# Patient Record
Sex: Male | Born: 1940 | Race: White | Hispanic: No | Marital: Married | Smoking: Never smoker
Health system: Southern US, Community
[De-identification: ages and names within clinical notes are randomized; demographics above are authoritative.]

## PROBLEM LIST (undated history)

## (undated) DIAGNOSIS — Z923 Personal history of irradiation: Secondary | ICD-10-CM

## (undated) DIAGNOSIS — C801 Malignant (primary) neoplasm, unspecified: Secondary | ICD-10-CM

## (undated) DIAGNOSIS — E669 Obesity, unspecified: Secondary | ICD-10-CM

## (undated) DIAGNOSIS — E119 Type 2 diabetes mellitus without complications: Secondary | ICD-10-CM

## (undated) DIAGNOSIS — M1990 Unspecified osteoarthritis, unspecified site: Secondary | ICD-10-CM

## (undated) DIAGNOSIS — I2510 Atherosclerotic heart disease of native coronary artery without angina pectoris: Secondary | ICD-10-CM

## (undated) DIAGNOSIS — I10 Essential (primary) hypertension: Secondary | ICD-10-CM

## (undated) DIAGNOSIS — I219 Acute myocardial infarction, unspecified: Secondary | ICD-10-CM

## (undated) DIAGNOSIS — R9431 Abnormal electrocardiogram [ECG] [EKG]: Secondary | ICD-10-CM

## (undated) DIAGNOSIS — G4730 Sleep apnea, unspecified: Secondary | ICD-10-CM

## (undated) DIAGNOSIS — C679 Malignant neoplasm of bladder, unspecified: Secondary | ICD-10-CM

## (undated) DIAGNOSIS — Z9221 Personal history of antineoplastic chemotherapy: Secondary | ICD-10-CM

## (undated) DIAGNOSIS — Z8619 Personal history of other infectious and parasitic diseases: Secondary | ICD-10-CM

## (undated) DIAGNOSIS — E785 Hyperlipidemia, unspecified: Secondary | ICD-10-CM

## (undated) HISTORY — PX: OTHER SURGICAL HISTORY: SHX169

## (undated) HISTORY — PX: HERNIA REPAIR: SHX51

## (undated) HISTORY — DX: Personal history of other infectious and parasitic diseases: Z86.19

## (undated) HISTORY — PX: APPENDECTOMY: SHX54

## (undated) HISTORY — DX: Malignant neoplasm of bladder, unspecified: C67.9

## (undated) HISTORY — PX: ROTATOR CUFF REPAIR: SHX139

---

## 2003-12-06 HISTORY — PX: CARDIAC CATHETERIZATION: SHX172

## 2005-04-24 ENCOUNTER — Ambulatory Visit: Payer: Self-pay | Admitting: Cardiovascular Disease

## 2007-08-13 ENCOUNTER — Inpatient Hospital Stay: Payer: Self-pay | Admitting: Cardiovascular Disease

## 2007-08-13 ENCOUNTER — Other Ambulatory Visit: Payer: Self-pay

## 2008-07-26 DIAGNOSIS — I10 Essential (primary) hypertension: Secondary | ICD-10-CM | POA: Insufficient documentation

## 2008-07-26 DIAGNOSIS — R209 Unspecified disturbances of skin sensation: Secondary | ICD-10-CM | POA: Insufficient documentation

## 2008-07-26 DIAGNOSIS — G9009 Other idiopathic peripheral autonomic neuropathy: Secondary | ICD-10-CM | POA: Insufficient documentation

## 2008-07-31 DIAGNOSIS — N400 Enlarged prostate without lower urinary tract symptoms: Secondary | ICD-10-CM | POA: Insufficient documentation

## 2008-07-31 DIAGNOSIS — I2510 Atherosclerotic heart disease of native coronary artery without angina pectoris: Secondary | ICD-10-CM | POA: Insufficient documentation

## 2008-08-01 DIAGNOSIS — E1129 Type 2 diabetes mellitus with other diabetic kidney complication: Secondary | ICD-10-CM | POA: Insufficient documentation

## 2008-08-18 ENCOUNTER — Ambulatory Visit: Payer: Self-pay | Admitting: Family Medicine

## 2008-08-31 DIAGNOSIS — IMO0002 Reserved for concepts with insufficient information to code with codable children: Secondary | ICD-10-CM | POA: Insufficient documentation

## 2009-06-08 DIAGNOSIS — N529 Male erectile dysfunction, unspecified: Secondary | ICD-10-CM | POA: Insufficient documentation

## 2010-08-30 ENCOUNTER — Ambulatory Visit: Payer: Self-pay | Admitting: Urology

## 2010-11-01 ENCOUNTER — Ambulatory Visit: Payer: Self-pay | Admitting: Gastroenterology

## 2010-11-01 DIAGNOSIS — Z86010 Personal history of colonic polyps: Secondary | ICD-10-CM | POA: Insufficient documentation

## 2010-11-01 LAB — HM COLONOSCOPY

## 2010-11-05 LAB — PATHOLOGY REPORT

## 2010-11-17 DIAGNOSIS — Z923 Personal history of irradiation: Secondary | ICD-10-CM

## 2010-11-17 HISTORY — DX: Personal history of irradiation: Z92.3

## 2010-11-17 HISTORY — PX: OTHER SURGICAL HISTORY: SHX169

## 2011-07-16 ENCOUNTER — Ambulatory Visit: Payer: Self-pay | Admitting: Family Medicine

## 2012-02-04 ENCOUNTER — Ambulatory Visit: Payer: Self-pay | Admitting: Orthopedic Surgery

## 2012-02-26 ENCOUNTER — Ambulatory Visit: Payer: Self-pay | Admitting: Orthopedic Surgery

## 2012-02-26 LAB — BASIC METABOLIC PANEL
Anion Gap: 8 (ref 7–16)
BUN: 17 mg/dL (ref 7–18)
Calcium, Total: 9.2 mg/dL (ref 8.5–10.1)
Co2: 26 mmol/L (ref 21–32)
Creatinine: 0.86 mg/dL (ref 0.60–1.30)
Glucose: 240 mg/dL — ABNORMAL HIGH (ref 65–99)
Osmolality: 289 (ref 275–301)
Potassium: 4 mmol/L (ref 3.5–5.1)

## 2012-02-26 LAB — PROTIME-INR
INR: 0.9
Prothrombin Time: 12.5 secs (ref 11.5–14.7)

## 2012-02-26 LAB — CBC
HGB: 15.2 g/dL (ref 13.0–18.0)
MCV: 89 fL (ref 80–100)
Platelet: 185 10*3/uL (ref 150–440)
RBC: 5.14 10*6/uL (ref 4.40–5.90)

## 2012-03-04 ENCOUNTER — Ambulatory Visit: Payer: Self-pay | Admitting: Orthopedic Surgery

## 2012-10-25 ENCOUNTER — Inpatient Hospital Stay: Payer: Self-pay | Admitting: Internal Medicine

## 2012-10-25 LAB — TROPONIN I
Troponin-I: 0.04 ng/mL
Troponin-I: 0.19 ng/mL — ABNORMAL HIGH
Troponin-I: 0.1 ng/mL — ABNORMAL HIGH

## 2012-10-25 LAB — BASIC METABOLIC PANEL
BUN: 21 mg/dL — ABNORMAL HIGH (ref 7–18)
Creatinine: 0.76 mg/dL (ref 0.60–1.30)
EGFR (African American): >60
EGFR (Non-African Amer.): >60
Glucose: 279 mg/dL — ABNORMAL HIGH (ref 65–99)
Potassium: 4 mmol/L (ref 3.5–5.1)
Sodium: 139 mmol/L (ref 136–145)

## 2012-10-25 LAB — CK TOTAL AND CKMB (NOT AT ARMC)
CK, Total: 156 U/L (ref 35–232)
CK, Total: 117 U/L (ref 35–232)
CK, Total: 95 U/L (ref 35–232)
CK-MB: 7.1 ng/mL — ABNORMAL HIGH (ref 0.5–3.6)
CK-MB: 6.3 ng/mL — ABNORMAL HIGH (ref 0.5–3.6)
CK-MB: 5.8 ng/mL — ABNORMAL HIGH (ref 0.5–3.6)

## 2012-10-25 LAB — CBC
HGB: 15.7 g/dL (ref 13.0–18.0)
MCH: 29.5 pg (ref 26.0–34.0)
MCHC: 33.7 g/dL (ref 32.0–36.0)
Platelet: 177 10*3/uL (ref 150–440)
RBC: 5.33 10*6/uL (ref 4.40–5.90)
WBC: 5.8 10*3/uL (ref 3.8–10.6)

## 2012-10-25 LAB — PROTIME-INR
INR: 1
INR: 1
Prothrombin Time: 13.1 secs (ref 11.5–14.7)

## 2012-10-26 LAB — LIPID PANEL
HDL Cholesterol: 36 mg/dL — ABNORMAL LOW (ref 40–60)
Ldl Cholesterol, Calc: 41 mg/dL (ref 0–100)
Triglycerides: 284 mg/dL — ABNORMAL HIGH (ref 0–200)

## 2012-10-26 LAB — CBC WITH DIFFERENTIAL/PLATELET
Basophil #: 0 10*3/uL (ref 0.0–0.1)
Eosinophil %: 3.1 %
HGB: 14.9 g/dL (ref 13.0–18.0)
Lymphocyte %: 18.3 %
MCH: 29.7 pg (ref 26.0–34.0)
MCHC: 34.1 g/dL (ref 32.0–36.0)
MCV: 87 fL (ref 80–100)
Monocyte %: 11.4 %
Neutrophil %: 66.9 %
Platelet: 168 10*3/uL (ref 150–440)
RBC: 5.01 10*6/uL (ref 4.40–5.90)

## 2012-10-26 LAB — COMPREHENSIVE METABOLIC PANEL
Albumin: 3.3 g/dL — ABNORMAL LOW (ref 3.4–5.0)
Alkaline Phosphatase: 91 U/L (ref 50–136)
Anion Gap: 7 (ref 7–16)
BUN: 13 mg/dL (ref 7–18)
Bilirubin,Total: 0.9 mg/dL (ref 0.2–1.0)
Chloride: 106 mmol/L (ref 98–107)
Co2: 27 mmol/L (ref 21–32)
Creatinine: 0.84 mg/dL (ref 0.60–1.30)
EGFR (Non-African Amer.): >60
Glucose: 160 mg/dL — ABNORMAL HIGH (ref 65–99)
Potassium: 3.9 mmol/L (ref 3.5–5.1)
SGPT (ALT): 38 U/L (ref 12–78)
Sodium: 140 mmol/L (ref 136–145)
Total Protein: 5.9 g/dL — ABNORMAL LOW (ref 6.4–8.2)

## 2012-11-17 DIAGNOSIS — Z9221 Personal history of antineoplastic chemotherapy: Secondary | ICD-10-CM

## 2012-11-17 HISTORY — PX: OTHER SURGICAL HISTORY: SHX169

## 2012-11-17 HISTORY — DX: Personal history of antineoplastic chemotherapy: Z92.21

## 2013-01-11 ENCOUNTER — Emergency Department: Payer: Self-pay | Admitting: Unknown Physician Specialty

## 2013-01-11 LAB — URINALYSIS, COMPLETE
RBC,UR: 11078 /HPF (ref 0–5)
Specific Gravity: 1.031 (ref 1.003–1.030)
WBC UR: 2727 /HPF (ref 0–5)

## 2013-01-11 LAB — CBC
HCT: 41 % (ref 40.0–52.0)
HGB: 13.9 g/dL (ref 13.0–18.0)
MCH: 29.1 pg (ref 26.0–34.0)
MCHC: 33.9 g/dL (ref 32.0–36.0)
MCV: 86 fL (ref 80–100)
RDW: 13.8 % (ref 11.5–14.5)

## 2013-01-11 LAB — BASIC METABOLIC PANEL
Anion Gap: 11 (ref 7–16)
BUN: 23 mg/dL — ABNORMAL HIGH (ref 7–18)
Calcium, Total: 9.2 mg/dL (ref 8.5–10.1)
Creatinine: 1.14 mg/dL (ref 0.60–1.30)
Glucose: 333 mg/dL — ABNORMAL HIGH (ref 65–99)
Osmolality: 296 (ref 275–301)
Sodium: 140 mmol/L (ref 136–145)

## 2013-01-13 LAB — URINE CULTURE

## 2013-01-14 DIAGNOSIS — C61 Malignant neoplasm of prostate: Secondary | ICD-10-CM | POA: Insufficient documentation

## 2013-01-14 DIAGNOSIS — R310 Gross hematuria: Secondary | ICD-10-CM | POA: Insufficient documentation

## 2013-01-14 DIAGNOSIS — R339 Retention of urine, unspecified: Secondary | ICD-10-CM | POA: Insufficient documentation

## 2013-01-14 DIAGNOSIS — R300 Dysuria: Secondary | ICD-10-CM | POA: Insufficient documentation

## 2013-01-16 LAB — CULTURE, BLOOD (SINGLE)

## 2013-01-20 ENCOUNTER — Ambulatory Visit: Payer: Self-pay | Admitting: Urology

## 2013-01-20 LAB — BASIC METABOLIC PANEL
Anion Gap: 4 — ABNORMAL LOW (ref 7–16)
Calcium, Total: 8.8 mg/dL (ref 8.5–10.1)
Co2: 30 mmol/L (ref 21–32)
EGFR (African American): >60
Osmolality: 285 (ref 275–301)

## 2013-01-25 ENCOUNTER — Ambulatory Visit: Payer: Self-pay | Admitting: Urology

## 2013-01-28 DIAGNOSIS — C672 Malignant neoplasm of lateral wall of bladder: Secondary | ICD-10-CM | POA: Insufficient documentation

## 2013-06-13 ENCOUNTER — Ambulatory Visit: Payer: Self-pay | Admitting: Oncology

## 2013-06-16 LAB — COMPREHENSIVE METABOLIC PANEL
Albumin: 3.7 g/dL (ref 3.4–5.0)
Alkaline Phosphatase: 114 U/L (ref 50–136)
Anion Gap: 7 (ref 7–16)
BUN: 19 mg/dL — ABNORMAL HIGH (ref 7–18)
Calcium, Total: 9.4 mg/dL (ref 8.5–10.1)
Chloride: 103 mmol/L (ref 98–107)
Co2: 28 mmol/L (ref 21–32)
Creatinine: 1.15 mg/dL (ref 0.60–1.30)
EGFR (Non-African Amer.): >60
SGOT(AST): 19 U/L (ref 15–37)
SGPT (ALT): 33 U/L (ref 12–78)
Total Protein: 7.3 g/dL (ref 6.4–8.2)

## 2013-06-16 LAB — CBC CANCER CENTER
Basophil %: 0.7 %
Eosinophil %: 5.4 %
HGB: 13.7 g/dL (ref 13.0–18.0)
Lymphocyte %: 26.8 %
MCH: 27.8 pg (ref 26.0–34.0)
MCHC: 34.1 g/dL (ref 32.0–36.0)
Neutrophil %: 56.5 %
Platelet: 227 x10 3/mm (ref 150–440)
RDW: 16.9 % — ABNORMAL HIGH (ref 11.5–14.5)

## 2013-06-17 ENCOUNTER — Ambulatory Visit: Payer: Self-pay | Admitting: Oncology

## 2013-06-23 LAB — COMPREHENSIVE METABOLIC PANEL
Alkaline Phosphatase: 114 U/L (ref 50–136)
BUN: 22 mg/dL — ABNORMAL HIGH (ref 7–18)
Bilirubin,Total: 0.4 mg/dL (ref 0.2–1.0)
Creatinine: 0.98 mg/dL (ref 0.60–1.30)
EGFR (African American): >60
EGFR (Non-African Amer.): >60
Glucose: 253 mg/dL — ABNORMAL HIGH (ref 65–99)
SGPT (ALT): 32 U/L (ref 12–78)
Sodium: 138 mmol/L (ref 136–145)
Total Protein: 6.9 g/dL (ref 6.4–8.2)

## 2013-06-23 LAB — CBC CANCER CENTER
Basophil #: 0 x10 3/mm (ref 0.0–0.1)
Basophil %: 0.6 %
HCT: 38 % — ABNORMAL LOW (ref 40.0–52.0)
HGB: 13.3 g/dL (ref 13.0–18.0)
Lymphocyte #: 1.3 x10 3/mm (ref 1.0–3.6)
MCH: 28.4 pg (ref 26.0–34.0)
Monocyte %: 6.8 %
Neutrophil #: 2.3 x10 3/mm (ref 1.4–6.5)
Neutrophil %: 56.6 %
Platelet: 163 x10 3/mm (ref 150–440)

## 2013-07-07 LAB — CBC CANCER CENTER
Basophil %: 0.6 %
Eosinophil %: 3.3 %
HGB: 12.4 g/dL — ABNORMAL LOW (ref 13.0–18.0)
Monocyte #: 0.5 x10 3/mm (ref 0.2–1.0)
Neutrophil %: 53.9 %
RBC: 4.37 10*6/uL — ABNORMAL LOW (ref 4.40–5.90)

## 2013-07-07 LAB — COMPREHENSIVE METABOLIC PANEL
Anion Gap: 3 — ABNORMAL LOW (ref 7–16)
BUN: 15 mg/dL (ref 7–18)
Bilirubin,Total: 0.2 mg/dL (ref 0.2–1.0)
Calcium, Total: 8.8 mg/dL (ref 8.5–10.1)
Co2: 29 mmol/L (ref 21–32)
EGFR (Non-African Amer.): >60
Glucose: 282 mg/dL — ABNORMAL HIGH (ref 65–99)
Osmolality: 283 (ref 275–301)
Potassium: 4.4 mmol/L (ref 3.5–5.1)
SGOT(AST): 21 U/L (ref 15–37)
SGPT (ALT): 32 U/L (ref 12–78)
Total Protein: 6.4 g/dL (ref 6.4–8.2)

## 2013-07-14 LAB — COMPREHENSIVE METABOLIC PANEL
Alkaline Phosphatase: 116 U/L (ref 50–136)
Anion Gap: 5 — ABNORMAL LOW (ref 7–16)
BUN: 16 mg/dL (ref 7–18)
Calcium, Total: 9.1 mg/dL (ref 8.5–10.1)
Chloride: 101 mmol/L (ref 98–107)
Creatinine: 1.02 mg/dL (ref 0.60–1.30)
EGFR (Non-African Amer.): >60
Glucose: 292 mg/dL — ABNORMAL HIGH (ref 65–99)
Potassium: 4.8 mmol/L (ref 3.5–5.1)
SGOT(AST): 14 U/L — ABNORMAL LOW (ref 15–37)
Sodium: 136 mmol/L (ref 136–145)
Total Protein: 6.1 g/dL — ABNORMAL LOW (ref 6.4–8.2)

## 2013-07-14 LAB — CBC CANCER CENTER
Eosinophil #: 0.1 x10 3/mm (ref 0.0–0.7)
Eosinophil %: 2 %
HCT: 34.7 % — ABNORMAL LOW (ref 40.0–52.0)
HGB: 12.2 g/dL — ABNORMAL LOW (ref 13.0–18.0)
MCH: 28.5 pg (ref 26.0–34.0)
MCHC: 35.2 g/dL (ref 32.0–36.0)
MCV: 81 fL (ref 80–100)
Monocyte #: 0.3 x10 3/mm (ref 0.2–1.0)
Neutrophil %: 71 %
Platelet: 324 x10 3/mm (ref 150–440)
RBC: 4.29 10*6/uL — ABNORMAL LOW (ref 4.40–5.90)
WBC: 5.5 x10 3/mm (ref 3.8–10.6)

## 2013-07-18 ENCOUNTER — Ambulatory Visit: Payer: Self-pay | Admitting: Oncology

## 2013-07-28 LAB — COMPREHENSIVE METABOLIC PANEL
Albumin: 3.2 g/dL — ABNORMAL LOW (ref 3.4–5.0)
Alkaline Phosphatase: 119 U/L (ref 50–136)
Anion Gap: 9 (ref 7–16)
BUN: 14 mg/dL (ref 7–18)
Bilirubin,Total: 0.3 mg/dL (ref 0.2–1.0)
Calcium, Total: 9.1 mg/dL (ref 8.5–10.1)
Chloride: 102 mmol/L (ref 98–107)
Co2: 28 mmol/L (ref 21–32)
Creatinine: 1 mg/dL (ref 0.60–1.30)
EGFR (Non-African Amer.): >60
Glucose: 219 mg/dL — ABNORMAL HIGH (ref 65–99)
Osmolality: 285 (ref 275–301)
Potassium: 4.7 mmol/L (ref 3.5–5.1)
SGOT(AST): 16 U/L (ref 15–37)
Sodium: 139 mmol/L (ref 136–145)
Total Protein: 6.3 g/dL — ABNORMAL LOW (ref 6.4–8.2)

## 2013-07-28 LAB — CBC CANCER CENTER
Basophil #: 0 x10 3/mm (ref 0.0–0.1)
Eosinophil #: 0.1 x10 3/mm (ref 0.0–0.7)
Eosinophil %: 2 %
HGB: 12.1 g/dL — ABNORMAL LOW (ref 13.0–18.0)
Lymphocyte #: 1.6 x10 3/mm (ref 1.0–3.6)
MCH: 28.2 pg (ref 26.0–34.0)
MCHC: 33.6 g/dL (ref 32.0–36.0)
MCV: 84 fL (ref 80–100)
Neutrophil %: 55 %
RBC: 4.28 10*6/uL — ABNORMAL LOW (ref 4.40–5.90)
RDW: 17.7 % — ABNORMAL HIGH (ref 11.5–14.5)

## 2013-08-04 LAB — COMPREHENSIVE METABOLIC PANEL
Albumin: 3.3 g/dL — ABNORMAL LOW (ref 3.4–5.0)
Alkaline Phosphatase: 115 U/L (ref 50–136)
Calcium, Total: 9.4 mg/dL (ref 8.5–10.1)
Co2: 27 mmol/L (ref 21–32)
EGFR (African American): >60
EGFR (Non-African Amer.): >60
Glucose: 208 mg/dL — ABNORMAL HIGH (ref 65–99)
Osmolality: 281 (ref 275–301)
Potassium: 4.4 mmol/L (ref 3.5–5.1)
SGOT(AST): 18 U/L (ref 15–37)
SGPT (ALT): 31 U/L (ref 12–78)
Sodium: 136 mmol/L (ref 136–145)
Total Protein: 6.6 g/dL (ref 6.4–8.2)

## 2013-08-04 LAB — CBC CANCER CENTER
Basophil #: 0 x10 3/mm (ref 0.0–0.1)
Basophil %: 0.8 %
Eosinophil #: 0.1 x10 3/mm (ref 0.0–0.7)
Eosinophil %: 1.7 %
HCT: 35.6 % — ABNORMAL LOW (ref 40.0–52.0)
MCHC: 33.6 g/dL (ref 32.0–36.0)
MCV: 85 fL (ref 80–100)
Monocyte %: 12.7 %
Neutrophil %: 58.2 %
Platelet: 226 x10 3/mm (ref 150–440)

## 2013-08-17 ENCOUNTER — Ambulatory Visit: Payer: Self-pay | Admitting: Oncology

## 2013-08-18 LAB — CBC CANCER CENTER
Basophil %: 0.7 %
Eosinophil #: 0.2 x10 3/mm (ref 0.0–0.7)
Eosinophil %: 3.7 %
HGB: 10.9 g/dL — ABNORMAL LOW (ref 13.0–18.0)
Lymphocyte #: 1.1 x10 3/mm (ref 1.0–3.6)
MCH: 29.6 pg (ref 26.0–34.0)
MCHC: 33.4 g/dL (ref 32.0–36.0)
MCV: 89 fL (ref 80–100)
Monocyte #: 0.9 x10 3/mm (ref 0.2–1.0)
Neutrophil %: 61 %
RDW: 21.7 % — ABNORMAL HIGH (ref 11.5–14.5)
WBC: 5.7 x10 3/mm (ref 3.8–10.6)

## 2013-08-18 LAB — COMPREHENSIVE METABOLIC PANEL
Alkaline Phosphatase: 107 U/L (ref 50–136)
BUN: 16 mg/dL (ref 7–18)
Bilirubin,Total: 0.2 mg/dL (ref 0.2–1.0)
Calcium, Total: 8.2 mg/dL — ABNORMAL LOW (ref 8.5–10.1)
Chloride: 103 mmol/L (ref 98–107)
EGFR (African American): >60
EGFR (Non-African Amer.): >60
SGPT (ALT): 27 U/L (ref 12–78)

## 2013-08-25 LAB — COMPREHENSIVE METABOLIC PANEL
Albumin: 3.2 g/dL — ABNORMAL LOW (ref 3.4–5.0)
Anion Gap: 9 (ref 7–16)
BUN: 13 mg/dL (ref 7–18)
Bilirubin,Total: 0.3 mg/dL (ref 0.2–1.0)
Calcium, Total: 8.3 mg/dL — ABNORMAL LOW (ref 8.5–10.1)
EGFR (African American): >60
EGFR (Non-African Amer.): >60
Glucose: 375 mg/dL — ABNORMAL HIGH (ref 65–99)
Potassium: 4.9 mmol/L (ref 3.5–5.1)
SGPT (ALT): 35 U/L (ref 12–78)
Sodium: 134 mmol/L — ABNORMAL LOW (ref 136–145)
Total Protein: 6.4 g/dL (ref 6.4–8.2)

## 2013-08-25 LAB — CBC CANCER CENTER
Basophil #: 0.1 x10 3/mm (ref 0.0–0.1)
Basophil %: 1.1 %
Eosinophil #: 0.1 x10 3/mm (ref 0.0–0.7)
HGB: 12.3 g/dL — ABNORMAL LOW (ref 13.0–18.0)
Lymphocyte %: 18.6 %
MCHC: 33.7 g/dL (ref 32.0–36.0)
Monocyte #: 0.4 x10 3/mm (ref 0.2–1.0)
Monocyte %: 7.4 %
Neutrophil #: 3.8 x10 3/mm (ref 1.4–6.5)
Neutrophil %: 70.5 %
RDW: 20.9 % — ABNORMAL HIGH (ref 11.5–14.5)
WBC: 5.5 x10 3/mm (ref 3.8–10.6)

## 2013-09-17 ENCOUNTER — Ambulatory Visit: Payer: Self-pay | Admitting: Oncology

## 2013-10-17 ENCOUNTER — Ambulatory Visit: Payer: Self-pay | Admitting: Oncology

## 2013-10-20 LAB — TSH: TSH: 2.28 u[IU]/mL (ref <=5.90)

## 2013-12-19 ENCOUNTER — Encounter (HOSPITAL_COMMUNITY): Payer: Self-pay | Admitting: Pharmacy Technician

## 2013-12-19 NOTE — Progress Notes (Signed)
12-12-2013 Last office visit and EKG from Dr. Humphrey Rolls on chart 12-14-2013 Nuclear stress test  From Dr. Humphrey Rolls on chart 02-02-2015[name], [place]

## 2013-12-20 ENCOUNTER — Encounter (HOSPITAL_COMMUNITY): Payer: Self-pay

## 2013-12-20 ENCOUNTER — Other Ambulatory Visit (HOSPITAL_COMMUNITY): Payer: Self-pay | Admitting: Orthopedic Surgery

## 2013-12-20 NOTE — Patient Instructions (Addendum)
[place]  12/20/2013   Your procedure is scheduled on: Tuesday February 10th  Report to [place] at 73 AM.  Call this number if you have problems the morning of surgery [phone]   Remember: NO VISITORS UNDER AGE 72 PER [place].   Do not eat food :After Midnight.   clear liquids midnight until 735 am day of surgery, then nothing by mouth.   Take these medicines the morning of surgery with A SIP OF WATER: atenolol                                [place]             You may not have any metal on your body including hair pins and piercings  Do not wear jewelry, make-up.  Do not wear lotions, powders, or perfumes. No  Deodorant is to be worn.   Men may shave face and neck.  Do not bring valuables to the hospital. [place].  Contacts, dentures or bridgework may not be worn into surgery.  Leave suitcase in the car. After surgery it may be brought to your room.  For patients admitted to the hospital, checkout time is 11:00 AM the day of discharge.     Please read over the following fact sheets that you were given: [facility] Preparing for surgery sheet, clear liquid sheet, MRSA information, incentive spirometer sheet, blood fact sheet  Call Zelphia Cairo RN pre op nurse if needed [phone]    [place].  PATIENT SIGNATURE___________________________________________  NURSE SIGNATURE_____________________________________________

## 2013-12-20 NOTE — Progress Notes (Signed)
Oncology clearance note dr Grayland Ormond 10-10-2013 on chart ekg 12-12-2013 dr Humphrey Rolls on chart lov note dr Humphrey Rolls cardiology 12-12-2013 on chart Nuclear stress and cardiac clearance note 12-14-2013 dr Humphrey Rolls on chart

## 2013-12-21 ENCOUNTER — Ambulatory Visit (HOSPITAL_COMMUNITY)
Admission: RE | Admit: 2013-12-21 | Discharge: 2013-12-21 | Disposition: A | Discharge status: Home / Self Care | Payer: Medicare PPO | Source: Ambulatory Visit | Attending: Orthopedic Surgery | Admitting: Orthopedic Surgery

## 2013-12-21 ENCOUNTER — Encounter (HOSPITAL_COMMUNITY)
Admission: RE | Admit: 2013-12-21 | Discharge: 2013-12-21 | Disposition: A | Discharge status: Home / Self Care | Payer: Medicare PPO | Source: Ambulatory Visit | Attending: Orthopedic Surgery | Admitting: Orthopedic Surgery

## 2013-12-21 ENCOUNTER — Encounter (HOSPITAL_COMMUNITY): Payer: Self-pay

## 2013-12-21 DIAGNOSIS — I517 Cardiomegaly: Secondary | ICD-10-CM | POA: Insufficient documentation

## 2013-12-21 DIAGNOSIS — X58XXXA Exposure to other specified factors, initial encounter: Secondary | ICD-10-CM | POA: Insufficient documentation

## 2013-12-21 DIAGNOSIS — C61 Malignant neoplasm of prostate: Secondary | ICD-10-CM | POA: Insufficient documentation

## 2013-12-21 DIAGNOSIS — Z01812 Encounter for preprocedural laboratory examination: Secondary | ICD-10-CM | POA: Insufficient documentation

## 2013-12-21 DIAGNOSIS — T17508A Unspecified foreign body in bronchus causing other injury, initial encounter: Secondary | ICD-10-CM

## 2013-12-21 DIAGNOSIS — M1610 Unilateral primary osteoarthritis, unspecified hip: Secondary | ICD-10-CM | POA: Insufficient documentation

## 2013-12-21 DIAGNOSIS — E119 Type 2 diabetes mellitus without complications: Secondary | ICD-10-CM | POA: Insufficient documentation

## 2013-12-21 DIAGNOSIS — I10 Essential (primary) hypertension: Secondary | ICD-10-CM | POA: Insufficient documentation

## 2013-12-21 DIAGNOSIS — T17808A Unspecified foreign body in other parts of respiratory tract causing other injury, initial encounter: Secondary | ICD-10-CM

## 2013-12-21 DIAGNOSIS — I771 Stricture of artery: Secondary | ICD-10-CM | POA: Insufficient documentation

## 2013-12-21 DIAGNOSIS — M169 Osteoarthritis of hip, unspecified: Secondary | ICD-10-CM | POA: Insufficient documentation

## 2013-12-21 DIAGNOSIS — Z01818 Encounter for other preprocedural examination: Secondary | ICD-10-CM | POA: Insufficient documentation

## 2013-12-21 DIAGNOSIS — I252 Old myocardial infarction: Secondary | ICD-10-CM | POA: Insufficient documentation

## 2013-12-21 DIAGNOSIS — T17408A Unspecified foreign body in trachea causing other injury, initial encounter: Secondary | ICD-10-CM | POA: Insufficient documentation

## 2013-12-21 DIAGNOSIS — I2510 Atherosclerotic heart disease of native coronary artery without angina pectoris: Secondary | ICD-10-CM | POA: Insufficient documentation

## 2013-12-21 HISTORY — DX: Personal history of irradiation: Z92.3

## 2013-12-21 HISTORY — DX: Atherosclerotic heart disease of native coronary artery without angina pectoris: I25.10

## 2013-12-21 HISTORY — DX: Malignant (primary) neoplasm, unspecified: C80.1

## 2013-12-21 HISTORY — DX: Abnormal electrocardiogram (ECG) (EKG): R94.31

## 2013-12-21 HISTORY — DX: Personal history of antineoplastic chemotherapy: Z92.21

## 2013-12-21 HISTORY — DX: Sleep apnea, unspecified: G47.30

## 2013-12-21 HISTORY — DX: Essential (primary) hypertension: I10

## 2013-12-21 HISTORY — DX: Type 2 diabetes mellitus without complications: E11.9

## 2013-12-21 HISTORY — DX: Hyperlipidemia, unspecified: E78.5

## 2013-12-21 HISTORY — DX: Acute myocardial infarction, unspecified: I21.9

## 2013-12-21 HISTORY — DX: Obesity, unspecified: E66.9

## 2013-12-21 HISTORY — DX: Unspecified osteoarthritis, unspecified site: M19.90

## 2013-12-21 LAB — ABO/RH: ABO/RH(D): O NEG

## 2013-12-21 LAB — URINALYSIS, ROUTINE W REFLEX MICROSCOPIC
BILIRUBIN URINE: NEGATIVE
Glucose, UA: >1000 mg/dL — AB
Ketones, ur: NEGATIVE mg/dL
Nitrite: POSITIVE — AB
Protein, ur: NEGATIVE mg/dL
Specific Gravity, Urine: 1.022 (ref 1.005–1.030)
UROBILINOGEN UA: 0.2 mg/dL (ref 0.0–1.0)
pH: 7.5 (ref 5.0–8.0)

## 2013-12-21 LAB — CBC
HEMATOCRIT: 43.7 % (ref 39.0–52.0)
HEMOGLOBIN: 14.8 g/dL (ref 13.0–17.0)
MCH: 28.5 pg (ref 26.0–34.0)
MCHC: 33.9 g/dL (ref 30.0–36.0)
MCV: 84.2 fL (ref 78.0–100.0)
PLATELETS: 185 10*3/uL (ref 150–400)
RBC: 5.19 MIL/uL (ref 4.22–5.81)
RDW: 13.2 % (ref 11.5–15.5)
WBC: 6.2 10*3/uL (ref 4.0–10.5)

## 2013-12-21 LAB — URINE MICROSCOPIC-ADD ON

## 2013-12-21 LAB — BASIC METABOLIC PANEL
BUN: 17 mg/dL (ref 6–23)
CALCIUM: 9.4 mg/dL (ref 8.4–10.5)
CHLORIDE: 100 meq/L (ref 96–112)
CO2: 25 meq/L (ref 19–32)
Creatinine, Ser: 0.89 mg/dL (ref 0.50–1.35)
GFR calc Af Amer: >90 mL/min (ref >90)
GFR calc non Af Amer: 84 mL/min — ABNORMAL LOW (ref >90)
Glucose, Bld: 310 mg/dL — ABNORMAL HIGH (ref 70–99)
Potassium: 4.8 mEq/L (ref 3.7–5.3)
SODIUM: 137 meq/L (ref 137–147)

## 2013-12-21 LAB — SURGICAL PCR SCREEN
MRSA, PCR: NEGATIVE
Staphylococcus aureus: NEGATIVE

## 2013-12-21 LAB — PROTIME-INR
INR: 0.92 (ref 0.00–1.49)
Prothrombin Time: 12.2 seconds (ref 11.6–15.2)

## 2013-12-21 LAB — APTT: aPTT: 26 seconds (ref 24–37)

## 2013-12-21 NOTE — H&P (Signed)
TOTAL HIP ADMISSION H&P  Patient is admitted for right total hip arthroplasty, anterior approach.  Subjective:  Chief Complaint:    Right hip OA / pain  HPI: Louis Alexander, 73 y.o. male, has a history of pain and functional disability in the right hip(s) due to arthritis and patient has failed non-surgical conservative treatments for greater than 12 weeks to include NSAID's and/or analgesics and activity modification.  Onset of symptoms was gradual starting 1+ years ago with gradually worsening course since that time.The patient noted no past surgery on the right hip(s).  Patient currently rates pain in the right hip at 8 out of 10 with activity. Patient has worsening of pain with activity and weight bearing, trendelenberg gait, pain that interfers with activities of daily living and pain with passive range of motion. Patient has evidence of periarticular osteophytes and joint space narrowing by imaging studies. This condition presents safety issues increasing the risk of falls.  There is no current active infection.   Risks, benefits and expectations were discussed with the patient.  Risks including but not limited to the risk of anesthesia, blood clots, nerve damage, blood vessel damage, failure of the prosthesis, infection and up to and including death.  Patient understand the risks, benefits and expectations and wishes to proceed with surgery.   D/C Plans:   Home with HHPT  Post-op Meds:    No Rx given  Tranexamic Acid:   Not to be given - CAD  Decadron:    Not to be given - DM  FYI:    Plaviix and ASA post-op  Norco post-op  Past Medical History  Diagnosis Date  . Abnormal EKG   . Coronary artery disease   . Diabetes mellitus without complication   . Obesity   . Hyperlipidemia   . Hypertension   . 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 Sleep apnea     could not tolerate cpap mask  . Arthritis     oa  . Cancer     bladder and prostate  . History of radiation therapy  2012  . History of chemotherapy 2014    Past Surgical History  Procedure Laterality Date  . Cardiac catheterization  12-06-2003  . Lad stent  2000 x1 stent, 2008 x 1 stent, 2012 x 1 stent  . Ptca  2001  . Hernia repair  yrs ago  . Appendectomy  many yrs ago  . Rotator cuff repair Left yrs ago  . Seed implant to prostate with radiation  2012  . Protatectomy and urostomy  2014    No prescriptions prior to admission   Allergies  Allergen Reactions  . Celebrex [Celecoxib]     Bleeding in bladder  . Effient [Prasugrel] Other (See Comments)    Bleeding in bladder    History  Substance Use Topics  . Smoking status: Never Smoker   . Smokeless tobacco: Never Used  . Alcohol Use: Yes     Comment: occasional    No family history on file.   Review of Systems  Constitutional: Negative.   HENT: Negative.   Eyes: Negative.   Respiratory: Negative.   Cardiovascular: Negative.   Gastrointestinal: Negative.   Genitourinary: Negative.   Musculoskeletal: Positive for joint pain.  Skin: Negative.   Neurological: Negative.   Endo/Heme/Allergies: Negative.   Psychiatric/Behavioral: Negative.     Objective:  Physical Exam  Constitutional: He is oriented to person, place, and time. He appears well-developed and well-nourished.  HENT:  Head: Normocephalic and atraumatic.  Mouth/Throat: Oropharynx is clear and moist.  Eyes: Pupils are equal, round, and reactive to light.  Neck: Neck supple. No JVD present. No tracheal deviation present. No thyromegaly present.  Cardiovascular: Normal rate, regular rhythm, normal heart sounds and intact distal pulses.   Respiratory: Effort normal and breath sounds normal. No stridor. No respiratory distress. He has no wheezes.  GI: Soft. There is no tenderness. There is no guarding.  Musculoskeletal:       Right hip: He exhibits decreased range of motion, decreased strength, tenderness and bony tenderness. He exhibits no swelling, no deformity and no  laceration.  Lymphadenopathy:    He has no cervical adenopathy.  Neurological: He is alert and oriented to person, place, and time.  Skin: Skin is warm and dry.  Psychiatric: He has a normal mood and affect.    Vital signs in last 24 hours: Temp:  [98.1 F (36.7 C)] 98.1 F (36.7 C) (02/04 0814) Pulse Rate:  [67] 67 (02/04 0814) Resp:  [18] 18 (02/04 0814) BP: (164)/(86) 164/86 mmHg (02/04 0814) SpO2:  [97 %] 97 % (02/04 0814) Weight:  [98.703 kg (217 lb 9.6 oz)] 98.703 kg (217 lb 9.6 oz) (02/04 4680)   Imaging Review Plain radiographs demonstrate severe degenerative joint disease of the right hip(s). The bone quality appears to be good for age and reported activity level.  Assessment/Plan:  End stage arthritis, right hip(s)  The patient history, physical examination, clinical judgement of the provider and imaging studies are consistent with end stage degenerative joint disease of the right hip(s) and total hip arthroplasty is deemed medically necessary. The treatment options including medical management, injection therapy, arthroscopy and arthroplasty were discussed at length. The risks and benefits of total hip arthroplasty were presented and reviewed. The risks due to aseptic loosening, infection, stiffness, dislocation/subluxation,  thromboembolic complications and other imponderables were discussed.  The patient acknowledged the explanation, agreed to proceed with the plan and consent was signed. Patient is being admitted for inpatient treatment for surgery, pain control, PT, OT, prophylactic antibiotics, VTE prophylaxis, progressive ambulation and ADL's and discharge planning.The patient is planning to be discharged home with home health services.     West Pugh Shaquanna Lycan   PAC  12/21/2013, 1:47 PM

## 2013-12-21 NOTE — Progress Notes (Signed)
bmet results faxed by epic to dr olin 

## 2013-12-24 LAB — URINE CULTURE

## 2013-12-26 NOTE — Progress Notes (Signed)
Left patient message surgery time changed to 1415, npo after midnight except clear liquids midnight until 815 am day of surgerym then npo, arrive 1115 am 12-27-13 [facility] short stay

## 2013-12-27 ENCOUNTER — Encounter (HOSPITAL_COMMUNITY)
Admission: RE | Disposition: A | Discharge status: Home Health | Payer: Self-pay | Source: Ambulatory Visit | Attending: Orthopedic Surgery

## 2013-12-27 ENCOUNTER — Encounter (HOSPITAL_COMMUNITY): Payer: Self-pay | Admitting: *Deleted

## 2013-12-27 ENCOUNTER — Inpatient Hospital Stay (HOSPITAL_COMMUNITY): Payer: Medicare PPO

## 2013-12-27 ENCOUNTER — Inpatient Hospital Stay (HOSPITAL_COMMUNITY)
Admission: RE | Admit: 2013-12-27 | Discharge: 2013-12-28 | DRG: 470 | Disposition: A | Discharge status: Home Health | Payer: Medicare PPO | Source: Ambulatory Visit | Attending: Orthopedic Surgery | Admitting: Orthopedic Surgery

## 2013-12-27 ENCOUNTER — Inpatient Hospital Stay (HOSPITAL_COMMUNITY): Payer: Medicare PPO | Admitting: Anesthesiology

## 2013-12-27 ENCOUNTER — Encounter (HOSPITAL_COMMUNITY): Payer: Medicare PPO | Admitting: Anesthesiology

## 2013-12-27 DIAGNOSIS — M1610 Unilateral primary osteoarthritis, unspecified hip: Principal | ICD-10-CM | POA: Diagnosis present

## 2013-12-27 DIAGNOSIS — G4730 Sleep apnea, unspecified: Secondary | ICD-10-CM | POA: Diagnosis present

## 2013-12-27 DIAGNOSIS — Z923 Personal history of irradiation: Secondary | ICD-10-CM

## 2013-12-27 DIAGNOSIS — I252 Old myocardial infarction: Secondary | ICD-10-CM

## 2013-12-27 DIAGNOSIS — Z6830 Body mass index (BMI) 30.0-30.9, adult: Secondary | ICD-10-CM

## 2013-12-27 DIAGNOSIS — E119 Type 2 diabetes mellitus without complications: Secondary | ICD-10-CM | POA: Diagnosis present

## 2013-12-27 DIAGNOSIS — Z96649 Presence of unspecified artificial hip joint: Secondary | ICD-10-CM

## 2013-12-27 DIAGNOSIS — M169 Osteoarthritis of hip, unspecified: Principal | ICD-10-CM | POA: Diagnosis present

## 2013-12-27 DIAGNOSIS — Z888 Allergy status to other drugs, medicaments and biological substances status: Secondary | ICD-10-CM

## 2013-12-27 DIAGNOSIS — I2510 Atherosclerotic heart disease of native coronary artery without angina pectoris: Secondary | ICD-10-CM | POA: Diagnosis present

## 2013-12-27 DIAGNOSIS — Z9221 Personal history of antineoplastic chemotherapy: Secondary | ICD-10-CM

## 2013-12-27 DIAGNOSIS — I10 Essential (primary) hypertension: Secondary | ICD-10-CM | POA: Diagnosis present

## 2013-12-27 DIAGNOSIS — E785 Hyperlipidemia, unspecified: Secondary | ICD-10-CM | POA: Diagnosis present

## 2013-12-27 DIAGNOSIS — D62 Acute posthemorrhagic anemia: Secondary | ICD-10-CM | POA: Diagnosis not present

## 2013-12-27 DIAGNOSIS — E669 Obesity, unspecified: Secondary | ICD-10-CM | POA: Diagnosis present

## 2013-12-27 HISTORY — PX: TOTAL HIP ARTHROPLASTY: SHX124

## 2013-12-27 LAB — TYPE AND SCREEN
ABO/RH(D): O NEG
ANTIBODY SCREEN: NEGATIVE

## 2013-12-27 LAB — GLUCOSE, CAPILLARY
Glucose-Capillary: 148 mg/dL — ABNORMAL HIGH (ref 70–99)
Glucose-Capillary: 156 mg/dL — ABNORMAL HIGH (ref 70–99)

## 2013-12-27 SURGERY — ARTHROPLASTY, HIP, TOTAL, ANTERIOR APPROACH
Anesthesia: General | Site: Hip | Laterality: Right

## 2013-12-27 MED ORDER — EPHEDRINE SULFATE 50 MG/ML IJ SOLN
INTRAMUSCULAR | Status: AC
  Filled 2013-12-27: qty 1

## 2013-12-27 MED ORDER — NEOSTIGMINE METHYLSULFATE 1 MG/ML IJ SOLN
INTRAMUSCULAR | Status: AC
  Filled 2013-12-27: qty 10

## 2013-12-27 MED ORDER — CEFAZOLIN SODIUM-DEXTROSE 2-3 GM-% IV SOLR
2.0000 g | Freq: Four times a day (QID) | INTRAVENOUS | Status: AC
  Administered 2013-12-27 – 2013-12-28 (×2): 2 g via INTRAVENOUS
  Filled 2013-12-27 (×2): qty 50

## 2013-12-27 MED ORDER — INSULIN ASPART 100 UNIT/ML ~~LOC~~ SOLN
0.0000 [IU] | Freq: Three times a day (TID) | SUBCUTANEOUS | Status: DC
  Administered 2013-12-28: 11 [IU] via SUBCUTANEOUS
  Administered 2013-12-28: 3 [IU] via SUBCUTANEOUS

## 2013-12-27 MED ORDER — HYDROMORPHONE HCL PF 1 MG/ML IJ SOLN
0.5000 mg | INTRAMUSCULAR | Status: DC | PRN
  Administered 2013-12-27: 1 mg via INTRAVENOUS
  Filled 2013-12-27: qty 1

## 2013-12-27 MED ORDER — PROMETHAZINE HCL 25 MG/ML IJ SOLN: 6.2500 mg | INTRAMUSCULAR | Status: DC | PRN

## 2013-12-27 MED ORDER — LACTATED RINGERS IV SOLN
INTRAVENOUS | Status: DC | PRN
  Administered 2013-12-27 (×2): via INTRAVENOUS

## 2013-12-27 MED ORDER — MENTHOL 3 MG MT LOZG
1.0000 | LOZENGE | OROMUCOSAL | Status: DC | PRN
  Filled 2013-12-27: qty 9

## 2013-12-27 MED ORDER — ATORVASTATIN CALCIUM 40 MG PO TABS
40.0000 mg | ORAL_TABLET | Freq: Every evening | ORAL | Status: DC
  Administered 2013-12-27 – 2013-12-28 (×2): 40 mg via ORAL
  Filled 2013-12-27 (×2): qty 1

## 2013-12-27 MED ORDER — KETAMINE HCL 10 MG/ML IJ SOLN
INTRAMUSCULAR | Status: DC | PRN
  Administered 2013-12-27 (×2): 20 mg via INTRAVENOUS
  Administered 2013-12-27: 10 mg via INTRAVENOUS

## 2013-12-27 MED ORDER — FLEET ENEMA 7-19 GM/118ML RE ENEM: 1.0000 | ENEMA | Freq: Once | RECTAL | Status: AC | PRN

## 2013-12-27 MED ORDER — DEXTROSE 5 % IV SOLN
500.0000 mg | Freq: Four times a day (QID) | INTRAVENOUS | Status: DC | PRN
  Filled 2013-12-27: qty 5

## 2013-12-27 MED ORDER — CLOPIDOGREL BISULFATE 75 MG PO TABS
75.0000 mg | ORAL_TABLET | Freq: Every day | ORAL | Status: DC
  Administered 2013-12-28: 75 mg via ORAL
  Filled 2013-12-27 (×3): qty 1

## 2013-12-27 MED ORDER — CHLORHEXIDINE GLUCONATE 4 % EX LIQD: 60.0000 mL | Freq: Once | CUTANEOUS | Status: AC

## 2013-12-27 MED ORDER — CEFAZOLIN SODIUM-DEXTROSE 2-3 GM-% IV SOLR
INTRAVENOUS | Status: AC
  Filled 2013-12-27: qty 50

## 2013-12-27 MED ORDER — HYDROMORPHONE HCL PF 1 MG/ML IJ SOLN: 0.2500 mg | INTRAMUSCULAR | Status: DC | PRN

## 2013-12-27 MED ORDER — PROPOFOL 10 MG/ML IV BOLUS
INTRAVENOUS | Status: AC
  Filled 2013-12-27: qty 20

## 2013-12-27 MED ORDER — POLYETHYLENE GLYCOL 3350 17 G PO PACK
17.0000 g | PACK | Freq: Two times a day (BID) | ORAL | Status: DC
  Administered 2013-12-27 – 2013-12-28 (×2): 17 g via ORAL
  Filled 2013-12-27 (×4): qty 1

## 2013-12-27 MED ORDER — LIDOCAINE HCL (CARDIAC) 20 MG/ML IV SOLN
INTRAVENOUS | Status: DC | PRN
  Administered 2013-12-27: 60 mg via INTRAVENOUS

## 2013-12-27 MED ORDER — BISACODYL 10 MG RE SUPP: 10.0000 mg | Freq: Every day | RECTAL | Status: DC | PRN

## 2013-12-27 MED ORDER — ALUM & MAG HYDROXIDE-SIMETH 200-200-20 MG/5ML PO SUSP: 30.0000 mL | ORAL | Status: DC | PRN

## 2013-12-27 MED ORDER — FERROUS SULFATE 325 (65 FE) MG PO TABS
325.0000 mg | ORAL_TABLET | Freq: Three times a day (TID) | ORAL | Status: DC
Start: 2013-12-27 — End: 2013-12-28
  Administered 2013-12-27 – 2013-12-28 (×4): 325 mg via ORAL
  Filled 2013-12-27 (×5): qty 1

## 2013-12-27 MED ORDER — GLIPIZIDE 10 MG PO TABS
10.0000 mg | ORAL_TABLET | Freq: Every day | ORAL | Status: DC
  Administered 2013-12-28: 10 mg via ORAL
  Filled 2013-12-27 (×2): qty 1

## 2013-12-27 MED ORDER — DIPHENHYDRAMINE HCL 25 MG PO CAPS: 25.0000 mg | ORAL_CAPSULE | Freq: Four times a day (QID) | ORAL | Status: DC | PRN

## 2013-12-27 MED ORDER — HYDROCODONE-ACETAMINOPHEN 7.5-325 MG PO TABS
1.0000 | ORAL_TABLET | ORAL | Status: DC
  Administered 2013-12-27: 1 via ORAL
  Administered 2013-12-28 (×2): 2 via ORAL
  Filled 2013-12-27: qty 1
  Filled 2013-12-27 (×2): qty 2

## 2013-12-27 MED ORDER — PROPOFOL 10 MG/ML IV BOLUS
INTRAVENOUS | Status: DC | PRN
  Administered 2013-12-27: 150 mg via INTRAVENOUS

## 2013-12-27 MED ORDER — ROCURONIUM BROMIDE 100 MG/10ML IV SOLN
INTRAVENOUS | Status: DC | PRN
  Administered 2013-12-27: 50 mg via INTRAVENOUS

## 2013-12-27 MED ORDER — HYDROMORPHONE HCL PF 1 MG/ML IJ SOLN
INTRAMUSCULAR | Status: DC | PRN
  Administered 2013-12-27 (×3): 0.5 mg via INTRAVENOUS

## 2013-12-27 MED ORDER — ATENOLOL 25 MG PO TABS
25.0000 mg | ORAL_TABLET | Freq: Every morning | ORAL | Status: DC
  Administered 2013-12-28: 25 mg via ORAL
  Filled 2013-12-27: qty 1

## 2013-12-27 MED ORDER — MIDAZOLAM HCL 5 MG/5ML IJ SOLN
INTRAMUSCULAR | Status: DC | PRN
  Administered 2013-12-27: 2 mg via INTRAVENOUS

## 2013-12-27 MED ORDER — SODIUM CHLORIDE 0.9 % IJ SOLN
INTRAMUSCULAR | Status: AC
  Filled 2013-12-27: qty 10

## 2013-12-27 MED ORDER — DOCUSATE SODIUM 100 MG PO CAPS
100.0000 mg | ORAL_CAPSULE | Freq: Two times a day (BID) | ORAL | Status: DC
  Administered 2013-12-27 – 2013-12-28 (×2): 100 mg via ORAL
  Filled 2013-12-27 (×3): qty 1

## 2013-12-27 MED ORDER — ZOLPIDEM TARTRATE 5 MG PO TABS: 5.0000 mg | ORAL_TABLET | Freq: Every evening | ORAL | Status: DC | PRN

## 2013-12-27 MED ORDER — ROCURONIUM BROMIDE 100 MG/10ML IV SOLN
INTRAVENOUS | Status: AC
  Filled 2013-12-27: qty 1

## 2013-12-27 MED ORDER — HYDROMORPHONE HCL PF 2 MG/ML IJ SOLN
INTRAMUSCULAR | Status: AC
  Filled 2013-12-27: qty 1

## 2013-12-27 MED ORDER — METOCLOPRAMIDE HCL 5 MG/ML IJ SOLN: 5.0000 mg | Freq: Three times a day (TID) | INTRAMUSCULAR | Status: DC | PRN

## 2013-12-27 MED ORDER — CEFAZOLIN SODIUM-DEXTROSE 2-3 GM-% IV SOLR
2.0000 g | INTRAVENOUS | Status: AC
  Administered 2013-12-27: 2 g via INTRAVENOUS

## 2013-12-27 MED ORDER — METFORMIN HCL 500 MG PO TABS
500.0000 mg | ORAL_TABLET | Freq: Two times a day (BID) | ORAL | Status: DC
  Administered 2013-12-28 (×2): 500 mg via ORAL
  Filled 2013-12-27 (×3): qty 1

## 2013-12-27 MED ORDER — METOCLOPRAMIDE HCL 10 MG PO TABS: 5.0000 mg | ORAL_TABLET | Freq: Three times a day (TID) | ORAL | Status: DC | PRN

## 2013-12-27 MED ORDER — PHENOL 1.4 % MT LIQD: 1.0000 | OROMUCOSAL | Status: DC | PRN

## 2013-12-27 MED ORDER — ASPIRIN EC 81 MG PO TBEC
81.0000 mg | DELAYED_RELEASE_TABLET | Freq: Every day | ORAL | Status: DC
  Administered 2013-12-27 – 2013-12-28 (×2): 81 mg via ORAL
  Filled 2013-12-27 (×2): qty 1

## 2013-12-27 MED ORDER — ONDANSETRON HCL 4 MG PO TABS: 4.0000 mg | ORAL_TABLET | Freq: Four times a day (QID) | ORAL | Status: DC | PRN

## 2013-12-27 MED ORDER — MIDAZOLAM HCL 2 MG/2ML IJ SOLN
INTRAMUSCULAR | Status: AC
  Filled 2013-12-27: qty 2

## 2013-12-27 MED ORDER — 0.9 % SODIUM CHLORIDE (POUR BTL) OPTIME
TOPICAL | Status: DC | PRN
  Administered 2013-12-27: 1000 mL

## 2013-12-27 MED ORDER — ONDANSETRON HCL 4 MG/2ML IJ SOLN
INTRAMUSCULAR | Status: DC | PRN
  Administered 2013-12-27: 2 mg via INTRAVENOUS

## 2013-12-27 MED ORDER — EPHEDRINE SULFATE 50 MG/ML IJ SOLN
INTRAMUSCULAR | Status: DC | PRN
  Administered 2013-12-27: 5 mg via INTRAVENOUS
  Administered 2013-12-27: 10 mg via INTRAVENOUS

## 2013-12-27 MED ORDER — ONDANSETRON HCL 4 MG/2ML IJ SOLN
4.0000 mg | Freq: Four times a day (QID) | INTRAMUSCULAR | Status: DC | PRN
Start: 2013-12-27 — End: 2013-12-28
  Administered 2013-12-27: 4 mg via INTRAVENOUS
  Filled 2013-12-27: qty 2

## 2013-12-27 MED ORDER — FENTANYL CITRATE 0.05 MG/ML IJ SOLN
INTRAMUSCULAR | Status: DC | PRN
  Administered 2013-12-27: 100 ug via INTRAVENOUS
  Administered 2013-12-27 (×3): 50 ug via INTRAVENOUS

## 2013-12-27 MED ORDER — METHOCARBAMOL 500 MG PO TABS: 500.0000 mg | ORAL_TABLET | Freq: Four times a day (QID) | ORAL | Status: DC | PRN

## 2013-12-27 MED ORDER — ONDANSETRON HCL 4 MG/2ML IJ SOLN
INTRAMUSCULAR | Status: AC
  Filled 2013-12-27: qty 2

## 2013-12-27 MED ORDER — LIDOCAINE HCL (CARDIAC) 20 MG/ML IV SOLN
INTRAVENOUS | Status: AC
  Filled 2013-12-27: qty 5

## 2013-12-27 MED ORDER — SODIUM CHLORIDE 0.9 % IV SOLN
100.0000 mL/h | INTRAVENOUS | Status: DC
  Administered 2013-12-27 – 2013-12-28 (×2): 100 mL/h via INTRAVENOUS
  Filled 2013-12-27 (×8): qty 1000

## 2013-12-27 MED ORDER — GLYCOPYRROLATE 0.2 MG/ML IJ SOLN
INTRAMUSCULAR | Status: AC
  Filled 2013-12-27: qty 2

## 2013-12-27 MED ORDER — FENTANYL CITRATE 0.05 MG/ML IJ SOLN
INTRAMUSCULAR | Status: AC
  Filled 2013-12-27: qty 5

## 2013-12-27 SURGICAL SUPPLY — 38 items
BAG ZIPLOCK 12X15 (MISCELLANEOUS) IMPLANT
BLADE SAW SGTL 18X1.27X75 (BLADE) ×2 IMPLANT
CAPT HIP PF MOP ×2 IMPLANT
DERMABOND ADVANCED (GAUZE/BANDAGES/DRESSINGS) ×1
DERMABOND ADVANCED .7 DNX12 (GAUZE/BANDAGES/DRESSINGS) ×1 IMPLANT
DRAPE C-ARM 42X120 X-RAY (DRAPES) ×2 IMPLANT
DRAPE STERI IOBAN 125X83 (DRAPES) ×2 IMPLANT
DRAPE U-SHAPE 47X51 STRL (DRAPES) ×6 IMPLANT
DRSG AQUACEL AG ADV 3.5X10 (GAUZE/BANDAGES/DRESSINGS) ×2 IMPLANT
DRSG TEGADERM 4X4.75 (GAUZE/BANDAGES/DRESSINGS) IMPLANT
DURAPREP 26ML APPLICATOR (WOUND CARE) ×2 IMPLANT
ELECT BLADE TIP CTD 4 INCH (ELECTRODE) ×2 IMPLANT
ELECT REM PT RETURN 9FT ADLT (ELECTROSURGICAL) ×2
ELECTRODE REM PT RTRN 9FT ADLT (ELECTROSURGICAL) ×1 IMPLANT
EVACUATOR 1/8 PVC DRAIN (DRAIN) IMPLANT
FACESHIELD LNG OPTICON STERILE (SAFETY) ×8 IMPLANT
GAUZE SPONGE 2X2 8PLY STRL LF (GAUZE/BANDAGES/DRESSINGS) IMPLANT
GLOVE BIOGEL PI IND STRL 7.5 (GLOVE) ×1 IMPLANT
GLOVE BIOGEL PI IND STRL 8 (GLOVE) ×1 IMPLANT
GLOVE BIOGEL PI INDICATOR 7.5 (GLOVE) ×1
GLOVE BIOGEL PI INDICATOR 8 (GLOVE) ×1
GLOVE ECLIPSE 8.0 STRL XLNG CF (GLOVE) ×2 IMPLANT
GLOVE ORTHO TXT STRL SZ7.5 (GLOVE) ×4 IMPLANT
GOWN SPEC L3 XXLG W/TWL (GOWN DISPOSABLE) ×2 IMPLANT
GOWN STRL REUS W/ TWL XL LVL3 (GOWN DISPOSABLE) ×1 IMPLANT
GOWN STRL REUS W/TWL LRG LVL3 (GOWN DISPOSABLE) ×4 IMPLANT
GOWN STRL REUS W/TWL XL LVL3 (GOWN DISPOSABLE) ×1
KIT BASIN OR (CUSTOM PROCEDURE TRAY) ×2 IMPLANT
PACK TOTAL JOINT (CUSTOM PROCEDURE TRAY) ×2 IMPLANT
PADDING CAST COTTON 6X4 STRL (CAST SUPPLIES) ×2 IMPLANT
SPONGE GAUZE 2X2 STER 10/PKG (GAUZE/BANDAGES/DRESSINGS)
SUT MNCRL AB 4-0 PS2 18 (SUTURE) ×2 IMPLANT
SUT VIC AB 1 CT1 36 (SUTURE) ×6 IMPLANT
SUT VIC AB 2-0 CT1 27 (SUTURE) ×2
SUT VIC AB 2-0 CT1 TAPERPNT 27 (SUTURE) ×2 IMPLANT
SUT VLOC 180 0 24IN GS25 (SUTURE) ×2 IMPLANT
TOWEL OR 17X26 10 PK STRL BLUE (TOWEL DISPOSABLE) ×2 IMPLANT
TOWEL OR NON WOVEN STRL DISP B (DISPOSABLE) ×2 IMPLANT

## 2013-12-27 NOTE — Progress Notes (Signed)
Utilization review completed.  

## 2013-12-27 NOTE — Transfer of Care (Signed)
Immediate Anesthesia Transfer of Care Note  Patient: Louis Alexander  Procedure(s) Performed: Procedure(s): RIGHT TOTAL HIP ARTHROPLASTY ANTERIOR APPROACH (Right)  Patient Location: PACU  Anesthesia Type:General  Level of Consciousness: awake, alert , oriented and patient cooperative  Airway & Oxygen Therapy: Patient Spontanous Breathing and Patient connected to face mask oxygen  Post-op Assessment: Report given to PACU RN, Post -op Vital signs reviewed and stable and Patient moving all extremities  Post vital signs: Reviewed and stable  Complications: No apparent anesthesia complications

## 2013-12-27 NOTE — Progress Notes (Signed)
Portable AP Pelvis and Lateral Right Hip X-rays done. 

## 2013-12-27 NOTE — Transfer of Care (Signed)
Immediate Anesthesia Transfer of Care Note  Patient: Louis Alexander  Procedure(s) Performed: Procedure(s): RIGHT TOTAL HIP ARTHROPLASTY ANTERIOR APPROACH (Right)  Patient Location: PACU  Anesthesia Type:General  Level of Consciousness: sedated  Airway & Oxygen Therapy: Patient Spontanous Breathing and Patient connected to face mask oxygen  Post-op Assessment: Report given to PACU RN and Post -op Vital signs reviewed and stable  Post vital signs: stable  Complications: No apparent anesthesia complications

## 2013-12-27 NOTE — Anesthesia Preprocedure Evaluation (Signed)
Anesthesia Evaluation  Patient identified by MRN, date of birth, ID band Patient awake    Reviewed: Allergy & Precautions, H&P , NPO status , Patient's Chart, lab work & pertinent test results  Airway Mallampati: II TM Distance: >3 FB Neck ROM: Full    Dental no notable dental hx.    Pulmonary sleep apnea ,  breath sounds clear to auscultation  Pulmonary exam normal       Cardiovascular Exercise Tolerance: Good hypertension, Pt. on medications and Pt. on home beta blockers + CAD and + Past MI Rhythm:Regular Rate:Normal  No cardiac symptoms. Dr. Humphrey Rolls in [place].   Neuro/Psych negative neurological ROS  negative psych ROS   GI/Hepatic negative GI ROS, Neg liver ROS,   Endo/Other  diabetes, Type 2, Oral Hypoglycemic Agents  Renal/GU negative Renal ROS  negative genitourinary   Musculoskeletal negative musculoskeletal ROS (+)   Abdominal (+) + obese,   Peds negative pediatric ROS (+)  Hematology negative hematology ROS (+)   Anesthesia Other Findings   Reproductive/Obstetrics negative OB ROS                           Anesthesia Physical Anesthesia Plan  ASA: III  Anesthesia Plan: General   Post-op Pain Management:    Induction: Intravenous  Airway Management Planned: Oral ETT  Additional Equipment:   Intra-op Plan:   Post-operative Plan: Extubation in OR  Informed Consent: I have reviewed the patients History and Physical, chart, labs and discussed the procedure including the risks, benefits and alternatives for the proposed anesthesia with the patient or authorized representative who has indicated his/her understanding and acceptance.   Dental advisory given  Plan Discussed with: CRNA  Anesthesia Plan Comments: (Discussed r/b general versus spinal. Patient prefers general.)        Anesthesia Quick Evaluation

## 2013-12-27 NOTE — Op Note (Signed)
NAME:  Louis Alexander                ACCOUNT NO.: [id number]      MEDICAL RECORD NO.: [id number]      FACILITY:  [facility]      PHYSICIAN:  Paralee Cancel D  DATE OF BIRTH:  May 31, 1941     DATE OF PROCEDURE:  12/27/2013                                 OPERATIVE REPORT         PREOPERATIVE DIAGNOSIS: Right  hip osteoarthritis.      POSTOPERATIVE DIAGNOSIS:  Right hip osteoarthritis.      PROCEDURE:  Right total hip replacement through an anterior approach   utilizing DePuy THR system, component size 63mm pinnacle cup, a size 36+4 neutral   Altrex liner, a size 7 Hi Tri Lock stem with a 36+1.5 Articuleze metal ball.      SURGEON:  Pietro Cassis. Alvan Dame, M.D.      ASSISTANT:  Danae Orleans, PA-C      ANESTHESIA:  General.      SPECIMENS:  None.      COMPLICATIONS:  None.      BLOOD LOSS:  400 cc     DRAINS:  None.      INDICATION OF THE PROCEDURE:  Louis Alexander is a 73 y.o. male who had   presented to office for evaluation of right hip pain.  Radiographs revealed   progressive degenerative changes with bone-on-bone   articulation to the  hip joint.  The patient had painful limited range of   motion significantly affecting their overall quality of life.  The patient was failing to    respond to conservative measures, and at this point was ready   to proceed with more definitive measures.  The patient has noted progressive   degenerative changes in his hip, progressive problems and dysfunction   with regarding the hip prior to surgery.  Consent was obtained for   benefit of pain relief.  Specific risk of infection, DVT, component   failure, dislocation, need for revision surgery, as well discussion of   the anterior versus posterior approach were reviewed.  Consent was   obtained for benefit of anterior pain relief through an anterior   approach.      PROCEDURE IN DETAIL:  The patient was brought to operative theater.   Once adequate anesthesia,  preoperative antibiotics, 2gm Ancef administered.   The patient was positioned supine on the OSI Hanna table.  Once adequate   padding of boney process was carried out, we had predraped out the hip, and  used fluoroscopy to confirm orientation of the pelvis and position.      The right hip was then prepped and draped from proximal iliac crest to   mid thigh with shower curtain technique.      Time-out was performed identifying the patient, planned procedure, and   extremity.     An incision was then made 2 cm distal and lateral to the   anterior superior iliac spine extending over the orientation of the   tensor fascia lata muscle and sharp dissection was carried down to the   fascia of the muscle and protractor placed in the soft tissues.      The fascia was then incised.  The muscle belly was identified and swept   laterally and  retractor placed along the superior neck.  Following   cauterization of the circumflex vessels and removing some pericapsular   fat, a second cobra retractor was placed on the inferior neck.  A third   retractor was placed on the anterior acetabulum after elevating the   anterior rectus.  A L-capsulotomy was along the line of the   superior neck to the trochanteric fossa, then extended proximally and   distally.  Tag sutures were placed and the retractors were then placed   intracapsular.  We then identified the trochanteric fossa and   orientation of my neck cut, confirmed this radiographically   and then made a neck osteotomy with the femur on traction.  The femoral   head was removed without difficulty or complication.  Traction was let   off and retractors were placed posterior and anterior around the   acetabulum.      The labrum and foveal tissue were debrided.  I began reaming with a 54mm   reamer and reamed up to 79mm reamer with good bony bed preparation and a 54   cup was chosen.  The final 17mm Pinnacle cup was then impacted under fluoroscopy  to  confirm the depth of penetration and orientation with respect to   abduction.  A screw was placed followed by the hole eliminator.  The final   36+4 neutral Altrex liner was impacted with good visualized rim fit.  The cup was positioned anatomically within the acetabular portion of the pelvis.      At this point, the femur was rolled at 80 degrees.  Further capsule was   released off the inferior aspect of the femoral neck.  I then   released the superior capsule proximally.  The hook was placed laterally   along the femur and elevated manually and held in position with the bed   hook.  The leg was then extended and adducted with the leg rolled to 100   degrees of external rotation.  Once the proximal femur was fully   exposed, I used a box osteotome to set orientation.  I then began   broaching with the starting chili pepper broach and passed this by hand and then broached up to 7.  With the 7 broach in place I chose a high offset neck and did a trial reduction.  The offset was appropriate, leg lengths   appeared to be equal, confirmed radiographically.   Given these findings, I went ahead and dislocated the hip, repositioned all   retractors and positioned the right hip in the extended and abducted position.  The final 7 Hi Tri Lock stem was   chosen and it was impacted down to the level of neck cut.  Based on this   and the trial reduction, a 36+1 Articuleze metal ball was chosen and   impacted onto a clean and dry trunnion, and the hip was reduced.  The   hip had been irrigated throughout the case again at this point.  I did   reapproximate the superior capsular leaflet to the anterior leaflet   using #1 Vicryl.  The fascia of the   tensor fascia lata muscle was then reapproximated using #1 Vicryl and #0 V-lock sutures.  The   remaining wound was closed with 2-0 Vicryl and running 4-0 Monocryl.   The hip was cleaned, dried, and dressed sterilely using Dermabond and   Aquacel dressing.   He was then brought   to recovery room in stable  condition tolerating the procedure well.    Danae Orleans, PA-C was present for the entirety of the case involved from   preoperative positioning, perioperative retractor management, general   facilitation of the case, as well as primary wound closure as assistant.            Pietro Cassis Alvan Dame, M.D.        12/27/2013 3:21 PM

## 2013-12-27 NOTE — Preoperative (Signed)
Beta Blockers   Reason not to administer Beta Blockers:Not Applicable 

## 2013-12-27 NOTE — Interval H&P Note (Signed)
History and Physical Interval Note:  12/27/2013 12:04 PM  Louis Alexander  has presented today for surgery, with the diagnosis of RIGHT HIP OA  The various methods of treatment have been discussed with the patient and family. After consideration of risks, benefits and other options for treatment, the patient has consented to  Procedure(s): RIGHT TOTAL HIP ARTHROPLASTY ANTERIOR APPROACH (Right) as a surgical intervention .  The patient's history has been reviewed, patient examined, no change in status, stable for surgery.  I have reviewed the patient's chart and labs.  Questions were answered to the patient's satisfaction.     Mauri Pole

## 2013-12-27 NOTE — Anesthesia Postprocedure Evaluation (Signed)
  Anesthesia Post-op Note  Patient: Louis Alexander  Procedure(s) Performed: Procedure(s) (LRB): RIGHT TOTAL HIP ARTHROPLASTY ANTERIOR APPROACH (Right)  Patient Location: PACU  Anesthesia Type: General  Level of Consciousness: awake and alert   Airway and Oxygen Therapy: Patient Spontanous Breathing  Post-op Pain: mild  Post-op Assessment: Post-op Vital signs reviewed, Patient's Cardiovascular Status Stable, Respiratory Function Stable, Patent Airway and No signs of Nausea or vomiting  Last Vitals:  Filed Vitals:   12/27/13 1645  BP: 166/82  Pulse: 66  Temp: 36.6 C  Resp: 16    Post-op Vital Signs: stable   Complications: No apparent anesthesia complications. To floor on OSA precautions, pulse oximetry, and CPAP per respiratory therapy.

## 2013-12-28 LAB — BASIC METABOLIC PANEL
BUN: 14 mg/dL (ref 6–23)
CALCIUM: 8.2 mg/dL — AB (ref 8.4–10.5)
CO2: 26 meq/L (ref 19–32)
CREATININE: 0.87 mg/dL (ref 0.50–1.35)
Chloride: 99 mEq/L (ref 96–112)
GFR calc Af Amer: >90 mL/min (ref >90)
GFR calc non Af Amer: 85 mL/min — ABNORMAL LOW (ref >90)
Glucose, Bld: 201 mg/dL — ABNORMAL HIGH (ref 70–99)
Potassium: 4.5 mEq/L (ref 3.7–5.3)
Sodium: 136 mEq/L — ABNORMAL LOW (ref 137–147)

## 2013-12-28 LAB — GLUCOSE, CAPILLARY
GLUCOSE-CAPILLARY: 316 mg/dL — AB (ref 70–99)
Glucose-Capillary: 215 mg/dL — ABNORMAL HIGH (ref 70–99)
Glucose-Capillary: 169 mg/dL — ABNORMAL HIGH (ref 70–99)

## 2013-12-28 LAB — CBC
HEMATOCRIT: 36.8 % — AB (ref 39.0–52.0)
Hemoglobin: 12.4 g/dL — ABNORMAL LOW (ref 13.0–17.0)
MCH: 28.4 pg (ref 26.0–34.0)
MCHC: 33.7 g/dL (ref 30.0–36.0)
MCV: 84.2 fL (ref 78.0–100.0)
Platelets: 157 10*3/uL (ref 150–400)
RBC: 4.37 MIL/uL (ref 4.22–5.81)
RDW: 13.4 % (ref 11.5–15.5)
WBC: 7.9 10*3/uL (ref 4.0–10.5)

## 2013-12-28 MED ORDER — FERROUS SULFATE 325 (65 FE) MG PO TABS: 325.0000 mg | ORAL_TABLET | Freq: Three times a day (TID) | ORAL | Status: DC

## 2013-12-28 MED ORDER — POLYETHYLENE GLYCOL 3350 17 G PO PACK: 17.0000 g | PACK | Freq: Two times a day (BID) | ORAL | Status: DC

## 2013-12-28 MED ORDER — HYDROCODONE-ACETAMINOPHEN 7.5-325 MG PO TABS: 1.0000 | ORAL_TABLET | ORAL | Status: DC | PRN

## 2013-12-28 MED ORDER — TIZANIDINE HCL 4 MG PO TABS: 4.0000 mg | ORAL_TABLET | Freq: Four times a day (QID) | ORAL | Status: DC | PRN

## 2013-12-28 MED ORDER — DSS 100 MG PO CAPS: 100.0000 mg | ORAL_CAPSULE | Freq: Two times a day (BID) | ORAL | Status: DC

## 2013-12-28 NOTE — Evaluation (Signed)
Physical Therapy Evaluation Patient Details Name: Louis Alexander MRN: [id number] DOB: May 24, 1941 Today's Date: 12/28/2013 Time: 1829-9371 PT Time Calculation (min): 28 min  PT Assessment / Plan / Recommendation History of Present Illness  73 yo male s/p R THA-DA. Hx of DM, CAD, MI, HTN, cancer.   Clinical Impression  On eval, pt required Min assist for mobility-able to ambulate ~40 feet with walker. Limited by pain, fatigue. Made RN aware of need for pain meds. Recommend HHPT.     PT Assessment  Patient needs continued PT services    Follow Up Recommendations  Home health PT    Does the patient have the potential to tolerate intense rehabilitation      Barriers to Discharge        Equipment Recommendations       Recommendations for Other Services OT consult   Frequency 7X/week    Precautions / Restrictions Precautions Precautions: Fall Restrictions Weight Bearing Restrictions: No RLE Weight Bearing: Weight bearing as tolerated   Pertinent Vitals/Pain 146/73 95 bpm 98% RA      Mobility  Bed Mobility Overal bed mobility: Needs Assistance Bed Mobility: Supine to Sit Supine to sit: Min assist General bed mobility comments: assist for R LE off bed. Increased time.  Transfers Overall transfer level: Needs assistance Transfers: Sit to/from Stand Sit to Stand: Min assist;From elevated surface General transfer comment: VCs safety, technique, hand placement. Assist to rise, stabilize control descent Ambulation/Gait Ambulation/Gait assistance: Min assist Ambulation Distance (Feet): 40 Feet Assistive device: Rolling walker (2 wheeled) Gait Pattern/deviations: Decreased stride length;Step-to pattern;Antalgic;Decreased step length - right General Gait Details: slow gait speed. 2 short standing rest breaks needed. Pt reported mild lightheadedness and increased pain. Followed with recliner for safety.     Exercises Total Joint Exercises Ankle Circles/Pumps: AROM;Both;15  reps;Seated Quad Sets: AROM;Both;15 reps;Seated Heel Slides: AAROM;Right;15 reps;Seated Hip ABduction/ADduction: AAROM;Right;15 reps;Seated   PT Diagnosis: Difficulty walking;Abnormality of gait;Acute pain  PT Problem List: Decreased strength;Decreased range of motion;Decreased activity tolerance;Decreased balance;Decreased mobility;Pain;Decreased knowledge of use of DME PT Treatment Interventions: DME instruction;Gait training;Stair training;Functional mobility training;Therapeutic activities;Therapeutic exercise;Patient/family education;Balance training     PT Goals(Current goals can be found in the care plan section) Acute Rehab PT Goals Patient Stated Goal: home soon. regain independence PT Goal Formulation: With patient/family Time For Goal Achievement: 01/04/14 Potential to Achieve Goals: Good  Visit Information  Last PT Received On: 12/28/13 Assistance Needed: +1 History of Present Illness: 73 yo male s/p R THA-DA. Hx of DM, CAD, MI, HTN, cancer.        Prior [place] expects to be discharged to:: Private residence Living Arrangements: Spouse/significant other Available Help at Discharge: Family Type of Home: House Home Access: Stairs to enter Technical brewer of Steps: 3 Entrance Stairs-Rails: Right Home Layout: One level Home Equipment: Environmental consultant - 2 wheels;Cane - single point Prior Function Level of Independence: Independent Communication Communication: No difficulties    Cognition  Cognition Arousal/Alertness: Awake/alert Behavior During Therapy: WFL for tasks assessed/performed Overall Cognitive Status: Within Functional Limits for tasks assessed    Extremity/Trunk Assessment Upper Extremity Assessment Upper Extremity Assessment: Overall WFL for tasks assessed Lower Extremity Assessment Lower Extremity Assessment: RLE deficits/detail RLE Deficits / Details: hip flex 2/5, hip abd/add 2/5, moves ankle well Cervical / Trunk  Assessment Cervical / Trunk Assessment: Normal   Balance    End of Session PT - End of Session Equipment Utilized During Treatment: Gait belt Activity Tolerance: Patient limited by pain;Patient limited by  fatigue Patient left: in chair;with call bell/phone within reach;with family/visitor present Nurse Communication: Mobility status;Patient requests pain meds  GP     Weston Anna, MPT Pager: [phone]

## 2013-12-28 NOTE — Evaluation (Signed)
Occupational Therapy Evaluation Patient Details Name: Louis Alexander MRN: [id number] DOB: 1941/09/12 Today's Date: 12/28/2013 Time: 9678-9381 OT Time Calculation (min): 21 min  OT Assessment / Plan / Recommendation History of present illness 73 yo male s/p R THA-DA. Hx of DM, CAD, MI, HTN, cancer.    Clinical Impression   Pt presents to OT with decreased I with ADL activity s/p THA.  All education complete    OT Assessment  Patient does not need any further OT services    Follow Up Recommendations  No OT follow up       Equipment Recommendations  None recommended by OT          Precautions / Restrictions Precautions Precautions: Fall Restrictions Weight Bearing Restrictions: No RLE Weight Bearing: Weight bearing as tolerated       ADL  Grooming: Set up Where Assessed - Grooming: Unsupported sitting Where Assessed - Upper Body Bathing: Unsupported sitting Lower Body Bathing: Moderate assistance Where Assessed - Lower Body Bathing: Unsupported sit to stand Upper Body Dressing: Set up Where Assessed - Upper Body Dressing: Unsupported sitting Lower Body Dressing: Moderate assistance Where Assessed - Lower Body Dressing: Unsupported sit to stand Toilet Transfer: Min Psychiatric nurse Method: Sit to Loss adjuster, chartered: Comfort height toilet ADL Comments: wife will obtain AE from gift shop      OT Goals(Current goals can be found in the care plan section) Acute Rehab OT Goals Patient Stated Goal: home soon. regain independence  Visit Information  Assistance Needed: +1 History of Present Illness: 73 yo male s/p R THA-DA. Hx of DM, CAD, MI, HTN, cancer.        Prior Pierre expects to be discharged to:: Private residence Living Arrangements: Spouse/significant other Available Help at Discharge: Family Type of Home: House Home Access: Stairs to enter Technical brewer of Steps: 3 Entrance  Stairs-Rails: Right Home Layout: One level Home Equipment: Environmental consultant - 2 wheels;Cane - single point Prior Function Level of Independence: Independent Communication Communication: No difficulties         Vision/Perception Vision - History Patient Visual Report: No change from baseline   Cognition  Cognition Arousal/Alertness: Awake/alert Behavior During Therapy: WFL for tasks assessed/performed Overall Cognitive Status: Within Functional Limits for tasks assessed    Extremity/Trunk Assessment Upper Extremity Assessment Upper Extremity Assessment: Overall WFL for tasks assessed Lower Extremity Assessment Lower Extremity Assessment: RLE deficits/detail RLE Deficits / Details: hip flex 2/5, hip abd/add 2/5, moves ankle well Cervical / Trunk Assessment Cervical / Trunk Assessment: Normal     Mobility Bed Mobility Overal bed mobility: Needs Assistance Bed Mobility: Supine to Sit Supine to sit: Min assist General bed mobility comments: assist for R LE off bed. Increased time.  Transfers Overall transfer level: Needs assistance Transfers: Sit to/from Stand Sit to Stand: Min assist;From elevated surface General transfer comment: VCs safety, technique, hand placement. Assist to rise, stabilize control descent     Exercise Total Joint Exercises Ankle Circles/Pumps: AROM;Both;15 reps;Seated Quad Sets: AROM;Both;15 reps;Seated Heel Slides: AAROM;Right;15 reps;Seated Hip ABduction/ADduction: AAROM;Right;15 reps;Seated      End of Session OT - End of Session Activity Tolerance: Patient tolerated treatment well Patient left: in chair Nurse Communication: Mobility status  GO     Betsy Pries 12/28/2013, 11:48 AM

## 2013-12-28 NOTE — Progress Notes (Signed)
   Subjective: 1 Day Post-Op Procedure(s) (LRB): RIGHT TOTAL HIP ARTHROPLASTY ANTERIOR APPROACH (Right)   Patient reports pain as mild, pain controlled with medication. No events throughout the night. Ready to be discharged home if he does well with PT and pain controlled.   Objective:   VITALS:   Filed Vitals:   12/28/13 0621  BP: 145/83  Pulse: 92  Temp: 98.2 F (36.8 C)  Resp: 16    Neurovascular intact Dorsiflexion/Plantar flexion intact Incision: dressing C/D/I No cellulitis present Compartment soft  LABS  Recent Labs  12/28/13 0433  HGB 12.4*  HCT 36.8*  WBC 7.9  PLT 157     Recent Labs  12/28/13 0433  NA 136*  K 4.5  BUN 14  CREATININE 0.87  GLUCOSE 201*     Assessment/Plan: 1 Day Post-Op Procedure(s) (LRB): RIGHT TOTAL HIP ARTHROPLASTY ANTERIOR APPROACH (Right) Advance diet Up with therapy D/C IV fluids Discharge home with home health Follow up in 2 weeks at [facility]. Follow up with OLIN,Lucille Crichlow D in 2 weeks.  Contact information:  [facility] [address], [place] [phone]     Expected ABLA  Treated with iron and will observe  Obese (BMI 30-39.9)  Estimated body mass index is 30.36 kg/(m^2) as calculated from the following:   Height as of this encounter: 5\' 11"  (1.803 m).   Weight as of this encounter: 98.7 kg (217 lb 9.5 oz). Patient also counseled that weight may inhibit the healing process Patient counseled that losing weight will help with future health issues      West Pugh. Merary Garguilo   PAC  12/28/2013, 9:45 AM

## 2013-12-28 NOTE — Progress Notes (Signed)
Physical Therapy Treatment Patient Details Name: Louis Alexander MRN: [id number] DOB: November 02, 1941 Today's Date: 12/28/2013 Time: 6063-0160 PT Time Calculation (min): 44 min  PT Assessment / Plan / Recommendation  History of Present Illness 73 yo male s/p R THA-DA. Hx of DM, CAD, MI, HTN, cancer.    PT Comments   Pt performed well this session. Practiced ambulation and stair negotiation. Pain well controlled per pt. All education completed. Ready for d/c from PT standpoint-made RN aware.   Follow Up Recommendations  Home health PT     Does the patient have the potential to tolerate intense rehabilitation     Barriers to Discharge        Equipment Recommendations  None recommended by PT (therapist issued crutches)    Recommendations for Other Services    Frequency 7X/week   Progress towards PT Goals Progress towards PT goals: Progressing toward goals  Plan Current plan remains appropriate    Precautions / Restrictions Precautions Precautions: Fall Restrictions Weight Bearing Restrictions: No RLE Weight Bearing: Weight bearing as tolerated   Pertinent Vitals/Pain 3/10 L hip with activity. Ice applied end of session    Mobility  Transfers Overall transfer level: Needs assistance Transfers: Sit to/from Stand Sit to Stand: Min guard General transfer comment: close guard for safety. VCs safety, hand placement Ambulation/Gait Ambulation/Gait assistance: Min guard Ambulation Distance (Feet): 150 Feet (150'x1, 50'x1) Assistive device: Rolling walker (2 wheeled) Gait Pattern/deviations: Step-through pattern;Antalgic;Decreased stride length;Decreased stance time - right General Gait Details: slow gait speed. Pt beginning to adopt reciprocal gait pattern this session. Gait still antalgic but tolerable.  Stairs: Yes Stair Management: One rail Left;Step to pattern;Forwards;With crutches;With walker Number of Stairs: 3 (x2) General stair comments: Practiced x 2-once with walker,  once with 1rail, 1 crutch.Pt prefers 1 rail, 1 crutch and he performed well with this. VCS safety, technique, sequence.     Exercises     PT Diagnosis:    PT Problem List:   PT Treatment Interventions:     PT Goals (current goals can now be found in the care plan section)    Visit Information  Last PT Received On: 12/28/13 Assistance Needed: +1 History of Present Illness: 73 yo male s/p R THA-DA. Hx of DM, CAD, MI, HTN, cancer.     Subjective Data      Cognition  Cognition Arousal/Alertness: Awake/alert Behavior During Therapy: WFL for tasks assessed/performed Overall Cognitive Status: Within Functional Limits for tasks assessed    Balance     End of Session PT - End of Session Equipment Utilized During Treatment: Gait belt Activity Tolerance: Patient tolerated treatment well Patient left: in chair;with call bell/phone within reach;with family/visitor present Nurse Communication: Patient requests pain meds (ready for d/c from PT standpoint)   GP     Weston Anna, MPT Pager: [phone]

## 2013-12-28 NOTE — Progress Notes (Signed)
Spoke with pt's wife and pt via phone. They selected Louis Alexander for HHPT. Wife states she has RW and pt do not need 3 in one.

## 2014-01-04 NOTE — Discharge Summary (Signed)
Physician Discharge Summary  Patient ID: Louis Alexander MRN: [id number] DOB/AGE: 72/24/1942 72 y.o.  Admit date: 12/27/2013 Discharge date: 12/28/2013   Procedures:  Procedure(s) (LRB): RIGHT TOTAL HIP ARTHROPLASTY ANTERIOR APPROACH (Right)  Attending Physician:  Dr. Paralee Cancel   Admission Diagnoses:   Right hip OA / pain  Discharge Diagnoses:  Principal Problem:   S/P right THA, AA  Past Medical History  Diagnosis Date  . Abnormal EKG   . Coronary artery disease   . Diabetes mellitus without complication   . Obesity   . Hyperlipidemia   . Hypertension   . 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 Sleep apnea     could not tolerate cpap mask  . Arthritis     oa  . Cancer     bladder and prostate  . History of radiation therapy 2012  . History of chemotherapy 2014    HPI: Yolanda Dockendorf, 73 y.o. male, has a history of pain and functional disability in the right hip(s) due to arthritis and patient has failed non-surgical conservative treatments for greater than 12 weeks to include NSAID's and/or analgesics and activity modification. Onset of symptoms was gradual starting 1+ years ago with gradually worsening course since that time.The patient noted no past surgery on the right hip(s). Patient currently rates pain in the right hip at 8 out of 10 with activity. Patient has worsening of pain with activity and weight bearing, trendelenberg gait, pain that interfers with activities of daily living and pain with passive range of motion. Patient has evidence of periarticular osteophytes and joint space narrowing by imaging studies. This condition presents safety issues increasing the risk of falls. There is no current active infection. Risks, benefits and expectations were discussed with the patient. Risks including but not limited to the risk of anesthesia, blood clots, nerve damage, blood vessel damage, failure of the prosthesis, infection and up to and including death.  Patient understand the risks, benefits and expectations and wishes to proceed with surgery.  PCP: Carlyn Reichert, MD   Discharged Condition: good  Hospital Course:  Patient underwent the above stated procedure on 12/27/2013. Patient tolerated the procedure well and brought to the recovery room in good condition and subsequently to the floor.  POD #1 BP: 145/83 ; Pulse: 92 ; Temp: 98.2 F (36.8 C) ; Resp: 16  Patient reports pain as mild, pain controlled with medication. No events throughout the night. Ready to be discharged home. Neurovascular intact, dorsiflexion/plantar flexion intact, incision: dressing C/D/I, no cellulitis present and compartment soft.   LABS  Basename    HGB  12.4  HCT  36.8    Discharge Exam: General appearance: alert, cooperative and no distress Extremities: Homans sign is negative, no sign of DVT, no edema, redness or tenderness in the calves or thighs and no ulcers, gangrene or trophic changes  Disposition:   Home-Health Care Svc with follow up in 2 weeks   Follow-up Information   Follow up with Mauri Pole, MD. Schedule an appointment as soon as possible for a visit in 2 weeks.   Specialty:  Orthopedic Surgery   Contact information:   [address] [place] [address] [phone]       Discharge Orders   Future Orders Complete By Expires   Call MD / Call 911  As directed    Comments:     If you experience chest pain or shortness of breath, CALL 911 and be transported  to the hospital emergency room.  If you develope a fever above 101 F, pus (white drainage) or increased drainage or redness at the wound, or calf pain, call your surgeon's office.   Change dressing  As directed    Comments:     Maintain surgical dressing for 10-14 days, or until follow up in the clinic.   Constipation Prevention  As directed    Comments:     Drink plenty of fluids.  Prune juice may be helpful.  You may use a stool softener, such as  Colace (over the counter) 100 mg twice a day.  Use MiraLax (over the counter) for constipation as needed.   Diet - low sodium heart healthy  As directed    Discharge instructions  As directed    Comments:     Maintain surgical dressing for 10-14 days, or until follow up in the clinic. Follow up in 2 weeks at [facility]. Call with any questions or concerns.   Increase activity slowly as tolerated  As directed    TED hose  As directed    Comments:     Use stockings (TED hose) for 2 weeks on both leg(s).  You may remove them at night for sleeping.   Weight bearing as tolerated  As directed    Questions:     Laterality:     Extremity:          Medication List         aspirin EC 81 MG tablet  Take 81 mg by mouth daily.     atenolol 25 MG tablet  Commonly known as:  TENORMIN  Take 25 mg by mouth every morning.     atorvastatin 40 MG tablet  Commonly known as:  LIPITOR  Take 40 mg by mouth every evening.     clopidogrel 75 MG tablet  Commonly known as:  PLAVIX  Take 75 mg by mouth daily with breakfast.     DSS 100 MG Caps  Take 100 mg by mouth 2 (two) times daily.     ferrous sulfate 325 (65 FE) MG tablet  Take 1 tablet (325 mg total) by mouth 3 (three) times daily after meals.     glipiZIDE 10 MG tablet  Commonly known as:  GLUCOTROL  Take 10 mg by mouth daily before breakfast.     HYDROcodone-acetaminophen 7.5-325 MG per tablet  Commonly known as:  NORCO  Take 1-2 tablets by mouth every 4 (four) hours as needed for moderate pain.     metFORMIN 500 MG tablet  Commonly known as:  GLUCOPHAGE  Take 500 mg by mouth 2 (two) times daily with a meal.     multivitamin with minerals Tabs tablet  Take 1 tablet by mouth daily.     polyethylene glycol packet  Commonly known as:  MIRALAX / GLYCOLAX  Take 17 g by mouth 2 (two) times daily.     tiZANidine 4 MG tablet  Commonly known as:  ZANAFLEX  Take 1 tablet (4 mg total) by mouth every 6 (six) hours as needed  for muscle spasms.         Signed: West Pugh. Owens Hara   PAC  01/04/2014, 12:18 PM

## 2014-01-09 ENCOUNTER — Ambulatory Visit: Payer: Self-pay | Admitting: Oncology

## 2014-01-10 LAB — COMPREHENSIVE METABOLIC PANEL
Albumin: 3.4 g/dL (ref 3.4–5.0)
Alkaline Phosphatase: 111 U/L
Anion Gap: 10 (ref 7–16)
BILIRUBIN TOTAL: 0.4 mg/dL (ref 0.2–1.0)
BUN: 22 mg/dL — ABNORMAL HIGH (ref 7–18)
CO2: 27 mmol/L (ref 21–32)
Calcium, Total: 9.1 mg/dL (ref 8.5–10.1)
Chloride: 100 mmol/L (ref 98–107)
Creatinine: 1.19 mg/dL (ref 0.60–1.30)
EGFR (Non-African Amer.): >60
GLUCOSE: 251 mg/dL — AB (ref 65–99)
OSMOLALITY: 286 (ref 275–301)
Potassium: 4.2 mmol/L (ref 3.5–5.1)
SGOT(AST): 18 U/L (ref 15–37)
SGPT (ALT): 28 U/L (ref 12–78)
Sodium: 137 mmol/L (ref 136–145)
TOTAL PROTEIN: 7.3 g/dL (ref 6.4–8.2)

## 2014-01-10 LAB — CBC CANCER CENTER
BASOS ABS: 0.1 x10 3/mm (ref 0.0–0.1)
BASOS PCT: 0.6 %
EOS PCT: 3.5 %
Eosinophil #: 0.3 x10 3/mm (ref 0.0–0.7)
HCT: 38.1 % — AB (ref 40.0–52.0)
HGB: 12.6 g/dL — AB (ref 13.0–18.0)
Lymphocyte #: 1.8 x10 3/mm (ref 1.0–3.6)
Lymphocyte %: 21.7 %
MCH: 27.9 pg (ref 26.0–34.0)
MCHC: 33 g/dL (ref 32.0–36.0)
MCV: 85 fL (ref 80–100)
MONO ABS: 0.7 x10 3/mm (ref 0.2–1.0)
MONOS PCT: 8.5 %
Neutrophil #: 5.4 x10 3/mm (ref 1.4–6.5)
Neutrophil %: 65.7 %
PLATELETS: 397 x10 3/mm (ref 150–440)
RBC: 4.51 10*6/uL (ref 4.40–5.90)
RDW: 14.5 % (ref 11.5–14.5)
WBC: 8.2 x10 3/mm (ref 3.8–10.6)

## 2014-01-15 ENCOUNTER — Ambulatory Visit: Payer: Self-pay | Admitting: Oncology

## 2014-04-13 ENCOUNTER — Ambulatory Visit: Payer: Self-pay | Admitting: Oncology

## 2014-04-13 LAB — URINALYSIS, COMPLETE
Bilirubin,UR: NEGATIVE
Glucose,UR: 50 mg/dL (ref 0–75)
KETONE: NEGATIVE
NITRITE: POSITIVE
PH: 6 (ref 4.5–8.0)
RBC,UR: 22 /HPF (ref 0–5)
SPECIFIC GRAVITY: 1.02 (ref 1.003–1.030)
SQUAMOUS EPITHELIAL: NONE SEEN
WBC UR: 88 /HPF (ref 0–5)

## 2014-04-13 LAB — CBC CANCER CENTER
Basophil #: 0.1 "x10 3/mm "
Basophil %: 0.8 %
Eosinophil #: 0.5 "x10 3/mm "
Eosinophil %: 7.1 %
HCT: 45.7 %
HGB: 15.1 g/dL
Lymphocyte %: 33.4 %
Lymphs Abs: 2.2 "x10 3/mm "
MCH: 28.4 pg
MCHC: 33.1 g/dL
MCV: 86 fL
Monocyte #: 0.7 "x10 3/mm "
Monocyte %: 10.7 %
Neutrophil #: 3.2 "x10 3/mm "
Neutrophil %: 48 %
Platelet: 220 "x10 3/mm "
RBC: 5.34 "x10 6/mm "
RDW: 14.9 % — ABNORMAL HIGH
WBC: 6.6 "x10 3/mm "

## 2014-04-13 LAB — COMPREHENSIVE METABOLIC PANEL WITH GFR
Albumin: 3.7 g/dL
Alkaline Phosphatase: 102 U/L
Anion Gap: 8
BUN: 14 mg/dL
Bilirubin,Total: 0.4 mg/dL
Calcium, Total: 8.6 mg/dL
Chloride: 104 mmol/L
Co2: 28 mmol/L
Creatinine: 0.98 mg/dL
EGFR (African American): >60
EGFR (Non-African Amer.): >60
Glucose: 172 mg/dL — ABNORMAL HIGH
Osmolality: 284
Potassium: 4.7 mmol/L
SGOT(AST): 27 U/L
SGPT (ALT): 48 U/L
Sodium: 140 mmol/L
Total Protein: 6.9 g/dL

## 2014-04-17 ENCOUNTER — Ambulatory Visit: Payer: Self-pay | Admitting: Oncology

## 2014-05-17 ENCOUNTER — Ambulatory Visit: Payer: Self-pay | Admitting: Oncology

## 2014-10-20 ENCOUNTER — Ambulatory Visit: Payer: Self-pay | Admitting: Oncology

## 2014-12-22 LAB — BASIC METABOLIC PANEL
BUN: 21 mg/dL (ref 4–21)
Creatinine: 1 mg/dL (ref 0.6–1.3)
Glucose: 113 mg/dL
Sodium: 139 mmol/L (ref 137–147)

## 2014-12-22 LAB — HEMOGLOBIN A1C: HEMOGLOBIN A1C: 7.2 % — AB (ref 4.0–6.0)

## 2014-12-22 LAB — LIPID PANEL
Cholesterol: 189 mg/dL (ref 0–200)
HDL: 41 mg/dL (ref 35–70)
LDL Cholesterol: 115 mg/dL
TRIGLYCERIDES: 165 mg/dL — AB (ref 40–160)

## 2015-01-22 ENCOUNTER — Ambulatory Visit
Admit: 2015-01-22 | Disposition: A | Discharge status: Home / Self Care | Payer: Self-pay | Attending: Oncology | Admitting: Oncology

## 2015-02-07 ENCOUNTER — Ambulatory Visit: Payer: Self-pay | Admitting: Oncology

## 2015-02-15 ENCOUNTER — Ambulatory Visit
Admit: 2015-02-15 | Disposition: A | Discharge status: Home / Self Care | Payer: Self-pay | Attending: Vascular Surgery | Admitting: Vascular Surgery

## 2015-02-16 ENCOUNTER — Ambulatory Visit
Admit: 2015-02-16 | Disposition: A | Discharge status: Home / Self Care | Payer: Self-pay | Attending: Oncology | Admitting: Oncology

## 2015-02-22 LAB — COMPREHENSIVE METABOLIC PANEL
ALBUMIN: 3.7 g/dL
ALK PHOS: 101 U/L
Anion Gap: 6 — ABNORMAL LOW (ref 7–16)
BILIRUBIN TOTAL: 0.5 mg/dL
BUN: 25 mg/dL — AB
Calcium, Total: 8.9 mg/dL
Chloride: 101 mmol/L
Co2: 29 mmol/L
Creatinine: 0.91 mg/dL
EGFR (African American): >60
EGFR (Non-African Amer.): >60
Glucose: 238 mg/dL — ABNORMAL HIGH
POTASSIUM: 4.4 mmol/L
SGOT(AST): 21 U/L
SGPT (ALT): 20 U/L
Sodium: 136 mmol/L
TOTAL PROTEIN: 6.8 g/dL

## 2015-02-22 LAB — CBC CANCER CENTER
BASOS PCT: 0.5 %
Basophil #: 0 x10 3/mm (ref 0.0–0.1)
EOS PCT: 5.7 %
Eosinophil #: 0.4 x10 3/mm (ref 0.0–0.7)
HCT: 40.6 % (ref 40.0–52.0)
HGB: 13.4 g/dL (ref 13.0–18.0)
Lymphocyte #: 1.4 x10 3/mm (ref 1.0–3.6)
Lymphocyte %: 21.5 %
MCH: 27.5 pg (ref 26.0–34.0)
MCHC: 33 g/dL (ref 32.0–36.0)
MCV: 84 fL (ref 80–100)
MONOS PCT: 10.7 %
Monocyte #: 0.7 x10 3/mm (ref 0.2–1.0)
NEUTROS PCT: 61.6 %
Neutrophil #: 3.9 x10 3/mm (ref 1.4–6.5)
Platelet: 282 x10 3/mm (ref 150–440)
RBC: 4.86 10*6/uL (ref 4.40–5.90)
RDW: 14.8 % — ABNORMAL HIGH (ref 11.5–14.5)
WBC: 6.4 x10 3/mm (ref 3.8–10.6)

## 2015-02-23 LAB — CEA: CEA: 1.3 ng/mL (ref 0.0–4.7)

## 2015-03-01 LAB — CBC CANCER CENTER
BASOS PCT: 0.4 %
Basophil #: 0 x10 3/mm (ref 0.0–0.1)
EOS PCT: 1.9 %
Eosinophil #: 0.1 x10 3/mm (ref 0.0–0.7)
HCT: 39.2 % — ABNORMAL LOW (ref 40.0–52.0)
HGB: 12.9 g/dL — ABNORMAL LOW (ref 13.0–18.0)
Lymphocyte #: 1.2 x10 3/mm (ref 1.0–3.6)
Lymphocyte %: 19.2 %
MCH: 27.5 pg (ref 26.0–34.0)
MCHC: 33 g/dL (ref 32.0–36.0)
MCV: 84 fL (ref 80–100)
MONOS PCT: 7.9 %
Monocyte #: 0.5 x10 3/mm (ref 0.2–1.0)
Neutrophil #: 4.5 x10 3/mm (ref 1.4–6.5)
Neutrophil %: 70.6 %
Platelet: 181 x10 3/mm (ref 150–440)
RBC: 4.69 10*6/uL (ref 4.40–5.90)
RDW: 14.3 % (ref 11.5–14.5)
WBC: 6.4 x10 3/mm (ref 3.8–10.6)

## 2015-03-01 LAB — BASIC METABOLIC PANEL
ANION GAP: 9 (ref 7–16)
BUN: 17 mg/dL
CALCIUM: 8.7 mg/dL — AB
Chloride: 94 mmol/L — ABNORMAL LOW
Co2: 25 mmol/L
Creatinine: 0.89 mg/dL
EGFR (African American): >60
Glucose: 318 mg/dL — ABNORMAL HIGH
Potassium: 4.1 mmol/L
Sodium: 128 mmol/L — ABNORMAL LOW

## 2015-03-06 NOTE — H&P (Signed)
PATIENT NAME:  Louis Alexander, Louis Alexander MR#:  [id number] DATE OF BIRTH:  21-Sep-1941  DATE OF ADMISSION:  10/25/2012  REFERRING PHYSICIAN: Dr. Charlesetta Ivory    PRIMARY CARE PHYSICIAN: Dr. Lelon Huh    PRIMARY CARDIOLOGIST: Dr. Neoma Laming    CHIEF COMPLAINT: Chest pain.   HISTORY OF PRESENT ILLNESS: This is a 74 year old male with significant past medical history of coronary artery disease status post stent in 2001 and 2008, diabetes mellitus, hypertension, hyperlipidemia, recent diagnosis of prostate cancer, and sleep apnea who presents with complaints of chest pain. The patient reports he did have midsternal chest pain on the left side radiating to the left shoulder. It started at 1 p.m., woke him up from sleep where he took 325 of aspirin and he reports his chest pain resolved after that. When he went back to sleep, he reports he had another episode at 2 a.m. that woke him again from sleep where he took another 325 mg of aspirin which prompted him to come to the ED. The patient reports his chest pain resolved after taking the aspirin. He didn't require any sublingual nitroglycerin. As well, the patient reports this chest pain resembles the chest when he had his previous heart attack but this is worse. The patient denies any lightheadedness, any diaphoresis, any nausea, any shortness of breath or palpitations. He denies any chest pain at this point. Initially the patient had negative troponins. The patient's troponin was recycled in four hours where they came back positive. The patient's EKG does show new T wave inversion in the lateral leads. Hospitalist service was requested to admit the patient for further management of his acute coronary syndrome. Case was discussed with Dr. Neoma Laming who has planned cardiac cath done for him today and he requested him to be started on Integrilin and heparin drip.   PAST MEDICAL HISTORY:  1. Recent diagnosis of prostate cancer status post radiation, followed  by Dr. Jacqlyn Larsen. The patient reports currently he is cancer free.  2. Diabetes mellitus.  3. Hypertension.  4. Hyperlipidemia.  5. History of sleep apnea, cannot tolerate BiPAP.  6. Coronary artery disease, status post stenting in 2001 as well as in 2008.  7. Appendectomy in 1950.   HOME MEDICATIONS:  1. Vitamin D3 2000 units once daily.  2. Red yeast rice 600 mg oral daily. 3. Multivitamin with mineral 1 tablet daily.  4. Metformin 500 mg oral 2 times a day.  5. Losartan 100 mg oral daily.  6. Glipizide 5 mg oral daily.  7. Garlic 3235 mg oral daily.  8. Fish Oil 1000 mg oral daily. 9. Co-Q-10 100 mg oral daily.  10. Chondroitin with glucosamine 2 tablets oral daily.  11. Atorvastatin 20 mg oral daily.  12. Atenolol 10 mg oral daily.  13. Aspirin 325 mg oral daily.   ALLERGIES: No known drug allergies.   SOCIAL HISTORY: The patient works as Radio broadcast assistant at [employer]. No tobacco, alcohol, or illicit drug use.   FAMILY HISTORY: Significant for father having CVA at age of 82.   REVIEW OF SYSTEMS: Denies any fever, fatigue, weakness. EYES: Denies blurry vision, double vision, or pain. ENT: Denies tinnitus, ear pain, hearing loss. RESPIRATORY: Denies cough, wheezing, hemoptysis, dyspnea, COPD. CARDIOVASCULAR: Denies any orthopnea, edema, arrhythmia, palpitations, syncope, dyspnea on exertion. Complaining of chest pain radiating to the left shoulder. GI: Denies nausea, vomiting, diarrhea, abdominal pain, hematemesis, GERD. GU: Denies dysuria, hematuria, renal colic. ENDOCRINE: Denies polyuria, polydipsia, heat or cold intolerance. INTEGUMENTARY:  Denies acne, rash, skin lesions. MUSCULOSKELETAL: Denies neck pain, shoulder pain, knee pain, arthritis, cramps, gout. NEUROLOGIC: Denies weakness, dysarthria, epilepsy, tremors, vertigo, ataxia. PSYCHIATRIC: Denies anxiety, insomnia, schizophrenia, nervousness, bipolar disorder, substance or alcohol abuse.     PHYSICAL EXAMINATION:   VITAL  SIGNS: Temperature 98.3, pulse 52, respiratory rate 16, blood pressure 139/84, saturating 98% on room air.   GENERAL: Well nourished male who looks comfortable in bed in no apparent distress.   HEENT: Head is atraumatic, normocephalic. Pupils equal, reactive to light. Pink conjunctivae. Anicteric sclerae. Moist oral mucosa.   NECK: Supple. No thyromegaly. No JVD.   CHEST: Good air entry bilaterally. No wheezing, rales, rhonchi.   CARDIOVASCULAR: S1, S2 heard. No rubs, murmur, gallops.   ABDOMEN: Soft, nontender, nondistended. Bowel sounds present.   EXTREMITIES: No edema. No clubbing. No cyanosis.   PSYCHIATRIC: Appropriate affect. Pleasant, awake, alert x3. Intact judgment and insight.   NEUROLOGIC: Cranial nerves grossly intact. Motor 5 out of 5. No focal deficits.   SKIN: Normal skin turgor. Warm and dry. No rash.   PERTINENT LABS: Glucose 279, BUN 21, creatinine 0.76, sodium 139, potassium 4, chloride 107, CO2 26, anion gap 6. Troponin 0.04, repeat 0.19. White blood cell 5.8, hemoglobin 15.7, hematocrit 46.6, platelets 177.   EKG showing new T wave inversion in the lateral leads.   ASSESSMENT AND PLAN: This is a 74 year old male who presents with complaints of chest pain. He has some EKG changes and positive troponins.  1. Acute coronary syndrome. This is most likely non-ST elevated MI. The patient already took aspirin 325 mg oral x2. Case was discussed with Dr. Neoma Laming, the patient's primary cardiologist, who will see him today. The patient will be started on Integrilin drip and heparin drip. He will be continued on his statin and losartan. At this point will hold his beta-blocker as patient is bradycardic with heart rate in the low 50's. Dr. Humphrey Rolls is planning to have cardiac cath done for the patient today.  2. Diabetes mellitus. Will hold oral hypoglycemic agents. Will continue with insulin sliding scale.  3. Hypertension. Will continue with losartan. Will hold beta-blocker  due to bradycardia.  4. Hyperlipidemia. Continue with statin.  5. History of sleep apnea. The patient reports he cannot tolerate BiPAP at home even though in spite of that he was encouraged to attempt again once discharged.  6. Recent diagnosis of prostate cancer. The patient reports he received radiation. He is being followed by Dr. Jacqlyn Larsen as an outpatient.  7. DVT prophylaxis: The patient is on full dose anticoagulation.  8. GI prophylaxis. On PPI.   CODE STATUS: FULL CODE.   TOTAL TIME SPENT ON ADMISSION AND PATIENT CARE: 55 minutes.   ____________________________ Albertine Patricia, MD dse:drc D: 10/25/2012 08:21:29 ET T: 10/25/2012 08:49:10 ET JOB#: [id number]  cc: Albertine Patricia, MD, <Dictator> Kirstie Peri. Caryn Section, MD Jalil Lorusso Graciela Husbands MD ELECTRONICALLY SIGNED 10/26/2012 4:09

## 2015-03-06 NOTE — Discharge Summary (Signed)
PATIENT NAME:  Louis Alexander, SALEK MR#:  [id number] DATE OF BIRTH:  1941-08-28  DATE OF ADMISSION:  10/25/2012 DATE OF DISCHARGE:  10/26/2012  PRIMARY CARE PHYSICIAN:  Dr. Caryn Section. CARDIOLOGIST: Dr. Humphrey Rolls.   DISCHARGE DIAGNOSES: 1. Acute myocardial infarction, acute coronary syndrome.  2. Hypertension.  3. Diabetes.  4. Hyperlipidemia.   MEDICATIONS ON DISCHARGE:  1. Aspirin 325 mg daily.  2. Glipizide 5 mg daily.  3. Losartan 100 mg daily.  4. Co-Q10, 100 mg daily.  5. Garlic 1027 mg daily.  6. Red yeast rice 600 mg daily.  7. Chondroitin glucosamine 1200 mg/1500 mg 2 tablets daily.  8. Multivitamin with minerals daily.  9. Vitamin D3 200 international units daily.  10. Fish oil 1000 mg daily.  11. Atorvastatin 20 mg at bedtime.  12. Atenolol decreased from 100 mg down to 25 mg daily.  13. Nitroglycerin 0.4 mg sublingually as needed for chest pain. 14. Plavix 75 mg daily.  15. Hold metformin. Can restart in 48 hours.   DIET: Low sodium, carbohydrate-controlled diet, regular consistency.   ACTIVITY: As tolerated.   REFERRAL: Dr. Humphrey Rolls, cardiology in one week and Dr. Caryn Section in two weeks.   REASON FOR ADMISSION: The patient was admitted 10/25/2012, discharged 10/26/2012. He came in with chest pain.   HISTORY OF PRESENT ILLNESS: The patient is a 74 year old man with past medical history of coronary artery disease, status post stent in 2001 and 2008, diabetes, hypertension, hyperlipidemia, recent diagnosis of prostate cancer and sleep apnea who presents with chest pain which woke him up from sleep. He was admitted to the hospital with acute coronary syndrome, most likely myocardial infarction. The patient took aspirin at home. His heart rate was in the 50s and they held his beta blocker. He was brought to the Cardiac Cath Lab and spent most of the day in cath for diagnostic, then for procedure. He had a stent in the mid LAD.   LABORATORY, DIAGNOSTIC AND RADIOLOGICAL DATA: EKG showed  sinus rhythm, first-degree AV block, ST-T wave abnormality, lateral ischemia. Troponin negative at 0.4. Glucose 279, BUN 21, creatinine 0.76, sodium 139, potassium 4.0, chloride 107, CO2 26. White blood cell count 5.8, hemoglobin and hematocrit 15.7 and 46.6, platelet count 177. Chest x-ray showed no acute cardiopulmonary disease, borderline cardiomegaly. Troponin borderline at 0.19. INR was 1.0. Troponin borderline at 0.19. Repeat EKG showed sinus rhythm, first-degree AV block ST-T wave abnormalities, lateral ischemia. Troponin borderline at 0.10. Creatinine upon discharge 0.84, LDL 41, HDL 36, triglycerides 284, glucose upon discharge 179.   HOSPITAL COURSE PER PROBLEM LIST:  1. For the patient's acute myocardial infarction, acute coronary syndrome. The patient was brought to the Cardiac Cath Lab and had a stent placed in the mid LAD. The patient was symptom free and discharged home in stable condition. He will remain on full aspirin until seen by cardiology in one week and then probably can drop down to a baby aspirin. The patient will remain on Plavix for one year. His atenolol was decreased from 100 mg down to 25 mg secondary to bradycardia. Nitroglycerin as needed for chest pain. The patient is already on losartan and statin. 2. Hypertension. Blood pressure when I saw him was 143/78, which was stable. The patient was discharged home in stable condition.  3. Diabetes. He can restart back on his glipizide. I recommended holding the Glucophage for another 48 hours.  4. For his hyperlipidemia, his LDL is acceptable. His HDL low. Exercise should help improve that. Triglycerides up  at 284. Low carbohydrate diet. Most likely this is secondary with the diabetes.      TIME SPENT ON DISCHARGE: 35 minutes    ____________________________ Tana Conch. Leslye Peer, MD rjw:ap D: 10/26/2012 13:27:00 ET T: 10/26/2012 15:02:41 ET JOB#: [id number]  cc: Tana Conch. Leslye Peer, MD, <Dictator> Kirstie Peri. Caryn Section, MD Marisue Brooklyn MD ELECTRONICALLY SIGNED 11/03/2012 15:03

## 2015-03-09 NOTE — Consult Note (Signed)
PATIENT NAME:  Louis Alexander, Louis Alexander MR#:  [id number] DATE OF BIRTH:  07/24/1941  DATE OF CONSULTATION:  10/25/2012  REFERRING PHYSICIAN:   CONSULTING PHYSICIAN:  Dionisio David, MD  INDICATION FOR CONSULTATION: Seven. Chest pain.   HISTORY OF PRESENT ILLNESS: This is a 74 year old white male with a past medical history of hypertension, diabetes, hyperlipidemia, sleep apnea, history of CAD status post PCI and stenting in the past who called me last night because of chest pain. His wife said that he had chest pain one week ago and another episode at 1 a.m. last night. He took an aspirin. It got resolved and at 2 a.m. again he had another episode which was a prolonged episode, thus he called me at home and I advised him to go to the Emergency Room. He denies any chest pain right now, no shortness of breath, and feels very comfortable.   PAST MEDICAL HISTORY: As mentioned:  1. History of diabetes. 2. Hypertension. 3. Hypercholesterolemia. 4. Sleep apnea. 5. History of PCI and stenting.  6. History of prostate cancer, status post radiation and followed by Urology.   SOCIAL HISTORY: Unremarkable.   FAMILY HISTORY: Positive for coronary artery disease.   ALLERGIES: None.   PHYSICAL EXAMINATION:   GENERAL: He is alert and oriented x3 in no acute distress.   VITAL SIGNS: Stable.   NECK: No JVD.   LUNGS: Clear.   HEART: Regular rate and rhythm. Normal S1, S2. No audible murmur.   ABDOMEN: Soft, nontender. Positive bowel sounds.   EXTREMITIES: No pedal edema.   NEUROLOGIC: The patient appears to be intact.   LABORATORY, DIAGNOSTIC, AND RADIOLOGICAL DATA: EKG shows normal sinus rhythm, 61 beats per minute, T wave inversion V4 to V6.   Troponin 0.19. Initial one was negative.   ASSESSMENT AND PLAN:  1. Non-STEMI. 2. Acute coronary syndrome.   PLAN: Left heart catheterization. In the meantime, the patient will be started on IV heparin, aspirin, and Integrilin.    ____________________________ Dionisio David, MD sak:drc D: 10/25/2012 09:02:21 ET T: 10/25/2012 09:14:00 ET JOB#: [id number]  cc: Dionisio David, MD, <Dictator> Dionisio David MD ELECTRONICALLY SIGNED 11/22/2012 8:01

## 2015-03-09 NOTE — Op Note (Signed)
PATIENT NAME:  Louis Alexander, Louis Alexander MR#:  [id number] DATE OF BIRTH:  08-08-1941  DATE OF PROCEDURE:  01/25/2013  PRINCIPAL DIAGNOSIS:  Bladder tumor.   POSTOPERATIVE DIAGNOSIS:  Bladder tumor.   PROCEDURE:  Transurethral resection of bladder tumor, mitomycin bladder instillation.   SURGEON:  Denice Bors. Jacqlyn Larsen, MD   ANESTHESIA:  General endotracheal anesthesia.   INDICATIONS:  The patient is a 74 year old gentleman diagnosed with prostate cancer several years prior. He underwent prostate brachytherapy in addition to external beam radiation. He has had irritative voiding symptoms ongoing for some time. He developed recent gross hematuria and clot retention. Further evaluation demonstrated a mass on the anterior right lateral wall. This was confirmed on cystoscopy. The finding is worrisome for bladder cancer. He presents for transurethral resection.   DESCRIPTION OF PROCEDURE:  After informed consent was obtained, the patient was taken to the operating room and placed in the dorsal lithotomy position under general endotracheal anesthesia. The patient was prepped and draped in the usual standard fashion. The saline resectoscope sheath was placed utilizing the visual obturator. No significant urethral abnormalities were noted. Upon entering the prostatic fossa, moderate bilobar hypertrophy was noted with only partial visual obstruction. The prostate fossa was noted to be relatively short upon entering the bladder. The mucosa was inspected in its entirety. There was a raised solid-appearing area with slight papillary changes extending on the right lateral wall just behind the bladder neck. This extended further anterior than previously appreciated. This also extended onto the base of the bladder just behind the ureteral orifice. The orifice was identifiable. Erythema was noted in close proximity. There were multiple large muscle fibers with several cellules in between. These areas demonstrated slight mucosal  changes with no definitive solid tumor. The remainder of the bladder demonstrated no gross abnormalities. The left ureteral orifice was also easily identified with no lesions noted. The resectoscope loop was then placed through the scope. Pressure was applied to the lower abdomen by the assistant to help in resection of the tumor. The midportion of the tumor was resected. It was then carried more anterior with additional specimens taken. This was continued into the muscular layer. The resection was then taken more inferomedial in close approximation to the ureteral orifice. The area of erythematous change with the large muscle fibers and cellules was extensively cauterized. The decision was made not to make much resection through this area due to the presence of cellules. Minimal bleeding was encountered. These areas were cauterized as the resection was undertaken. Once the tumor was resected, the pieces were collected. They will be sent for pathology analysis. The area was then extensively cauterized including areas surrounding the tumor resection bed. Once this was completed, the bladder was emptied. An 27 French red rubber catheter was then inserted without difficulty and 40 mg of mitomycin reconstituted in 50 mL of sterile water was instilled into the urinary bladder. Chemotherapy protocols were followed. The catheter was removed with the chemotherapy in place. The patient was returned to the supine position and awakened from general endotracheal anesthesia. He was taken to the recovery room in stable condition. There were no problems or complications. The patient tolerated the procedure well.    ____________________________ Denice Bors. Jacqlyn Larsen, MD bsc:si D: 01/25/2013 15:11:03 ET T: 01/25/2013 16:33:07 ET JOB#: [id number]  cc: Denice Bors. Jacqlyn Larsen, MD, <Dictator> Denice Bors COPE MD ELECTRONICALLY SIGNED 01/26/2013 8:20

## 2015-03-11 NOTE — Op Note (Signed)
PATIENT NAME:  Louis Alexander, Louis Alexander MR#:  [id number] DATE OF BIRTH:  August 10, 1941  DATE OF PROCEDURE:  03/04/2012  PROCEDURE: Left interscalene nerve block.   INDICATION: To help this patient with postoperative pain about to have a left shoulder arthroscopy mini open rotator cuff repair and other procedures as needed by Dr. Thornton Park.   DESCRIPTION OF PROCEDURE: The risks and benefits of a left interscalene nerve block and general anesthesia were discussed with the patient in same-day surgery preoperatively and were consented to. He was brought to the PAC-U preoperatively. The usual monitors were applied and he was placed on nasal cannula oxygen. He was lightly sedated with a total of 3 mg of Versed intravenously. The usual landmarks were observed. He had a Betadine prep of his left lateral and anterior neck x3 and a sterile technique was used. A 22-gauge 2 inch Stimuplex needle was advanced through the left interscalene groove with 0.7 mA of output from the stimulator. There was good left biceps movement. There was no heme or paresthesia. He had a total of 30 mL of 0.25% bupivacaine with 1:400,000 of epinephrine injected incrementally. He tolerated the block without problem or complication. I am hopeful that it will help him significantly with his postoperative pain for the duration of the block.   ____________________________ Werner Lean. Phineas Douglas, MD spp:ap D: 03/04/2012 07:22:57 ET T: 03/04/2012 08:01:16 ET JOB#: [id number]  cc: Nicki Reaper P. Phineas Douglas, MD, <Dictator> Lucilla Edin MD ELECTRONICALLY SIGNED 03/04/2012 11:02

## 2015-03-15 LAB — CBC CANCER CENTER
BASOS ABS: 0 x10 3/mm (ref 0.0–0.1)
Basophil %: 0.5 %
EOS PCT: 3.5 %
Eosinophil #: 0.3 x10 3/mm (ref 0.0–0.7)
HCT: 36.1 % — ABNORMAL LOW (ref 40.0–52.0)
HGB: 11.9 g/dL — ABNORMAL LOW (ref 13.0–18.0)
LYMPHS ABS: 1.1 x10 3/mm (ref 1.0–3.6)
Lymphocyte %: 16 %
MCH: 27.5 pg (ref 26.0–34.0)
MCHC: 33.1 g/dL (ref 32.0–36.0)
MCV: 83 fL (ref 80–100)
MONO ABS: 1.2 x10 3/mm — AB (ref 0.2–1.0)
Monocyte %: 16.3 %
NEUTROS PCT: 63.7 %
Neutrophil #: 4.6 x10 3/mm (ref 1.4–6.5)
PLATELETS: 502 x10 3/mm — AB (ref 150–440)
RBC: 4.35 10*6/uL — AB (ref 4.40–5.90)
RDW: 14.7 % — AB (ref 11.5–14.5)
WBC: 7.2 x10 3/mm (ref 3.8–10.6)

## 2015-03-15 LAB — COMPREHENSIVE METABOLIC PANEL
ALT: 20 U/L
Albumin: 3.3 g/dL — ABNORMAL LOW
Alkaline Phosphatase: 105 U/L
Anion Gap: 7 (ref 7–16)
BILIRUBIN TOTAL: 0.2 mg/dL — AB
BUN: 21 mg/dL — AB
CO2: 26 mmol/L
CREATININE: 0.96 mg/dL
Calcium, Total: 8.7 mg/dL — ABNORMAL LOW
Chloride: 100 mmol/L — ABNORMAL LOW
EGFR (African American): >60
EGFR (Non-African Amer.): >60
Glucose: 179 mg/dL — ABNORMAL HIGH
POTASSIUM: 4.5 mmol/L
SGOT(AST): 20 U/L
Sodium: 133 mmol/L — ABNORMAL LOW
Total Protein: 6.6 g/dL

## 2015-03-15 LAB — CREATININE, SERUM: Creatine, Serum: 0.96

## 2015-03-16 ENCOUNTER — Other Ambulatory Visit: Payer: Self-pay | Admitting: Oncology

## 2015-03-16 DIAGNOSIS — C689 Malignant neoplasm of urinary organ, unspecified: Secondary | ICD-10-CM | POA: Insufficient documentation

## 2015-03-18 NOTE — Op Note (Signed)
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   PROCEDURES:  1.  Ultrasound guidance for vascular access right internal jugular vein. 2.  Fluoroscopic guidance for placement of catheter.  3.  Placement of CT compatible Port-A-Cath, right internal jugular vein.   SURGEON:  Algernon Huxley, MD  ANESTHESIA:  Local with moderate conscious sedation.   FLUOROSCOPY TIME:  Less than 1 minute.   CONTRAST:  0.   ESTIMATED BLOOD LOSS:   25 milliliters.   INDICATION FOR PROCEDURE:  This is a 74 year old male with what sounds like metastatic bladder and urethral cancer.   We are asked by the oncology service to place a Port-A- Catheterization as his peripheral venous access is not suitable for continuous chemotherapy, lab draws, CT scan, and other venous needs.   Risks and benefits were discussed. Informed consent was obtained.   DESCRIPTION OF THE PROCEDURE:  The patient was brought to the vascular and interventional radiology suite. The right neck and chest were sterilely prepped and draped, and a sterile surgical field was created. Ultrasound was used to help visualize a patent right internal jugular vein. This was then accessed under direct ultrasound guidance without difficulty with a Seldinger needle and a permanent image was recorded. A J-wire was placed. After skin nick and dilatation, the peel-away sheath was then placed over the wire. I then anesthetized an area under the clavicle approximately 2 fingerbreadths. A transverse incision was created and an inferior pocket was created with electrocautery and blunt dissection. The port was then brought onto the field, placed into the pocket and secured to the chest wall with 2 Prolene  sutures. The catheter was connected to the port and tunneled from the subclavicular incision to the access site. Fluoroscopic guidance was used to cut the catheter to an appropriate length. The catheter was then placed through the peel-away sheath and the peel-away sheath was removed. The catheter tip was parked in excellent location in the cavoatrial junction.  The pocket was then irrigated with antibiotic-impregnated saline and the wound was closed with a running 3-0 Vicryl and a 4-0 Monocryl. The access incision was closed with a single 4-0 Monocryl. The Huber needle was used to withdraw blood and flush the port with heparinized saline. Dermabond was then placed as a dressing. The patient tolerated the procedure well and was taken to the recovery room in stable condition.    ____________________________ Algernon Huxley, MD jsd:tr D: 02/15/2015 10:47:34 ET T: 02/15/2015 12:32:36 ET JOB#: [id number]  cc: Algernon Huxley, MD, <Dictator> Algernon Huxley MD ELECTRONICALLY SIGNED 02/19/2015 15:51

## 2015-03-18 NOTE — Consult Note (Signed)
Reason for Visit: This 74 year old Male patient presents to the clinic for initial evaluation of  stage IV bladder cancer .   Referred by dr. Grayland Ormond.  Diagnosis:  Chief Complaint/Diagnosis   74 year old male with widespread metastatic disease from bladder cancer status post radical cystectomy back in 2014 with adjuvant chemotherapy now with right hip pain and penile pain with PET CT scan showing widespread metastatic disease and hypermetabolic activity noticed 2 areas for consideration of palliative treatment.  Pathology Report pathology report reviewed   Imaging Report PET CT scan reviewed   Referral Report clinical notes reviewed   Planned Treatment Regimen palliative radiation therapy to right SI joint   HPI   patient is a 74 year old male whose history dates back to 2014 when he underwent a radical cystectomy with diversion for a pathologic stage TIIIa N0 urothelial carcinoma. He underwent adjuvant gemcitabine cis-platinum chemotherapy. Patient has also history 2012 of both interstitial and external beam radiation therapy for prostate cancer. Patient also has comorbidities including hypertension adult onset diabetes coronary artery disease with stent placement. Patient had initially refused neoadjuvant chemotherapy prior to radical cystectomy on 03/29/2013.he recently presented with increasing penile pain and enlargement as well as right hip pain. PET CT scan demonstrates widespread metastatic disease including liver lung. Also has a large hypermetabolic  sclerotic lesion in the right SI joint consistent with metastatic disease as well as intense metabolic activity in the penis.he is seen today for consideration of palliative treatment.  Past Hx:    ca of liver,lung.hip:    ureostomy tube:    HTN:    Diabetes Mellitus, Type II (NIDD):    CAD:    Myocardial Infarct: 2001   Sleep Apnea:    Cardiac Stents:    Prostate Surgery:    Appendectomy:    Umbilical Hernia Repair:    Past, Family and Social History:  Past Medical History positive   Cardiovascular coronary artery disease; hypertension; myocardial infarction   Respiratory sleep apnea   Genitourinary urostomy diversion status post radical cystectomy   Endocrine diabetes mellitus   Past Surgical History appendectomy; umbilical herniorrhaphy repair   Family History noncontributory   Social History noncontributory   Additional Past Medical and Surgical History accompanied by wife today   Allergies:   Celebrex: Bleeding  Effient: Bleeding  Home Meds:  Home Medications: Medication Instructions Status  acetaminophen-HYDROcodone 325 mg-10 mg oral tablet 1-2  tab(s) orally every 6 hours as needed for pain Active  atenolol 25 mg oral tablet 1 tab(s) orally once a day (in the morning) Active  aspirin 81 mg oral tablet 1 tab(s) orally once a day (in the morning). Will continue, but not to take day of surgery Active  Lasix 20 mg oral tablet 1 tab(s) orally once a day Active  multi vitamin 1   once a day Active  krill oil 300 mg 1   once a day Active  Co Q-10 100 mg oral capsule 1 cap(s) orally once a day Active  tumeric 500mg  1   once a day Active  glipiZIDE 10 mg oral tablet 1 tab(s) orally once a day (in the morning) Active  atorvastatin 40 mg oral tablet 1 tab(s) orally once a day (at bedtime) Active  clopidogrel 75 mg oral tablet 1 tab(s) orally once a day (in the morning). To stop after 2/6/dose. Active  metformin 500 mg oral tablet, extended release 1 tab(s) orally 2 times a day. Stop 48 hours preop. Active   Review of Systems:  General negative   Performance Status (ECOG) 0   Skin negative   Breast negative   Ophthalmologic negative   ENMT negative   Respiratory and Thorax negative   Cardiovascular see HPI   Gastrointestinal negative   Genitourinary see HPI   Musculoskeletal negative   Neurological negative   Psychiatric negative   Hematology/Lymphatics negative    Endocrine negative   Allergic/Immunologic negative   Nursing Notes:  Nursing Vital Signs and Chemo Nursing Nursing Notes: *CC Vital Signs Flowsheet:   01-Apr-16 09:08  Temp Temperature 96.4  Pulse Pulse 70  SBP SBP 179  DBP DBP 83  Current Weight (kg) (kg) 97.1   Physical Exam:  General/Skin/HEENT:  General normal   Skin normal   Eyes normal   ENMT normal   Head and Neck normal   Additional PE well-developed male in moderate pain distress. Lungs are clear to A&P cardiac examination shows regular rate and rhythm. Urostomy stoma is well functioning well. Abdomen is benign. He does have penile enlargement although no evidence of overt ulceration is noted. Range of motion of his lower extremities does not elicit pain. Motor sensory and DTR levels all appear equal and symmetric in the lower extremities bilaterally proprioception is intact. Pelvic tilt does not elicit pain deep palpation of the spine does not elicit pain.   Breasts/Resp/CV/GI/GU:  Respiratory and Thorax normal   Cardiovascular normal   Gastrointestinal normal   Genitourinary normal   MS/Neuro/Psych/Lymph:  Musculoskeletal normal   Neurological normal   Lymphatics normal   Other Results:  Radiology Results: Nuclear Med:    23-Mar-16 15:09, PET/CT Restaging Bladder  PET/CT Restaging Bladder   REASON FOR EXAM:    Hx Bladder CA    New Liver Lesions seen on CT  COMMENTS:       PROCEDURE: PET - PET/CT RESTAGING BLADDER  - Feb 07 2015  3:09PM     CLINICAL DATA:  Subsequent treatment strategy for bladder carcinoma.  New liver lesion on CT.    EXAM:  NUCLEAR MEDICINE PET SKULL BASE TO THIGH    TECHNIQUE:  12.5 mCi F-18 FDG was injected intravenously. Full-ring PET imaging  was performed from the skull base to thigh after the radiotracer. CT  data was obtained and used for attenuation correction and anatomic  localization.    FASTING BLOOD GLUCOSE:  Value: 112 mg/dl    COMPARISON:  CT  01/29/2015    FINDINGS:  NECK    No hypermetabolic lymph nodes in the neck.    CHEST    Hypermetabolic nodule along the pleural surface of the right upper  lobe measuring 15 mm with SUV max 4.8. Is not a likely pleural  metastasis in the right lateral hemi thorax which is not clearly  seen on CT. There is a hypermetabolic nodal metastasis inferior to  the bronchus intermedius measuring 10 mm (image 102, series 3 with  intense metabolic activity    ABDOMEN/PELVIS    Therefore hypermetabolic lesions within the liver. Two within the  right hepatic lobe to within the left hepatic lobe. Example lesion  in the right with SUV max equal 19.9. Example lesion the left SUV  max equal 18.9. There are vague hypodense lesion seen on noncontrast  CT corresponding to these lesions.    Hypermetabolic left external iliac lymph node measuring 23 mm short  axis intense metabolic activity (is image 250 8 of the fuseddata  set.    There is an ileal conduit and urostomy in the left  lower quadrant.    There is intense metabolic activity associated with thickening at  the base of the penis. This thickening measures 4 cm on image 67 of  the CT data set. This intensely metabolic with SUV max of 2.5. This  abnormal metabolic activity extends into the shaft and glans with  SUV max equal 12.5.    SKELETON    Intense hypermetabolic activity associated sclerotic lesion the  spanning the right SI joint consistent with skeletal metastasis.  Please intensely metabolic with SUV max equal 24.   IMPRESSION:  1. Four  hypermetabolic liver metastasis.  2. Small hypermetabolic right pulmonary nodule as well as  mediastinal metastatic lymph node.  3. Intensely metabolic enlarged left inguinal lymph node metastasis.  4. Intense metabolic activity associated with sclerotic lesion  either side of the right SI joints most consistent metastatic  disease.  5. Intense metabolic activity associated with thickening of  the  penis from the base to the glans consistent with malignancy.  These results will be called to the ordering clinician or  representative by the Radiologist Assistant, and communication  documented in the PACS or zVision Dashboard.    Electronically Signed  By: Suzy Bouchard M.D.    On: 02/07/2015 17:51         Verified By: Rennis Golden, M.D.,   Relevent Results:   Relevant Scans and Labs PET CT scan reviewed   Assessment and Plan: Impression:   widespread metastatic urothelial carcinoma with large painful mass in the right SI joint as well as penile involvement in 74 year old male status post radical cystectomy for a T3 muscle invading transitional cell carcinoma. Plan:   the stomach off a pallet of radiation therapy to his right SI joint based on the marked bony destruction, pain and hypermetabolic activity by PET CT scan in this area. We treated 3000 cGy in 10 fractions. I have ordered an scheduled CT simulation for early next week. I am less enthusiastic about treating his penis at this time since it is extremely difficult organ to treat with considerable side effects. I would hope this area may respond to systemic chemotherapy which is being planned by medical oncology. Should that area fail to resolve I may offer palliative treatment in the future. This is all discussed with the patient and his wife and both seem to comprehend my treatment plan well. Patient's also had a Port-A-Cath placed for starting early next week of his systemic chemotherapy.  I would like to take this opportunity for allowing me to participate in the care of your patient..  Fax to Physician:  Physicians To Recieve Fax: Birdie Sons, MD - [phone].  Electronic Signatures: Regan Llorente, Roda Shutters (MD)  (Signed 01-Apr-16 11:21)  Authored: HPI, Diagnosis, Past Hx, PFSH, Allergies, Home Meds, ROS, Nursing Notes, Physical Exam, Other Results, Relevent Results, Encounter Assessment and Plan, Fax to  Physician   Last Updated: 01-Apr-16 11:21 by Armstead Peaks (MD)

## 2015-03-21 ENCOUNTER — Other Ambulatory Visit: Payer: Self-pay

## 2015-03-21 DIAGNOSIS — C689 Malignant neoplasm of urinary organ, unspecified: Secondary | ICD-10-CM

## 2015-03-22 ENCOUNTER — Inpatient Hospital Stay (HOSPITAL_BASED_OUTPATIENT_CLINIC_OR_DEPARTMENT_OTHER): Payer: PPO | Admitting: Oncology

## 2015-03-22 ENCOUNTER — Inpatient Hospital Stay: Payer: PPO | Attending: Oncology

## 2015-03-22 ENCOUNTER — Inpatient Hospital Stay: Payer: PPO

## 2015-03-22 VITALS — BP 138/83 | HR 78 | Temp 96.8°F | Resp 18

## 2015-03-22 DIAGNOSIS — Z79899 Other long term (current) drug therapy: Secondary | ICD-10-CM | POA: Insufficient documentation

## 2015-03-22 DIAGNOSIS — G4730 Sleep apnea, unspecified: Secondary | ICD-10-CM

## 2015-03-22 DIAGNOSIS — Z5111 Encounter for antineoplastic chemotherapy: Secondary | ICD-10-CM | POA: Insufficient documentation

## 2015-03-22 DIAGNOSIS — Z923 Personal history of irradiation: Secondary | ICD-10-CM

## 2015-03-22 DIAGNOSIS — E1165 Type 2 diabetes mellitus with hyperglycemia: Secondary | ICD-10-CM

## 2015-03-22 DIAGNOSIS — E669 Obesity, unspecified: Secondary | ICD-10-CM | POA: Insufficient documentation

## 2015-03-22 DIAGNOSIS — C689 Malignant neoplasm of urinary organ, unspecified: Secondary | ICD-10-CM

## 2015-03-22 DIAGNOSIS — I2510 Atherosclerotic heart disease of native coronary artery without angina pectoris: Secondary | ICD-10-CM

## 2015-03-22 DIAGNOSIS — E785 Hyperlipidemia, unspecified: Secondary | ICD-10-CM

## 2015-03-22 DIAGNOSIS — C679 Malignant neoplasm of bladder, unspecified: Secondary | ICD-10-CM | POA: Insufficient documentation

## 2015-03-22 DIAGNOSIS — C787 Secondary malignant neoplasm of liver and intrahepatic bile duct: Secondary | ICD-10-CM | POA: Diagnosis not present

## 2015-03-22 DIAGNOSIS — M129 Arthropathy, unspecified: Secondary | ICD-10-CM | POA: Insufficient documentation

## 2015-03-22 DIAGNOSIS — Z7982 Long term (current) use of aspirin: Secondary | ICD-10-CM | POA: Insufficient documentation

## 2015-03-22 DIAGNOSIS — I252 Old myocardial infarction: Secondary | ICD-10-CM

## 2015-03-22 DIAGNOSIS — I10 Essential (primary) hypertension: Secondary | ICD-10-CM | POA: Diagnosis not present

## 2015-03-22 DIAGNOSIS — C7910 Secondary malignant neoplasm of unspecified urinary organs: Secondary | ICD-10-CM

## 2015-03-22 LAB — CBC WITH DIFFERENTIAL/PLATELET
BASOS ABS: 0.1 10*3/uL (ref 0–0.1)
Basophils Relative: 1 %
Eosinophils Absolute: 0.1 10*3/uL (ref 0–0.7)
Eosinophils Relative: 2 %
HEMATOCRIT: 35.2 % — AB (ref 40.0–52.0)
Hemoglobin: 11.8 g/dL — ABNORMAL LOW (ref 13.0–18.0)
LYMPHS PCT: 14 %
Lymphs Abs: 0.7 10*3/uL — ABNORMAL LOW (ref 1.0–3.6)
MCH: 27.6 pg (ref 26.0–34.0)
MCHC: 33.5 g/dL (ref 32.0–36.0)
MCV: 82.6 fL (ref 80.0–100.0)
MONO ABS: 0.6 10*3/uL (ref 0.2–1.0)
Monocytes Relative: 13 %
NEUTROS ABS: 3.5 10*3/uL (ref 1.4–6.5)
Neutrophils Relative %: 70 %
PLATELETS: 345 10*3/uL (ref 150–440)
RBC: 4.25 MIL/uL — ABNORMAL LOW (ref 4.40–5.90)
RDW: 14.4 % (ref 11.5–14.5)
WBC: 5 10*3/uL (ref 3.8–10.6)

## 2015-03-22 LAB — COMPREHENSIVE METABOLIC PANEL
ALBUMIN: 3.2 g/dL — AB (ref 3.5–5.0)
ALT: 19 U/L (ref 17–63)
AST: 18 U/L (ref 15–41)
Alkaline Phosphatase: 102 U/L (ref 38–126)
Anion gap: 4 — ABNORMAL LOW (ref 5–15)
BILIRUBIN TOTAL: 0.3 mg/dL (ref 0.3–1.2)
BUN: 19 mg/dL (ref 6–20)
CALCIUM: 8.8 mg/dL — AB (ref 8.9–10.3)
CHLORIDE: 99 mmol/L — AB (ref 101–111)
CO2: 29 mmol/L (ref 22–32)
CREATININE: 0.94 mg/dL (ref 0.61–1.24)
GFR calc Af Amer: >60 mL/min (ref >60)
GFR calc non Af Amer: >60 mL/min (ref >60)
Glucose, Bld: 242 mg/dL — ABNORMAL HIGH (ref 65–99)
Potassium: 5.1 mmol/L (ref 3.5–5.1)
Sodium: 132 mmol/L — ABNORMAL LOW (ref 135–145)
Total Protein: 6.3 g/dL — ABNORMAL LOW (ref 6.5–8.1)

## 2015-03-22 MED ORDER — SODIUM CHLORIDE 0.9 % IV SOLN
35.0000 mg/m2 | Freq: Once | INTRAVENOUS | Status: AC
  Administered 2015-03-22: 75 mg via INTRAVENOUS
  Filled 2015-03-22: qty 75

## 2015-03-22 MED ORDER — HEPARIN SOD (PORK) LOCK FLUSH 100 UNIT/ML IV SOLN
500.0000 [IU] | Freq: Once | INTRAVENOUS | Status: AC | PRN
  Administered 2015-03-22: 500 [IU]

## 2015-03-22 MED ORDER — POTASSIUM CHLORIDE 2 MEQ/ML IV SOLN
Freq: Once | INTRAVENOUS | Status: DC
  Filled 2015-03-22: qty 10

## 2015-03-22 MED ORDER — SODIUM CHLORIDE 0.9 % IV SOLN
Freq: Once | INTRAVENOUS | Status: AC
  Administered 2015-03-22: 13:00:00 via INTRAVENOUS
  Filled 2015-03-22: qty 5

## 2015-03-22 MED ORDER — SODIUM CHLORIDE 0.9 % IV SOLN
1000.0000 mg/m2 | Freq: Once | INTRAVENOUS | Status: AC
  Administered 2015-03-22: 2128 mg via INTRAVENOUS
  Filled 2015-03-22: qty 50.71

## 2015-03-22 MED ORDER — PALONOSETRON HCL INJECTION 0.25 MG/5ML
0.2500 mg | Freq: Once | INTRAVENOUS | Status: AC
Start: 2015-03-22 — End: 2015-03-22
  Administered 2015-03-22: 0.25 mg via INTRAVENOUS
  Filled 2015-03-22: qty 5

## 2015-03-22 MED ORDER — POTASSIUM CHLORIDE 2 MEQ/ML IV SOLN
Freq: Once | INTRAVENOUS | Status: AC
  Administered 2015-03-22: 11:00:00 via INTRAVENOUS
  Filled 2015-03-22: qty 1000

## 2015-03-22 MED ORDER — SODIUM CHLORIDE 0.9 % IV SOLN
Freq: Once | INTRAVENOUS | Status: AC
  Administered 2015-03-22: 10:00:00 via INTRAVENOUS
  Filled 2015-03-22: qty 250

## 2015-03-22 MED ORDER — SODIUM CHLORIDE 0.9 % IJ SOLN
10.0000 mL | INTRAMUSCULAR | Status: DC | PRN
  Administered 2015-03-22: 10 mL
  Filled 2015-03-22: qty 10

## 2015-03-22 NOTE — Progress Notes (Signed)
Patient is having some nausea that is relieved with taking Compazine.

## 2015-03-25 ENCOUNTER — Other Ambulatory Visit: Payer: Self-pay | Admitting: Family Medicine

## 2015-03-26 NOTE — Progress Notes (Signed)
[facility]  [phone] [phone]) [phone]  ID: Louis Alexander OB: Jul 26, 1941  MR#: [id number]  DZH#:[id number]  Patient Care Team: Birdie Sons, MD as PCP - General (Family Medicine)  CHIEF COMPLAINT:  Chief Complaint  Patient presents with  . Follow-up    Urothelial carcinoma  . Chemotherapy    INTERVAL HISTORY: Patient returns to clinic for further evaluation and consideration of cycle 2, day 8 of gemcitabine and cisplatin. His pain is better controlled after completing his XRT. He currently feels well.  He has no neurologic complaints. He denies any recent fevers. He denies any chest pain or shortness of breath. He denies any nausea, vomiting, constipation, or diarrhea. He has no urinary complaints. Patient otherwise feels well and offers no further specific complaints today.     REVIEW OF SYSTEMS:   Review of Systems  Constitutional: Negative for malaise/fatigue.  Respiratory: Negative.   Cardiovascular: Negative.     As per HPI. Otherwise, a complete review of systems is negatve.  PAST MEDICAL HISTORY: Past Medical History  Diagnosis Date  . Abnormal EKG   . Coronary artery disease   . Diabetes mellitus without complication   . Obesity   . Hyperlipidemia   . Hypertension   . 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 Sleep apnea     could not tolerate cpap mask  . Arthritis     oa  . Cancer     bladder and prostate  . History of radiation therapy 2012  . History of chemotherapy 2014  . Bladder cancer     PAST SURGICAL HISTORY: Past Surgical History  Procedure Laterality Date  . Cardiac catheterization  12-06-2003  . Lad stent  2000 x1 stent, 2008 x 1 stent, 2012 x 1 stent  . Ptca  2001  . Hernia repair  yrs ago  . Appendectomy  many yrs ago  . Rotator cuff repair Left yrs ago  . Seed implant to prostate with radiation  2012  . Protatectomy and urostomy  2014  . Total hip arthroplasty Right 12/27/2013   Procedure: RIGHT TOTAL HIP ARTHROPLASTY ANTERIOR APPROACH;  Surgeon: Mauri Pole, MD;  Location: [facility];  Service: Orthopedics;  Laterality: Right;    FAMILY HISTORY: Reviewed and unchanged. No report of malignancy or chronic disease.     ADVANCED DIRECTIVES:    HEALTH MAINTENANCE: History  Substance Use Topics  . Smoking status: Never Smoker   . Smokeless tobacco: Never Used  . Alcohol Use: Yes     Comment: occasional     Colonoscopy:  PAP:  Bone density:  Lipid panel:  Allergies  Allergen Reactions  . Celebrex [Celecoxib]     Other reaction(s): Bleeding Bleeding in bladder  . Effient [Prasugrel] Other (See Comments)    Other reaction(s): Bleeding Bleeding in bladder    Current Outpatient Prescriptions  Medication Sig Dispense Refill  . aspirin EC 81 MG tablet Take 81 mg by mouth daily.    [name] atenolol (TENORMIN) 25 MG tablet Take 25 mg by mouth every morning.    [name] atorvastatin (LIPITOR) 40 MG tablet Take 40 mg by mouth every evening.    . clopidogrel (PLAVIX) 75 MG tablet Take 75 mg by mouth daily with breakfast.    . Coenzyme Q10 10 MG capsule Take 10 mg by mouth.    . fentaNYL (DURAGESIC - DOSED MCG/HR) 75 MCG/HR Place 75 mcg onto the skin every 3 (three) days.    [name]  glipiZIDE (GLUCOTROL) 10 MG tablet Take 10 mg by mouth daily before breakfast.    . lidocaine-prilocaine (EMLA) cream Apply 1 application topically once.    . metFORMIN (GLUCOPHAGE) 500 MG tablet Take 500 mg by mouth 2 (two) times daily with a meal.    . Multiple Vitamin (MULTIVITAMIN WITH MINERALS) TABS tablet Take 1 tablet by mouth daily.    . Oxycodone HCl 10 MG TABS Take 10 mg by mouth every 6 (six) hours.    . polyethylene glycol (MIRALAX / GLYCOLAX) packet Take 17 g by mouth 2 (two) times daily. 14 each 0  . docusate sodium 100 MG CAPS Take 100 mg by mouth 2 (two) times daily. (Patient not taking: Reported on 03/22/2015) 10 capsule 0  . ferrous sulfate 325 (65 FE) MG tablet Take 1 tablet (325 mg  total) by mouth 3 (three) times daily after meals. (Patient not taking: Reported on 03/22/2015)  3  . HYDROcodone-acetaminophen (NORCO) 7.5-325 MG per tablet Take 1-2 tablets by mouth every 4 (four) hours as needed for moderate pain. (Patient not taking: Reported on 03/22/2015) 100 tablet 0  . prochlorperazine (COMPAZINE) 10 MG tablet 1 TAB(S) ORALLY EVERY 6 HOURS, AS NEEDED 40 tablet 1  . tiZANidine (ZANAFLEX) 4 MG tablet Take 1 tablet (4 mg total) by mouth every 6 (six) hours as needed for muscle spasms. (Patient not taking: Reported on 03/22/2015) 30 tablet 0   No current facility-administered medications for this visit.   Facility-Administered Medications Ordered in Other Visits  Medication Dose Route Frequency Provider Last Rate Last Dose  . chlorhexidine (HIBICLENS) 4 % liquid 4 application  60 mL Topical Once Danae Orleans, PA-C       And  . chlorhexidine (HIBICLENS) 4 % liquid 4 application  60 mL Topical Once Danae Orleans, PA-C        OBJECTIVE: There were no vitals filed for this visit.   There is no weight on file to calculate BMI.    ECOG FS:1 - Symptomatic but completely ambulatory  General: Well-developed, well-nourished, no acute distress. Eyes: anicteric sclera. Lungs: Clear to auscultation bilaterally. Heart: Regular rate and rhythm. No rubs, murmurs, or gallops. Abdomen: Soft, nontender, nondistended. No organomegaly noted, normoactive bowel sounds. Musculoskeletal: No edema, cyanosis, or clubbing. Neuro: Alert, answering all questions appropriately. Cranial nerves grossly intact. Skin: No rashes or petechiae noted. Psych: Normal affect.    LAB RESULTS:  Lab Results  Component Value Date   NA 132* 03/22/2015   K 5.1 03/22/2015   CL 99* 03/22/2015   CO2 29 03/22/2015   GLUCOSE 242* 03/22/2015   BUN 19 03/22/2015   CREATININE 0.94 03/22/2015   CALCIUM 8.8* 03/22/2015   PROT 6.3* 03/22/2015   ALBUMIN 3.2* 03/22/2015   AST 18 03/22/2015   ALT 19 03/22/2015    ALKPHOS 102 03/22/2015   BILITOT 0.3 03/22/2015   GFRNONAA >60 03/22/2015   GFRAA >60 03/22/2015    Lab Results  Component Value Date   WBC 5.0 03/22/2015   NEUTROABS 3.5 03/22/2015   HGB 11.8* 03/22/2015   HCT 35.2* 03/22/2015   MCV 82.6 03/22/2015   PLT 345 03/22/2015     STUDIES: No results found.  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  PLAN:    1.  Urothelial carcinoma: PET scan results reviewed independently confirming metastatic disease. Proceed with cycle 2, day 8 of Gemcitabine 1000mg /m2 and Cisplatinum 35mg /m2. Patient will also continue with his palliative XRT. Patient will receive this regimen on  days 1, and 8 with day 15 off. Return to clinic in 2 weeks for consideration of cycle 3, day 1. Plan to reimage at the conclusion of cycle 4. Patient expressed understanding and was in agreement with this plan. 2. Urethral pain: Significantly improved. Secondary to malignancy, patient has now completed XRT. Continue fentanyl and hydrocodone as ordered. 3. Hyperglycemia: Patient is diabetic. Continue glipizide and metformin as prescribed. Monitor.  Patient expressed understanding and was in agreement with this plan. He also understands that He can call clinic at any time with any questions, concerns, or complaints.   Urothelial cancer   Staging form: Kidney, AJCC 7th Edition     Clinical stage from 03/16/2015: Stage IV (TX, N1, M1) - Signed by Lloyd Huger, MD on 03/16/2015   Lloyd Huger, MD   03/26/2015 10:46 AM

## 2015-04-03 ENCOUNTER — Other Ambulatory Visit: Payer: Self-pay | Admitting: Family Medicine

## 2015-04-04 ENCOUNTER — Other Ambulatory Visit: Payer: Self-pay | Admitting: Oncology

## 2015-04-05 ENCOUNTER — Inpatient Hospital Stay: Payer: PPO | Attending: Oncology | Admitting: Oncology

## 2015-04-05 ENCOUNTER — Inpatient Hospital Stay: Payer: PPO

## 2015-04-05 VITALS — BP 118/75 | HR 65 | Temp 96.2°F | Resp 20

## 2015-04-05 VITALS — BP 144/76 | HR 71 | Temp 97.1°F | Resp 20 | Wt 194.7 lb

## 2015-04-05 DIAGNOSIS — Z7982 Long term (current) use of aspirin: Secondary | ICD-10-CM | POA: Diagnosis not present

## 2015-04-05 DIAGNOSIS — E1165 Type 2 diabetes mellitus with hyperglycemia: Secondary | ICD-10-CM

## 2015-04-05 DIAGNOSIS — I10 Essential (primary) hypertension: Secondary | ICD-10-CM

## 2015-04-05 DIAGNOSIS — C689 Malignant neoplasm of urinary organ, unspecified: Secondary | ICD-10-CM

## 2015-04-05 DIAGNOSIS — Z79899 Other long term (current) drug therapy: Secondary | ICD-10-CM

## 2015-04-05 DIAGNOSIS — Z8546 Personal history of malignant neoplasm of prostate: Secondary | ICD-10-CM

## 2015-04-05 DIAGNOSIS — C679 Malignant neoplasm of bladder, unspecified: Secondary | ICD-10-CM | POA: Diagnosis not present

## 2015-04-05 DIAGNOSIS — Z923 Personal history of irradiation: Secondary | ICD-10-CM | POA: Diagnosis not present

## 2015-04-05 DIAGNOSIS — I2510 Atherosclerotic heart disease of native coronary artery without angina pectoris: Secondary | ICD-10-CM

## 2015-04-05 DIAGNOSIS — E785 Hyperlipidemia, unspecified: Secondary | ICD-10-CM

## 2015-04-05 DIAGNOSIS — Z5111 Encounter for antineoplastic chemotherapy: Secondary | ICD-10-CM | POA: Insufficient documentation

## 2015-04-05 DIAGNOSIS — C7910 Secondary malignant neoplasm of unspecified urinary organs: Secondary | ICD-10-CM

## 2015-04-05 LAB — CBC WITH DIFFERENTIAL/PLATELET
Basophils Absolute: 0 10*3/uL (ref 0–0.1)
Basophils Relative: 1 %
Eosinophils Absolute: 0.2 10*3/uL (ref 0–0.7)
Eosinophils Relative: 3 %
HCT: 32.7 % — ABNORMAL LOW (ref 40.0–52.0)
Hemoglobin: 10.8 g/dL — ABNORMAL LOW (ref 13.0–18.0)
Lymphocytes Relative: 19 %
Lymphs Abs: 1.1 10*3/uL (ref 1.0–3.6)
MCH: 27.6 pg (ref 26.0–34.0)
MCHC: 33 g/dL (ref 32.0–36.0)
MCV: 83.8 fL (ref 80.0–100.0)
Monocytes Absolute: 0.9 10*3/uL (ref 0.2–1.0)
Monocytes Relative: 16 %
NEUTROS ABS: 3.7 10*3/uL (ref 1.4–6.5)
NEUTROS PCT: 61 %
PLATELETS: 274 10*3/uL (ref 150–440)
RBC: 3.91 MIL/uL — ABNORMAL LOW (ref 4.40–5.90)
RDW: 15.4 % — ABNORMAL HIGH (ref 11.5–14.5)
WBC: 5.9 10*3/uL (ref 3.8–10.6)

## 2015-04-05 LAB — COMPREHENSIVE METABOLIC PANEL
ALT: 14 U/L — AB (ref 17–63)
AST: 17 U/L (ref 15–41)
Albumin: 3.2 g/dL — ABNORMAL LOW (ref 3.5–5.0)
Alkaline Phosphatase: 98 U/L (ref 38–126)
Anion gap: 2 — ABNORMAL LOW (ref 5–15)
BUN: 17 mg/dL (ref 6–20)
CALCIUM: 8.6 mg/dL — AB (ref 8.9–10.3)
CO2: 30 mmol/L (ref 22–32)
Chloride: 100 mmol/L — ABNORMAL LOW (ref 101–111)
Creatinine, Ser: 0.89 mg/dL (ref 0.61–1.24)
GFR calc Af Amer: >60 mL/min (ref >60)
GFR calc non Af Amer: >60 mL/min (ref >60)
Glucose, Bld: 162 mg/dL — ABNORMAL HIGH (ref 65–99)
Potassium: 4.3 mmol/L (ref 3.5–5.1)
SODIUM: 132 mmol/L — AB (ref 135–145)
Total Bilirubin: 0.4 mg/dL (ref 0.3–1.2)
Total Protein: 6.4 g/dL — ABNORMAL LOW (ref 6.5–8.1)

## 2015-04-05 MED ORDER — POTASSIUM CHLORIDE 2 MEQ/ML IV SOLN
Freq: Once | INTRAVENOUS | Status: AC
  Administered 2015-04-05: 10:00:00 via INTRAVENOUS
  Filled 2015-04-05: qty 10

## 2015-04-05 MED ORDER — SODIUM CHLORIDE 0.9 % IV SOLN
Freq: Once | INTRAVENOUS | Status: AC
  Administered 2015-04-05: 12:00:00 via INTRAVENOUS
  Filled 2015-04-05: qty 5

## 2015-04-05 MED ORDER — SODIUM CHLORIDE 0.9 % IV SOLN
Freq: Once | INTRAVENOUS | Status: AC
  Administered 2015-04-05: 10:00:00 via INTRAVENOUS
  Filled 2015-04-05: qty 250

## 2015-04-05 MED ORDER — SODIUM CHLORIDE 0.9 % IV SOLN
1000.0000 mg/m2 | Freq: Once | INTRAVENOUS | Status: AC
  Administered 2015-04-05: 2128 mg via INTRAVENOUS
  Filled 2015-04-05: qty 55.97

## 2015-04-05 MED ORDER — SODIUM CHLORIDE 0.9 % IJ SOLN
10.0000 mL | INTRAMUSCULAR | Status: DC | PRN
  Administered 2015-04-05: 10 mL via INTRAVENOUS
  Filled 2015-04-05: qty 10

## 2015-04-05 MED ORDER — HEPARIN SOD (PORK) LOCK FLUSH 100 UNIT/ML IV SOLN
500.0000 [IU] | Freq: Once | INTRAVENOUS | Status: AC
  Administered 2015-04-05: 500 [IU] via INTRAVENOUS
  Filled 2015-04-05: qty 5

## 2015-04-05 MED ORDER — PALONOSETRON HCL INJECTION 0.25 MG/5ML
0.2500 mg | Freq: Once | INTRAVENOUS | Status: AC
  Administered 2015-04-05: 0.25 mg via INTRAVENOUS
  Filled 2015-04-05: qty 5

## 2015-04-05 MED ORDER — SODIUM CHLORIDE 0.9 % IV SOLN
35.0000 mg/m2 | Freq: Once | INTRAVENOUS | Status: AC
  Administered 2015-04-05: 75 mg via INTRAVENOUS
  Filled 2015-04-05: qty 75

## 2015-04-05 NOTE — Progress Notes (Signed)
[place]  Telephone:(336) [phone] Fax:(336) [phone]  ID: [name] OB: 05-06-1941  MR#: [id number]  FBP#:[id number]  Patient Care Team: Birdie Sons, MD as PCP - General (Family Medicine)  CHIEF COMPLAINT:  Chief Complaint  Patient presents with  . Follow-up    bladder cancer  . Chemotherapy    INTERVAL HISTORY: Patient returns to clinic for further evaluation and consideration of cycle 3, day 1 of gemcitabine and cisplatin. His pain is better controlled after completing his XRT. He currently feels well.  He has no neurologic complaints. He denies any recent fevers. He denies any chest pain or shortness of breath. He denies any nausea, vomiting, constipation, or diarrhea. He has no urinary complaints. Patient otherwise feels well and offers no further specific complaints today.   REVIEW OF SYSTEMS:   Review of Systems  Constitutional: Negative.   Respiratory: Negative.   Cardiovascular: Negative.   Musculoskeletal:       Pelvic pain    As per HPI. Otherwise, a complete review of systems is negatve.  PAST MEDICAL HISTORY: Past Medical History  Diagnosis Date  . Abnormal EKG   . Coronary artery disease   . Diabetes mellitus without complication   . Obesity   . Hyperlipidemia   . Hypertension   . 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 Sleep apnea     could not tolerate cpap mask  . Arthritis     oa  . Cancer     bladder and prostate  . History of radiation therapy 2012  . History of chemotherapy 2014  . Bladder cancer     PAST SURGICAL HISTORY: Past Surgical History  Procedure Laterality Date  . Cardiac catheterization  12-06-2003  . Lad stent  2000 x1 stent, 2008 x 1 stent, 2012 x 1 stent  . Ptca  2001  . Hernia repair  yrs ago  . Appendectomy  many yrs ago  . Rotator cuff repair Left yrs ago  . Seed implant to prostate with radiation  2012  . Protatectomy and urostomy  2014  . Total hip arthroplasty Right  12/27/2013    Procedure: RIGHT TOTAL HIP ARTHROPLASTY ANTERIOR APPROACH;  Surgeon: Mauri Pole, MD;  Location: [facility];  Service: Orthopedics;  Laterality: Right;    FAMILY HISTORY: Reviewed and unchanged. No report of malignancy or chronic disease.     ADVANCED DIRECTIVES:    HEALTH MAINTENANCE: History  Substance Use Topics  . Smoking status: Never Smoker   . Smokeless tobacco: Never Used  . Alcohol Use: Yes     Comment: occasional     Colonoscopy:  PAP:  Bone density:  Lipid panel:  Allergies  Allergen Reactions  . Celebrex [Celecoxib]     Other reaction(s): Bleeding Bleeding in bladder  . Effient [Prasugrel] Other (See Comments)    Other reaction(s): Bleeding Bleeding in bladder    Current Outpatient Prescriptions  Medication Sig Dispense Refill  . aspirin EC 81 MG tablet Take 81 mg by mouth daily.    [name] atenolol (TENORMIN) 25 MG tablet Take 25 mg by mouth every morning.    [name] atorvastatin (LIPITOR) 40 MG tablet Take 40 mg by mouth every evening.    . clopidogrel (PLAVIX) 75 MG tablet Take 75 mg by mouth daily with breakfast.    . Coenzyme Q10 10 MG capsule Take 10 mg by mouth.    . docusate sodium 100 MG CAPS Take 100 mg by mouth  2 (two) times daily. 10 capsule 0  . fentaNYL (DURAGESIC - DOSED MCG/HR) 75 MCG/HR Place 75 mcg onto the skin every 3 (three) days.    . ferrous sulfate 325 (65 FE) MG tablet Take 1 tablet (325 mg total) by mouth 3 (three) times daily after meals.  3  . glipiZIDE (GLUCOTROL) 10 MG tablet Take 10 mg by mouth daily before breakfast.    . lidocaine-prilocaine (EMLA) cream Apply 1 application topically once.    . metFORMIN (GLUCOPHAGE) 500 MG tablet Take 500 mg by mouth 2 (two) times daily with a meal.    . Multiple Vitamin (MULTIVITAMIN WITH MINERALS) TABS tablet Take 1 tablet by mouth daily.    . Oxycodone HCl 10 MG TABS Take 10 mg by mouth every 6 (six) hours.    . polyethylene glycol (MIRALAX / GLYCOLAX) packet Take 17 g by mouth 2 (two)  times daily. 14 each 0  . prochlorperazine (COMPAZINE) 10 MG tablet TAKE 1 TAB(S) BY MOUTH  EVERY 6 HOURS, AS NEEDED 40 tablet 0  . tiZANidine (ZANAFLEX) 4 MG tablet Take 1 tablet (4 mg total) by mouth every 6 (six) hours as needed for muscle spasms. 30 tablet 0  . HYDROcodone-acetaminophen (NORCO) 7.5-325 MG per tablet Take 1-2 tablets by mouth every 4 (four) hours as needed for moderate pain. (Patient not taking: Reported on 04/05/2015) 100 tablet 0   No current facility-administered medications for this visit.   Facility-Administered Medications Ordered in Other Visits  Medication Dose Route Frequency Provider Last Rate Last Dose  . chlorhexidine (HIBICLENS) 4 % liquid 4 application  60 mL Topical Once Danae Orleans, PA-C       And  . chlorhexidine (HIBICLENS) 4 % liquid 4 application  60 mL Topical Once Danae Orleans, PA-C      . heparin lock flush 100 unit/mL  500 Units Intravenous Once Lloyd Huger, MD      . sodium chloride 0.9 % injection 10 mL  10 mL Intravenous PRN Lloyd Huger, MD   10 mL at 04/05/15 0900    OBJECTIVE: Filed Vitals:   04/05/15 0937  BP: 144/76  Pulse: 71  Temp: 97.1 F (36.2 C)  Resp: 20     Body mass index is 27.87 kg/(m^2).    ECOG FS:1 - Symptomatic but completely ambulatory  General: Well-developed, well-nourished, no acute distress. Eyes: anicteric sclera. Lungs: Clear to auscultation bilaterally. Heart: Regular rate and rhythm. No rubs, murmurs, or gallops. Abdomen: Soft, nontender, nondistended. No organomegaly noted, normoactive bowel sounds. Musculoskeletal: No edema, cyanosis, or clubbing. Neuro: Alert, answering all questions appropriately. Cranial nerves grossly intact. Skin: No rashes or petechiae noted. Psych: Normal affect.    LAB RESULTS:  Lab Results  Component Value Date   NA 132* 03/22/2015   K 5.1 03/22/2015   CL 99* 03/22/2015   CO2 29 03/22/2015   GLUCOSE 242* 03/22/2015   BUN 19 03/22/2015   CREATININE  0.94 03/22/2015   CALCIUM 8.8* 03/22/2015   PROT 6.3* 03/22/2015   ALBUMIN 3.2* 03/22/2015   AST 18 03/22/2015   ALT 19 03/22/2015   ALKPHOS 102 03/22/2015   BILITOT 0.3 03/22/2015   GFRNONAA >60 03/22/2015   GFRAA >60 03/22/2015    Lab Results  Component Value Date   WBC 5.9 04/05/2015   NEUTROABS 3.7 04/05/2015   HGB 10.8* 04/05/2015   HCT 32.7* 04/05/2015   MCV 83.8 04/05/2015   PLT 274 04/05/2015     STUDIES: No results found.  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  PLAN:    1.  Urothelial carcinoma: PET scan results reviewed independently confirming metastatic disease. Proceed with cycle 3, day 1 of Gemcitabine 1000mg /m2 and Cisplatinum 35mg /m2. Patient has now completed his palliative XRT. Patient will receive this regimen on days 1, and 8 with day 15 off. Return to clinic in 1 week for consideration of cycle 3, day 8. Plan to reimage at the conclusion of cycle 4. If patient progresses within one year, continue consider newly approved atezolizumab.  2. Urethral pain: Significantly improved. Secondary to malignancy, patient has now completed XRT. Continue fentanyl as prescribed. We will increase oxycodone dose for better pain control. 3. Hyperglycemia: Patient is diabetic. Continue glipizide and metformin as prescribed. Monitor.  Patient expressed understanding and was in agreement with this plan. He also understands that He can call clinic at any time with any questions, concerns, or complaints.   Urothelial cancer   Staging form: Kidney, AJCC 7th Edition     Clinical stage from 03/16/2015: Stage IV (TX, N1, M1) - Signed by Lloyd Huger, MD on 03/16/2015   Lloyd Huger, MD   04/05/2015 9:52 AM

## 2015-04-11 ENCOUNTER — Other Ambulatory Visit: Payer: Self-pay | Admitting: Family Medicine

## 2015-04-11 ENCOUNTER — Other Ambulatory Visit: Payer: Self-pay | Admitting: Oncology

## 2015-04-11 DIAGNOSIS — C689 Malignant neoplasm of urinary organ, unspecified: Secondary | ICD-10-CM

## 2015-04-12 ENCOUNTER — Inpatient Hospital Stay: Payer: PPO

## 2015-04-12 ENCOUNTER — Inpatient Hospital Stay (HOSPITAL_BASED_OUTPATIENT_CLINIC_OR_DEPARTMENT_OTHER): Payer: PPO | Admitting: Oncology

## 2015-04-12 VITALS — BP 160/83 | HR 55 | Temp 95.3°F | Resp 16 | Wt 189.6 lb

## 2015-04-12 DIAGNOSIS — G4730 Sleep apnea, unspecified: Secondary | ICD-10-CM

## 2015-04-12 DIAGNOSIS — C679 Malignant neoplasm of bladder, unspecified: Secondary | ICD-10-CM

## 2015-04-12 DIAGNOSIS — I252 Old myocardial infarction: Secondary | ICD-10-CM

## 2015-04-12 DIAGNOSIS — E1165 Type 2 diabetes mellitus with hyperglycemia: Secondary | ICD-10-CM

## 2015-04-12 DIAGNOSIS — Z7982 Long term (current) use of aspirin: Secondary | ICD-10-CM | POA: Diagnosis not present

## 2015-04-12 DIAGNOSIS — Z5111 Encounter for antineoplastic chemotherapy: Secondary | ICD-10-CM | POA: Diagnosis not present

## 2015-04-12 DIAGNOSIS — C787 Secondary malignant neoplasm of liver and intrahepatic bile duct: Secondary | ICD-10-CM

## 2015-04-12 DIAGNOSIS — I2510 Atherosclerotic heart disease of native coronary artery without angina pectoris: Secondary | ICD-10-CM

## 2015-04-12 DIAGNOSIS — E785 Hyperlipidemia, unspecified: Secondary | ICD-10-CM

## 2015-04-12 DIAGNOSIS — Z79899 Other long term (current) drug therapy: Secondary | ICD-10-CM

## 2015-04-12 DIAGNOSIS — C689 Malignant neoplasm of urinary organ, unspecified: Secondary | ICD-10-CM

## 2015-04-12 DIAGNOSIS — M129 Arthropathy, unspecified: Secondary | ICD-10-CM

## 2015-04-12 DIAGNOSIS — Z923 Personal history of irradiation: Secondary | ICD-10-CM

## 2015-04-12 DIAGNOSIS — I10 Essential (primary) hypertension: Secondary | ICD-10-CM

## 2015-04-12 DIAGNOSIS — E669 Obesity, unspecified: Secondary | ICD-10-CM

## 2015-04-12 LAB — COMPREHENSIVE METABOLIC PANEL
ALT: 14 U/L — ABNORMAL LOW (ref 17–63)
AST: 27 U/L (ref 15–41)
Albumin: 3 g/dL — ABNORMAL LOW (ref 3.5–5.0)
Alkaline Phosphatase: 97 U/L (ref 38–126)
Anion gap: 11 (ref 5–15)
BUN: 18 mg/dL (ref 6–20)
CO2: 19 mmol/L — ABNORMAL LOW (ref 22–32)
Calcium: 8.3 mg/dL — ABNORMAL LOW (ref 8.9–10.3)
Chloride: 100 mmol/L — ABNORMAL LOW (ref 101–111)
Creatinine, Ser: 0.83 mg/dL (ref 0.61–1.24)
GFR calc Af Amer: >60 mL/min (ref >60)
GFR calc non Af Amer: >60 mL/min (ref >60)
Glucose, Bld: 196 mg/dL — ABNORMAL HIGH (ref 65–99)
Potassium: 4.3 mmol/L (ref 3.5–5.1)
Sodium: 130 mmol/L — ABNORMAL LOW (ref 135–145)
Total Bilirubin: 0.7 mg/dL (ref 0.3–1.2)
Total Protein: 6.1 g/dL — ABNORMAL LOW (ref 6.5–8.1)

## 2015-04-12 LAB — CBC WITH DIFFERENTIAL/PLATELET
Basophils Absolute: 0 10*3/uL (ref 0–0.1)
Basophils Relative: 1 %
Eosinophils Absolute: 0.1 10*3/uL (ref 0–0.7)
Eosinophils Relative: 1 %
HCT: 31.1 % — ABNORMAL LOW (ref 40.0–52.0)
Hemoglobin: 10.4 g/dL — ABNORMAL LOW (ref 13.0–18.0)
Lymphocytes Relative: 14 %
Lymphs Abs: 0.9 10*3/uL — ABNORMAL LOW (ref 1.0–3.6)
MCH: 27.9 pg (ref 26.0–34.0)
MCHC: 33.3 g/dL (ref 32.0–36.0)
MCV: 83.7 fL (ref 80.0–100.0)
Monocytes Absolute: 0.9 10*3/uL (ref 0.2–1.0)
Monocytes Relative: 13 %
Neutro Abs: 4.7 10*3/uL (ref 1.4–6.5)
Neutrophils Relative %: 71 %
Platelets: 354 10*3/uL (ref 150–440)
RBC: 3.71 MIL/uL — ABNORMAL LOW (ref 4.40–5.90)
RDW: 15.4 % — ABNORMAL HIGH (ref 11.5–14.5)
WBC: 6.6 10*3/uL (ref 3.8–10.6)

## 2015-04-12 MED ORDER — MEGESTROL ACETATE 625 MG/5ML PO SUSP: 625.0000 mg | Freq: Every day | ORAL | Status: DC

## 2015-04-12 MED ORDER — SODIUM CHLORIDE 0.9 % IV SOLN
1000.0000 mg/m2 | Freq: Once | INTRAVENOUS | Status: AC
  Administered 2015-04-12: 2128 mg via INTRAVENOUS
  Filled 2015-04-12: qty 5.26

## 2015-04-12 MED ORDER — SODIUM CHLORIDE 0.9 % IV SOLN
Freq: Once | INTRAVENOUS | Status: AC
  Administered 2015-04-12: 14:00:00 via INTRAVENOUS
  Filled 2015-04-12: qty 5

## 2015-04-12 MED ORDER — SODIUM CHLORIDE 0.9 % IV SOLN
Freq: Once | INTRAVENOUS | Status: AC
  Administered 2015-04-12: 12:00:00 via INTRAVENOUS
  Filled 2015-04-12: qty 250

## 2015-04-12 MED ORDER — SODIUM CHLORIDE 0.9 % IV SOLN
35.0000 mg/m2 | Freq: Once | INTRAVENOUS | Status: AC
  Administered 2015-04-12: 75 mg via INTRAVENOUS
  Filled 2015-04-12: qty 75

## 2015-04-12 MED ORDER — PALONOSETRON HCL INJECTION 0.25 MG/5ML
0.2500 mg | Freq: Once | INTRAVENOUS | Status: AC
  Administered 2015-04-12: 0.25 mg via INTRAVENOUS
  Filled 2015-04-12: qty 5

## 2015-04-12 MED ORDER — HEPARIN SOD (PORK) LOCK FLUSH 100 UNIT/ML IV SOLN
500.0000 [IU] | Freq: Once | INTRAVENOUS | Status: AC | PRN
  Filled 2015-04-12: qty 5

## 2015-04-12 MED ORDER — POTASSIUM CHLORIDE 2 MEQ/ML IV SOLN
Freq: Once | INTRAVENOUS | Status: AC
  Administered 2015-04-12: 12:00:00 via INTRAVENOUS
  Filled 2015-04-12: qty 10

## 2015-04-26 ENCOUNTER — Inpatient Hospital Stay: Payer: PPO

## 2015-04-26 ENCOUNTER — Inpatient Hospital Stay: Payer: PPO | Attending: Oncology | Admitting: Oncology

## 2015-04-26 VITALS — BP 131/77 | HR 65 | Temp 97.2°F | Resp 16 | Wt 185.8 lb

## 2015-04-26 DIAGNOSIS — I252 Old myocardial infarction: Secondary | ICD-10-CM | POA: Diagnosis not present

## 2015-04-26 DIAGNOSIS — R531 Weakness: Secondary | ICD-10-CM | POA: Insufficient documentation

## 2015-04-26 DIAGNOSIS — R630 Anorexia: Secondary | ICD-10-CM

## 2015-04-26 DIAGNOSIS — R5383 Other fatigue: Secondary | ICD-10-CM | POA: Insufficient documentation

## 2015-04-26 DIAGNOSIS — Z923 Personal history of irradiation: Secondary | ICD-10-CM

## 2015-04-26 DIAGNOSIS — I2510 Atherosclerotic heart disease of native coronary artery without angina pectoris: Secondary | ICD-10-CM

## 2015-04-26 DIAGNOSIS — Z9221 Personal history of antineoplastic chemotherapy: Secondary | ICD-10-CM | POA: Insufficient documentation

## 2015-04-26 DIAGNOSIS — C689 Malignant neoplasm of urinary organ, unspecified: Secondary | ICD-10-CM | POA: Diagnosis not present

## 2015-04-26 DIAGNOSIS — D6481 Anemia due to antineoplastic chemotherapy: Secondary | ICD-10-CM

## 2015-04-26 DIAGNOSIS — E1165 Type 2 diabetes mellitus with hyperglycemia: Secondary | ICD-10-CM | POA: Insufficient documentation

## 2015-04-26 DIAGNOSIS — Z7982 Long term (current) use of aspirin: Secondary | ICD-10-CM

## 2015-04-26 DIAGNOSIS — I10 Essential (primary) hypertension: Secondary | ICD-10-CM

## 2015-04-26 DIAGNOSIS — C787 Secondary malignant neoplasm of liver and intrahepatic bile duct: Secondary | ICD-10-CM

## 2015-04-26 DIAGNOSIS — Z79899 Other long term (current) drug therapy: Secondary | ICD-10-CM | POA: Insufficient documentation

## 2015-04-26 DIAGNOSIS — Z5111 Encounter for antineoplastic chemotherapy: Secondary | ICD-10-CM | POA: Insufficient documentation

## 2015-04-26 DIAGNOSIS — E785 Hyperlipidemia, unspecified: Secondary | ICD-10-CM

## 2015-04-26 LAB — CBC
HCT: 31.5 % — ABNORMAL LOW (ref 40.0–52.0)
Hemoglobin: 10.1 g/dL — ABNORMAL LOW (ref 13.0–18.0)
MCH: 27.6 pg (ref 26.0–34.0)
MCHC: 32.2 g/dL (ref 32.0–36.0)
MCV: 85.7 fL (ref 80.0–100.0)
PLATELETS: 388 10*3/uL (ref 150–440)
RBC: 3.68 MIL/uL — AB (ref 4.40–5.90)
RDW: 18 % — AB (ref 11.5–14.5)
WBC: 7.9 10*3/uL (ref 3.8–10.6)

## 2015-04-26 LAB — COMPREHENSIVE METABOLIC PANEL
ALBUMIN: 3 g/dL — AB (ref 3.5–5.0)
ALT: 12 U/L — ABNORMAL LOW (ref 17–63)
ANION GAP: 5 (ref 5–15)
AST: 16 U/L (ref 15–41)
Alkaline Phosphatase: 98 U/L (ref 38–126)
BUN: 14 mg/dL (ref 6–20)
CALCIUM: 8.4 mg/dL — AB (ref 8.9–10.3)
CO2: 25 mmol/L (ref 22–32)
Chloride: 99 mmol/L — ABNORMAL LOW (ref 101–111)
Creatinine, Ser: 0.87 mg/dL (ref 0.61–1.24)
GFR calc Af Amer: >60 mL/min (ref >60)
GFR calc non Af Amer: >60 mL/min (ref >60)
Glucose, Bld: 238 mg/dL — ABNORMAL HIGH (ref 65–99)
Potassium: 4.2 mmol/L (ref 3.5–5.1)
Sodium: 129 mmol/L — ABNORMAL LOW (ref 135–145)
TOTAL PROTEIN: 5.9 g/dL — AB (ref 6.5–8.1)
Total Bilirubin: 0.3 mg/dL (ref 0.3–1.2)

## 2015-04-26 MED ORDER — HEPARIN SOD (PORK) LOCK FLUSH 100 UNIT/ML IV SOLN
500.0000 [IU] | Freq: Once | INTRAVENOUS | Status: AC | PRN
  Administered 2015-04-26: 500 [IU]
  Filled 2015-04-26: qty 5

## 2015-04-26 MED ORDER — SODIUM CHLORIDE 0.9 % IJ SOLN
10.0000 mL | INTRAMUSCULAR | Status: DC | PRN
  Administered 2015-04-26: 10 mL
  Filled 2015-04-26: qty 10

## 2015-04-26 MED ORDER — SODIUM CHLORIDE 0.9 % IV SOLN
Freq: Once | INTRAVENOUS | Status: AC
  Administered 2015-04-26: 10:00:00 via INTRAVENOUS
  Filled 2015-04-26: qty 1000

## 2015-04-26 MED ORDER — PROCHLORPERAZINE MALEATE 10 MG PO TABS: 10.0000 mg | ORAL_TABLET | Freq: Four times a day (QID) | ORAL | Status: DC | PRN

## 2015-04-26 NOTE — Progress Notes (Signed)
[place]  [phone] Fax:(336) [phone]  ID: Louis Alexander OB: [birth year]  MR#: [id number]  EPP#:[id number]  Patient Care Team: Birdie Sons, MD as PCP - General (Family Medicine)  CHIEF COMPLAINT:  Chief Complaint  Patient presents with  . Follow-up    Urothelial cancer  . Chemotherapy    INTERVAL HISTORY: Patient returns to clinic for further evaluation and consideration of cycle 3, day 8 of gemcitabine and cisplatin. His pain is better controlled after completing his XRT and on his current narcotic regimen. He currently feels well.  He has no neurologic complaints. He denies any recent fevers. He denies any chest pain or shortness of breath. He denies any nausea, vomiting, constipation, or diarrhea. He has no urinary complaints. Patient otherwise feels well and offers no further specific complaints today.   REVIEW OF SYSTEMS:   Review of Systems  Constitutional: Negative.   Respiratory: Negative.   Cardiovascular: Negative.   Musculoskeletal:       Pelvic pain    As per HPI. Otherwise, a complete review of systems is negatve.  PAST MEDICAL HISTORY: Past Medical History  Diagnosis Date  . Abnormal EKG   . Coronary artery disease   . Diabetes mellitus without complication   . Obesity   . Hyperlipidemia   . Hypertension   . 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 Sleep apnea     could not tolerate cpap mask  . Arthritis     oa  . Cancer     bladder and prostate  . History of radiation therapy 2012  . History of chemotherapy 2014  . Bladder cancer     PAST SURGICAL HISTORY: Past Surgical History  Procedure Laterality Date  . Cardiac catheterization  12-06-2003  . Lad stent  2000 x1 stent, 2008 x 1 stent, 2012 x 1 stent  . Ptca  2001  . Hernia repair  yrs ago  . Appendectomy  many yrs ago  . Rotator cuff repair Left yrs ago  . Seed implant to prostate with radiation  2012  . Protatectomy and urostomy  2014    . Total hip arthroplasty Right 12/27/2013    Procedure: RIGHT TOTAL HIP ARTHROPLASTY ANTERIOR APPROACH;  Surgeon: Mauri Pole, MD;  Location: [facility];  Service: Orthopedics;  Laterality: Right;    FAMILY HISTORY: Reviewed and unchanged. No report of malignancy or chronic disease.     ADVANCED DIRECTIVES:    HEALTH MAINTENANCE: History  Substance Use Topics  . Smoking status: Never Smoker   . Smokeless tobacco: Never Used  . Alcohol Use: Yes     Comment: occasional     Colonoscopy:  PAP:  Bone density:  Lipid panel:  Allergies  Allergen Reactions  . Celebrex [Celecoxib]     Other reaction(s): Bleeding Bleeding in bladder  . Effient [Prasugrel] Other (See Comments)    Other reaction(s): Bleeding Bleeding in bladder    Current Outpatient Prescriptions  Medication Sig Dispense Refill  . aspirin EC 81 MG tablet Take 81 mg by mouth daily.    [name] atenolol (TENORMIN) 25 MG tablet Take 25 mg by mouth every morning.    [name] atorvastatin (LIPITOR) 40 MG tablet Take 40 mg by mouth every evening.    . clopidogrel (PLAVIX) 75 MG tablet Take 75 mg by mouth daily with breakfast.    . Coenzyme Q10 10 MG capsule Take 10 mg by mouth.    . docusate sodium 100  MG CAPS Take 100 mg by mouth 2 (two) times daily. 10 capsule 0  . fentaNYL (DURAGESIC - DOSED MCG/HR) 75 MCG/HR Place 75 mcg onto the skin every 3 (three) days.    . ferrous sulfate 325 (65 FE) MG tablet Take 1 tablet (325 mg total) by mouth 3 (three) times daily after meals.  3  . glipiZIDE (GLUCOTROL) 10 MG tablet Take 10 mg by mouth daily before breakfast.    . lidocaine-prilocaine (EMLA) cream Apply 1 application topically once.    . metFORMIN (GLUCOPHAGE) 500 MG tablet Take 500 mg by mouth 2 (two) times daily with a meal.    . Multiple Vitamin (MULTIVITAMIN WITH MINERALS) TABS tablet Take 1 tablet by mouth daily.    . Oxycodone HCl 10 MG TABS Take 10 mg by mouth every 6 (six) hours.    . polyethylene glycol (MIRALAX / GLYCOLAX)  packet Take 17 g by mouth 2 (two) times daily. 14 each 0  . prochlorperazine (COMPAZINE) 10 MG tablet TAKE 1 TAB(S) BY MOUTH  EVERY 6 HOURS, AS NEEDED 40 tablet 0  . tiZANidine (ZANAFLEX) 4 MG tablet Take 1 tablet (4 mg total) by mouth every 6 (six) hours as needed for muscle spasms. 30 tablet 0  . HYDROcodone-acetaminophen (NORCO) 7.5-325 MG per tablet Take 1-2 tablets by mouth every 4 (four) hours as needed for moderate pain. (Patient not taking: Reported on 04/12/2015) 100 tablet 0  . megestrol (MEGACE ES) 625 MG/5ML suspension Take 5 mLs (625 mg total) by mouth daily. 150 mL 0   No current facility-administered medications for this visit.   Facility-Administered Medications Ordered in Other Visits  Medication Dose Route Frequency Provider Last Rate Last Dose  . chlorhexidine (HIBICLENS) 4 % liquid 4 application  60 mL Topical Once Danae Orleans, PA-C       And  . chlorhexidine (HIBICLENS) 4 % liquid 4 application  60 mL Topical Once Danae Orleans, PA-C        OBJECTIVE: Filed Vitals:   04/12/15 1043  BP: 160/83  Pulse: 55  Temp: 95.3 F (35.2 C)  Resp: 16     Body mass index is 27.14 kg/(m^2).    ECOG FS:1 - Symptomatic but completely ambulatory  General: Well-developed, well-nourished, no acute distress. Eyes: anicteric sclera. Lungs: Clear to auscultation bilaterally. Heart: Regular rate and rhythm. No rubs, murmurs, or gallops. Abdomen: Soft, nontender, nondistended. No organomegaly noted, normoactive bowel sounds. Musculoskeletal: No edema, cyanosis, or clubbing. Neuro: Alert, answering all questions appropriately. Cranial nerves grossly intact. Skin: No rashes or petechiae noted. Psych: Normal affect.    LAB RESULTS:  Lab Results  Component Value Date   NA 130* 04/12/2015   K 4.3 04/12/2015   CL 100* 04/12/2015   CO2 19* 04/12/2015   GLUCOSE 196* 04/12/2015   BUN 18 04/12/2015   CREATININE 0.83 04/12/2015   CALCIUM 8.3* 04/12/2015   PROT 6.1* 04/12/2015    ALBUMIN 3.0* 04/12/2015   AST 27 04/12/2015   ALT 14* 04/12/2015   ALKPHOS 97 04/12/2015   BILITOT 0.7 04/12/2015   GFRNONAA >60 04/12/2015   GFRAA >60 04/12/2015    Lab Results  Component Value Date   WBC 7.9 04/26/2015   NEUTROABS 4.7 04/12/2015   HGB 10.1* 04/26/2015   HCT 31.5* 04/26/2015   MCV 85.7 04/26/2015   PLT 388 04/26/2015     STUDIES: No results found.  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  PLAN:    1.  Urothelial  carcinoma: PET scan results reviewed independently confirming metastatic disease. Proceed with cycle 3, day 8 of Gemcitabine 1000mg /m2 and Cisplatinum 35mg /m2. Patient has now completed his palliative XRT. Patient will receive this regimen on days 1, and 8 with day 15 off. Return to clinic in 2 weeks for consideration of cycle 4, day 1. Plan to reimage at the conclusion of cycle 4. If patient progresses within one year, continue consider newly approved atezolizumab.   2. Urethral pain: Significantly improved. Secondary to malignancy, patient has now completed XRT. Continue fentanyl as prescribed. Oxycodone dose recently increased for better pain control. 3. Hyperglycemia: Patient is diabetic. Continue glipizide and metformin as prescribed. Monitor.  Patient expressed understanding and was in agreement with this plan. He also understands that He can call clinic at any time with any questions, concerns, or complaints.   Urothelial cancer   Staging form: Kidney, AJCC 7th Edition     Clinical stage from 03/16/2015: Stage IV (TX, N1, M1) - Signed by Lloyd Huger, MD on 03/16/2015   Lloyd Huger, MD   04/26/2015 9:26 AM

## 2015-05-01 ENCOUNTER — Other Ambulatory Visit: Payer: Self-pay | Admitting: *Deleted

## 2015-05-01 DIAGNOSIS — C689 Malignant neoplasm of urinary organ, unspecified: Secondary | ICD-10-CM

## 2015-05-02 ENCOUNTER — Inpatient Hospital Stay (HOSPITAL_BASED_OUTPATIENT_CLINIC_OR_DEPARTMENT_OTHER): Payer: PPO | Admitting: Oncology

## 2015-05-02 ENCOUNTER — Inpatient Hospital Stay: Payer: PPO

## 2015-05-02 VITALS — BP 162/84 | HR 60 | Temp 96.7°F | Resp 16 | Wt 191.8 lb

## 2015-05-02 DIAGNOSIS — C689 Malignant neoplasm of urinary organ, unspecified: Secondary | ICD-10-CM | POA: Diagnosis not present

## 2015-05-02 DIAGNOSIS — D6481 Anemia due to antineoplastic chemotherapy: Secondary | ICD-10-CM

## 2015-05-02 DIAGNOSIS — Z9221 Personal history of antineoplastic chemotherapy: Secondary | ICD-10-CM

## 2015-05-02 DIAGNOSIS — I10 Essential (primary) hypertension: Secondary | ICD-10-CM

## 2015-05-02 DIAGNOSIS — R5383 Other fatigue: Secondary | ICD-10-CM

## 2015-05-02 DIAGNOSIS — E785 Hyperlipidemia, unspecified: Secondary | ICD-10-CM

## 2015-05-02 DIAGNOSIS — Z5111 Encounter for antineoplastic chemotherapy: Secondary | ICD-10-CM | POA: Diagnosis not present

## 2015-05-02 DIAGNOSIS — I252 Old myocardial infarction: Secondary | ICD-10-CM

## 2015-05-02 DIAGNOSIS — E1165 Type 2 diabetes mellitus with hyperglycemia: Secondary | ICD-10-CM | POA: Diagnosis not present

## 2015-05-02 DIAGNOSIS — Z7982 Long term (current) use of aspirin: Secondary | ICD-10-CM

## 2015-05-02 DIAGNOSIS — R531 Weakness: Secondary | ICD-10-CM

## 2015-05-02 DIAGNOSIS — R630 Anorexia: Secondary | ICD-10-CM

## 2015-05-02 DIAGNOSIS — C787 Secondary malignant neoplasm of liver and intrahepatic bile duct: Secondary | ICD-10-CM | POA: Diagnosis not present

## 2015-05-02 DIAGNOSIS — Z923 Personal history of irradiation: Secondary | ICD-10-CM

## 2015-05-02 DIAGNOSIS — Z79899 Other long term (current) drug therapy: Secondary | ICD-10-CM

## 2015-05-02 DIAGNOSIS — I2510 Atherosclerotic heart disease of native coronary artery without angina pectoris: Secondary | ICD-10-CM

## 2015-05-02 LAB — COMPREHENSIVE METABOLIC PANEL
ALBUMIN: 3.5 g/dL (ref 3.5–5.0)
ALK PHOS: 91 U/L (ref 38–126)
ALT: 13 U/L — AB (ref 17–63)
AST: 17 U/L (ref 15–41)
Anion gap: 5 (ref 5–15)
BUN: 20 mg/dL (ref 6–20)
CO2: 22 mmol/L (ref 22–32)
Calcium: 8.6 mg/dL — ABNORMAL LOW (ref 8.9–10.3)
Chloride: 104 mmol/L (ref 101–111)
Creatinine, Ser: 0.94 mg/dL (ref 0.61–1.24)
GFR calc Af Amer: >60 mL/min (ref >60)
GFR calc non Af Amer: >60 mL/min (ref >60)
Glucose, Bld: 249 mg/dL — ABNORMAL HIGH (ref 65–99)
POTASSIUM: 4.8 mmol/L (ref 3.5–5.1)
SODIUM: 131 mmol/L — AB (ref 135–145)
TOTAL PROTEIN: 6.6 g/dL (ref 6.5–8.1)
Total Bilirubin: 0.5 mg/dL (ref 0.3–1.2)

## 2015-05-02 LAB — CBC WITH DIFFERENTIAL/PLATELET
BASOS PCT: 1 %
Basophils Absolute: 0.1 10*3/uL (ref 0–0.1)
Eosinophils Absolute: 0.2 10*3/uL (ref 0–0.7)
Eosinophils Relative: 1 %
HEMATOCRIT: 34.5 % — AB (ref 40.0–52.0)
HEMOGLOBIN: 10.8 g/dL — AB (ref 13.0–18.0)
LYMPHS ABS: 2.7 10*3/uL (ref 1.0–3.6)
LYMPHS PCT: 22 %
MCH: 27.6 pg (ref 26.0–34.0)
MCHC: 31.3 g/dL — AB (ref 32.0–36.0)
MCV: 88.1 fL (ref 80.0–100.0)
MONOS PCT: 14 %
Monocytes Absolute: 1.7 10*3/uL — ABNORMAL HIGH (ref 0.2–1.0)
NEUTROS ABS: 7.5 10*3/uL — AB (ref 1.4–6.5)
NEUTROS PCT: 62 %
Platelets: 489 10*3/uL — ABNORMAL HIGH (ref 150–440)
RBC: 3.92 MIL/uL — AB (ref 4.40–5.90)
RDW: 20.3 % — ABNORMAL HIGH (ref 11.5–14.5)
WBC: 12.2 10*3/uL — AB (ref 3.8–10.6)

## 2015-05-02 NOTE — Progress Notes (Signed)
Patient's pain is well controlled on the Fentanyl Patch 76mcg Q3days.

## 2015-05-02 NOTE — Progress Notes (Addendum)
[place]  Telephone:(336) [phone] Fax:(336) [phone]  ID: [name] OB: June 09, 1941  MR#: [id number]  GDJ#:[id number]  Patient Care Team: Birdie Sons, MD as PCP - General (Family Medicine)  CHIEF COMPLAINT:  Chief Complaint  Patient presents with  . Follow-up    Urothelial cancer    INTERVAL HISTORY: Patient returns to clinic for further evaluation and consideration of cycle 4, day 1 of gemcitabine and cisplatin. He feels more weak and fatigued and has had a decreased appetite. His pain is well controlled after completing his XRT and on his current narcotic regimen. He currently feels well.  He has no neurologic complaints. He denies any recent fevers. He denies any chest pain or shortness of breath. He denies any nausea, vomiting, constipation, or diarrhea. He has no urinary complaints. Patient offers no specific complaints today.   REVIEW OF SYSTEMS:   Review of Systems  Constitutional: Negative.   Respiratory: Negative.   Cardiovascular: Negative.   Musculoskeletal: Negative.        Pelvic pain, resolved    As per HPI. Otherwise, a complete review of systems is negatve.  PAST MEDICAL HISTORY: Past Medical History  Diagnosis Date  . Abnormal EKG   . Coronary artery disease   . Diabetes mellitus without complication   . Obesity   . Hyperlipidemia   . Hypertension   . 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 Sleep apnea     could not tolerate cpap mask  . Arthritis     oa  . Cancer     bladder and prostate  . History of radiation therapy 2012  . History of chemotherapy 2014  . Bladder cancer     PAST SURGICAL HISTORY: Past Surgical History  Procedure Laterality Date  . Cardiac catheterization  12-06-2003  . Lad stent  2000 x1 stent, 2008 x 1 stent, 2012 x 1 stent  . Ptca  2001  . Hernia repair  yrs ago  . Appendectomy  many yrs ago  . Rotator cuff repair Left yrs ago  . Seed implant to prostate with radiation  2012  .  Protatectomy and urostomy  2014  . Total hip arthroplasty Right 12/27/2013    Procedure: RIGHT TOTAL HIP ARTHROPLASTY ANTERIOR APPROACH;  Surgeon: Mauri Pole, MD;  Location: [facility];  Service: Orthopedics;  Laterality: Right;    FAMILY HISTORY: Reviewed and unchanged. No report of malignancy or chronic disease.     ADVANCED DIRECTIVES:    HEALTH MAINTENANCE: History  Substance Use Topics  . Smoking status: Never Smoker   . Smokeless tobacco: Never Used  . Alcohol Use: Yes     Comment: occasional     Colonoscopy:  PAP:  Bone density:  Lipid panel:  Allergies  Allergen Reactions  . Celebrex [Celecoxib]     Other reaction(s): Bleeding Bleeding in bladder  . Effient [Prasugrel] Other (See Comments)    Other reaction(s): Bleeding Bleeding in bladder    Current Outpatient Prescriptions  Medication Sig Dispense Refill  . aspirin EC 81 MG tablet Take 81 mg by mouth daily.    [name] atenolol (TENORMIN) 25 MG tablet Take 25 mg by mouth every morning.    [name] atorvastatin (LIPITOR) 40 MG tablet Take 40 mg by mouth every evening.    . clopidogrel (PLAVIX) 75 MG tablet Take 75 mg by mouth daily with breakfast.    . Coenzyme Q10 10 MG capsule Take 10 mg by mouth.    [name]  docusate sodium 100 MG CAPS Take 100 mg by mouth 2 (two) times daily. 10 capsule 0  . fentaNYL (DURAGESIC - DOSED MCG/HR) 75 MCG/HR Place 75 mcg onto the skin every 3 (three) days.    . ferrous sulfate 325 (65 FE) MG tablet Take 1 tablet (325 mg total) by mouth 3 (three) times daily after meals.  3  . glipiZIDE (GLUCOTROL) 10 MG tablet Take 10 mg by mouth daily before breakfast.    . lidocaine-prilocaine (EMLA) cream Apply 1 application topically once.    . megestrol (MEGACE ES) 625 MG/5ML suspension Take 5 mLs (625 mg total) by mouth daily. 150 mL 0  . metFORMIN (GLUCOPHAGE) 500 MG tablet Take 500 mg by mouth 2 (two) times daily with a meal.    . Multiple Vitamin (MULTIVITAMIN WITH MINERALS) TABS tablet Take 1 tablet by  mouth daily.    . prochlorperazine (COMPAZINE) 10 MG tablet Take 1 tablet (10 mg total) by mouth every 6 (six) hours as needed for nausea or vomiting. 40 tablet 2  . tiZANidine (ZANAFLEX) 4 MG tablet Take 1 tablet (4 mg total) by mouth every 6 (six) hours as needed for muscle spasms. 30 tablet 0  . HYDROcodone-acetaminophen (NORCO) 7.5-325 MG per tablet Take 1-2 tablets by mouth every 4 (four) hours as needed for moderate pain. (Patient not taking: Reported on 04/12/2015) 100 tablet 0  . Oxycodone HCl 10 MG TABS Take 10 mg by mouth every 6 (six) hours.    . polyethylene glycol (MIRALAX / GLYCOLAX) packet Take 17 g by mouth 2 (two) times daily. (Patient not taking: Reported on 04/26/2015) 14 each 0   No current facility-administered medications for this visit.   Facility-Administered Medications Ordered in Other Visits  Medication Dose Route Frequency Provider Last Rate Last Dose  . chlorhexidine (HIBICLENS) 4 % liquid 4 application  60 mL Topical Once Danae Orleans, PA-C       And  . chlorhexidine (HIBICLENS) 4 % liquid 4 application  60 mL Topical Once Danae Orleans, PA-C        OBJECTIVE: Filed Vitals:   04/26/15 0949  BP: 131/77  Pulse: 65  Temp: 97.2 F (36.2 C)  Resp: 16     Body mass index is 26.61 kg/(m^2).    ECOG FS:1 - Symptomatic but completely ambulatory  General: Well-developed, well-nourished, no acute distress. Eyes: anicteric sclera. Lungs: Clear to auscultation bilaterally. Heart: Regular rate and rhythm. No rubs, murmurs, or gallops. Abdomen: Soft, nontender, nondistended. No organomegaly noted, normoactive bowel sounds. Musculoskeletal: No edema, cyanosis, or clubbing. Neuro: Alert, answering all questions appropriately. Cranial nerves grossly intact. Skin: No rashes or petechiae noted. Psych: Normal affect.    LAB RESULTS:  Lab Results  Component Value Date   NA 129* 04/26/2015   K 4.2 04/26/2015   CL 99* 04/26/2015   CO2 25 04/26/2015   GLUCOSE 238*  04/26/2015   BUN 14 04/26/2015   CREATININE 0.87 04/26/2015   CALCIUM 8.4* 04/26/2015   PROT 5.9* 04/26/2015   ALBUMIN 3.0* 04/26/2015   AST 16 04/26/2015   ALT 12* 04/26/2015   ALKPHOS 98 04/26/2015   BILITOT 0.3 04/26/2015   GFRNONAA >60 04/26/2015   GFRAA >60 04/26/2015    Lab Results  Component Value Date   WBC 7.9 04/26/2015   NEUTROABS 4.7 04/12/2015   HGB 10.1* 04/26/2015   HCT 31.5* 04/26/2015   MCV 85.7 04/26/2015   PLT 388 04/26/2015     STUDIES: No results found.  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  PLAN:    1.  Urothelial carcinoma: PET scan results reviewed independently confirming metastatic disease. Delay cycle 4 day 1 of Gemcitabine 1000mg /m2 and Cisplatinum 35mg /m2 today secondary to weakness and fatigue. Patient has now completed his palliative XRT. Patient will receive this regimen on days 1, and 8 with day 15 off. Return to clinic in 1 week for reconsideration of cycle 4, day 1. Plan to reimage at the conclusion of cycle 4.  If patient progresses within one year, continue consider newly approved atezolizumab.   2. Urethral pain: Significantly improved. Secondary to malignancy, patient has now completed XRT. Continue fentanyl and oxycodone as prescribed.  3. Hyperglycemia: Patient is diabetic. Continue glipizide and metformin as prescribed. Monitor. 4. Anemia: Secondary to chemotherapy, monitor. 5. Weakness and fatigue: Patient will receive IV fluids. Delay chemotherapy as above.  Patient expressed understanding and was in agreement with this plan. He also understands that He can call clinic at any time with any questions, concerns, or complaints.   Urothelial cancer   Staging form: Kidney, AJCC 7th Edition     Clinical stage from 03/16/2015: Stage IV (TX, N1, M1) - Signed by Lloyd Huger, MD on 03/16/2015   Lloyd Huger, MD   05/02/2015 8:51 AM

## 2015-05-03 ENCOUNTER — Ambulatory Visit: Payer: PPO | Admitting: Radiation Oncology

## 2015-05-03 ENCOUNTER — Encounter: Payer: Self-pay | Admitting: Radiation Oncology

## 2015-05-03 ENCOUNTER — Inpatient Hospital Stay: Payer: PPO

## 2015-05-03 ENCOUNTER — Ambulatory Visit
Admission: RE | Admit: 2015-05-03 | Discharge: 2015-05-03 | Disposition: A | Discharge status: Home / Self Care | Payer: PPO | Source: Ambulatory Visit | Attending: Radiation Oncology | Admitting: Radiation Oncology

## 2015-05-03 VITALS — BP 142/77 | HR 58 | Temp 95.6°F | Resp 18 | Wt 192.4 lb

## 2015-05-03 DIAGNOSIS — C7910 Secondary malignant neoplasm of unspecified urinary organs: Secondary | ICD-10-CM

## 2015-05-03 DIAGNOSIS — Z5111 Encounter for antineoplastic chemotherapy: Secondary | ICD-10-CM | POA: Diagnosis not present

## 2015-05-03 DIAGNOSIS — C689 Malignant neoplasm of urinary organ, unspecified: Secondary | ICD-10-CM

## 2015-05-03 MED ORDER — SODIUM CHLORIDE 0.9 % IV SOLN
Freq: Once | INTRAVENOUS | Status: AC
  Administered 2015-05-03: 12:00:00 via INTRAVENOUS
  Filled 2015-05-03: qty 5

## 2015-05-03 MED ORDER — CISPLATIN CHEMO INJECTION 100MG/100ML
35.0000 mg/m2 | Freq: Once | INTRAVENOUS | Status: AC
  Administered 2015-05-03: 75 mg via INTRAVENOUS
  Filled 2015-05-03: qty 75

## 2015-05-03 MED ORDER — DEXTROSE-NACL 5-0.45 % IV SOLN
Freq: Once | INTRAVENOUS | Status: AC
  Administered 2015-05-03: 10:00:00 via INTRAVENOUS
  Filled 2015-05-03: qty 1000

## 2015-05-03 MED ORDER — SODIUM CHLORIDE 0.9 % IV SOLN
Freq: Once | INTRAVENOUS | Status: AC
  Administered 2015-05-03: 10:00:00 via INTRAVENOUS
  Filled 2015-05-03: qty 1000

## 2015-05-03 MED ORDER — PALONOSETRON HCL INJECTION 0.25 MG/5ML
0.2500 mg | Freq: Once | INTRAVENOUS | Status: AC
  Administered 2015-05-03: 0.25 mg via INTRAVENOUS
  Filled 2015-05-03: qty 5

## 2015-05-03 MED ORDER — SODIUM CHLORIDE 0.9 % IV SOLN
1000.0000 mg/m2 | Freq: Once | INTRAVENOUS | Status: AC
  Administered 2015-05-03: 2128 mg via INTRAVENOUS
  Filled 2015-05-03: qty 50.71

## 2015-05-03 MED ORDER — HEPARIN SOD (PORK) LOCK FLUSH 100 UNIT/ML IV SOLN
500.0000 [IU] | Freq: Once | INTRAVENOUS | Status: AC | PRN
  Administered 2015-05-03: 500 [IU]
  Filled 2015-05-03: qty 5

## 2015-05-03 MED ORDER — SODIUM CHLORIDE 0.9 % IJ SOLN
10.0000 mL | INTRAMUSCULAR | Status: DC | PRN
  Administered 2015-05-03: 10 mL
  Filled 2015-05-03: qty 10

## 2015-05-03 NOTE — Progress Notes (Signed)
Radiation Oncology Follow up Note  Name: Louis Alexander   Date:   05/03/2015 MRN:  [id number] DOB: [birth year]    This 74 y.o. male presents to the clinic today for stage IV widespread metastatic bladder cancer 1 month out completion of palliative radiation therapy to his right hip.  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 His major complaint was right hip pain and penile pain PET/CT demonstrated widespread metastatic disease. He is now 1 month out of palliative radiation therapy to his right hip and is doing well pain is completely responded. His penile pain is also improved and he is currently on. Gemcitabine cis-platinum with good response. He is seen today in routine follow-up.  COMPLICATIONS OF TREATMENT: none  FOLLOW UP COMPLIANCE: keeps appointments   PHYSICAL EXAM:  BP 142/77 mmHg  Pulse 58  Temp(Src) 95.6 F (35.3 C)  Resp 18  Wt 192 lb 5.6 oz (87.25 kg) Range of motion of his right lower extremity does not elicit pain motor sensory and DTR levels in the lower extremity are equal symmetric. Deep palpation of the spine does not elicit pain.  Well-developed well-nourished patient in NAD. HEENT reveals PERLA, EOMI, discs not visualized.  Oral cavity is clear. No oral mucosal lesions are identified. Neck is clear without evidence of cervical or supraclavicular adenopathy. Lungs are clear to A&P. Cardiac examination is essentially unremarkable with regular rate and rhythm without murmur rub or thrill. Abdomen is benign with no organomegaly or masses noted. Motor sensory and DTR levels are equal and symmetric in the upper and lower extremities. Cranial nerves II through XII are grossly intact. Proprioception is intact. No peripheral adenopathy or edema is identified. No motor or sensory levels are noted. Crude visual fields  are within normal range.   RADIOLOGY RESULTS: No recent radiology reports are available for review.   PLAN: At the present time he is achieved excellent palliation to his right hip from palliative radiation. I am turning follow-up care over to medical oncology. Would be happy to reevaluate the patient any time should further palliative treatment be indicated. Patient knows to call with any concerns.  I would like to take this opportunity for allowing me to participate in the care of your patient.Armstead Peaks., MD

## 2015-05-04 ENCOUNTER — Other Ambulatory Visit: Payer: Self-pay | Admitting: Oncology

## 2015-05-04 DIAGNOSIS — C689 Malignant neoplasm of urinary organ, unspecified: Secondary | ICD-10-CM

## 2015-05-04 NOTE — Progress Notes (Signed)
[facility]  [phone] Fax:(336) [phone]  ID: Louis Alexander OB: Jun 09, 1941  MR#: [id number]  QGB#:[id number]  Patient Care Team: Birdie Sons, MD as PCP - General (Family Medicine)  CHIEF COMPLAINT:  Chief Complaint  Patient presents with  . Follow-up    Urothelial carcinoma    INTERVAL HISTORY: Patient returns to clinic for further evaluation and reconsideration of cycle 4, day 1 of gemcitabine and cisplatin. He feels significantly improved and improved and is back to his baseline. His pain is well controlled after completing his XRT. He is no longer taking narcotics. He has no neurologic complaints. He denies any recent fevers. He denies any chest pain or shortness of breath. He denies any nausea, vomiting, constipation, or diarrhea. He has no urinary complaints. Patient offers no specific complaints today.   REVIEW OF SYSTEMS:   Review of Systems  Constitutional: Negative.   Respiratory: Negative.   Cardiovascular: Negative.   Musculoskeletal: Negative.        Pelvic pain, resolved    As per HPI. Otherwise, a complete review of systems is negatve.  PAST MEDICAL HISTORY: Past Medical History  Diagnosis Date  . Abnormal EKG   . Coronary artery disease   . Diabetes mellitus without complication   . Obesity   . Hyperlipidemia   . Hypertension   . 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 Sleep apnea     could not tolerate cpap mask  . Arthritis     oa  . Cancer     bladder and prostate  . History of radiation therapy 2012  . History of chemotherapy 2014  . Bladder cancer     PAST SURGICAL HISTORY: Past Surgical History  Procedure Laterality Date  . Cardiac catheterization  12-06-2003  . Lad stent  2000 x1 stent, 2008 x 1 stent, 2012 x 1 stent  . Ptca  2001  . Hernia repair  yrs ago  . Appendectomy  many yrs ago  . Rotator cuff repair Left yrs ago  . Seed implant to prostate with radiation  2012  . Protatectomy  and urostomy  2014  . Total hip arthroplasty Right 12/27/2013    Procedure: RIGHT TOTAL HIP ARTHROPLASTY ANTERIOR APPROACH;  Surgeon: Mauri Pole, MD;  Location: [facility];  Service: Orthopedics;  Laterality: Right;    FAMILY HISTORY: Reviewed and unchanged. No report of malignancy or chronic disease.     ADVANCED DIRECTIVES:    HEALTH MAINTENANCE: History  Substance Use Topics  . Smoking status: Never Smoker   . Smokeless tobacco: Never Used  . Alcohol Use: Yes     Comment: occasional     Colonoscopy:  PAP:  Bone density:  Lipid panel:  Allergies  Allergen Reactions  . Celebrex [Celecoxib]     Other reaction(s): Bleeding Bleeding in bladder  . Effient [Prasugrel] Other (See Comments)    Other reaction(s): Bleeding Bleeding in bladder    Current Outpatient Prescriptions  Medication Sig Dispense Refill  . aspirin EC 81 MG tablet Take 81 mg by mouth daily.    [name] atenolol (TENORMIN) 25 MG tablet Take 25 mg by mouth every morning.    [name] atorvastatin (LIPITOR) 40 MG tablet Take 40 mg by mouth every evening.    . clopidogrel (PLAVIX) 75 MG tablet Take 75 mg by mouth daily with breakfast.    . Coenzyme Q10 10 MG capsule Take 10 mg by mouth.    . docusate sodium  100 MG CAPS Take 100 mg by mouth 2 (two) times daily. 10 capsule 0  . fentaNYL (DURAGESIC - DOSED MCG/HR) 50 MCG/HR Place 50 mcg onto the skin every 3 (three) days.    . ferrous sulfate 325 (65 FE) MG tablet Take 1 tablet (325 mg total) by mouth 3 (three) times daily after meals.  3  . glipiZIDE (GLUCOTROL) 10 MG tablet Take 10 mg by mouth daily before breakfast.    . lidocaine-prilocaine (EMLA) cream Apply 1 application topically once.    . megestrol (MEGACE ES) 625 MG/5ML suspension Take 5 mLs (625 mg total) by mouth daily. 150 mL 0  . metFORMIN (GLUCOPHAGE) 500 MG tablet Take 500 mg by mouth 2 (two) times daily with a meal.    . Multiple Vitamin (MULTIVITAMIN WITH MINERALS) TABS tablet Take 1 tablet by mouth daily.      . polyethylene glycol (MIRALAX / GLYCOLAX) packet Take 17 g by mouth 2 (two) times daily. 14 each 0  . prochlorperazine (COMPAZINE) 10 MG tablet Take 1 tablet (10 mg total) by mouth every 6 (six) hours as needed for nausea or vomiting. 40 tablet 2  . tiZANidine (ZANAFLEX) 4 MG tablet Take 1 tablet (4 mg total) by mouth every 6 (six) hours as needed for muscle spasms. 30 tablet 0   No current facility-administered medications for this visit.   Facility-Administered Medications Ordered in Other Visits  Medication Dose Route Frequency Provider Last Rate Last Dose  . chlorhexidine (HIBICLENS) 4 % liquid 4 application  60 mL Topical Once Danae Orleans, PA-C       And  . chlorhexidine (HIBICLENS) 4 % liquid 4 application  60 mL Topical Once Danae Orleans, PA-C        OBJECTIVE: Filed Vitals:   05/02/15 1202  BP: 162/84  Pulse: 60  Temp: 96.7 F (35.9 C)  Resp: 16     Body mass index is 27.46 kg/(m^2).    ECOG FS:0 - Asymptomatic  General: Well-developed, well-nourished, no acute distress. Eyes: anicteric sclera. Lungs: Clear to auscultation bilaterally. Heart: Regular rate and rhythm. No rubs, murmurs, or gallops. Abdomen: Soft, nontender, nondistended. No organomegaly noted, normoactive bowel sounds. Musculoskeletal: No edema, cyanosis, or clubbing. Neuro: Alert, answering all questions appropriately. Cranial nerves grossly intact. Skin: No rashes or petechiae noted. Psych: Normal affect.    LAB RESULTS:  Lab Results  Component Value Date   NA 131* 05/02/2015   K 4.8 05/02/2015   CL 104 05/02/2015   CO2 22 05/02/2015   GLUCOSE 249* 05/02/2015   BUN 20 05/02/2015   CREATININE 0.94 05/02/2015   CALCIUM 8.6* 05/02/2015   PROT 6.6 05/02/2015   ALBUMIN 3.5 05/02/2015   AST 17 05/02/2015   ALT 13* 05/02/2015   ALKPHOS 91 05/02/2015   BILITOT 0.5 05/02/2015   GFRNONAA >60 05/02/2015   GFRAA >60 05/02/2015    Lab Results  Component Value Date   WBC 12.2* 05/02/2015    NEUTROABS 7.5* 05/02/2015   HGB 10.8* 05/02/2015   HCT 34.5* 05/02/2015   MCV 88.1 05/02/2015   PLT 489* 05/02/2015     STUDIES: No results found.  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  PLAN:    1.  Urothelial carcinoma: PET scan results reviewed independently confirming metastatic disease. Proceed with cycle 4 day 1 of Gemcitabine 1000mg /m2 and Cisplatinum 35mg /m2 today. Patient has now completed his palliative XRT. Patient will receive this regimen on days 1 and 8 with day 15 off.  Return to clinic in 1 week for consideration of cycle 4, day 8. Plan to reimage approximately 4-6 weeks after the conclusion of cycle 4.  If patient progresses within one year, continue consider newly approved atezolizumab.   2. Urethral pain: Resolved. Secondary to malignancy, patient has now completed XRT. Continue fentanyl patch. Patient has discontinued oxycodone. He has expressed interest in tapering off his fentanyl patch as well. 3. Hyperglycemia: Patient is diabetic. Continue glipizide and metformin as prescribed. Monitor. 4. Anemia: Secondary to chemotherapy, monitor. 5. Weakness and fatigue: Resolved.  Patient expressed understanding and was in agreement with this plan. He also understands that He can call clinic at any time with any questions, concerns, or complaints.   Urothelial cancer   Staging form: Kidney, AJCC 7th Edition     Clinical stage from 03/16/2015: Stage IV (TX, N1, M1) - Signed by Lloyd Huger, MD on 03/16/2015   Lloyd Huger, MD   05/04/2015 5:07 PM

## 2015-05-09 ENCOUNTER — Inpatient Hospital Stay: Payer: PPO

## 2015-05-09 ENCOUNTER — Inpatient Hospital Stay (HOSPITAL_BASED_OUTPATIENT_CLINIC_OR_DEPARTMENT_OTHER): Payer: PPO | Admitting: Family Medicine

## 2015-05-09 VITALS — BP 153/74 | HR 74 | Temp 96.7°F

## 2015-05-09 VITALS — BP 149/83 | HR 65 | Temp 98.3°F | Resp 18 | Ht 70.0 in | Wt 183.6 lb

## 2015-05-09 DIAGNOSIS — E1165 Type 2 diabetes mellitus with hyperglycemia: Secondary | ICD-10-CM | POA: Diagnosis not present

## 2015-05-09 DIAGNOSIS — C787 Secondary malignant neoplasm of liver and intrahepatic bile duct: Secondary | ICD-10-CM

## 2015-05-09 DIAGNOSIS — I2510 Atherosclerotic heart disease of native coronary artery without angina pectoris: Secondary | ICD-10-CM

## 2015-05-09 DIAGNOSIS — R5383 Other fatigue: Secondary | ICD-10-CM

## 2015-05-09 DIAGNOSIS — I10 Essential (primary) hypertension: Secondary | ICD-10-CM

## 2015-05-09 DIAGNOSIS — C689 Malignant neoplasm of urinary organ, unspecified: Secondary | ICD-10-CM | POA: Diagnosis not present

## 2015-05-09 DIAGNOSIS — R531 Weakness: Secondary | ICD-10-CM

## 2015-05-09 DIAGNOSIS — Z923 Personal history of irradiation: Secondary | ICD-10-CM

## 2015-05-09 DIAGNOSIS — Z7982 Long term (current) use of aspirin: Secondary | ICD-10-CM

## 2015-05-09 DIAGNOSIS — D6481 Anemia due to antineoplastic chemotherapy: Secondary | ICD-10-CM

## 2015-05-09 DIAGNOSIS — Z5111 Encounter for antineoplastic chemotherapy: Secondary | ICD-10-CM | POA: Diagnosis not present

## 2015-05-09 DIAGNOSIS — Z79899 Other long term (current) drug therapy: Secondary | ICD-10-CM

## 2015-05-09 DIAGNOSIS — E785 Hyperlipidemia, unspecified: Secondary | ICD-10-CM

## 2015-05-09 DIAGNOSIS — I252 Old myocardial infarction: Secondary | ICD-10-CM

## 2015-05-09 DIAGNOSIS — R630 Anorexia: Secondary | ICD-10-CM

## 2015-05-09 DIAGNOSIS — Z9221 Personal history of antineoplastic chemotherapy: Secondary | ICD-10-CM

## 2015-05-09 LAB — CBC WITH DIFFERENTIAL/PLATELET
Basophils Absolute: 0 10*3/uL (ref 0–0.1)
Basophils Relative: 1 %
EOS ABS: 0 10*3/uL (ref 0–0.7)
Eosinophils Relative: 1 %
HCT: 33.1 % — ABNORMAL LOW (ref 40.0–52.0)
Hemoglobin: 10.8 g/dL — ABNORMAL LOW (ref 13.0–18.0)
LYMPHS ABS: 1.8 10*3/uL (ref 1.0–3.6)
LYMPHS PCT: 24 %
MCH: 28.5 pg (ref 26.0–34.0)
MCHC: 32.7 g/dL (ref 32.0–36.0)
MCV: 87.2 fL (ref 80.0–100.0)
MONOS PCT: 5 %
Monocytes Absolute: 0.4 10*3/uL (ref 0.2–1.0)
Neutro Abs: 5.2 10*3/uL (ref 1.4–6.5)
Neutrophils Relative %: 69 %
PLATELETS: 214 10*3/uL (ref 150–440)
RBC: 3.8 MIL/uL — ABNORMAL LOW (ref 4.40–5.90)
RDW: 18.7 % — ABNORMAL HIGH (ref 11.5–14.5)
WBC: 7.5 10*3/uL (ref 3.8–10.6)

## 2015-05-09 LAB — COMPREHENSIVE METABOLIC PANEL
ALT: 14 U/L — ABNORMAL LOW (ref 17–63)
ANION GAP: 6 (ref 5–15)
AST: 17 U/L (ref 15–41)
Albumin: 3.3 g/dL — ABNORMAL LOW (ref 3.5–5.0)
Alkaline Phosphatase: 79 U/L (ref 38–126)
BILIRUBIN TOTAL: 0.6 mg/dL (ref 0.3–1.2)
BUN: 25 mg/dL — AB (ref 6–20)
CO2: 23 mmol/L (ref 22–32)
Calcium: 8.6 mg/dL — ABNORMAL LOW (ref 8.9–10.3)
Chloride: 101 mmol/L (ref 101–111)
Creatinine, Ser: 0.94 mg/dL (ref 0.61–1.24)
Glucose, Bld: 271 mg/dL — ABNORMAL HIGH (ref 65–99)
Potassium: 4.6 mmol/L (ref 3.5–5.1)
SODIUM: 130 mmol/L — AB (ref 135–145)
Total Protein: 6.5 g/dL (ref 6.5–8.1)

## 2015-05-09 MED ORDER — SODIUM CHLORIDE 0.9 % IV SOLN
Freq: Once | INTRAVENOUS | Status: AC
  Administered 2015-05-09: 10:00:00 via INTRAVENOUS
  Filled 2015-05-09: qty 1000

## 2015-05-09 MED ORDER — SODIUM CHLORIDE 0.9 % IV SOLN
35.0000 mg/m2 | Freq: Once | INTRAVENOUS | Status: AC
  Administered 2015-05-09: 75 mg via INTRAVENOUS
  Filled 2015-05-09: qty 75

## 2015-05-09 MED ORDER — PALONOSETRON HCL INJECTION 0.25 MG/5ML
0.2500 mg | Freq: Once | INTRAVENOUS | Status: AC
  Administered 2015-05-09: 0.25 mg via INTRAVENOUS
  Filled 2015-05-09: qty 5

## 2015-05-09 MED ORDER — SODIUM CHLORIDE 0.9 % IV SOLN
Freq: Once | INTRAVENOUS | Status: AC
  Administered 2015-05-09: 12:00:00 via INTRAVENOUS
  Filled 2015-05-09: qty 5

## 2015-05-09 MED ORDER — SODIUM CHLORIDE 0.9 % IJ SOLN
10.0000 mL | INTRAMUSCULAR | Status: DC | PRN
  Administered 2015-05-09: 10 mL
  Filled 2015-05-09: qty 10

## 2015-05-09 MED ORDER — HEPARIN SOD (PORK) LOCK FLUSH 100 UNIT/ML IV SOLN
500.0000 [IU] | Freq: Once | INTRAVENOUS | Status: AC | PRN
Start: 2015-05-09 — End: 2015-05-09
  Administered 2015-05-09: 500 [IU]

## 2015-05-09 MED ORDER — POTASSIUM CHLORIDE 2 MEQ/ML IV SOLN
Freq: Once | INTRAVENOUS | Status: AC
  Administered 2015-05-09: 10:00:00 via INTRAVENOUS
  Filled 2015-05-09: qty 1000

## 2015-05-09 MED ORDER — HEPARIN SOD (PORK) LOCK FLUSH 100 UNIT/ML IV SOLN
250.0000 [IU] | Freq: Once | INTRAVENOUS | Status: AC | PRN
  Filled 2015-05-09: qty 5

## 2015-05-09 MED ORDER — SODIUM CHLORIDE 0.9 % IJ SOLN
3.0000 mL | INTRAMUSCULAR | Status: DC | PRN
  Filled 2015-05-09: qty 10

## 2015-05-09 MED ORDER — SODIUM CHLORIDE 0.9 % IV SOLN
1000.0000 mg/m2 | Freq: Once | INTRAVENOUS | Status: AC
  Administered 2015-05-09: 2128 mg via INTRAVENOUS
  Filled 2015-05-09: qty 52.6

## 2015-05-09 NOTE — Progress Notes (Signed)
[place]  [phone]) [phone] [phone]) [phone]  ID: [name] OB: 13-Jan-1941  MR#: [id number]  JOI#:[id number]  Patient Care Team: Birdie Sons, MD as PCP - General (Family Medicine)  CHIEF COMPLAINT:  Chief Complaint  Patient presents with  . Follow-up    here for chemotherapy treatment today-cisplatin and gemzar    INTERVAL HISTORY:  Patient returns to clinic for further evaluation and reconsideration of cycle 4, day 8 of gemcitabine and cisplatin. He feels significantly improved and is back to his baseline. His pain is well controlled after completing his XRT. He is no longer taking narcotics. He has no neurologic complaints. He denies any recent fevers. He denies any chest pain or shortness of breath. He denies any nausea, vomiting, constipation, or diarrhea. He has no urinary complaints. Patient offers no specific complaints today.   REVIEW OF SYSTEMS:   Review of Systems  Constitutional: Negative.   Respiratory: Negative.   Cardiovascular: Negative.   Musculoskeletal: Negative.        Pelvic pain, resolved    As per HPI. Otherwise, a complete review of systems is negatve.  PAST MEDICAL HISTORY: Past Medical History  Diagnosis Date  . Abnormal EKG   . Coronary artery disease   . Diabetes mellitus without complication   . Obesity   . Hyperlipidemia   . Hypertension   . 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 Sleep apnea     could not tolerate cpap mask  . Arthritis     oa  . Cancer     bladder and prostate  . History of radiation therapy 2012  . History of chemotherapy 2014  . Bladder cancer     PAST SURGICAL HISTORY: Past Surgical History  Procedure Laterality Date  . Cardiac catheterization  12-06-2003  . Lad stent  2000 x1 stent, 2008 x 1 stent, 2012 x 1 stent  . Ptca  2001  . Hernia repair  yrs ago  . Appendectomy  many yrs ago  . Rotator cuff repair Left yrs ago  . Seed implant to prostate with  radiation  2012  . Protatectomy and urostomy  2014  . Total hip arthroplasty Right 12/27/2013    Procedure: RIGHT TOTAL HIP ARTHROPLASTY ANTERIOR APPROACH;  Surgeon: Mauri Pole, MD;  Location: [facility];  Service: Orthopedics;  Laterality: Right;    FAMILY HISTORY: Reviewed and unchanged. No report of malignancy or chronic disease.     ADVANCED DIRECTIVES:    HEALTH MAINTENANCE: History  Substance Use Topics  . Smoking status: Never Smoker   . Smokeless tobacco: Never Used  . Alcohol Use: Yes     Comment: occasional     Colonoscopy:  PAP:  Bone density:  Lipid panel:  Allergies  Allergen Reactions  . Celebrex [Celecoxib]     Other reaction(s): Bleeding Bleeding in bladder  . Effient [Prasugrel] Other (See Comments)    Other reaction(s): Bleeding Bleeding in bladder    Current Outpatient Prescriptions  Medication Sig Dispense Refill  . aspirin EC 81 MG tablet Take 81 mg by mouth daily.    [name] atenolol (TENORMIN) 25 MG tablet Take 25 mg by mouth every morning.    [name] atorvastatin (LIPITOR) 40 MG tablet Take 40 mg by mouth every evening.    . clopidogrel (PLAVIX) 75 MG tablet Take 75 mg by mouth daily with breakfast.    . Coenzyme Q10 10 MG capsule Take 10 mg by mouth.    [name]  docusate sodium 100 MG CAPS Take 100 mg by mouth 2 (two) times daily. 10 capsule 0  . fentaNYL (DURAGESIC - DOSED MCG/HR) 50 MCG/HR Place 50 mcg onto the skin every 3 (three) days.    [name] glipiZIDE (GLUCOTROL) 10 MG tablet Take 10 mg by mouth daily before breakfast.    . lidocaine-prilocaine (EMLA) cream Apply 1 application topically once.    . megestrol (MEGACE ES) 625 MG/5ML suspension Take 5 mLs (625 mg total) by mouth daily. 150 mL 0  . metFORMIN (GLUCOPHAGE) 500 MG tablet Take 500 mg by mouth 2 (two) times daily with a meal.    . Multiple Vitamin (MULTIVITAMIN WITH MINERALS) TABS tablet Take 1 tablet by mouth daily.     No current facility-administered medications for this visit.    Facility-Administered Medications Ordered in Other Visits  Medication Dose Route Frequency Provider Last Rate Last Dose  . chlorhexidine (HIBICLENS) 4 % liquid 4 application  60 mL Topical Once Danae Orleans, PA-C       And  . chlorhexidine (HIBICLENS) 4 % liquid 4 application  60 mL Topical Once Danae Orleans, PA-C      . CISplatin (PLATINOL) 75 mg in sodium chloride 0.9 % 250 mL chemo infusion  35 mg/m2 (Treatment Plan Actual) Intravenous Once Lloyd Huger, MD      . dextrose 5 % and 0.45% NaCl 1,000 mL with potassium chloride 20 mEq, magnesium sulfate 12 mEq infusion   Intravenous Once Lloyd Huger, MD      . fosaprepitant (EMEND) 150 mg, dexamethasone (DECADRON) 12 mg in sodium chloride 0.9 % 145 mL IVPB   Intravenous Once Lloyd Huger, MD      . gemcitabine (GEMZAR) 2,128 mg in sodium chloride 0.9 % 250 mL chemo infusion  1,000 mg/m2 (Treatment Plan Actual) Intravenous Once Lloyd Huger, MD      . heparin lock flush 100 unit/mL  500 Units Intracatheter Once PRN Lloyd Huger, MD      . heparin lock flush 100 unit/mL  250 Units Intracatheter Once PRN Lloyd Huger, MD      . palonosetron (ALOXI) injection 0.25 mg  0.25 mg Intravenous Once Lloyd Huger, MD      . sodium chloride 0.9 % injection 10 mL  10 mL Intracatheter PRN Lloyd Huger, MD   10 mL at 05/09/15 0900  . sodium chloride 0.9 % injection 3 mL  3 mL Intravenous PRN Lloyd Huger, MD        OBJECTIVE: Filed Vitals:   05/09/15 0915  BP: 149/83  Pulse: 65  Temp: 98.3 F (36.8 C)  Resp: 18     Body mass index is 26.35 kg/(m^2).    ECOG FS:0 - Asymptomatic  General: Well-developed, well-nourished, no acute distress. Eyes: anicteric sclera. Lungs: Clear to auscultation bilaterally. Heart: Regular rate and rhythm. No rubs, murmurs, or gallops. Abdomen: Soft, nontender, nondistended. No organomegaly noted, normoactive bowel sounds. Musculoskeletal: No edema, cyanosis, or  clubbing. Neuro: Alert, answering all questions appropriately. Cranial nerves grossly intact. Skin: No rashes or petechiae noted. Psych: Normal affect.    LAB RESULTS:  Lab Results  Component Value Date   NA 130* 05/09/2015   K 4.6 05/09/2015   CL 101 05/09/2015   CO2 23 05/09/2015   GLUCOSE 271* 05/09/2015   BUN 25* 05/09/2015   CREATININE 0.94 05/09/2015   CALCIUM 8.6* 05/09/2015   PROT 6.5 05/09/2015   ALBUMIN 3.3* 05/09/2015   AST 17  05/09/2015   ALT 14* 05/09/2015   ALKPHOS 79 05/09/2015   BILITOT 0.6 05/09/2015   GFRNONAA >60 05/09/2015   GFRAA >60 05/09/2015    Lab Results  Component Value Date   WBC 7.5 05/09/2015   NEUTROABS 5.2 05/09/2015   HGB 10.8* 05/09/2015   HCT 33.1* 05/09/2015   MCV 87.2 05/09/2015   PLT 214 05/09/2015     STUDIES: No results found.  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  PLAN:    1.  Urothelial carcinoma: PET scan results reviewed independently confirming metastatic disease. Proceed with cycle 4 day 1 of Gemcitabine 1000mg /m2 and Cisplatinum 35mg /m2 today. Patient has now completed his palliative XRT. Patient will receive this regimen on days 1 and 8 with day 15 off. Return to clinic in 1 week for consideration of cycle 4, day 8. Plan to reimage approximately 4-6 weeks after the conclusion of cycle 4.  If patient progresses within one year, continue consider newly approved atezolizumab.   2. Urethral pain: Resolved. Secondary to malignancy, patient has now completed XRT. Continue fentanyl patch. Patient has discontinued oxycodone. He has expressed interest in tapering off his fentanyl patch as well. 3. Hyperglycemia: Patient is diabetic. Continue glipizide and metformin as prescribed. Monitor. 4. Anemia: Secondary to chemotherapy, monitor. 5. Weakness and fatigue: Resolved.  Patient expressed understanding and was in agreement with this plan. He also understands that He can call clinic at any  time with any questions, concerns, or complaints.   Urothelial cancer   Staging form: Kidney, AJCC 7th Edition     Clinical stage from 03/16/2015: Stage IV (TX, N1, M1) - Signed by Lloyd Huger, MD on 03/16/2015   Evlyn Kanner, NP   05/09/2015 10:07 AM     [place]  [phone]) [phone]  Fax:(336) [place] DOB: 08-Mar-1941  MR#: [id number]  VCB#:[id number]  Patient Care Team: Birdie Sons, MD as PCP - General (Family Medicine)  CHIEF COMPLAINT:  Chief Complaint  Patient presents with  . Follow-up    here for chemotherapy treatment today-cisplatin and gemzar    INTERVAL HISTORY:    REVIEW OF SYSTEMS:   Review of Systems  Constitutional: Negative.   HENT: Negative.   Eyes: Negative.   Respiratory: Negative.   Cardiovascular: Negative.   Gastrointestinal: Negative.   Genitourinary: Negative.   Musculoskeletal: Negative.   Skin: Negative.   Neurological: Negative.   Psychiatric/Behavioral: Negative.     As per HPI. Otherwise, a complete review of systems is negatve.  ONCOLOGY HISTORY:  No history exists.    PAST MEDICAL HISTORY: Past Medical History  Diagnosis Date  . Abnormal EKG   . Coronary artery disease   . Diabetes mellitus without complication   . Obesity   . Hyperlipidemia   . Hypertension   . 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 Sleep apnea     could not tolerate cpap mask  . Arthritis     oa  . Cancer     bladder and prostate  . History of radiation therapy 2012  . History of chemotherapy 2014  . Bladder cancer     PAST SURGICAL HISTORY: Past Surgical History  Procedure Laterality Date  . Cardiac catheterization  12-06-2003  . Lad stent  2000 x1 stent, 2008 x 1 stent, 2012 x 1 stent  . Ptca  2001  . Hernia repair  yrs ago  . Appendectomy  many yrs ago  . Rotator cuff repair Left yrs ago  . Seed implant to prostate with radiation  2012  . Protatectomy and urostomy  2014    . Total hip arthroplasty Right 12/27/2013    Procedure: RIGHT TOTAL HIP ARTHROPLASTY ANTERIOR APPROACH;  Surgeon: Mauri Pole, MD;  Location: [facility];  Service: Orthopedics;  Laterality: Right;    FAMILY HISTORY No family history on file.  GYNECOLOGIC HISTORY:  No LMP for male patient.     ADVANCED DIRECTIVES:    HEALTH MAINTENANCE: History  Substance Use Topics  . Smoking status: Never Smoker   . Smokeless tobacco: Never Used  . Alcohol Use: Yes     Comment: occasional     Colonoscopy:  PAP:  Bone density:  Lipid panel:  Allergies  Allergen Reactions  . Celebrex [Celecoxib]     Other reaction(s): Bleeding Bleeding in bladder  . Effient [Prasugrel] Other (See Comments)    Other reaction(s): Bleeding Bleeding in bladder    Current Outpatient Prescriptions  Medication Sig Dispense Refill  . aspirin EC 81 MG tablet Take 81 mg by mouth daily.    [name] atenolol (TENORMIN) 25 MG tablet Take 25 mg by mouth every morning.    [name] atorvastatin (LIPITOR) 40 MG tablet Take 40 mg by mouth every evening.    . clopidogrel (PLAVIX) 75 MG tablet Take 75 mg by mouth daily with breakfast.    . Coenzyme Q10 10 MG capsule Take 10 mg by mouth.    . docusate sodium 100 MG CAPS Take 100 mg by mouth 2 (two) times daily. 10 capsule 0  . fentaNYL (DURAGESIC - DOSED MCG/HR) 50 MCG/HR Place 50 mcg onto the skin every 3 (three) days.    [name] glipiZIDE (GLUCOTROL) 10 MG tablet Take 10 mg by mouth daily before breakfast.    . lidocaine-prilocaine (EMLA) cream Apply 1 application topically once.    . megestrol (MEGACE ES) 625 MG/5ML suspension Take 5 mLs (625 mg total) by mouth daily. 150 mL 0  . metFORMIN (GLUCOPHAGE) 500 MG tablet Take 500 mg by mouth 2 (two) times daily with a meal.    . Multiple Vitamin (MULTIVITAMIN WITH MINERALS) TABS tablet Take 1 tablet by mouth daily.     No current facility-administered medications for this visit.   Facility-Administered Medications Ordered in Other Visits   Medication Dose Route Frequency Provider Last Rate Last Dose  . chlorhexidine (HIBICLENS) 4 % liquid 4 application  60 mL Topical Once Danae Orleans, PA-C       And  . chlorhexidine (HIBICLENS) 4 % liquid 4 application  60 mL Topical Once Danae Orleans, PA-C      . CISplatin (PLATINOL) 75 mg in sodium chloride 0.9 % 250 mL chemo infusion  35 mg/m2 (Treatment Plan Actual) Intravenous Once Lloyd Huger, MD      . dextrose 5 % and 0.45% NaCl 1,000 mL with potassium chloride 20 mEq, magnesium sulfate 12 mEq infusion   Intravenous Once Lloyd Huger, MD      . fosaprepitant (EMEND) 150 mg, dexamethasone (DECADRON) 12 mg in sodium chloride 0.9 % 145 mL IVPB   Intravenous Once Lloyd Huger, MD      . gemcitabine (GEMZAR) 2,128 mg in sodium chloride 0.9 % 250 mL chemo infusion  1,000 mg/m2 (Treatment Plan Actual) Intravenous Once Lloyd Huger, MD      . heparin lock flush 100 unit/mL  500 Units Intracatheter Once PRN Lloyd Huger, MD      .  heparin lock flush 100 unit/mL  250 Units Intracatheter Once PRN Lloyd Huger, MD      . palonosetron (ALOXI) injection 0.25 mg  0.25 mg Intravenous Once Lloyd Huger, MD      . sodium chloride 0.9 % injection 10 mL  10 mL Intracatheter PRN Lloyd Huger, MD   10 mL at 05/09/15 0900  . sodium chloride 0.9 % injection 3 mL  3 mL Intravenous PRN Lloyd Huger, MD        OBJECTIVE: BP 149/83 mmHg  Pulse 65  Temp(Src) 98.3 F (36.8 C)  Resp 18  Ht 5\' 10"  (1.778 m)  Wt 183 lb 10.3 oz (83.3 kg)  BMI 26.35 kg/m2   Body mass index is 26.35 kg/(m^2).    ECOG FS:0 - Asymptomatic  General: Well-developed, well-nourished, no acute distress. Eyes: Pink conjunctiva, anicteric sclera. HEENT: Normocephalic, moist mucous membranes, clear oropharnyx. Lungs: Clear to auscultation bilaterally. Heart: Regular rate and rhythm. No rubs, murmurs, or gallops. Abdomen: Soft, nontender, nondistended. No organomegaly noted,  normoactive bowel sounds. Musculoskeletal: No edema, cyanosis, or clubbing. Neuro: Alert, answering all questions appropriately. Cranial nerves grossly intact. Skin: No rashes or petechiae noted. Psych: Normal affect.  LAB RESULTS:     Component Value Date/Time   NA 130* 05/09/2015 0855   NA 133* 03/15/2015 0930   K 4.6 05/09/2015 0855   K 4.5 03/15/2015 0930   CL 101 05/09/2015 0855   CL 100* 03/15/2015 0930   CO2 23 05/09/2015 0855   CO2 26 03/15/2015 0930   GLUCOSE 271* 05/09/2015 0855   GLUCOSE 179* 03/15/2015 0930   BUN 25* 05/09/2015 0855   BUN 21* 03/15/2015 0930   CREATININE 0.94 05/09/2015 0855   CREATININE 0.96 03/15/2015 0930   CALCIUM 8.6* 05/09/2015 0855   CALCIUM 8.7* 03/15/2015 0930   PROT 6.5 05/09/2015 0855   PROT 6.6 03/15/2015 0930   ALBUMIN 3.3* 05/09/2015 0855   ALBUMIN 3.3* 03/15/2015 0930   AST 17 05/09/2015 0855   AST 20 03/15/2015 0930   ALT 14* 05/09/2015 0855   ALT 20 03/15/2015 0930   ALKPHOS 79 05/09/2015 0855   ALKPHOS 105 03/15/2015 0930   BILITOT 0.6 05/09/2015 0855   GFRNONAA >60 05/09/2015 0855   GFRNONAA >60 03/15/2015 0930   GFRAA >60 05/09/2015 0855   GFRAA >60 03/15/2015 0930    No results found for: SPEP, UPEP  Lab Results  Component Value Date   WBC 7.5 05/09/2015   NEUTROABS 5.2 05/09/2015   HGB 10.8* 05/09/2015   HCT 33.1* 05/09/2015   MCV 87.2 05/09/2015   PLT 214 05/09/2015      Chemistry      Component Value Date/Time   NA 130* 05/09/2015 0855   NA 133* 03/15/2015 0930   K 4.6 05/09/2015 0855   K 4.5 03/15/2015 0930   CL 101 05/09/2015 0855   CL 100* 03/15/2015 0930   CO2 23 05/09/2015 0855   CO2 26 03/15/2015 0930   BUN 25* 05/09/2015 0855   BUN 21* 03/15/2015 0930   CREATININE 0.94 05/09/2015 0855   CREATININE 0.96 03/15/2015 0930      Component Value Date/Time   CALCIUM 8.6* 05/09/2015 0855   CALCIUM 8.7* 03/15/2015 0930   ALKPHOS 79 05/09/2015 0855   ALKPHOS 105 03/15/2015 0930   AST 17  05/09/2015 0855   AST 20 03/15/2015 0930   ALT 14* 05/09/2015 0855   ALT 20 03/15/2015 0930   BILITOT 0.6 05/09/2015 0855  No results found for: LABCA2  No components found for: [id number]  No results for input(s): INR in the last 168 hours.     Component Value Date/Time   COLORURINE YELLOW 12/21/2013 0821   APPEARANCEUR CLOUDY* 12/21/2013 0821   LABSPEC 1.022 12/21/2013 0821   PHURINE 7.5 12/21/2013 0821   GLUCOSEU >1000* 12/21/2013 0821   HGBUR MODERATE* 12/21/2013 0821   BILIRUBINUR NEGATIVE 12/21/2013 0821   KETONESUR NEGATIVE 12/21/2013 0821   PROTEINUR NEGATIVE 12/21/2013 0821   UROBILINOGEN 0.2 12/21/2013 0821   NITRITE POSITIVE* 12/21/2013 0821   LEUKOCYTESUR MODERATE* 12/21/2013 0821    STUDIES: No results found.  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 Urothelial carcinoma: PET scan results reviewed independently confirming metastatic disease. Proceed with cycle 4 day 8 of Gemcitabine 1000mg /m2 and Cisplatinum 35mg /m2 today. Patient has now completed his palliative XRT. Patient will receive this regimen on days 1 and 8 with day 15 off. Return to clinic in 1 week for consideration of cycle 4, day 8. Plan to reimage approximately 4-6 weeks after the conclusion of cycle 4. If patient progresses within one year, continue consider newly approved atezolizumab.  2. Urethral pain: Resolved. Secondary to malignancy, patient has now completed XRT. Continue fentanyl patch. Patient has discontinued oxycodone. He has expressed interest in tapering off his fentanyl patch as well.  3. Hyperglycemia: Patient is diabetic. Continue glipizide and metformin as prescribed. Monitor. 4. Anemia: Secondary to chemotherapy, monitor. 5. Weakness and fatigue: Resolved.  Patient expressed understanding and was in agreement with this plan. He also understands that He can call clinic at any time with any questions, concerns, or complaints.      Urothelial cancer   Staging form: Kidney, AJCC 7th Edition     Clinical stage from 03/16/2015: Stage IV (TX, N1, M1) - Signed by Lloyd Huger, MD on 03/16/2015   Evlyn Kanner, NP   05/09/2015 10:07 AM

## 2015-05-15 ENCOUNTER — Telehealth: Payer: Self-pay | Admitting: *Deleted

## 2015-05-15 DIAGNOSIS — Z5189 Encounter for other specified aftercare: Secondary | ICD-10-CM

## 2015-05-15 NOTE — Telephone Encounter (Signed)
Per Dr Grayland Ormond pt can come in for lab today and IVF tomorrow or lab IVF tomorrow and if labs ok consider xray for PE . Pt prefers to get lab IVF tomorrow

## 2015-05-16 ENCOUNTER — Inpatient Hospital Stay: Payer: PPO

## 2015-05-16 DIAGNOSIS — Z5189 Encounter for other specified aftercare: Secondary | ICD-10-CM

## 2015-05-16 DIAGNOSIS — C787 Secondary malignant neoplasm of liver and intrahepatic bile duct: Principal | ICD-10-CM

## 2015-05-16 DIAGNOSIS — Z5111 Encounter for antineoplastic chemotherapy: Secondary | ICD-10-CM | POA: Diagnosis not present

## 2015-05-16 DIAGNOSIS — C679 Malignant neoplasm of bladder, unspecified: Secondary | ICD-10-CM

## 2015-05-16 LAB — CBC WITH DIFFERENTIAL/PLATELET
BASOS ABS: 0 10*3/uL (ref 0–0.1)
Basophils Relative: 0 %
EOS PCT: 0 %
Eosinophils Absolute: 0 10*3/uL (ref 0–0.7)
HCT: 31.3 % — ABNORMAL LOW (ref 40.0–52.0)
Hemoglobin: 10.3 g/dL — ABNORMAL LOW (ref 13.0–18.0)
LYMPHS PCT: 22 %
Lymphs Abs: 1.5 10*3/uL (ref 1.0–3.6)
MCH: 28.5 pg (ref 26.0–34.0)
MCHC: 32.8 g/dL (ref 32.0–36.0)
MCV: 87 fL (ref 80.0–100.0)
Monocytes Absolute: 0.4 10*3/uL (ref 0.2–1.0)
Monocytes Relative: 5 %
Neutro Abs: 5 10*3/uL (ref 1.4–6.5)
Neutrophils Relative %: 73 %
PLATELETS: 36 10*3/uL — AB (ref 150–440)
RBC: 3.6 MIL/uL — ABNORMAL LOW (ref 4.40–5.90)
RDW: 18.4 % — AB (ref 11.5–14.5)
WBC: 7 10*3/uL (ref 3.8–10.6)

## 2015-05-16 LAB — COMPREHENSIVE METABOLIC PANEL
ALBUMIN: 3.2 g/dL — AB (ref 3.5–5.0)
ALT: 17 U/L (ref 17–63)
AST: 23 U/L (ref 15–41)
Alkaline Phosphatase: 84 U/L (ref 38–126)
Anion gap: 8 (ref 5–15)
BILIRUBIN TOTAL: 0.5 mg/dL (ref 0.3–1.2)
BUN: 27 mg/dL — ABNORMAL HIGH (ref 6–20)
CHLORIDE: 101 mmol/L (ref 101–111)
CO2: 18 mmol/L — ABNORMAL LOW (ref 22–32)
Calcium: 8.2 mg/dL — ABNORMAL LOW (ref 8.9–10.3)
Creatinine, Ser: 1.23 mg/dL (ref 0.61–1.24)
GFR calc Af Amer: >60 mL/min (ref >60)
GFR calc non Af Amer: 57 mL/min — ABNORMAL LOW (ref >60)
Glucose, Bld: 343 mg/dL — ABNORMAL HIGH (ref 65–99)
POTASSIUM: 4.3 mmol/L (ref 3.5–5.1)
SODIUM: 127 mmol/L — AB (ref 135–145)
TOTAL PROTEIN: 6.3 g/dL — AB (ref 6.5–8.1)

## 2015-05-16 MED ORDER — SODIUM CHLORIDE 0.9 % IV SOLN
Freq: Once | INTRAVENOUS | Status: AC
  Administered 2015-05-16: 10:00:00 via INTRAVENOUS
  Filled 2015-05-16: qty 1000

## 2015-05-16 MED ORDER — HEPARIN SOD (PORK) LOCK FLUSH 100 UNIT/ML IV SOLN
500.0000 [IU] | Freq: Once | INTRAVENOUS | Status: AC
  Administered 2015-05-16: 500 [IU] via INTRAVENOUS

## 2015-05-16 MED ORDER — SODIUM CHLORIDE 0.9 % IV SOLN
10.0000 mg | Freq: Once | INTRAVENOUS | Status: AC
  Administered 2015-05-16: 10 mg via INTRAVENOUS
  Filled 2015-05-16: qty 1

## 2015-05-17 ENCOUNTER — Other Ambulatory Visit: Payer: Self-pay | Admitting: Oncology

## 2015-05-28 ENCOUNTER — Inpatient Hospital Stay: Payer: PPO | Attending: Oncology

## 2015-05-28 ENCOUNTER — Ambulatory Visit: Payer: PPO

## 2015-05-28 ENCOUNTER — Telehealth: Payer: Self-pay | Admitting: *Deleted

## 2015-05-28 VITALS — BP 113/68 | HR 73 | Temp 98.2°F | Resp 24

## 2015-05-28 DIAGNOSIS — Z7982 Long term (current) use of aspirin: Secondary | ICD-10-CM | POA: Diagnosis not present

## 2015-05-28 DIAGNOSIS — R5383 Other fatigue: Secondary | ICD-10-CM | POA: Insufficient documentation

## 2015-05-28 DIAGNOSIS — C679 Malignant neoplasm of bladder, unspecified: Secondary | ICD-10-CM | POA: Diagnosis present

## 2015-05-28 DIAGNOSIS — Z9221 Personal history of antineoplastic chemotherapy: Secondary | ICD-10-CM | POA: Insufficient documentation

## 2015-05-28 DIAGNOSIS — E785 Hyperlipidemia, unspecified: Secondary | ICD-10-CM | POA: Diagnosis not present

## 2015-05-28 DIAGNOSIS — M129 Arthropathy, unspecified: Secondary | ICD-10-CM | POA: Diagnosis not present

## 2015-05-28 DIAGNOSIS — Z5189 Encounter for other specified aftercare: Secondary | ICD-10-CM

## 2015-05-28 DIAGNOSIS — G4730 Sleep apnea, unspecified: Secondary | ICD-10-CM | POA: Insufficient documentation

## 2015-05-28 DIAGNOSIS — I2510 Atherosclerotic heart disease of native coronary artery without angina pectoris: Secondary | ICD-10-CM | POA: Insufficient documentation

## 2015-05-28 DIAGNOSIS — R160 Hepatomegaly, not elsewhere classified: Secondary | ICD-10-CM | POA: Diagnosis not present

## 2015-05-28 DIAGNOSIS — I712 Thoracic aortic aneurysm, without rupture: Secondary | ICD-10-CM | POA: Diagnosis not present

## 2015-05-28 DIAGNOSIS — Z923 Personal history of irradiation: Secondary | ICD-10-CM | POA: Insufficient documentation

## 2015-05-28 DIAGNOSIS — R531 Weakness: Secondary | ICD-10-CM | POA: Diagnosis not present

## 2015-05-28 DIAGNOSIS — E669 Obesity, unspecified: Secondary | ICD-10-CM | POA: Insufficient documentation

## 2015-05-28 DIAGNOSIS — I10 Essential (primary) hypertension: Secondary | ICD-10-CM | POA: Diagnosis not present

## 2015-05-28 DIAGNOSIS — I517 Cardiomegaly: Secondary | ICD-10-CM | POA: Insufficient documentation

## 2015-05-28 DIAGNOSIS — E1165 Type 2 diabetes mellitus with hyperglycemia: Secondary | ICD-10-CM | POA: Insufficient documentation

## 2015-05-28 DIAGNOSIS — I252 Old myocardial infarction: Secondary | ICD-10-CM | POA: Insufficient documentation

## 2015-05-28 DIAGNOSIS — Z79899 Other long term (current) drug therapy: Secondary | ICD-10-CM | POA: Insufficient documentation

## 2015-05-28 LAB — COMPREHENSIVE METABOLIC PANEL
ALBUMIN: 3.2 g/dL — AB (ref 3.5–5.0)
ALT: 14 U/L — ABNORMAL LOW (ref 17–63)
ANION GAP: 8 (ref 5–15)
AST: 17 U/L (ref 15–41)
Alkaline Phosphatase: 82 U/L (ref 38–126)
BUN: 27 mg/dL — ABNORMAL HIGH (ref 6–20)
CALCIUM: 8.2 mg/dL — AB (ref 8.9–10.3)
CO2: 20 mmol/L — AB (ref 22–32)
CREATININE: 1.13 mg/dL (ref 0.61–1.24)
Chloride: 101 mmol/L (ref 101–111)
GFR calc Af Amer: >60 mL/min (ref >60)
GFR calc non Af Amer: >60 mL/min (ref >60)
GLUCOSE: 335 mg/dL — AB (ref 65–99)
Potassium: 4 mmol/L (ref 3.5–5.1)
SODIUM: 129 mmol/L — AB (ref 135–145)
Total Bilirubin: 0.5 mg/dL (ref 0.3–1.2)
Total Protein: 6 g/dL — ABNORMAL LOW (ref 6.5–8.1)

## 2015-05-28 LAB — CBC WITH DIFFERENTIAL/PLATELET
BASOS ABS: 0.1 10*3/uL (ref 0–0.1)
BASOS PCT: 1 %
EOS PCT: 2 %
Eosinophils Absolute: 0.1 10*3/uL (ref 0–0.7)
HCT: 30.8 % — ABNORMAL LOW (ref 40.0–52.0)
HEMOGLOBIN: 10 g/dL — AB (ref 13.0–18.0)
LYMPHS PCT: 20 %
Lymphs Abs: 1.7 10*3/uL (ref 1.0–3.6)
MCH: 29.1 pg (ref 26.0–34.0)
MCHC: 32.4 g/dL (ref 32.0–36.0)
MCV: 90 fL (ref 80.0–100.0)
MONO ABS: 1.2 10*3/uL — AB (ref 0.2–1.0)
MONOS PCT: 14 %
Neutro Abs: 5.7 10*3/uL (ref 1.4–6.5)
Neutrophils Relative %: 65 %
Platelets: 257 10*3/uL (ref 150–440)
RBC: 3.42 MIL/uL — AB (ref 4.40–5.90)
RDW: 21.2 % — ABNORMAL HIGH (ref 11.5–14.5)
WBC: 8.7 10*3/uL (ref 3.8–10.6)

## 2015-05-28 MED ORDER — HEPARIN SOD (PORK) LOCK FLUSH 100 UNIT/ML IV SOLN
INTRAVENOUS | Status: AC
  Filled 2015-05-28: qty 5

## 2015-05-28 MED ORDER — SODIUM CHLORIDE 0.9 % IV SOLN
Freq: Once | INTRAVENOUS | Status: AC
  Administered 2015-05-28: 11:00:00 via INTRAVENOUS
  Filled 2015-05-28: qty 1000

## 2015-05-28 NOTE — Progress Notes (Signed)
   05/28/15 0915  Clinical Encounter Type  Visited With Patient  Visit Type Initial  Consult/Referral To Chaplain  Stress Factors  Patient Stress Factors None identified  Family Stress Factors None identified  Visited with patient in the Cancer center infusion suite.  Pt said he was doing well.  Provided pastoral presence and support.  [place] [phone]

## 2015-05-28 NOTE — Telephone Encounter (Signed)
Per Dr Grayland Ormond, pt to come in for IVF and labs, Pt will be here in 15 mins per pt

## 2015-05-30 ENCOUNTER — Telehealth: Payer: Self-pay | Admitting: *Deleted

## 2015-05-30 DIAGNOSIS — Z5189 Encounter for other specified aftercare: Secondary | ICD-10-CM

## 2015-05-30 NOTE — Telephone Encounter (Signed)
Come in tomorrow for lab (cbc, metc), IVF, and MD visit.

## 2015-05-30 NOTE — Telephone Encounter (Signed)
Patient has agreed to be here at 830 tomorrow morning

## 2015-05-30 NOTE — Telephone Encounter (Signed)
He reports he is still has a lot of fatigue, and how long to expect to feel this way. Reports drinking a lot of flds and urine output is nml. Educated regarding cumlulative effect of chemo and fatigue. Asking if he could still be dehydrated

## 2015-05-31 ENCOUNTER — Inpatient Hospital Stay: Payer: PPO

## 2015-05-31 ENCOUNTER — Inpatient Hospital Stay (HOSPITAL_BASED_OUTPATIENT_CLINIC_OR_DEPARTMENT_OTHER): Payer: PPO | Admitting: Oncology

## 2015-05-31 VITALS — BP 133/80 | HR 73 | Temp 97.8°F | Wt 192.5 lb

## 2015-05-31 DIAGNOSIS — C679 Malignant neoplasm of bladder, unspecified: Secondary | ICD-10-CM | POA: Diagnosis not present

## 2015-05-31 DIAGNOSIS — E669 Obesity, unspecified: Secondary | ICD-10-CM

## 2015-05-31 DIAGNOSIS — Z7982 Long term (current) use of aspirin: Secondary | ICD-10-CM

## 2015-05-31 DIAGNOSIS — E1165 Type 2 diabetes mellitus with hyperglycemia: Secondary | ICD-10-CM

## 2015-05-31 DIAGNOSIS — I2510 Atherosclerotic heart disease of native coronary artery without angina pectoris: Secondary | ICD-10-CM

## 2015-05-31 DIAGNOSIS — Z79899 Other long term (current) drug therapy: Secondary | ICD-10-CM

## 2015-05-31 DIAGNOSIS — I10 Essential (primary) hypertension: Secondary | ICD-10-CM

## 2015-05-31 DIAGNOSIS — Z5189 Encounter for other specified aftercare: Secondary | ICD-10-CM

## 2015-05-31 DIAGNOSIS — G4730 Sleep apnea, unspecified: Secondary | ICD-10-CM

## 2015-05-31 DIAGNOSIS — I252 Old myocardial infarction: Secondary | ICD-10-CM

## 2015-05-31 DIAGNOSIS — Z9221 Personal history of antineoplastic chemotherapy: Secondary | ICD-10-CM

## 2015-05-31 DIAGNOSIS — C670 Malignant neoplasm of trigone of bladder: Secondary | ICD-10-CM

## 2015-05-31 DIAGNOSIS — I712 Thoracic aortic aneurysm, without rupture: Secondary | ICD-10-CM

## 2015-05-31 DIAGNOSIS — E785 Hyperlipidemia, unspecified: Secondary | ICD-10-CM

## 2015-05-31 DIAGNOSIS — R5383 Other fatigue: Secondary | ICD-10-CM

## 2015-05-31 DIAGNOSIS — I517 Cardiomegaly: Secondary | ICD-10-CM

## 2015-05-31 DIAGNOSIS — M129 Arthropathy, unspecified: Secondary | ICD-10-CM

## 2015-05-31 DIAGNOSIS — R531 Weakness: Secondary | ICD-10-CM

## 2015-05-31 DIAGNOSIS — R160 Hepatomegaly, not elsewhere classified: Secondary | ICD-10-CM

## 2015-05-31 DIAGNOSIS — Z923 Personal history of irradiation: Secondary | ICD-10-CM

## 2015-05-31 LAB — CBC WITH DIFFERENTIAL/PLATELET
BASOS PCT: 1 %
Basophils Absolute: 0.1 10*3/uL (ref 0–0.1)
EOS PCT: 1 %
Eosinophils Absolute: 0.1 10*3/uL (ref 0–0.7)
HEMATOCRIT: 32 % — AB (ref 40.0–52.0)
HEMOGLOBIN: 10.4 g/dL — AB (ref 13.0–18.0)
Lymphocytes Relative: 16 %
Lymphs Abs: 1.8 10*3/uL (ref 1.0–3.6)
MCH: 29.2 pg (ref 26.0–34.0)
MCHC: 32.3 g/dL (ref 32.0–36.0)
MCV: 90.2 fL (ref 80.0–100.0)
MONO ABS: 1.3 10*3/uL — AB (ref 0.2–1.0)
Monocytes Relative: 12 %
Neutro Abs: 8 10*3/uL — ABNORMAL HIGH (ref 1.4–6.5)
Neutrophils Relative %: 70 %
Platelets: 261 10*3/uL (ref 150–440)
RBC: 3.55 MIL/uL — AB (ref 4.40–5.90)
RDW: 19.8 % — ABNORMAL HIGH (ref 11.5–14.5)
WBC: 11.3 10*3/uL — ABNORMAL HIGH (ref 3.8–10.6)

## 2015-05-31 LAB — COMPREHENSIVE METABOLIC PANEL
ALT: 15 U/L — ABNORMAL LOW (ref 17–63)
AST: 16 U/L (ref 15–41)
Albumin: 3.3 g/dL — ABNORMAL LOW (ref 3.5–5.0)
Alkaline Phosphatase: 81 U/L (ref 38–126)
Anion gap: 5 (ref 5–15)
BILIRUBIN TOTAL: 0.4 mg/dL (ref 0.3–1.2)
BUN: 24 mg/dL — AB (ref 6–20)
CO2: 21 mmol/L — ABNORMAL LOW (ref 22–32)
Calcium: 8.4 mg/dL — ABNORMAL LOW (ref 8.9–10.3)
Chloride: 105 mmol/L (ref 101–111)
Creatinine, Ser: 0.99 mg/dL (ref 0.61–1.24)
GFR calc Af Amer: >60 mL/min (ref >60)
GLUCOSE: 290 mg/dL — AB (ref 65–99)
Potassium: 4.3 mmol/L (ref 3.5–5.1)
Sodium: 131 mmol/L — ABNORMAL LOW (ref 135–145)
TOTAL PROTEIN: 6.2 g/dL — AB (ref 6.5–8.1)

## 2015-05-31 MED ORDER — SODIUM CHLORIDE 0.9 % IV SOLN
Freq: Once | INTRAVENOUS | Status: AC
  Administered 2015-05-31: 10:00:00 via INTRAVENOUS
  Filled 2015-05-31: qty 1000

## 2015-05-31 MED ORDER — HEPARIN SOD (PORK) LOCK FLUSH 100 UNIT/ML IV SOLN
500.0000 [IU] | Freq: Once | INTRAVENOUS | Status: AC
  Administered 2015-05-31: 500 [IU] via INTRAVENOUS
  Filled 2015-05-31: qty 5

## 2015-06-01 ENCOUNTER — Emergency Department: Payer: PPO

## 2015-06-01 ENCOUNTER — Inpatient Hospital Stay
Admission: EM | Admit: 2015-06-01 | Discharge: 2015-06-02 | DRG: 281 | Disposition: A | Discharge status: Home / Self Care | Payer: PPO | Attending: Internal Medicine | Admitting: Internal Medicine

## 2015-06-01 ENCOUNTER — Other Ambulatory Visit: Payer: Self-pay

## 2015-06-01 ENCOUNTER — Encounter: Payer: Self-pay | Admitting: *Deleted

## 2015-06-01 ENCOUNTER — Encounter: Payer: Self-pay | Admitting: Family Medicine

## 2015-06-01 DIAGNOSIS — I200 Unstable angina: Secondary | ICD-10-CM

## 2015-06-01 DIAGNOSIS — I25110 Atherosclerotic heart disease of native coronary artery with unstable angina pectoris: Principal | ICD-10-CM | POA: Diagnosis present

## 2015-06-01 DIAGNOSIS — I252 Old myocardial infarction: Secondary | ICD-10-CM

## 2015-06-01 DIAGNOSIS — C61 Malignant neoplasm of prostate: Secondary | ICD-10-CM | POA: Diagnosis present

## 2015-06-01 DIAGNOSIS — Z7982 Long term (current) use of aspirin: Secondary | ICD-10-CM

## 2015-06-01 DIAGNOSIS — Z888 Allergy status to other drugs, medicaments and biological substances status: Secondary | ICD-10-CM

## 2015-06-01 DIAGNOSIS — Z886 Allergy status to analgesic agent status: Secondary | ICD-10-CM

## 2015-06-01 DIAGNOSIS — Z9221 Personal history of antineoplastic chemotherapy: Secondary | ICD-10-CM

## 2015-06-01 DIAGNOSIS — I10 Essential (primary) hypertension: Secondary | ICD-10-CM | POA: Diagnosis present

## 2015-06-01 DIAGNOSIS — M1990 Unspecified osteoarthritis, unspecified site: Secondary | ICD-10-CM | POA: Diagnosis present

## 2015-06-01 DIAGNOSIS — Z955 Presence of coronary angioplasty implant and graft: Secondary | ICD-10-CM | POA: Diagnosis not present

## 2015-06-01 DIAGNOSIS — Z8551 Personal history of malignant neoplasm of bladder: Secondary | ICD-10-CM | POA: Diagnosis not present

## 2015-06-01 DIAGNOSIS — Z923 Personal history of irradiation: Secondary | ICD-10-CM

## 2015-06-01 DIAGNOSIS — Z79899 Other long term (current) drug therapy: Secondary | ICD-10-CM | POA: Diagnosis not present

## 2015-06-01 DIAGNOSIS — E782 Mixed hyperlipidemia: Secondary | ICD-10-CM | POA: Diagnosis present

## 2015-06-01 DIAGNOSIS — I214 Non-ST elevation (NSTEMI) myocardial infarction: Secondary | ICD-10-CM | POA: Diagnosis present

## 2015-06-01 DIAGNOSIS — R079 Chest pain, unspecified: Secondary | ICD-10-CM

## 2015-06-01 DIAGNOSIS — Z79891 Long term (current) use of opiate analgesic: Secondary | ICD-10-CM

## 2015-06-01 DIAGNOSIS — E119 Type 2 diabetes mellitus without complications: Secondary | ICD-10-CM | POA: Diagnosis present

## 2015-06-01 DIAGNOSIS — G4730 Sleep apnea, unspecified: Secondary | ICD-10-CM | POA: Diagnosis present

## 2015-06-01 DIAGNOSIS — Z7902 Long term (current) use of antithrombotics/antiplatelets: Secondary | ICD-10-CM

## 2015-06-01 LAB — CBC WITH DIFFERENTIAL/PLATELET
Band Neutrophils: 4 % (ref 0–10)
Basophils Absolute: 0 10*3/uL (ref 0.0–0.1)
Basophils Relative: 0 % (ref 0–1)
Blasts: 0 %
Eosinophils Absolute: 0 10*3/uL (ref 0.0–0.7)
Eosinophils Relative: 0 % (ref 0–5)
HCT: 30 % — ABNORMAL LOW (ref 40.0–52.0)
HEMOGLOBIN: 10 g/dL — AB (ref 13.0–18.0)
Lymphocytes Relative: 21 % (ref 12–46)
Lymphs Abs: 2.5 10*3/uL (ref 0.7–4.0)
MCH: 30.2 pg (ref 26.0–34.0)
MCHC: 33.3 g/dL (ref 32.0–36.0)
MCV: 90.5 fL (ref 80.0–100.0)
METAMYELOCYTES PCT: 1 %
Monocytes Absolute: 0.6 10*3/uL (ref 0.1–1.0)
Monocytes Relative: 5 % (ref 3–12)
Myelocytes: 2 %
NEUTROS ABS: 8.7 10*3/uL — AB (ref 1.7–7.7)
NRBC: 0 /100{WBCs}
Neutrophils Relative %: 67 % (ref 43–77)
Other: 0 %
PLATELETS: 249 10*3/uL (ref 150–440)
PROMYELOCYTES ABS: 0 %
RBC: 3.31 MIL/uL — ABNORMAL LOW (ref 4.40–5.90)
RDW: 20.5 % — ABNORMAL HIGH (ref 11.5–14.5)
WBC: 11.8 10*3/uL — ABNORMAL HIGH (ref 3.8–10.6)

## 2015-06-01 LAB — GLUCOSE, CAPILLARY
GLUCOSE-CAPILLARY: 258 mg/dL — AB (ref 65–99)
Glucose-Capillary: 254 mg/dL — ABNORMAL HIGH (ref 65–99)
Glucose-Capillary: 177 mg/dL — ABNORMAL HIGH (ref 65–99)
Glucose-Capillary: 208 mg/dL — ABNORMAL HIGH (ref 65–99)
Glucose-Capillary: 225 mg/dL — ABNORMAL HIGH (ref 65–99)

## 2015-06-01 LAB — COMPREHENSIVE METABOLIC PANEL
ALBUMIN: 3.1 g/dL — AB (ref 3.5–5.0)
ALK PHOS: 82 U/L (ref 38–126)
ALT: 14 U/L — AB (ref 17–63)
AST: 18 U/L (ref 15–41)
Anion gap: 9 (ref 5–15)
BUN: 22 mg/dL — AB (ref 6–20)
CO2: 20 mmol/L — ABNORMAL LOW (ref 22–32)
Calcium: 8.7 mg/dL — ABNORMAL LOW (ref 8.9–10.3)
Chloride: 105 mmol/L (ref 101–111)
Creatinine, Ser: 0.93 mg/dL (ref 0.61–1.24)
GFR calc Af Amer: >60 mL/min (ref >60)
Glucose, Bld: 316 mg/dL — ABNORMAL HIGH (ref 65–99)
Potassium: 4.2 mmol/L (ref 3.5–5.1)
SODIUM: 134 mmol/L — AB (ref 135–145)
Total Bilirubin: 0.3 mg/dL (ref 0.3–1.2)
Total Protein: 5.8 g/dL — ABNORMAL LOW (ref 6.5–8.1)

## 2015-06-01 LAB — TROPONIN I
TROPONIN I: 0.18 ng/mL — AB (ref <0.031)
TROPONIN I: 0.25 ng/mL — AB (ref <0.031)
Troponin I: 0.3 ng/mL — ABNORMAL HIGH (ref <0.031)

## 2015-06-01 LAB — HEPARIN LEVEL (UNFRACTIONATED): HEPARIN UNFRACTIONATED: 1.21 [IU]/mL — AB (ref 0.30–0.70)

## 2015-06-01 LAB — PROTIME-INR
INR: 1.12
Prothrombin Time: 14.6 seconds (ref 11.4–15.0)

## 2015-06-01 LAB — APTT: aPTT: 24 seconds (ref 24–36)

## 2015-06-01 MED ORDER — ONDANSETRON HCL 4 MG/2ML IJ SOLN: 4.0000 mg | Freq: Four times a day (QID) | INTRAMUSCULAR | Status: DC | PRN

## 2015-06-01 MED ORDER — ATENOLOL 25 MG PO TABS
25.0000 mg | ORAL_TABLET | Freq: Every morning | ORAL | Status: DC
  Administered 2015-06-01 – 2015-06-02 (×2): 25 mg via ORAL
  Filled 2015-06-01 (×2): qty 1

## 2015-06-01 MED ORDER — MEGESTROL ACETATE 625 MG/5ML PO SUSP
625.0000 mg | Freq: Every day | ORAL | Status: DC
  Filled 2015-06-01 (×6): qty 5

## 2015-06-01 MED ORDER — NITROGLYCERIN 2 % TD OINT
1.0000 [in_us] | TOPICAL_OINTMENT | Freq: Once | TRANSDERMAL | Status: AC
  Administered 2015-06-01: 1 [in_us] via TOPICAL
  Filled 2015-06-01: qty 1

## 2015-06-01 MED ORDER — HEPARIN BOLUS VIA INFUSION
2000.0000 [IU] | Freq: Once | INTRAVENOUS | Status: AC
  Administered 2015-06-01: 2000 [IU] via INTRAVENOUS
  Filled 2015-06-01: qty 2000

## 2015-06-01 MED ORDER — MEGESTROL ACETATE 400 MG/10ML PO SUSP
800.0000 mg | Freq: Every day | ORAL | Status: DC
  Administered 2015-06-01 – 2015-06-02 (×2): 800 mg via ORAL
  Filled 2015-06-01 (×2): qty 20

## 2015-06-01 MED ORDER — ENOXAPARIN SODIUM 100 MG/ML ~~LOC~~ SOLN: 90.0000 mg | Freq: Once | SUBCUTANEOUS | Status: DC

## 2015-06-01 MED ORDER — MORPHINE SULFATE 2 MG/ML IJ SOLN: 2.0000 mg | INTRAMUSCULAR | Status: DC | PRN

## 2015-06-01 MED ORDER — HEPARIN SODIUM (PORCINE) 5000 UNIT/ML IJ SOLN
5000.0000 [IU] | Freq: Three times a day (TID) | INTRAMUSCULAR | Status: DC
  Administered 2015-06-01: 5000 [IU] via SUBCUTANEOUS
  Filled 2015-06-01: qty 1

## 2015-06-01 MED ORDER — MEGESTROL ACETATE 400 MG/10ML PO SUSP
625.0000 mg | Freq: Every day | ORAL | Status: DC
  Filled 2015-06-01: qty 20

## 2015-06-01 MED ORDER — SODIUM CHLORIDE 0.9 % WEIGHT BASED INFUSION: 1.0000 mL/kg/h | INTRAVENOUS | Status: DC

## 2015-06-01 MED ORDER — ACETAMINOPHEN 325 MG PO TABS: 650.0000 mg | ORAL_TABLET | Freq: Four times a day (QID) | ORAL | Status: DC | PRN

## 2015-06-01 MED ORDER — HEPARIN (PORCINE) IN NACL 100-0.45 UNIT/ML-% IJ SOLN
1350.0000 [IU]/h | INTRAMUSCULAR | Status: DC
  Administered 2015-06-01: 1100 [IU]/h via INTRAVENOUS
  Administered 2015-06-02: 1350 [IU]/h via INTRAVENOUS
  Filled 2015-06-01 (×4): qty 250

## 2015-06-01 MED ORDER — SODIUM CHLORIDE 0.9 % IJ SOLN
3.0000 mL | Freq: Two times a day (BID) | INTRAMUSCULAR | Status: DC
  Administered 2015-06-01: 3 mL via INTRAVENOUS

## 2015-06-01 MED ORDER — INSULIN ASPART 100 UNIT/ML ~~LOC~~ SOLN
0.0000 [IU] | Freq: Every day | SUBCUTANEOUS | Status: DC
  Administered 2015-06-01: 3 [IU] via SUBCUTANEOUS
  Filled 2015-06-01 (×2): qty 3

## 2015-06-01 MED ORDER — ADULT MULTIVITAMIN W/MINERALS CH
1.0000 | ORAL_TABLET | Freq: Every day | ORAL | Status: DC
  Administered 2015-06-01 – 2015-06-02 (×2): 1 via ORAL
  Filled 2015-06-01 (×2): qty 1

## 2015-06-01 MED ORDER — CLOPIDOGREL BISULFATE 75 MG PO TABS
75.0000 mg | ORAL_TABLET | Freq: Every day | ORAL | Status: DC
  Administered 2015-06-01 – 2015-06-02 (×2): 75 mg via ORAL
  Filled 2015-06-01 (×2): qty 1

## 2015-06-01 MED ORDER — COENZYME Q10 10 MG PO CAPS: 10.0000 mg | ORAL_CAPSULE | Freq: Every day | ORAL | Status: DC

## 2015-06-01 MED ORDER — FENTANYL 50 MCG/HR TD PT72: 50.0000 ug | MEDICATED_PATCH | TRANSDERMAL | Status: DC

## 2015-06-01 MED ORDER — NITROGLYCERIN 0.4 MG SL SUBL
0.4000 mg | SUBLINGUAL_TABLET | SUBLINGUAL | Status: DC | PRN
  Administered 2015-06-01: 0.4 mg via SUBLINGUAL
  Filled 2015-06-01: qty 1

## 2015-06-01 MED ORDER — SODIUM CHLORIDE 0.9 % IJ SOLN: 3.0000 mL | INTRAMUSCULAR | Status: DC | PRN

## 2015-06-01 MED ORDER — ATORVASTATIN CALCIUM 20 MG PO TABS
40.0000 mg | ORAL_TABLET | Freq: Every evening | ORAL | Status: DC
  Administered 2015-06-01: 40 mg via ORAL
  Filled 2015-06-01: qty 2

## 2015-06-01 MED ORDER — SODIUM CHLORIDE 0.9 % IV SOLN: 250.0000 mL | INTRAVENOUS | Status: DC | PRN

## 2015-06-01 MED ORDER — ASPIRIN EC 81 MG PO TBEC
81.0000 mg | DELAYED_RELEASE_TABLET | Freq: Every day | ORAL | Status: DC
  Administered 2015-06-01 – 2015-06-02 (×2): 81 mg via ORAL
  Filled 2015-06-01 (×2): qty 1

## 2015-06-01 MED ORDER — MEGESTROL ACETATE 625 MG/5ML PO SUSP: 625.0000 mg | Freq: Every day | ORAL | Status: DC

## 2015-06-01 MED ORDER — DOCUSATE SODIUM 100 MG PO CAPS
100.0000 mg | ORAL_CAPSULE | Freq: Two times a day (BID) | ORAL | Status: DC
  Filled 2015-06-01 (×3): qty 1

## 2015-06-01 MED ORDER — ONDANSETRON HCL 4 MG PO TABS: 4.0000 mg | ORAL_TABLET | Freq: Four times a day (QID) | ORAL | Status: DC | PRN

## 2015-06-01 MED ORDER — INSULIN ASPART 100 UNIT/ML ~~LOC~~ SOLN
0.0000 [IU] | Freq: Three times a day (TID) | SUBCUTANEOUS | Status: DC
  Administered 2015-06-01: 2 [IU] via SUBCUTANEOUS
  Administered 2015-06-01 (×2): 3 [IU] via SUBCUTANEOUS
  Administered 2015-06-02: 1 [IU] via SUBCUTANEOUS
  Filled 2015-06-01: qty 1
  Filled 2015-06-01: qty 3
  Filled 2015-06-01: qty 2
  Filled 2015-06-01: qty 3

## 2015-06-01 MED ORDER — ACETAMINOPHEN 650 MG RE SUPP: 650.0000 mg | Freq: Four times a day (QID) | RECTAL | Status: DC | PRN

## 2015-06-01 MED ORDER — SODIUM CHLORIDE 0.9 % WEIGHT BASED INFUSION: 3.0000 mL/kg/h | INTRAVENOUS | Status: AC

## 2015-06-01 MED ORDER — SODIUM CHLORIDE 0.9 % IJ SOLN
3.0000 mL | Freq: Two times a day (BID) | INTRAMUSCULAR | Status: DC
  Administered 2015-06-01 (×3): 3 mL via INTRAVENOUS

## 2015-06-01 NOTE — Progress Notes (Signed)
PHARMACIST - PHYSICIAN ORDER COMMUNICATION  CONCERNING: P&T Medication Policy on Herbal Medications  DESCRIPTION:  This patient's order for:  Coenzyme Q10 has been noted.  This product(s) is classified as an "herbal" or natural product. Due to a lack of definitive safety studies or FDA approval, nonstandard manufacturing practices, plus the potential risk of unknown drug-drug interactions while on inpatient medications, the Pharmacy and Therapeutics Committee does not permit the use of "herbal" or natural products of this type within [facility].   ACTION TAKEN: The pharmacy department is unable to verify this order at this time and your patient has been informed of this safety policy. Please reevaluate patient's clinical condition at discharge and address if the herbal or natural product(s) should be resumed at that time.  Murrell Converse, PharmD Clinical Pharmacist 06/01/2015

## 2015-06-01 NOTE — H&P (Signed)
[facility] [place] NAME: Louis Alexander    MR#:  [id number]  DATE OF BIRTH:  1941-07-12   DATE OF ADMISSION:  06/01/2015  PRIMARY CARE PHYSICIAN: Lelon Huh, MD   REQUESTING/REFERRING PHYSICIAN: Beather Arbour  CHIEF COMPLAINT:   Chief Complaint  Patient presents with  . Chest Pain    HISTORY OF PRESENT ILLNESS:  Louis Alexander  is a 74 y.o. male with a known history of coronary artery disease status post PCI and stent placement 3 as well as metastatic prostate cancer presenting with chest pain. He describes acute onset chest pain retrosternal in location radiation to left chest which occurred at rest, pressure in quality, 8/10 intensity, no worsening/relieving factors no associated symptoms currently rates chest pain 2/10 intensity. Of note active chemotherapy last treatment approximately 2 weeks ago  PAST MEDICAL HISTORY:   Past Medical History  Diagnosis Date  . Abnormal EKG   . Coronary artery disease   . Diabetes mellitus without complication   . Obesity   . Hyperlipidemia   . Hypertension   . 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 Sleep apnea     could not tolerate cpap mask  . Arthritis     oa  . Cancer     bladder and prostate  . History of radiation therapy 2012  . History of chemotherapy 2014  . Bladder cancer     PAST SURGICAL HISTORY:   Past Surgical History  Procedure Laterality Date  . Cardiac catheterization  12-06-2003  . Lad stent  2000 x1 stent, 2008 x 1 stent, 2012 x 1 stent  . Ptca  2001  . Hernia repair  yrs ago  . Appendectomy  many yrs ago  . Rotator cuff repair Left yrs ago  . Seed implant to prostate with radiation  2012  . Protatectomy and urostomy  2014  . Total hip arthroplasty Right 12/27/2013    Procedure: RIGHT TOTAL HIP ARTHROPLASTY ANTERIOR APPROACH;  Surgeon: Mauri Pole, MD;  Location: [facility];  Service: Orthopedics;  Laterality: Right;    SOCIAL HISTORY:    History  Substance Use Topics  . Smoking status: Never Smoker   . Smokeless tobacco: Never Used  . Alcohol Use: Yes     Comment: occasional    FAMILY HISTORY:   Family History  Problem Relation Age of Onset  . Stroke Father     DRUG ALLERGIES:   Allergies  Allergen Reactions  . Celebrex [Celecoxib]     Other reaction(s): Bleeding Bleeding in bladder  . Effient [Prasugrel] Other (See Comments)    Other reaction(s): Bleeding Bleeding in bladder    REVIEW OF SYSTEMS:  REVIEW OF SYSTEMS:  CONSTITUTIONAL: Denies fevers, chills, positive fatigue, weakness.  EYES: Denies blurred vision, double vision, or eye pain.  EARS, NOSE, THROAT: Denies tinnitus, ear pain, hearing loss.  RESPIRATORY: denies cough, shortness of breath, wheezing  CARDIOVASCULAR: Positive chest pain, denies palpitations, positive edema.  GASTROINTESTINAL: Denies nausea, vomiting, diarrhea, abdominal pain.  GENITOURINARY: Denies dysuria, hematuria.  ENDOCRINE: Denies nocturia or thyroid problems. HEMATOLOGIC AND LYMPHATIC: Denies easy bruising or bleeding.  SKIN: Denies rash or lesions.  MUSCULOSKELETAL: Denies pain in neck, back, shoulder, knees, hips, or further arthritic symptoms.  NEUROLOGIC: Denies paralysis, paresthesias.  PSYCHIATRIC: Denies anxiety or depressive symptoms. Otherwise full review of systems performed by me is negative.   MEDICATIONS AT HOME:   Prior to Admission medications  Medication Sig Start Date End Date Taking? Authorizing Provider  aspirin EC 81 MG tablet Take 81 mg by mouth daily.    Historical Provider, MD  atenolol (TENORMIN) 25 MG tablet Take 25 mg by mouth every morning.    Historical Provider, MD  atorvastatin (LIPITOR) 40 MG tablet Take 40 mg by mouth every evening.    Historical Provider, MD  clopidogrel (PLAVIX) 75 MG tablet Take 75 mg by mouth daily with breakfast.    Historical Provider, MD  Coenzyme Q10 10 MG capsule Take 10 mg by mouth.    Historical  Provider, MD  docusate sodium 100 MG CAPS Take 100 mg by mouth 2 (two) times daily. 12/28/13   Danae Orleans, PA-C  fentaNYL (DURAGESIC - DOSED MCG/HR) 50 MCG/HR Place 50 mcg onto the skin every 3 (three) days.    Historical Provider, MD  glipiZIDE (GLUCOTROL) 10 MG tablet Take 10 mg by mouth daily before breakfast.    Historical Provider, MD  lidocaine-prilocaine (EMLA) cream Apply 1 application topically once.    Historical Provider, MD  megestrol (MEGACE ES) 625 MG/5ML suspension TAKE 5 MLS (625 MG TOTAL) BY MOUTH DAILY. 05/17/15   Lloyd Huger, MD  megestrol (MEGACE ES) 625 MG/5ML suspension TAKE 5 MLS (625 MG TOTAL) BY MOUTH DAILY. 05/17/15   Lloyd Huger, MD  metFORMIN (GLUCOPHAGE) 500 MG tablet Take 500 mg by mouth 2 (two) times daily with a meal.    Historical Provider, MD  Multiple Vitamin (MULTIVITAMIN WITH MINERALS) TABS tablet Take 1 tablet by mouth daily.    Historical Provider, MD      VITAL SIGNS:  Blood pressure 142/88, pulse 95, temperature 98.4 F (36.9 C), temperature source Oral, resp. rate 25, height 5\' 11"  (1.803 m), weight 190 lb (86.183 kg), SpO2 97 %.  PHYSICAL EXAMINATION:  VITAL SIGNS: Filed Vitals:   06/01/15 0201  BP: 142/88  Pulse: 95  Temp:   Resp: 25   GENERAL:73 y.o.male currently in no acute distress.  HEAD: Normocephalic, atraumatic.  EYES: Pupils equal, round, reactive to light. Extraocular muscles intact. No scleral icterus.  MOUTH: Moist mucosal membrane. Dentition intact. No abscess noted.  EAR, NOSE, THROAT: Clear without exudates. No external lesions.  NECK: Supple. No thyromegaly. No nodules. No JVD.  PULMONARY: Clear to ascultation, without wheeze rails or rhonci. No use of accessory muscles, Good respiratory effort. good air entry bilaterally CHEST: Nontender to palpation.  CARDIOVASCULAR: S1 and S2. Regular rate and rhythm. No murmurs, rubs, or gallops. 2+ edema. Pedal pulses 2+ bilaterally.  GASTROINTESTINAL: Soft, nontender,  nondistended. No masses. Positive bowel sounds. No hepatosplenomegaly.  MUSCULOSKELETAL: No swelling, clubbing, or edema. Range of motion full in all extremities.  NEUROLOGIC: Cranial nerves II through XII are intact. No gross focal neurological deficits. Sensation intact. Reflexes intact.  SKIN: No ulceration, lesions, rashes, or cyanosis. Skin warm and dry. Turgor intact.  PSYCHIATRIC: Mood, affect within normal limits. The patient is awake, alert and oriented x 3. Insight, judgment intact.    LABORATORY PANEL:   CBC  Recent Labs Lab 06/01/15 0141  WBC 11.8*  HGB 10.0*  HCT 30.0*  PLT 249   ------------------------------------------------------------------------------------------------------------------  Chemistries   Recent Labs Lab 06/01/15 0141  NA 134*  K 4.2  CL 105  CO2 20*  GLUCOSE 316*  BUN 22*  CREATININE 0.93  CALCIUM 8.7*  AST 18  ALT 14*  ALKPHOS 82  BILITOT 0.3   ------------------------------------------------------------------------------------------------------------------  Cardiac Enzymes  Recent Labs Lab 06/01/15 0141  TROPONINI <0.03   ------------------------------------------------------------------------------------------------------------------  RADIOLOGY:  Dg Chest Port 1 View  06/01/2015   CLINICAL DATA:  Left-sided chest pain starting about 2 hours ago. Shortness of breath and tingling in the left arm and hand.  EXAM: PORTABLE CHEST - 1 VIEW  COMPARISON:  12/21/2013  FINDINGS: Right power port type central venous catheter with tip over the lower SVC. No pneumothorax. Metallic foreign body again demonstrated over the left chest. No change since prior study. The heart size and mediastinal contours are within normal limits. Both lungs are clear. The visualized skeletal structures are unremarkable.  IMPRESSION: No active disease.   Electronically Signed   By: Lucienne Capers M.D.   On: 06/01/2015 02:01    EKG:   Orders placed or  performed during the hospital encounter of 06/01/15  . ED EKG  . ED EKG    IMPRESSION AND PLAN:   74 year old Caucasian gentleman history coronary artery disease status post PCI stent placement 3 presenting with chest pain.  1. Chest pain, central: Initiate aspirin/statin therapy, place on telemetry, trend cardiac enzymes, consult cardiology follows with Dr. Humphrey Rolls 2. Type 2 diabetes non-insulin-requiring: Hold oral agents at insulin sliding scale with Accu-Cheks 3. Essential hypertension: Atenolol 4. Hyperlipidemia unspecified: Lipitor 5. Venous thromboembolism prophylactic: Heparin subcutaneous    All the records are reviewed and case discussed with ED provider. Management plans discussed with the patient, family and they are in agreement.  CODE STATUS: Full  TOTAL TIME TAKING CARE OF THIS PATIENT: 35 minutes.    Ramere Downs,  Karenann Cai.D on 06/01/2015 at 2:51 AM  Between 7am to 6pm - Pager - [phone]  After 6pm: House Pager: - [facility]  Office  [phone]  CC: Primary care physician; Lelon Huh, MD

## 2015-06-01 NOTE — Progress Notes (Signed)
Notified MD, Marcille Blanco of positive troponin of 0.18, Dr. Aletha Halim put in orders to start pt on heparin gtt.

## 2015-06-01 NOTE — Progress Notes (Signed)
NSR. Suprapubic cath. Heparin drip at 11. Pt has not reported any pain. A & O. FS are stable. 2 L . Family at the bedside. NPO for cardiac cath. Portacath to R chest. Pt has no further concerns at this time.

## 2015-06-01 NOTE — Progress Notes (Signed)
Came with chest pain- previous Hx of 2 stents. Recent Cath 2 mth ago- negative.  On chemotherapy.  Troponin borderline.  Plan: Dr. Nehemiah Massed saw him. Cardiac cath- when possible. Meanwhile heparin drip. Cardiac meds.

## 2015-06-01 NOTE — Progress Notes (Signed)
Inpatient Diabetes Program Recommendations  AACE/ADA: New Consensus Statement on Inpatient Glycemic Control (2013)  Target Ranges:  Prepandial:   less than 140 mg/dL      Peak postprandial:   less than 180 mg/dL (1-2 hours)      Critically ill patients:  140 - 180 mg/dL   Results for Louis Alexander, Louis Alexander (MRN [id number]) as of 06/01/2015 12:28  Ref. Range 05/28/2015 09:46 05/31/2015 08:53 06/01/2015 01:41  Glucose Latest Ref Range: 65-99 mg/dL 335 (H) 290 (H) 316 (H)   Reason for assessment: elevated CBG  Diabetes history: Type 2 Outpatient Diabetes medications: Glucotrol 10mg /day, Glucophage 500mg  bid Current orders for Inpatient glycemic control: Novolog correction 0-9 units tid with meals, Novolog 0-5 units qhs  Please consider ordering an A1C to determine blood sugar control over the past few months.     Consider starting low dose basal insulin 10 units Lantus qhs(~0.1units /kg); fasting blood sugar 177mg /dl  Consider increasing correction insulin to moderate correction scale, Novolog 0-15 units tid with meals; post prandial blood sugars are elevated despite current dose of Novolog given)   Gentry Fitz, RN, BA, [place], CDE Diabetes Coordinator Inpatient Diabetes Program  [phone] (Team Pager) [phone] ([place]) 06/01/2015 12:38 PM

## 2015-06-01 NOTE — Progress Notes (Signed)
ANTICOAGULATION CONSULT NOTE - Initial Consult  Pharmacy Consult for Heparin Drip Indication: chest pain/ACS  Allergies  Allergen Reactions  . Celebrex [Celecoxib]     Other reaction(s): Bleeding Bleeding in bladder  . Effient [Prasugrel] Other (See Comments)    Other reaction(s): Bleeding Bleeding in bladder    Patient Measurements: Height: 5\' 11"  (180.3 cm) Weight: 191 lb 4.8 oz (86.773 kg) IBW/kg (Calculated) : 75.3 Heparin Dosing Weight: 86.8 kg  Vital Signs: Temp: 97.5 F (36.4 C) (07/15 0411) Temp Source: Oral (07/15 0411) BP: 142/81 mmHg (07/15 0411) Pulse Rate: 68 (07/15 0411)  Labs:  Recent Labs  05/31/15 0853 06/01/15 0141 06/01/15 0445  HGB 10.4* 10.0*  --   HCT 32.0* 30.0*  --   PLT 261 249  --   LABPROT  --  14.6  --   INR  --  1.12  --   CREATININE 0.99 0.93  --   TROPONINI  --  <0.03 0.18*    Estimated Creatinine Clearance: 76.5 mL/min (by C-G formula based on Cr of 0.93).   Medical History: Past Medical History  Diagnosis Date  . Abnormal EKG   . Coronary artery disease   . Diabetes mellitus without complication   . Obesity   . Hyperlipidemia   . Hypertension   . 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 Sleep apnea     could not tolerate cpap mask  . Arthritis     oa  . Cancer     bladder and prostate  . History of radiation therapy 2012  . History of chemotherapy 2014  . Bladder cancer     Medications:  Scheduled:  . aspirin EC  81 mg Oral Daily  . atenolol  25 mg Oral q morning - 10a  . atorvastatin  40 mg Oral QPM  . clopidogrel  75 mg Oral Q breakfast  . docusate sodium  100 mg Oral BID  . fentaNYL  50 mcg Transdermal Q72H  . heparin  2,000 Units Intravenous Once  . insulin aspart  0-5 Units Subcutaneous QHS  . insulin aspart  0-9 Units Subcutaneous TID WC  . megestrol  800 mg Oral Daily  . multivitamin with minerals  1 tablet Oral Daily  . sodium chloride  3 mL Intravenous Q12H   Infusions:  . heparin       Assessment: Pharmacy consulted to dose Heparin drip for patient with NSTEMI.  APTT pending, INR: 1.12, Hgb: 10, Plts: 249  Goal of Therapy:  Heparin level 0.3-0.7 units/ml Monitor platelets by anticoagulation protocol: Yes   Plan:  Patient received Heparin 5000 units subq at 0600.  Therefore, will administer half bolus.  Give 2000 units bolus x 1 Start heparin infusion at 1100 units/hr Check anti-Xa level in 8 hours and daily while on heparin Continue to monitor H&H and platelets.    Murrell Converse, PharmD Clinical Pharmacist 06/01/2015

## 2015-06-01 NOTE — Progress Notes (Signed)
Alerted to positive troponin of 0.18. Patient sleeping and does not appear short of breath. Telemetry shows sinus rhythm with first-degree heart block. Patient has history of coronary artery disease status post MI 2. Pharmacy consult ordered for heparin drip

## 2015-06-01 NOTE — Consult Note (Signed)
[facility] Consultation Note  Patient ID: Louis Alexander, MRN: [id number], DOB/AGE: 1941-01-25 74 y.o. Admit date: 06/01/2015   Date of Consult: 06/01/2015 Primary Physician: Lelon Huh, MD Primary Cardiologist: Humphrey Rolls  Chief Complaint:  Chief Complaint  Patient presents with  . Chest Pain   Reason for Consult: acute myocardial infarction  HPI: 74 y.o. male with known coronary artery disease status post previous PCI and stent placement 3 times in the past with known diabetes hypertension and hyperlipidemia who is had new onset of chest discomfort substernal in nature lasting approximately 2-3 hours after pushing his garbage can up the driveway. This continued to be a problem associated with shortness of breath over the many. Time and was seen in the emergency room with an EKG showing normal sinus rhythm with inferolateral ST depression and T-wave inversions. He also had an elevation of troponin of now speaking to 0.30 consistent with non-ST elevation myocardial infarction. He is more stable With No Further Evidence of Chest Discomfort. The Patient Has Known Diabetes with Complications and Hyperlipidemia and Hypertension on Appropriate Medication Management and Stable. Patient Does Have Prostate Cancer in the Midst of Having Multiple Chemotherapy  Past Medical History  Diagnosis Date  . Abnormal EKG   . Coronary artery disease   . Diabetes mellitus without complication   . Obesity   . Hyperlipidemia   . Hypertension   . 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 Sleep apnea     could not tolerate cpap mask  . Arthritis     oa  . Cancer     bladder and prostate  . History of radiation therapy 2012  . History of chemotherapy 2014  . Bladder cancer       Surgical History:  Past Surgical History  Procedure Laterality Date  . Cardiac catheterization  12-06-2003  . Lad stent  2000 x1 stent, 2008 x 1 stent, 2012 x 1 stent  . Ptca  2001  . Hernia repair  yrs  ago  . Appendectomy  many yrs ago  . Rotator cuff repair Left yrs ago  . Seed implant to prostate with radiation  2012  . Protatectomy and urostomy  2014  . Total hip arthroplasty Right 12/27/2013    Procedure: RIGHT TOTAL HIP ARTHROPLASTY ANTERIOR APPROACH;  Surgeon: Mauri Pole, MD;  Location: [facility];  Service: Orthopedics;  Laterality: Right;     Home Meds: Prior to Admission medications   Medication Sig Start Date End Date Taking? Authorizing Provider  aspirin EC 81 MG tablet Take 81 mg by mouth daily.    Historical Provider, MD  atenolol (TENORMIN) 25 MG tablet Take 25 mg by mouth every morning.    Historical Provider, MD  atorvastatin (LIPITOR) 40 MG tablet Take 40 mg by mouth every evening.    Historical Provider, MD  clopidogrel (PLAVIX) 75 MG tablet Take 75 mg by mouth daily with breakfast.    Historical Provider, MD  Coenzyme Q10 10 MG capsule Take 10 mg by mouth.    Historical Provider, MD  docusate sodium 100 MG CAPS Take 100 mg by mouth 2 (two) times daily. Patient not taking: Reported on 06/01/2015 12/28/13   Danae Orleans, PA-C  fentaNYL (DURAGESIC - DOSED MCG/HR) 50 MCG/HR Place 50 mcg onto the skin every 3 (three) days.    Historical Provider, MD  glipiZIDE (GLUCOTROL) 10 MG tablet Take 10 mg by mouth daily before breakfast.    Historical Provider, MD  lidocaine-prilocaine (EMLA) cream Apply 1 application topically once.    Historical Provider, MD  megestrol (MEGACE ES) 625 MG/5ML suspension TAKE 5 MLS (625 MG TOTAL) BY MOUTH DAILY. 05/17/15   Lloyd Huger, MD  megestrol (MEGACE ES) 625 MG/5ML suspension TAKE 5 MLS (625 MG TOTAL) BY MOUTH DAILY. 05/17/15   Lloyd Huger, MD  metFORMIN (GLUCOPHAGE) 500 MG tablet Take 500 mg by mouth 2 (two) times daily with a meal.    Historical Provider, MD  Multiple Vitamin (MULTIVITAMIN WITH MINERALS) TABS tablet Take 1 tablet by mouth daily.    Historical Provider, MD    Inpatient Medications:  . aspirin EC  81 mg Oral Daily   . atenolol  25 mg Oral q morning - 10a  . atorvastatin  40 mg Oral QPM  . clopidogrel  75 mg Oral Q breakfast  . docusate sodium  100 mg Oral BID  . fentaNYL  50 mcg Transdermal Q72H  . insulin aspart  0-5 Units Subcutaneous QHS  . insulin aspart  0-9 Units Subcutaneous TID WC  . megestrol  800 mg Oral Daily  . multivitamin with minerals  1 tablet Oral Daily  . sodium chloride  3 mL Intravenous Q12H  . sodium chloride  3 mL Intravenous Q12H   . [START ON 06/02/2015] sodium chloride     Followed by  . [START ON 06/02/2015] sodium chloride    . heparin 1,100 Units/hr (06/01/15 9628)    Allergies:  Allergies  Allergen Reactions  . Celebrex [Celecoxib]     Other reaction(s): Bleeding Bleeding in bladder  . Effient [Prasugrel] Other (See Comments)    Other reaction(s): Bleeding Bleeding in bladder    History   Social History  . Marital Status: Married    Spouse Name: N/A  . Number of Children: N/A  . Years of Education: N/A   Occupational History  . Not on file.   Social History Main Topics  . Smoking status: Never Smoker   . Smokeless tobacco: Never Used  . Alcohol Use: No     Comment: occasional  . Drug Use: No  . Sexual Activity: Not on file   Other Topics Concern  . Not on file   Social History Narrative     Family History  Problem Relation Age of Onset  . Stroke Father      Review of Systems Positive for chest pain and shortness of breath Negative for: General:  chills, fever, night sweats or weight changes.  Cardiovascular: PND orthopnea syncope dizziness  Dermatological skin lesions rashes Respiratory: Cough congestion Urologic: Frequent urination urination at night and hematuria Abdominal: negative for nausea, vomiting, diarrhea, bright red blood per rectum, melena, or hematemesis Neurologic: negative for visual changes, and/or hearing changes  All other systems reviewed and are otherwise negative except as noted above.  Labs:  Recent Labs   06/01/15 0141 06/01/15 0445 06/01/15 1052  TROPONINI <0.03 0.18* 0.30*   Lab Results  Component Value Date   WBC 11.8* 06/01/2015   HGB 10.0* 06/01/2015   HCT 30.0* 06/01/2015   MCV 90.5 06/01/2015   PLT 249 06/01/2015    Recent Labs Lab 06/01/15 0141  NA 134*  K 4.2  CL 105  CO2 20*  BUN 22*  CREATININE 0.93  CALCIUM 8.7*  PROT 5.8*  BILITOT 0.3  ALKPHOS 82  ALT 14*  AST 18  GLUCOSE 316*   No results found for: CHOL, HDL, LDLCALC, TRIG No results found for: DDIMER  Radiology/Studies:  Dg Chest Port 1 View  06/01/2015   CLINICAL DATA:  Left-sided chest pain starting about 2 hours ago. Shortness of breath and tingling in the left arm and hand.  EXAM: PORTABLE CHEST - 1 VIEW  COMPARISON:  12/21/2013  FINDINGS: Right power port type central venous catheter with tip over the lower SVC. No pneumothorax. Metallic foreign body again demonstrated over the left chest. No change since prior study. The heart size and mediastinal contours are within normal limits. Both lungs are clear. The visualized skeletal structures are unremarkable.  IMPRESSION: No active disease.   Electronically Signed   By: Lucienne Capers M.D.   On: 06/01/2015 02:01    EKG: Normal sinus rhythm with inferior lateral ST depression with T-wave inversion  Weights: Filed Weights   06/01/15 0113 06/01/15 0411  Weight: 190 lb (86.183 kg) 191 lb 4.8 oz (86.773 kg)     Physical Exam: Blood pressure 140/78, pulse 72, temperature 98 F (36.7 C), temperature source Oral, resp. rate 16, height 5\' 11"  (1.803 m), weight 191 lb 4.8 oz (86.773 kg), SpO2 100 %. Body mass index is 26.69 kg/(m^2). General: Well developed, well nourished, in no acute distress. Head eyes ears nose throat: Normocephalic, atraumatic, sclera non-icteric, no xanthomas, nares are without discharge. No apparent thyromegaly and/or mass  Lungs: Normal respiratory effort.  no wheezes, no rales, no rhonchi.  Heart: RRR with normal S1 S2. no  murmur gallop, no rub, PMI is normal size and placement, carotid upstroke normal without bruit, jugular venous pressure is normal Abdomen: Soft, non-tender, non-distended with normoactive bowel sounds. No hepatomegaly. No rebound/guarding. No obvious abdominal masses. Abdominal aorta is normal size without bruit Extremities: No edema. no cyanosis, no clubbing, no ulcers  Peripheral : 2+ bilateral upper extremity pulses, 2+ bilateral femoral pulses, 2+ bilateral dorsal pedal pulse Neuro: Alert and oriented. No facial asymmetry. No focal deficit. Moves all extremities spontaneously. Musculoskeletal: Normal muscle tone without kyphosis Psych:  Responds to questions appropriately with a normal affect.    Assessment: 74 year old male with known coronary artery disease status post previous PCI and stent placement diabetes with complication mixed hyperlipidemia essential hypertension with acute non-ST elevation myocardial infarction  Plan: 1. Heparin if April for further risk reduction in myocardial infarction closely for anemia and other concerns after chemotherapy 2. Proceed to cardiac catheterization to assess coronary anatomy and further treatment there are as necessary. Patient understands risk and benefits of cardiac catheterization. This includes possibility of death stroke heart attack infection bleeding or blood clot. He is at low risk for conscious sedation 3. Echocardiogram for LV systolic dysfunction valvular heart disease from non-ST elevation myocardial infarction 4. Continue high disease cholesterol therapy with statin 5. Injection for diabetes 6. Attention control with beta blocker 7. Further diagnostic testing treatment options after above  Signed, Corey Skains M.D. [facility] 06/01/2015, 1:07 PM

## 2015-06-01 NOTE — ED Notes (Signed)
Pt reports onset of left sided cp that started about 2 hours ago, associated with shortness of breath and tingling in the left arm/hand

## 2015-06-01 NOTE — Progress Notes (Addendum)
Initial Nutrition Assessment  INTERVENTION:   Meals and Snacks: Cater to patient preferences Medical Food Supplement Therapy:  Will send Sugar Free Might[employer] Shakes on meal trays for added nutrition.   NUTRITION DIAGNOSIS:   Inadequate oral intake related to acute illness as evidenced by  (NPO for lunch).  GOAL:   Patient will meet greater than or equal to 90% of their needs  MONITOR:    (Energy Intake, Glucose Profile, Anthropometrics)  REASON FOR ASSESSMENT:   Malnutrition Screening Tool    ASSESSMENT:   Pt admitted with acute NSTEMI. Pt with metastatic prostate cancer chemotherapy 2 weeks PTA.   Past Medical History  Diagnosis Date  . Abnormal EKG   . Coronary artery disease   . Diabetes mellitus without complication   . Obesity   . Hyperlipidemia   . Hypertension   . 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 Sleep apnea     could not tolerate cpap mask  . Arthritis     oa  . Cancer     bladder and prostate  . History of radiation therapy 2012  . History of chemotherapy 2014  . Bladder cancer      Diet Order:  Diet heart healthy/carb modified Room service appropriate?: Yes; Fluid consistency:: Thin    Current Nutrition: Pt NPO at lunch, pt ate 100% of breakfast this am  Food/Nutrition-Related History: Pt reports decreased appetite at times with chemotherapy but recently it has been ok. Pt reports usually eating cereal in the am, sandwich with fruit for lunch, and balanced dinner at night. Pt wife reports pt eats lots of fruit as well.  Pt reports no supplement drinks PTA.   Medications: Megace, MVI, Novolog, MVI  Electrolyte/Renal Profile and Glucose Profile:   Recent Labs Lab 05/28/15 0946 05/31/15 0853 06/01/15 0141  NA 129* 131* 134*  K 4.0 4.3 4.2  CL 101 105 105  CO2 20* 21* 20*  BUN 27* 24* 22*  CREATININE 1.13 0.99 0.93  CALCIUM 8.2* 8.4* 8.7*  GLUCOSE 335* 290* 316*   Protein Profile:  Recent Labs Lab 05/28/15 0946  05/31/15 0853 06/01/15 0141  ALBUMIN 3.2* 3.3* 3.1*    Gastrointestinal Profile: +ileostomy Last BM:  05/31/2015   Nutrition-Focused Physical Exam Findings: Nutrition-Focused physical exam completed. Findings are WDL for fat depletion, muscle depletion, and moderate b/l LLE edema per Nsg (2+).    Weight Change: Pt reports weight of 220lbs 3-4 months ago that got down to 185lbs but it has been 190lbs stable more recently. Per CHL weight fluctuates from 183-192lbs for the past 3 months.    Skin:  Reviewed, no issues  Height:   Ht Readings from Last 1 Encounters:  06/01/15 5\' 11"  (1.803 m)    Weight:   Wt Readings from Last 1 Encounters:  06/01/15 191 lb 4.8 oz (86.773 kg)    Wt Readings from Last 10 Encounters:  06/01/15 191 lb 4.8 oz (86.773 kg)  05/31/15 192 lb 7.4 oz (87.3 kg)  05/09/15 183 lb 10.3 oz (83.3 kg)  05/03/15 192 lb 5.6 oz (87.25 kg)  05/02/15 191 lb 12.8 oz (87 kg)  04/26/15 185 lb 13.6 oz (84.301 kg)  04/12/15 189 lb 9.5 oz (85.999 kg)  04/05/15 194 lb 10.7 oz (88.3 kg)  03/15/15 201 lb 11.5 oz (91.499 kg)  12/27/13 217 lb 9.5 oz (98.7 kg)    BMI:  Body mass index is 26.69 kg/(m^2).  Estimated Nutritional Needs:   Kcal:  5465-0354SFKCL, BEE: 1635kcals, TEE: (IF 1.1-1.3)(AF 1.2)  Protein:  87-104g protein (1.0-14.2g/kg)  Fluid:  2170-2649mL of fluid (25-69mL/kg)  EDUCATION NEEDS:   No education needs identified at this time  [place], RD, LDN Pager [phone]

## 2015-06-01 NOTE — ED Provider Notes (Signed)
[facility] Emergency Department Provider Note  ____________________________________________  Time seen: Approximately 1:17 AM  I have reviewed the triage vital signs and the nursing notes.   HISTORY  Chief Complaint Chest Pain    HPI Louis Alexander is a 74 y.o. male who presents to the ED from home with complaints of left-sided chest pressure that started approximately 2 hours ago while at rest. Patient has a history of CAD s/p stents 3. Describes 5/10 nonradiating left-sided chest pressure associated with shortness of breath and tingling in the left arm. Denies nausea/vomiting or diaphoresis. Patient took full-strength aspirin and one nitroglycerin prior to arrival which brought his pain from 10/10 to 5/10.Denies fever, chills, abdominal pain, diarrhea, headache, weakness. Patient has a history of bladder cancer; last chemotherapy 2 weeks ago.   Past Medical History  Diagnosis Date  . Abnormal EKG   . Coronary artery disease   . Diabetes mellitus without complication   . Obesity   . Hyperlipidemia   . Hypertension   . 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 Sleep apnea     could not tolerate cpap mask  . Arthritis     oa  . Cancer     bladder and prostate  . History of radiation therapy 2012  . History of chemotherapy 2014  . Bladder cancer     Patient Active Problem List   Diagnosis Date Noted  . Urothelial cancer 03/16/2015  . S/P right THA, AA 12/27/2013  . 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 CA of prostate 01/14/2013  . Incomplete bladder emptying 01/14/2013  . Frank hematuria 01/14/2013  . Difficult or painful urination 01/14/2013    Past Surgical History  Procedure Laterality Date  . Cardiac catheterization  12-06-2003  . Lad stent  2000 x1 stent, 2008 x 1 stent, 2012 x 1 stent  . Ptca  2001  . Hernia repair  yrs ago  . Appendectomy  many yrs ago  . Rotator cuff repair Left yrs ago  . Seed implant  to prostate with radiation  2012  . Protatectomy and urostomy  2014  . Total hip arthroplasty Right 12/27/2013    Procedure: RIGHT TOTAL HIP ARTHROPLASTY ANTERIOR APPROACH;  Surgeon: Mauri Pole, MD;  Location: [facility];  Service: Orthopedics;  Laterality: Right;    Current Outpatient Rx  Name  Route  Sig  Dispense  Refill  . aspirin EC 81 MG tablet   Oral   Take 81 mg by mouth daily.         [name] atenolol (TENORMIN) 25 MG tablet   Oral   Take 25 mg by mouth every morning.         [name] atorvastatin (LIPITOR) 40 MG tablet   Oral   Take 40 mg by mouth every evening.         . clopidogrel (PLAVIX) 75 MG tablet   Oral   Take 75 mg by mouth daily with breakfast.         . Coenzyme Q10 10 MG capsule   Oral   Take 10 mg by mouth.         . docusate sodium 100 MG CAPS   Oral   Take 100 mg by mouth 2 (two) times daily.   10 capsule   0   . fentaNYL (DURAGESIC - DOSED MCG/HR) 50 MCG/HR   Transdermal   Place 50 mcg onto the skin every 3 (three)  days.         . glipiZIDE (GLUCOTROL) 10 MG tablet   Oral   Take 10 mg by mouth daily before breakfast.         . lidocaine-prilocaine (EMLA) cream   Topical   Apply 1 application topically once.         . megestrol (MEGACE ES) 625 MG/5ML suspension      TAKE 5 MLS (625 MG TOTAL) BY MOUTH DAILY.   150 mL   2     No refills available   . megestrol (MEGACE ES) 625 MG/5ML suspension      TAKE 5 MLS (625 MG TOTAL) BY MOUTH DAILY.   150 mL   1     No refills available   . metFORMIN (GLUCOPHAGE) 500 MG tablet   Oral   Take 500 mg by mouth 2 (two) times daily with a meal.         . Multiple Vitamin (MULTIVITAMIN WITH MINERALS) TABS tablet   Oral   Take 1 tablet by mouth daily.           Allergies Celebrex and Effient  No family history on file.  Social History History  Substance Use Topics  . Smoking status: Never Smoker   . Smokeless tobacco: Never Used  . Alcohol Use: Yes     Comment: occasional     Review of Systems Constitutional: No fever/chills Eyes: No visual changes. ENT: No sore throat. Cardiovascular: Positive for chest pain. Respiratory: Positive for shortness of breath. Gastrointestinal: No abdominal pain.  No nausea, no vomiting.  No diarrhea.  No constipation. Genitourinary: Negative for dysuria. Musculoskeletal: Negative for back pain. Skin: Negative for rash. Neurological: Negative for headaches, focal weakness or numbness.  10-point ROS otherwise negative.  ____________________________________________   PHYSICAL EXAM:  VITAL SIGNS: ED Triage Vitals  Enc Vitals Group     BP 06/01/15 0113 145/92 mmHg     Pulse Rate 06/01/15 0113 88     Resp 06/01/15 0113 18     Temp 06/01/15 0113 98.4 F (36.9 C)     Temp Source 06/01/15 0113 Oral     SpO2 06/01/15 0113 99 %     Weight 06/01/15 0113 190 lb (86.183 kg)     Height 06/01/15 0113 5\' 11"  (1.803 m)     Head Cir --      Peak Flow --      Pain Score 06/01/15 0113 4     Pain Loc --      Pain Edu? --      Excl. in [place]? --     Constitutional: Alert and oriented. Well appearing and in mild acute distress. Eyes: Conjunctivae are normal. PERRL. EOMI. Head: Atraumatic. Nose: No congestion/rhinnorhea. Mouth/Throat: Mucous membranes are moist.  Oropharynx non-erythematous. Neck: No stridor.   Cardiovascular: Normal rate, regular rhythm. Grossly normal heart sounds.  Good peripheral circulation. Respiratory: Normal respiratory effort.  No retractions. Lungs CTAB. Gastrointestinal: Soft and nontender. No distention. No abdominal bruits. No CVA tenderness. Musculoskeletal: No lower extremity tenderness nor edema.  No joint effusions. Neurologic:  Normal speech and language. No gross focal neurologic deficits are appreciated. No gait instability. Skin:  Skin is warm, dry and intact. No rash noted. Psychiatric: Mood and affect are normal. Speech and behavior are  normal.  ____________________________________________   LABS (all labs ordered are listed, but only abnormal results are displayed)  Labs Reviewed  CBC WITH DIFFERENTIAL/PLATELET - Abnormal; Notable for the following:  WBC 11.8 (*)    RBC 3.31 (*)    Hemoglobin 10.0 (*)    HCT 30.0 (*)    RDW 20.5 (*)    All other components within normal limits  COMPREHENSIVE METABOLIC PANEL - Abnormal; Notable for the following:    Sodium 134 (*)    CO2 20 (*)    Glucose, Bld 316 (*)    BUN 22 (*)    Calcium 8.7 (*)    Total Protein 5.8 (*)    Albumin 3.1 (*)    ALT 14 (*)    All other components within normal limits  TROPONIN I  PROTIME-INR   ____________________________________________  EKG  ED ECG REPORT I, Dorota Heinrichs J, the attending physician, personally viewed and interpreted this ECG.   Date: 06/01/2015  EKG Time: 0110  Rate: 80  Rhythm: normal EKG, normal sinus rhythm  Axis: Normal  Intervals:first-degree A-V block   ST&T Change: Inverted T waves laterally No significant change from EKG dated October 25, 2012  ____________________________________________  RADIOLOGY  Portable chest x-ray (viewed by me, interpreted by Dr. Gerilyn Nestle): No active disease. ____________________________________________   PROCEDURES  Procedure(s) performed: None  Critical Care performed: Yes, see critical care note(s) CRITICAL CARE Performed by: Paulette Blanch   Total critical care time: 30 minutes   Critical care time was exclusive of separately billable procedures and treating other patients.  Critical care was necessary to treat or prevent imminent or life-threatening deterioration.  Critical care was time spent personally by me on the following activities: development of treatment plan with patient and/or surrogate as well as nursing, discussions with consultants, evaluation of patient's response to treatment, examination of patient, obtaining history from patient or surrogate,  ordering and performing treatments and interventions, ordering and review of laboratory studies, ordering and review of radiographic studies, pulse oximetry and re-evaluation of patient's condition.  ____________________________________________   INITIAL IMPRESSION / ASSESSMENT AND PLAN / ED COURSE  Pertinent labs & imaging results that were available during my care of the patient were reviewed by me and considered in my medical decision making (see chart for details).  19 -year-old male with a history of CAD s/p stents presenting with symptoms consistent with unstable angina. Patient took full-strength aspirin and one nitroglycerin prior to arrival which improved his pain to 5/10 currently. Will access port per patient's request; administer nitroglycerin, check screening lab work including troponin, obtain portable chest x-ray. Anticipate need for anticoagulation and likely hospital admission.  ----------------------------------------- 2:17 AM on 06/01/2015 -----------------------------------------  Patient pain-free after nitroglycerin; Nitropaste applied. Updated patient and spells of results. Will order therapeutic Lovenox dose. Will discuss case with hospitalist for evaluation in the emergency department for hospital admission. ____________________________________________   FINAL CLINICAL IMPRESSION(S) / ED DIAGNOSES  Final diagnoses:  Chest pain, unspecified chest pain type  Unstable angina  Coronary artery disease involving native coronary artery of native heart with unstable angina pectoris      Paulette Blanch, MD 06/01/15 [phone]

## 2015-06-01 NOTE — Care Management (Signed)
Patient present from home with elevated troponin.  He is currently undergoing chemo for urinary cancer.  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  He is able to care to device.  Cardiology consult is pending and there is discussion regarding cardiac cath.

## 2015-06-02 LAB — CBC
HCT: 32.9 % — ABNORMAL LOW (ref 40.0–52.0)
Hemoglobin: 10.5 g/dL — ABNORMAL LOW (ref 13.0–18.0)
MCH: 28.9 pg (ref 26.0–34.0)
MCHC: 31.9 g/dL — AB (ref 32.0–36.0)
MCV: 90.6 fL (ref 80.0–100.0)
Platelets: 255 10*3/uL (ref 150–440)
RBC: 3.63 MIL/uL — ABNORMAL LOW (ref 4.40–5.90)
RDW: 20.5 % — AB (ref 11.5–14.5)
WBC: 11.7 10*3/uL — ABNORMAL HIGH (ref 3.8–10.6)

## 2015-06-02 LAB — GLUCOSE, CAPILLARY: Glucose-Capillary: 137 mg/dL — ABNORMAL HIGH (ref 65–99)

## 2015-06-02 LAB — HEPARIN LEVEL (UNFRACTIONATED): Heparin Unfractionated: 0.16 IU/mL — ABNORMAL LOW (ref 0.30–0.70)

## 2015-06-02 MED ORDER — NITROGLYCERIN 0.4 MG SL SUBL: 0.4000 mg | SUBLINGUAL_TABLET | SUBLINGUAL | Status: DC | PRN

## 2015-06-02 MED ORDER — ISOSORBIDE MONONITRATE ER 30 MG PO TB24: 30.0000 mg | ORAL_TABLET | Freq: Every day | ORAL | Status: DC

## 2015-06-02 MED ORDER — HEPARIN BOLUS VIA INFUSION
2600.0000 [IU] | Freq: Once | INTRAVENOUS | Status: AC
  Administered 2015-06-02: 2600 [IU] via INTRAVENOUS
  Filled 2015-06-02: qty 2600

## 2015-06-02 MED ORDER — HEPARIN SOD (PORK) LOCK FLUSH 100 UNIT/ML IV SOLN
500.0000 [IU] | Freq: Once | INTRAVENOUS | Status: AC
  Administered 2015-06-02: 500 [IU] via INTRAVENOUS
  Filled 2015-06-02: qty 5

## 2015-06-02 NOTE — Discharge Summary (Signed)
Louis Alexander, 74 y.o., DOB 03-Jul-1941, MRN [id number]. Admission date: 06/01/2015 Discharge Date 06/02/2015 Primary MD Lelon Huh, MD Admitting Physician Lytle Butte, MD  Admission Diagnosis  Unstable angina [I20.0] Chest pain, unspecified chest pain type [R07.9] Coronary artery disease involving native coronary artery of native heart with unstable angina pectoris [I25.110]  Discharge Diagnosis   Principal Problem:   Chest pain, central Active Problems:   NSTEMI, initial episode of care  type 2 diabetes non-insulin-requiring  Essential hypertension: Atenolol  Hyperlipidemia unspecified: Lipitor Metastatic prostate cancer Coronary artery disease Sleep apnea Bladder and prostate cancer         Hospital CourseDonald Alexander is a 74 y.o. male with a known history of coronary artery disease status post PCI and stent placement 3 as well as metastatic prostate cancer presenting with chest pain. Patient was admitted and planned to have cardiac catheter on Friday however there was some issues with the cath lab therefore his procedure was canceled. Patient was seen by cardiology and started on a heparin drip and they recommended that he have the procedure on Monday. When I went to see the patient today he stated that he wanted to go home and come back on Monday. I explained to him that he needed to stay until Monday but he was anxious about going home. I discussed the case with Dr. Jose Persia who also felt that he needed this day but patient was insisting to go home therefore he is being discharged. He was recommended to score him to special procedures 6:00 in the morning sharp so his procedure could be done.            Consults  cardiology  Significant Tests:  See full reports for all details    Dg Chest Port 1 View  06/01/2015   CLINICAL DATA:  Left-sided chest pain starting about 2 hours ago. Shortness of breath and tingling in the left arm and hand.  EXAM: PORTABLE  CHEST - 1 VIEW  COMPARISON:  12/21/2013  FINDINGS: Right power port type central venous catheter with tip over the lower SVC. No pneumothorax. Metallic foreign body again demonstrated over the left chest. No change since prior study. The heart size and mediastinal contours are within normal limits. Both lungs are clear. The visualized skeletal structures are unremarkable.  IMPRESSION: No active disease.   Electronically Signed   By: Lucienne Capers M.D.   On: 06/01/2015 02:01       Today   Subjective:   Louis Alexander feels well denies any chest pain or shortness of breath anxious to go home  Objective:   Blood pressure 114/74, pulse 64, temperature 98.7 F (37.1 C), temperature source Oral, resp. rate 22, height 5\' 11"  (1.803 m), weight 86.773 kg (191 lb 4.8 oz), SpO2 99 %.  .  Intake/Output Summary (Last 24 hours) at 06/02/15 1324 Last data filed at 06/02/15 0830  Gross per 24 hour  Intake 694.42 ml  Output   4900 ml  Net -4205.58 ml    Exam VITAL SIGNS: Blood pressure 114/74, pulse 64, temperature 98.7 F (37.1 C), temperature source Oral, resp. rate 22, height 5\' 11"  (1.803 m), weight 86.773 kg (191 lb 4.8 oz), SpO2 99 %.  GENERAL:  74 y.o.-year-old patient lying in the bed with no acute distress.  EYES: Pupils equal, round, reactive to light and accommodation. No scleral icterus. Extraocular muscles intact.  HEENT: Head atraumatic, normocephalic. Oropharynx and nasopharynx clear.  NECK:  Supple, no jugular venous distention. No  thyroid enlargement, no tenderness.  LUNGS: Normal breath sounds bilaterally, no wheezing, rales,rhonchi or crepitation. No use of accessory muscles of respiration.  CARDIOVASCULAR: S1, S2 normal. No murmurs, rubs, or gallops.  ABDOMEN: Soft, nontender, nondistended. Bowel sounds present. No organomegaly or mass.  EXTREMITIES: No pedal edema, cyanosis, or clubbing.  NEUROLOGIC: Cranial nerves II through XII are intact. Muscle strength 5/5 in all  extremities. Sensation intact. Gait not checked.  PSYCHIATRIC: The patient is alert and oriented x 3.  SKIN: No obvious rash, lesion, or ulcer.   Data Review     CBC w Diff: Lab Results  Component Value Date   WBC 11.7* 06/02/2015   WBC 7.2 03/15/2015   HGB 10.5* 06/02/2015   HGB 11.9* 03/15/2015   HCT 32.9* 06/02/2015   HCT 36.1* 03/15/2015   PLT 255 06/02/2015   PLT 502* 03/15/2015   LYMPHOPCT 21 06/01/2015   LYMPHOPCT 16.0 03/15/2015   BANDSPCT 4 06/01/2015   MONOPCT 5 06/01/2015   MONOPCT 16.3 03/15/2015   EOSPCT 0 06/01/2015   EOSPCT 3.5 03/15/2015   BASOPCT 0 06/01/2015   BASOPCT 0.5 03/15/2015   CMP: Lab Results  Component Value Date   NA 134* 06/01/2015   NA 133* 03/15/2015   K 4.2 06/01/2015   K 4.5 03/15/2015   CL 105 06/01/2015   CL 100* 03/15/2015   CO2 20* 06/01/2015   CO2 26 03/15/2015   BUN 22* 06/01/2015   BUN 21* 03/15/2015   CREATININE 0.93 06/01/2015   CREATININE 0.96 03/15/2015   PROT 5.8* 06/01/2015   PROT 6.6 03/15/2015   ALBUMIN 3.1* 06/01/2015   ALBUMIN 3.3* 03/15/2015   BILITOT 0.3 06/01/2015   ALKPHOS 82 06/01/2015   ALKPHOS 105 03/15/2015   AST 18 06/01/2015   AST 20 03/15/2015   ALT 14* 06/01/2015   ALT 20 03/15/2015  .  Micro Results No results found for this or any previous visit (from the past 240 hour(s)).      Code Status Orders        Start     Ordered   06/01/15 0229  Full code   Continuous     06/01/15 0229    Advance Directive Documentation        Most Recent Value   Type of Advance Directive  Living will   Pre-existing out of facility DNR order (yellow form or pink MOST form)     "MOST" Form in Place?            Follow-up Information    Follow up with Corey Skains, MD In 2 days.   Specialty:  Internal Medicine   Why:  Cath   Contact information:   [place] [address] [phone]       Discharge Medications     Medication List    STOP taking these  medications        atenolol 25 MG tablet  Commonly known as:  TENORMIN      TAKE these medications        aspirin EC 81 MG tablet  Take 81 mg by mouth daily.     atorvastatin 40 MG tablet  Commonly known as:  LIPITOR  Take 40 mg by mouth every evening.     clopidogrel 75 MG tablet  Commonly known as:  PLAVIX  Take 75 mg by mouth daily with breakfast.     Coenzyme Q10 10 MG capsule  Take 10 mg by mouth.  DSS 100 MG Caps  Take 100 mg by mouth 2 (two) times daily.     fentaNYL 50 MCG/HR  Commonly known as:  DURAGESIC - dosed mcg/hr  Place 50 mcg onto the skin every 3 (three) days.     glipiZIDE 10 MG tablet  Commonly known as:  GLUCOTROL  Take 10 mg by mouth daily before breakfast.     isosorbide mononitrate 30 MG 24 hr tablet  Commonly known as:  IMDUR  Take 1 tablet (30 mg total) by mouth daily.     lidocaine-prilocaine cream  Commonly known as:  EMLA  Apply 1 application topically once.     megestrol 625 MG/5ML suspension  Commonly known as:  MEGACE ES  TAKE 5 MLS (625 MG TOTAL) BY MOUTH DAILY.     megestrol 625 MG/5ML suspension  Commonly known as:  MEGACE ES  TAKE 5 MLS (625 MG TOTAL) BY MOUTH DAILY.     metFORMIN 500 MG tablet  Commonly known as:  GLUCOPHAGE  Take 500 mg by mouth 2 (two) times daily with a meal.     multivitamin with minerals Tabs tablet  Take 1 tablet by mouth daily.     nitroGLYCERIN 0.4 MG SL tablet  Commonly known as:  NITROSTAT  Place 1 tablet (0.4 mg total) under the tongue every 5 (five) minutes as needed for chest pain (Do not give more than 3 SL tablets in 15 minutes.).           Total Time in preparing paper work, data evaluation and todays exam - 35 minutes  Dustin Flock M.D on 06/02/2015 at 1:24 PM  [facility]   Office  [phone]

## 2015-06-02 NOTE — Discharge Instructions (Signed)
DIET:  Cardiac diet  DISCHARGE CONDITION:  Stable  ACTIVITY:  Activity as tolerated  OXYGEN:  Home Oxygen: No.   Oxygen Delivery: room air  DISCHARGE LOCATION:  home    ADDITIONAL DISCHARGE INSTRUCTION: if chest pain reoccures then come back to ER Need to go to specials procedures on Monday morning at 6am   If you experience worsening of your admission symptoms, develop shortness of breath, life threatening emergency, suicidal or homicidal thoughts you must seek medical attention immediately by calling 911 or calling your MD immediately  if symptoms less severe.  You Must read complete instructions/literature along with all the possible adverse reactions/side effects for all the Medicines you take and that have been prescribed to you. Take any new Medicines after you have completely understood and accpet all the possible adverse reactions/side effects.   Please note  You were cared for by a hospitalist during your hospital stay. If you have any questions about your discharge medications or the care you received while you were in the hospital after you are discharged, you can call the unit and asked to speak with the hospitalist on call if the hospitalist that took care of you is not available. Once you are discharged, your primary care physician will handle any further medical issues. Please note that NO REFILLS for any discharge medications will be authorized once you are discharged, as it is imperative that you return to your primary care physician (or establish a relationship with a primary care physician if you do not have one) for your aftercare needs so that they can reassess your need for medications and monitor your lab values.  Coronary Angiogram A coronary angiogram, also called coronary angiography, is an X-ray procedure used to look at the arteries in the heart. In this procedure, a dye (contrast dye) is injected through a long, hollow tube (catheter). The catheter is  about the size of a piece of cooked spaghetti and is inserted through your groin, wrist, or arm. The dye is injected into each artery, and X-rays are then taken to show if there is a blockage in the arteries of your heart. LET [facility] CARE PROVIDER KNOW ABOUT:  Any allergies you have, including allergies to shellfish or contrast dye.   All medicines you are taking, including vitamins, herbs, eye drops, creams, and over-the-counter medicines.   Previous problems you or members of your family have had with the use of anesthetics.   Any blood disorders you have.   Previous surgeries you have had.  History of kidney problems or failure.   Other medical conditions you have. RISKS AND COMPLICATIONS  Generally, a coronary angiogram is a safe procedure. However, problems can occur and include:  Allergic reaction to the dye.  Bleeding from the access site or other locations.  Kidney injury, especially in people with impaired kidney function.  Stroke (rare).  Heart attack (rare). BEFORE THE PROCEDURE   Do not eat or drink anything after midnight the night before the procedure or as directed by your health care provider.   Ask your health care provider about changing or stopping your regular medicines. This is especially important if you are taking diabetes medicines or blood thinners. PROCEDURE  You may be given a medicine to help you relax (sedative) before the procedure. This medicine is given through an intravenous (IV) access tube that is inserted into one of your veins.   The area where the catheter will be inserted will be washed and shaved. This  is usually done in the groin but may be done in the fold of your arm (near your elbow) or in the wrist.   A medicine will be given to numb the area where the catheter will be inserted (local anesthetic).   The health care provider will insert the catheter into an artery. The catheter will be guided by using a special type  of X-ray (fluoroscopy) of the blood vessel being examined.   A special dye will then be injected into the catheter, and X-rays will be taken. The dye will help to show where any narrowing or blockages are located in the heart arteries.  AFTER THE PROCEDURE   If the procedure is done through the leg, you will be kept in bed lying flat for several hours. You will be instructed to not bend or cross your legs.  The insertion site will be checked frequently.   The pulse in your feet or wrist will be checked frequently.   Additional blood tests, X-rays, and an electrocardiogram may be done.  Document Released: 05/10/2003 Document Revised: 03/20/2014 Document Reviewed: 03/28/2013 [facility] Patient Information 2015 [place], [place]. This information is not intended to replace advice given to you by your health care provider. Make sure you discuss any questions you have with your health care provider.

## 2015-06-02 NOTE — Progress Notes (Signed)
ANTICOAGULATION CONSULT NOTE - Initial Consult  Pharmacy Consult for Heparin Drip Indication: chest pain/ACS  Allergies  Allergen Reactions  . Celebrex [Celecoxib]     Other reaction(s): Bleeding Bleeding in bladder  . Effient [Prasugrel] Other (See Comments)    Other reaction(s): Bleeding Bleeding in bladder    Patient Measurements: Height: 5\' 11"  (180.3 cm) Weight: 191 lb 4.8 oz (86.773 kg) IBW/kg (Calculated) : 75.3 Heparin Dosing Weight: 86.8 kg  Vital Signs: Temp: 98.7 F (37.1 C) (07/16 0419) Temp Source: Oral (07/16 0419) BP: 133/89 mmHg (07/16 0419) Pulse Rate: 66 (07/16 0419)  Labs:  Recent Labs  05/31/15 0853  06/01/15 0141 06/01/15 0445 06/01/15 0825 06/01/15 1052 06/01/15 1655 06/02/15 0425  HGB 10.4*  --  10.0*  --   --   --   --   --   HCT 32.0*  --  30.0*  --   --   --   --   --   PLT 261  --  249  --   --   --   --   --   APTT  --   --   --   --  24  --   --   --   LABPROT  --   --  14.6  --   --   --   --   --   INR  --   --  1.12  --   --   --   --   --   HEPARINUNFRC  --   --   --   --  <0.10*  --  1.21* 0.16*  CREATININE 0.99  --  0.93  --   --   --   --   --   TROPONINI  --   < > <0.03 0.18*  --  0.30* 0.25*  --   < > = values in this interval not displayed.  Estimated Creatinine Clearance: 76.5 mL/min (by C-G formula based on Cr of 0.93).   Medical History: Past Medical History  Diagnosis Date  . Abnormal EKG   . Coronary artery disease   . Diabetes mellitus without complication   . Obesity   . Hyperlipidemia   . Hypertension   . 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 Sleep apnea     could not tolerate cpap mask  . Arthritis     oa  . Cancer     bladder and prostate  . History of radiation therapy 2012  . History of chemotherapy 2014  . Bladder cancer     Medications:  Scheduled:  . aspirin EC  81 mg Oral Daily  . atenolol  25 mg Oral q morning - 10a  . atorvastatin  40 mg Oral QPM  . clopidogrel  75 mg Oral  Q breakfast  . docusate sodium  100 mg Oral BID  . fentaNYL  50 mcg Transdermal Q72H  . heparin  2,600 Units Intravenous Once  . insulin aspart  0-5 Units Subcutaneous QHS  . insulin aspart  0-9 Units Subcutaneous TID WC  . megestrol  800 mg Oral Daily  . multivitamin with minerals  1 tablet Oral Daily  . sodium chloride  3 mL Intravenous Q12H  . sodium chloride  3 mL Intravenous Q12H   Infusions:  . sodium chloride     Followed by  . sodium chloride    . heparin 1,100 Units/hr (06/01/15 1900)    Assessment: Pharmacy  consulted to dose Heparin drip for patient with NSTEMI.  HL subtherapeutic at 0.16.  Baseline Labs: APTT: 24, INR: 1.12, Hgb: 10, Plts: 249  HL at 0425 on 7/16: 0.16  Goal of Therapy:  Heparin level 0.3-0.7 units/ml Monitor platelets by anticoagulation protocol: Yes   Plan:  Will order heparin bolus of 2600 units IV once. Will increase heparin drip rate to 1350 units/hr.  Will recheck HL in 8 hours at 1330. CBC ordered in AM.  Murrell Converse, PharmD Clinical Pharmacist 06/02/2015

## 2015-06-02 NOTE — Progress Notes (Signed)
Pt. Discharged to home with wife. Discharge instructions and medication regimen reviewed at bedside with patient. Pt. verbalizes understanding of instructions and medication regimen. Pt has prescriptions in hand that he will fill at his pharmacy. Patient assessment unchanged from this morning. TELE discontinued per policy and port-a-cath de-accessed per policy.

## 2015-06-02 NOTE — Progress Notes (Signed)
[facility] Encounter Note  Patient: DENZIL MCEACHRON / Admit Date: 06/01/2015 / Date of Encounter: 06/02/2015, 9:10 AM   Subjective: Hemodynamically stable without evidence of chest discomfort or shortness of breath  Review of Systems: Positive for: None Negative for: Vision change, hearing change, syncope, dizziness, nausea, vomiting,diarrhea, bloody stool, stomach pain, cough, congestion, diaphoresis, urinary frequency, urinary pain,skin lesions, skin rashes Others previously listed  Objective: Telemetry: Normal sinus rhythm Physical Exam: Blood pressure 133/89, pulse 66, temperature 98.7 F (37.1 C), temperature source Oral, resp. rate 19, height 5\' 11"  (1.803 m), weight 191 lb 4.8 oz (86.773 kg), SpO2 98 %. Body mass index is 26.69 kg/(m^2). General: Well developed, well nourished, in no acute distress. Head: Normocephalic, atraumatic, sclera non-icteric, no xanthomas, nares are without discharge. Neck: No apparent masses Lungs: Normal respirations with no wheezes, no rhonchi, no rales , no crackles   Heart: Regular rate and rhythm, normal S1 S2, no murmur, no rub, no gallop, PMI is normal size and placement, carotid upstroke normal without bruit, jugular venous pressure normal Abdomen: Soft, non-tender, non-distended with normoactive bowel sounds. No hepatosplenomegaly. Abdominal aorta is normal size without bruit Extremities: No edema, no clubbing, no cyanosis, no ulcers,  Peripheral: 2+ radial, 2+ femoral, 2+ dorsal pedal pulses Neuro: Alert and oriented. Moves all extremities spontaneously. Psych:  Responds to questions appropriately with a normal affect.   Intake/Output Summary (Last 24 hours) at 06/02/15 0910 Last data filed at 06/02/15 0335  Gross per 24 hour  Intake 334.42 ml  Output   5800 ml  Net -5465.58 ml    Inpatient Medications:  . aspirin EC  81 mg Oral Daily  . atenolol  25 mg Oral q morning - 10a  . atorvastatin  40 mg Oral QPM   . clopidogrel  75 mg Oral Q breakfast  . docusate sodium  100 mg Oral BID  . fentaNYL  50 mcg Transdermal Q72H  . insulin aspart  0-5 Units Subcutaneous QHS  . insulin aspart  0-9 Units Subcutaneous TID WC  . megestrol  800 mg Oral Daily  . multivitamin with minerals  1 tablet Oral Daily  . sodium chloride  3 mL Intravenous Q12H  . sodium chloride  3 mL Intravenous Q12H   Infusions:  . sodium chloride    . heparin 1,350 Units/hr (06/02/15 0719)    Labs:  Recent Labs  05/31/15 0853 06/01/15 0141  NA 131* 134*  K 4.3 4.2  CL 105 105  CO2 21* 20*  GLUCOSE 290* 316*  BUN 24* 22*  CREATININE 0.99 0.93  CALCIUM 8.4* 8.7*    Recent Labs  05/31/15 0853 06/01/15 0141  AST 16 18  ALT 15* 14*  ALKPHOS 81 82  BILITOT 0.4 0.3  PROT 6.2* 5.8*  ALBUMIN 3.3* 3.1*    Recent Labs  05/31/15 0853 06/01/15 0141 06/02/15 0425  WBC 11.3* 11.8* 11.7*  NEUTROABS 8.0* 8.7*  --   HGB 10.4* 10.0* 10.5*  HCT 32.0* 30.0* 32.9*  MCV 90.2 90.5 90.6  PLT 261 249 255    Recent Labs  06/01/15 0141 06/01/15 0445 06/01/15 1052 06/01/15 1655  TROPONINI <0.03 0.18* 0.30* 0.25*   Invalid input(s): POCBNP No results for input(s): HGBA1C in the last 72 hours.   Weights: Filed Weights   06/01/15 0113 06/01/15 0411  Weight: 190 lb (86.183 kg) 191 lb 4.8 oz (86.773 kg)     Radiology/Studies:  Dg Chest Port 1 View  06/01/2015   CLINICAL DATA:  Left-sided chest pain starting about 2 hours ago. Shortness of breath and tingling in the left arm and hand.  EXAM: PORTABLE CHEST - 1 VIEW  COMPARISON:  12/21/2013  FINDINGS: Right power port type central venous catheter with tip over the lower SVC. No pneumothorax. Metallic foreign body again demonstrated over the left chest. No change since prior study. The heart size and mediastinal contours are within normal limits. Both lungs are clear. The visualized skeletal structures are unremarkable.  IMPRESSION: No active disease.   Electronically  Signed   By: Lucienne Capers M.D.   On: 06/01/2015 02:01     Assessment and Recommendation  74 y.o. male with known coronary artery disease status post previous PCI and stent placement with acute non-ST elevation myocardial infarction essential hypertension and mixed hyperlipidemia 1. Continue heparin for further risk reduction of myocardial infarction 2. Atenolol for hypertension control as well as risk reduction of further myocardial infarction 3. Echocardiogram for LV systolic dysfunction valvular heart disease contributing to above 4. Cardiac catheterization to assess coronary anatomy and further treatment thereof is necessary on Monday  Signed, Serafina Royals M.D. FACC

## 2015-06-02 NOTE — Progress Notes (Signed)
[facility] [place] NAME: Louis Alexander    MR#:  [id number]  DATE OF BIRTH:  08-16-1941  SUBJECTIVE:  CHIEF COMPLAINT:   Chief Complaint  Patient presents with  . Chest Pain    On heparin drip.  REVIEW OF SYSTEMS:  CONSTITUTIONAL: No fever, fatigue or weakness.  EYES: No blurred or double vision.  EARS, NOSE, AND THROAT: No tinnitus or ear pain.  RESPIRATORY: No cough, shortness of breath, wheezing or hemoptysis.  CARDIOVASCULAR: No chest pain, orthopnea, edema.  GASTROINTESTINAL: No nausea, vomiting, diarrhea or abdominal pain.  GENITOURINARY: No dysuria, hematuria.  ENDOCRINE: No polyuria, nocturia,  HEMATOLOGY: No anemia, easy bruising or bleeding SKIN: No rash or lesion. MUSCULOSKELETAL: No joint pain or arthritis.   NEUROLOGIC: No tingling, numbness, weakness.  PSYCHIATRY: No anxiety or depression.   ROS  DRUG ALLERGIES:   Allergies  Allergen Reactions  . Celebrex [Celecoxib]     Other reaction(s): Bleeding Bleeding in bladder  . Effient [Prasugrel] Other (See Comments)    Other reaction(s): Bleeding Bleeding in bladder    VITALS:  Blood pressure 114/74, pulse 64, temperature 98.7 F (37.1 C), temperature source Oral, resp. rate 22, height 5\' 11"  (1.803 m), weight 86.773 kg (191 lb 4.8 oz), SpO2 99 %.  PHYSICAL EXAMINATION:  GENERAL:  74 y.o.-year-old patient lying in the bed with no acute distress.  EYES: Pupils equal, round, reactive to light and accommodation. No scleral icterus. Extraocular muscles intact.  HEENT: Head atraumatic, normocephalic. Oropharynx and nasopharynx clear.  NECK:  Supple, no jugular venous distention. No thyroid enlargement, no tenderness.  LUNGS: Normal breath sounds bilaterally, no wheezing, rales,rhonchi or crepitation. No use of accessory muscles of respiration.  CARDIOVASCULAR: S1, S2 normal. No murmurs, rubs, or gallops.  ABDOMEN: Soft, nontender, nondistended. Bowel  sounds present. No organomegaly or mass.  EXTREMITIES: No pedal edema, cyanosis, or clubbing.  NEUROLOGIC: Cranial nerves II through XII are intact. Muscle strength 5/5 in all extremities. Sensation intact. Gait not checked. Generalized weak. PSYCHIATRIC: The patient is alert and oriented x 3.  SKIN: No obvious rash, lesion, or ulcer.   Physical Exam LABORATORY PANEL:   CBC  Recent Labs Lab 06/02/15 0425  WBC 11.7*  HGB 10.5*  HCT 32.9*  PLT 255   ------------------------------------------------------------------------------------------------------------------  Chemistries   Recent Labs Lab 06/01/15 0141  NA 134*  K 4.2  CL 105  CO2 20*  GLUCOSE 316*  BUN 22*  CREATININE 0.93  CALCIUM 8.7*  AST 18  ALT 14*  ALKPHOS 82  BILITOT 0.3   ------------------------------------------------------------------------------------------------------------------  Cardiac Enzymes  Recent Labs Lab 06/01/15 1052 06/01/15 1655  TROPONINI 0.30* 0.25*   ------------------------------------------------------------------------------------------------------------------  RADIOLOGY:  Dg Chest Port 1 View  06/01/2015   CLINICAL DATA:  Left-sided chest pain starting about 2 hours ago. Shortness of breath and tingling in the left arm and hand.  EXAM: PORTABLE CHEST - 1 VIEW  COMPARISON:  12/21/2013  FINDINGS: Right power port type central venous catheter with tip over the lower SVC. No pneumothorax. Metallic foreign body again demonstrated over the left chest. No change since prior study. The heart size and mediastinal contours are within normal limits. Both lungs are clear. The visualized skeletal structures are unremarkable.  IMPRESSION: No active disease.   Electronically Signed   By: Lucienne Capers M.D.   On: 06/01/2015 02:01    ASSESSMENT AND PLAN:    74 year old Caucasian gentleman history coronary artery disease status post PCI stent  placement 3 presenting with chest pain.  1.  NSTEMI:   As per cardiologist, on heparin drip, awaiting Cardiac cath.    aspirin/statin therapy, on telemetry, trend cardiac enzymes  2. Type 2 diabetes non-insulin-requiring: Hold oral agents at insulin sliding scale with Accu-Cheks 3. Essential hypertension: Atenolol 4. Hyperlipidemia unspecified: Lipitor 5. Venous thromboembolism prophylactic: Heparin IV drip.  All the records are reviewed and case discussed with Care Management/Social Workerr. Management plans discussed with the patient, family and they are in agreement.  CODE STATUS: full  TOTAL TIME TAKING CARE OF THIS PATIENT: 40 minutes.   More than 50% of the visit was spent in counseling/coordination of care  POSSIBLE D/C IN 2-3 DAYS, DEPENDING ON CLINICAL CONDITION.   Vaughan Basta M.D on 06/02/2015   Between 7am to 6pm - Pager - [phone]  After 6pm go to [email or web address] - password [email or web address] [facility]  Office  [phone]  CC: Primary care physician; Lelon Huh, MD

## 2015-06-04 ENCOUNTER — Ambulatory Visit
Admission: RE | Admit: 2015-06-04 | Discharge: 2015-06-05 | Disposition: A | Discharge status: Home / Self Care | Payer: PPO | Source: Ambulatory Visit | Attending: Internal Medicine | Admitting: Internal Medicine

## 2015-06-04 ENCOUNTER — Encounter
Admission: RE | Disposition: A | Discharge status: Home / Self Care | Payer: Self-pay | Source: Ambulatory Visit | Attending: Internal Medicine

## 2015-06-04 ENCOUNTER — Encounter: Payer: Self-pay | Admitting: *Deleted

## 2015-06-04 DIAGNOSIS — Z6825 Body mass index (BMI) 25.0-25.9, adult: Secondary | ICD-10-CM | POA: Diagnosis not present

## 2015-06-04 DIAGNOSIS — Z8551 Personal history of malignant neoplasm of bladder: Secondary | ICD-10-CM | POA: Insufficient documentation

## 2015-06-04 DIAGNOSIS — E780 Pure hypercholesterolemia: Secondary | ICD-10-CM | POA: Diagnosis not present

## 2015-06-04 DIAGNOSIS — Z8546 Personal history of malignant neoplasm of prostate: Secondary | ICD-10-CM | POA: Insufficient documentation

## 2015-06-04 DIAGNOSIS — R202 Paresthesia of skin: Secondary | ICD-10-CM | POA: Insufficient documentation

## 2015-06-04 DIAGNOSIS — Z888 Allergy status to other drugs, medicaments and biological substances status: Secondary | ICD-10-CM | POA: Diagnosis not present

## 2015-06-04 DIAGNOSIS — Z79899 Other long term (current) drug therapy: Secondary | ICD-10-CM | POA: Diagnosis not present

## 2015-06-04 DIAGNOSIS — G4730 Sleep apnea, unspecified: Secondary | ICD-10-CM | POA: Diagnosis not present

## 2015-06-04 DIAGNOSIS — I209 Angina pectoris, unspecified: Secondary | ICD-10-CM | POA: Diagnosis present

## 2015-06-04 DIAGNOSIS — Z7982 Long term (current) use of aspirin: Secondary | ICD-10-CM | POA: Diagnosis not present

## 2015-06-04 DIAGNOSIS — Z823 Family history of stroke: Secondary | ICD-10-CM | POA: Insufficient documentation

## 2015-06-04 DIAGNOSIS — I2510 Atherosclerotic heart disease of native coronary artery without angina pectoris: Secondary | ICD-10-CM

## 2015-06-04 DIAGNOSIS — Z9221 Personal history of antineoplastic chemotherapy: Secondary | ICD-10-CM | POA: Insufficient documentation

## 2015-06-04 DIAGNOSIS — Z96641 Presence of right artificial hip joint: Secondary | ICD-10-CM | POA: Diagnosis not present

## 2015-06-04 DIAGNOSIS — M1990 Unspecified osteoarthritis, unspecified site: Secondary | ICD-10-CM | POA: Diagnosis not present

## 2015-06-04 DIAGNOSIS — E785 Hyperlipidemia, unspecified: Secondary | ICD-10-CM | POA: Diagnosis not present

## 2015-06-04 DIAGNOSIS — R0602 Shortness of breath: Secondary | ICD-10-CM | POA: Diagnosis not present

## 2015-06-04 DIAGNOSIS — E669 Obesity, unspecified: Secondary | ICD-10-CM | POA: Diagnosis not present

## 2015-06-04 DIAGNOSIS — I25119 Atherosclerotic heart disease of native coronary artery with unspecified angina pectoris: Secondary | ICD-10-CM | POA: Insufficient documentation

## 2015-06-04 DIAGNOSIS — E119 Type 2 diabetes mellitus without complications: Secondary | ICD-10-CM | POA: Diagnosis not present

## 2015-06-04 DIAGNOSIS — I214 Non-ST elevation (NSTEMI) myocardial infarction: Secondary | ICD-10-CM | POA: Diagnosis present

## 2015-06-04 DIAGNOSIS — I252 Old myocardial infarction: Secondary | ICD-10-CM | POA: Diagnosis not present

## 2015-06-04 DIAGNOSIS — I10 Essential (primary) hypertension: Secondary | ICD-10-CM | POA: Insufficient documentation

## 2015-06-04 HISTORY — PX: CARDIAC CATHETERIZATION: SHX172

## 2015-06-04 LAB — GLUCOSE, CAPILLARY
GLUCOSE-CAPILLARY: 208 mg/dL — AB (ref 65–99)
Glucose-Capillary: 160 mg/dL — ABNORMAL HIGH (ref 65–99)
Glucose-Capillary: 122 mg/dL — ABNORMAL HIGH (ref 65–99)

## 2015-06-04 SURGERY — LEFT HEART CATH AND CORONARY ANGIOGRAPHY
Anesthesia: Moderate Sedation

## 2015-06-04 MED ORDER — FENTANYL 50 MCG/HR TD PT72: 50.0000 ug | MEDICATED_PATCH | TRANSDERMAL | Status: DC

## 2015-06-04 MED ORDER — BIVALIRUDIN 250 MG IV SOLR
250.0000 mg | INTRAVENOUS | Status: DC | PRN
  Administered 2015-06-04: 1.75 mg/kg/h via INTRAVENOUS

## 2015-06-04 MED ORDER — BIVALIRUDIN BOLUS VIA INFUSION - CUPID
INTRAVENOUS | Status: DC | PRN
  Administered 2015-06-04: 64.65 mg via INTRAVENOUS

## 2015-06-04 MED ORDER — CLOPIDOGREL BISULFATE 75 MG PO TABS
75.0000 mg | ORAL_TABLET | Freq: Every day | ORAL | Status: DC
  Administered 2015-06-05: 75 mg via ORAL
  Filled 2015-06-04: qty 1

## 2015-06-04 MED ORDER — ISOSORBIDE MONONITRATE ER 30 MG PO TB24
30.0000 mg | ORAL_TABLET | Freq: Every day | ORAL | Status: DC
  Administered 2015-06-04 – 2015-06-05 (×2): 30 mg via ORAL
  Filled 2015-06-04 (×2): qty 1

## 2015-06-04 MED ORDER — ATORVASTATIN CALCIUM 20 MG PO TABS
40.0000 mg | ORAL_TABLET | Freq: Every evening | ORAL | Status: DC
  Administered 2015-06-04: 40 mg via ORAL
  Filled 2015-06-04: qty 2

## 2015-06-04 MED ORDER — ASPIRIN EC 81 MG PO TBEC
81.0000 mg | DELAYED_RELEASE_TABLET | Freq: Every day | ORAL | Status: DC
  Administered 2015-06-05: 81 mg via ORAL
  Filled 2015-06-04: qty 1

## 2015-06-04 MED ORDER — ONDANSETRON HCL 4 MG/2ML IJ SOLN: 4.0000 mg | Freq: Four times a day (QID) | INTRAMUSCULAR | Status: DC | PRN

## 2015-06-04 MED ORDER — FENTANYL CITRATE (PF) 100 MCG/2ML IJ SOLN
INTRAMUSCULAR | Status: AC
  Filled 2015-06-04: qty 2

## 2015-06-04 MED ORDER — DOCUSATE SODIUM 100 MG PO CAPS
100.0000 mg | ORAL_CAPSULE | Freq: Two times a day (BID) | ORAL | Status: DC
  Filled 2015-06-04 (×3): qty 1

## 2015-06-04 MED ORDER — LIDOCAINE-PRILOCAINE 2.5-2.5 % EX CREA: 1.0000 "application " | TOPICAL_CREAM | Freq: Once | CUTANEOUS | Status: DC

## 2015-06-04 MED ORDER — IOHEXOL 300 MG/ML  SOLN
INTRAMUSCULAR | Status: DC | PRN
  Administered 2015-06-04: 30 mL via INTRAVENOUS
  Administered 2015-06-04: 70 mL via INTRAVENOUS
  Administered 2015-06-04: 100 mL via INTRAVENOUS

## 2015-06-04 MED ORDER — ASPIRIN 81 MG PO CHEW: 81.0000 mg | CHEWABLE_TABLET | ORAL | Status: DC

## 2015-06-04 MED ORDER — MIDAZOLAM HCL 2 MG/2ML IJ SOLN
INTRAMUSCULAR | Status: AC
  Filled 2015-06-04: qty 2

## 2015-06-04 MED ORDER — MIDAZOLAM HCL 2 MG/2ML IJ SOLN
INTRAMUSCULAR | Status: DC | PRN
  Administered 2015-06-04 (×3): 1 mg via INTRAVENOUS

## 2015-06-04 MED ORDER — ACETAMINOPHEN 325 MG PO TABS: 650.0000 mg | ORAL_TABLET | ORAL | Status: DC | PRN

## 2015-06-04 MED ORDER — NITROGLYCERIN 5 MG/ML IV SOLN
INTRAVENOUS | Status: AC
  Filled 2015-06-04: qty 10

## 2015-06-04 MED ORDER — LIDOCAINE HCL (PF) 1 % IJ SOLN
INTRAMUSCULAR | Status: DC | PRN
  Administered 2015-06-04: 15 mL

## 2015-06-04 MED ORDER — ASPIRIN 81 MG PO CHEW
CHEWABLE_TABLET | ORAL | Status: DC | PRN
  Administered 2015-06-04: 324 mg via ORAL

## 2015-06-04 MED ORDER — ASPIRIN 81 MG PO CHEW
CHEWABLE_TABLET | ORAL | Status: AC
  Filled 2015-06-04: qty 4

## 2015-06-04 MED ORDER — SODIUM CHLORIDE 0.9 % WEIGHT BASED INFUSION: 1.0000 mL/kg/h | INTRAVENOUS | Status: DC

## 2015-06-04 MED ORDER — SODIUM CHLORIDE 0.9 % WEIGHT BASED INFUSION: 3.0000 mL/kg/h | INTRAVENOUS | Status: DC

## 2015-06-04 MED ORDER — SODIUM CHLORIDE 0.9 % IV SOLN
INTRAVENOUS | Status: DC
  Administered 2015-06-04 (×2): via INTRAVENOUS

## 2015-06-04 MED ORDER — NITROGLYCERIN 0.4 MG SL SUBL: 0.4000 mg | SUBLINGUAL_TABLET | SUBLINGUAL | Status: DC | PRN

## 2015-06-04 MED ORDER — CLOPIDOGREL BISULFATE 75 MG PO TABS
ORAL_TABLET | ORAL | Status: DC | PRN
  Administered 2015-06-04: 600 mg via ORAL

## 2015-06-04 MED ORDER — SODIUM CHLORIDE 0.9 % IV SOLN: 250.0000 mL | INTRAVENOUS | Status: DC | PRN

## 2015-06-04 MED ORDER — FENTANYL CITRATE (PF) 100 MCG/2ML IJ SOLN
INTRAMUSCULAR | Status: DC | PRN
  Administered 2015-06-04 (×2): 25 ug via INTRAVENOUS
  Administered 2015-06-04 (×2): 50 ug via INTRAVENOUS

## 2015-06-04 MED ORDER — ADULT MULTIVITAMIN W/MINERALS CH
1.0000 | ORAL_TABLET | Freq: Every day | ORAL | Status: DC
  Administered 2015-06-04 – 2015-06-05 (×2): 1 via ORAL
  Filled 2015-06-04 (×2): qty 1

## 2015-06-04 MED ORDER — GLIPIZIDE 5 MG PO TABS
10.0000 mg | ORAL_TABLET | Freq: Every day | ORAL | Status: DC
  Administered 2015-06-04 – 2015-06-05 (×2): 10 mg via ORAL
  Filled 2015-06-04 (×2): qty 2

## 2015-06-04 MED ORDER — BIVALIRUDIN 250 MG IV SOLR
INTRAVENOUS | Status: AC
Start: 2015-06-04 — End: 2015-06-04
  Filled 2015-06-04: qty 250

## 2015-06-04 MED ORDER — CLOPIDOGREL BISULFATE 75 MG PO TABS
ORAL_TABLET | ORAL | Status: AC
  Filled 2015-06-04: qty 4

## 2015-06-04 MED ORDER — SODIUM CHLORIDE 0.9 % IJ SOLN: 3.0000 mL | INTRAMUSCULAR | Status: DC | PRN

## 2015-06-04 MED ORDER — SODIUM CHLORIDE 0.9 % IV SOLN
INTRAVENOUS | Status: AC
Start: 2015-06-04 — End: 2015-06-04

## 2015-06-04 MED ORDER — NITROGLYCERIN IN D5W 200-5 MCG/ML-% IV SOLN
INTRAVENOUS | Status: AC
  Filled 2015-06-04: qty 250

## 2015-06-04 MED ORDER — SODIUM CHLORIDE 0.9 % IJ SOLN: 3.0000 mL | Freq: Two times a day (BID) | INTRAMUSCULAR | Status: DC

## 2015-06-04 MED ORDER — HEPARIN (PORCINE) IN NACL 2-0.9 UNIT/ML-% IJ SOLN
INTRAMUSCULAR | Status: AC
  Filled 2015-06-04: qty 1000

## 2015-06-04 MED ORDER — SODIUM CHLORIDE 0.9 % IJ SOLN
3.0000 mL | Freq: Two times a day (BID) | INTRAMUSCULAR | Status: DC
  Administered 2015-06-04 – 2015-06-05 (×2): 3 mL via INTRAVENOUS

## 2015-06-04 SURGICAL SUPPLY — 19 items
BALLN TREK RX 2.5X15 (BALLOONS) ×2
BALLN ~~LOC~~ TREK RX 3.5X15 (BALLOONS) ×2
BALLOON TREK RX 2.5X15 (BALLOONS) ×1 IMPLANT
BALLOON ~~LOC~~ TREK RX 3.5X15 (BALLOONS) ×1 IMPLANT
CATH INFINITI 5FR ANG PIGTAIL (CATHETERS) ×2 IMPLANT
CATH INFINITI 5FR JL4 (CATHETERS) ×2 IMPLANT
CATH INFINITI 5FR JL5 (CATHETERS) ×2 IMPLANT
CATH INFINITI JR4 5F (CATHETERS) ×2 IMPLANT
CATH VISTA GUIDE 6FR XBLAD4 (CATHETERS) ×2 IMPLANT
DEVICE CLOSURE MYNXGRIP 6/7F (Vascular Products) ×2 IMPLANT
DEVICE INFLAT 30 PLUS (MISCELLANEOUS) ×2 IMPLANT
KIT MANI 3VAL PERCEP (MISCELLANEOUS) ×2 IMPLANT
NEEDLE PERC 18GX7CM (NEEDLE) ×2 IMPLANT
PACK CARDIAC CATH (CUSTOM PROCEDURE TRAY) ×2 IMPLANT
SHEATH AVANTI 6FR X 11CM (SHEATH) ×2 IMPLANT
SHEATH PINNACLE 5F 10CM (SHEATH) ×2 IMPLANT
STENT XIENCE ALPINE RX 3.0X18 (Permanent Stent) ×2 IMPLANT
WIRE EMERALD 3MM-J .035X150CM (WIRE) ×2 IMPLANT
WIRE INTUITION PROPEL ST 180CM (WIRE) ×2 IMPLANT

## 2015-06-04 NOTE — Progress Notes (Signed)
Patient admitted from specials after cardiac catheterization today around 11AM. No complaints of pain. Site is clear of hematoma and bruising. Vitals stable.

## 2015-06-04 NOTE — H&P (Signed)
[facility] Consultation Note  Patient ID: Louis Alexander, MRN: [id number], DOB/AGE: [birth year] 74 y.o. Admit date: 06/04/2015   Date of Consult: 06/04/2015 Primary Physician: Lelon Huh, MD Primary Cardiologist: Humphrey Rolls  Chief Complaint: No chief complaint on file.  Reason for Consult: acute non-ST elevation myocardial infarction  HPI: 74 y.o. male with recent acute non-ST elevation myocardial infarction for which the patient was placed on appropriate medication management in the hospital and then was discharged to home due to his wishes. He came back for outpatient evaluation and treatment of his acute myocardial infarction by cardiac catheterization  Past Medical History  Diagnosis Date  . Abnormal EKG   . Coronary artery disease   . Diabetes mellitus without complication   . Obesity   . Hyperlipidemia   . Hypertension   . 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 Sleep apnea     could not tolerate cpap mask  . Arthritis     oa  . Cancer     bladder and prostate  . History of radiation therapy 2012  . History of chemotherapy 2014  . Bladder cancer       Surgical History:  Past Surgical History  Procedure Laterality Date  . Cardiac catheterization  12-06-2003  . Lad stent  2000 x1 stent, 2008 x 1 stent, 2012 x 1 stent  . Ptca  2001  . Hernia repair  yrs ago  . Appendectomy  many yrs ago  . Rotator cuff repair Left yrs ago  . Seed implant to prostate with radiation  2012  . Protatectomy and urostomy  2014  . Total hip arthroplasty Right 12/27/2013    Procedure: RIGHT TOTAL HIP ARTHROPLASTY ANTERIOR APPROACH;  Surgeon: Mauri Pole, MD;  Location: [facility];  Service: Orthopedics;  Laterality: Right;  . Cardiac catheterization N/A 06/04/2015    Procedure: Left Heart Cath and Coronary Angiography;  Surgeon: Corey Skains, MD;  Location: [place] [facility];  Service: Cardiovascular;  Laterality: N/A;  . Cardiac catheterization N/A 06/04/2015   Procedure: Coronary Stent Intervention;  Surgeon: Wellington Hampshire, MD;  Location: [place] [facility];  Service: Cardiovascular;  Laterality: N/A;     Home Meds: Prior to Admission medications   Medication Sig Start Date End Date Taking? Authorizing Provider  aspirin EC 81 MG tablet Take 81 mg by mouth daily.   Yes Historical Provider, MD  atorvastatin (LIPITOR) 40 MG tablet Take 40 mg by mouth every evening.   Yes Historical Provider, MD  clopidogrel (PLAVIX) 75 MG tablet Take 75 mg by mouth daily with breakfast.   Yes Historical Provider, MD  Coenzyme Q10 10 MG capsule Take 10 mg by mouth.   Yes Historical Provider, MD  glipiZIDE (GLUCOTROL) 10 MG tablet Take 10 mg by mouth daily before breakfast.   Yes Historical Provider, MD  isosorbide mononitrate (IMDUR) 30 MG 24 hr tablet Take 1 tablet (30 mg total) by mouth daily. 06/02/15  Yes Dustin Flock, MD  megestrol (MEGACE ES) 625 MG/5ML suspension TAKE 5 MLS (625 MG TOTAL) BY MOUTH DAILY. 05/17/15  Yes Lloyd Huger, MD  metFORMIN (GLUCOPHAGE) 500 MG tablet Take 500 mg by mouth 2 (two) times daily with a meal.   Yes Historical Provider, MD  Multiple Vitamin (MULTIVITAMIN WITH MINERALS) TABS tablet Take 1 tablet by mouth daily.   Yes Historical Provider, MD  docusate sodium 100 MG CAPS Take 100 mg by mouth 2 (two) times daily.  Patient not taking: Reported on 06/04/2015 12/28/13   Danae Orleans, PA-C  fentaNYL (DURAGESIC - DOSED MCG/HR) 50 MCG/HR Place 50 mcg onto the skin every 3 (three) days.    Historical Provider, MD  lidocaine-prilocaine (EMLA) cream Apply 1 application topically once.    Historical Provider, MD  megestrol (MEGACE ES) 625 MG/5ML suspension TAKE 5 MLS (625 MG TOTAL) BY MOUTH DAILY. 05/17/15   Lloyd Huger, MD  nitroGLYCERIN (NITROSTAT) 0.4 MG SL tablet Place 1 tablet (0.4 mg total) under the tongue every 5 (five) minutes as needed for chest pain (Do not give more than 3 SL tablets in 15 minutes.). 06/02/15   Dustin Flock, MD    Inpatient Medications:  . aspirin EC  81 mg Oral Daily  . atorvastatin  40 mg Oral QPM  . clopidogrel  75 mg Oral Q breakfast  . docusate sodium  100 mg Oral BID  . fentaNYL  50 mcg Transdermal Q72H  . glipiZIDE  10 mg Oral QAC breakfast  . isosorbide mononitrate  30 mg Oral Daily  . lidocaine-prilocaine  1 application Topical Once  . multivitamin with minerals  1 tablet Oral Daily  . sodium chloride  3 mL Intravenous Q12H   . sodium chloride 100 mL/hr at 06/04/15 9562    Allergies:  Allergies  Allergen Reactions  . Celebrex [Celecoxib]     Other reaction(s): Bleeding Bleeding in bladder  . Effient [Prasugrel] Other (See Comments)    Other reaction(s): Bleeding Bleeding in bladder    History   Social History  . Marital Status: Married    Spouse Name: N/A  . Number of Children: N/A  . Years of Education: N/A   Occupational History  . Not on file.   Social History Main Topics  . Smoking status: Never Smoker   . Smokeless tobacco: Never Used  . Alcohol Use: No     Comment: occasional  . Drug Use: No  . Sexual Activity: Not on file   Other Topics Concern  . Not on file   Social History Narrative     Family History  Problem Relation Age of Onset  . Stroke Father      Review of Systems Positive for shortness of breath chest pain Negative for: General:  chills, fever, night sweats or weight changes.  Cardiovascular: PND orthopnea syncope dizziness  Dermatological skin lesions rashes Respiratory: Cough congestion Urologic: Frequent urination urination at night and hematuria Abdominal: negative for nausea, vomiting, diarrhea, bright red blood per rectum, melena, or hematemesis Neurologic: negative for visual changes, and/or hearing changes  All other systems reviewed and are otherwise negative except as noted above.  Labs: No results for input(s): CKTOTAL, CKMB, TROPONINI in the last 72 hours. Lab Results  Component Value Date   WBC 11.7*  06/02/2015   HGB 10.5* 06/02/2015   HCT 32.9* 06/02/2015   MCV 90.6 06/02/2015   PLT 255 06/02/2015    Recent Labs Lab 06/01/15 0141  NA 134*  K 4.2  CL 105  CO2 20*  BUN 22*  CREATININE 0.93  CALCIUM 8.7*  PROT 5.8*  BILITOT 0.3  ALKPHOS 82  ALT 14*  AST 18  GLUCOSE 316*   No results found for: CHOL, HDL, LDLCALC, TRIG No results found for: DDIMER  Radiology/Studies:  Dg Chest Port 1 View  06/01/2015   CLINICAL DATA:  Left-sided chest pain starting about 2 hours ago. Shortness of breath and tingling in the left arm and hand.  EXAM: PORTABLE  CHEST - 1 VIEW  COMPARISON:  12/21/2013  FINDINGS: Right power port type central venous catheter with tip over the lower SVC. No pneumothorax. Metallic foreign body again demonstrated over the left chest. No change since prior study. The heart size and mediastinal contours are within normal limits. Both lungs are clear. The visualized skeletal structures are unremarkable.  IMPRESSION: No active disease.   Electronically Signed   By: Lucienne Capers M.D.   On: 06/01/2015 02:01    EKG: Normal sinus rhythm with normal EKG  Weights: Filed Weights   06/04/15 0636 06/04/15 1129  Weight: 190 lb (86.183 kg) 183 lb 4.8 oz (83.144 kg)     Physical Exam: Blood pressure 156/89, pulse 65, temperature 98 F (36.7 C), temperature source Oral, resp. rate 20, height 5\' 11"  (1.803 m), weight 183 lb 4.8 oz (83.144 kg), SpO2 99 %. Body mass index is 25.58 kg/(m^2). General: Well developed, well nourished, in no acute distress. Head eyes ears nose throat: Normocephalic, atraumatic, sclera non-icteric, no xanthomas, nares are without discharge. No apparent thyromegaly and/or mass  Lungs: Normal respiratory effort.  no wheezes, no rales, no rhonchi.  Heart: RRR with normal S1 S2. no murmur gallop, no rub, PMI is normal size and placement, carotid upstroke normal without bruit, jugular venous pressure is normal Abdomen: Soft, non-tender, non-distended  with normoactive bowel sounds. No hepatomegaly. No rebound/guarding. No obvious abdominal masses. Abdominal aorta is normal size without bruit Extremities: No edema. no cyanosis, no clubbing, no ulcers  Peripheral : 2+ bilateral upper extremity pulses, 2+ bilateral femoral pulses, 2+ bilateral dorsal pedal pulse Neuro: Alert and oriented. No facial asymmetry. No focal deficit. Moves all extremities spontaneously. Musculoskeletal: Normal muscle tone without kyphosis Psych:  Responds to questions appropriately with a normal affect.    Assessment: 74 year old male with non-ST elevation myocardial infarction  Plan: 1. Outpatient cardiac catheterization to assess coronary anatomy and further treatment thereof is necessary. Patient understands the risk and benefits of cardiac catheterization. This includes possibility does stroke heart attack infection bleeding or blood clot. He is at low risk for conscious sedation  Signed, Corey Skains M.D. [facility] 06/04/2015, 6:57 PM

## 2015-06-04 NOTE — Discharge Summary (Signed)
[facility] Cardiology Discharge Summary  Patient ID: Louis Alexander MRN: [id number] DOB/AGE: [birth year] 74 y.o.  Admit date: 06/04/2015 Discharge date: 06/04/2015  Primary Discharge Diagnosis: Ischemic chest pain I20.9 and Coronary artery disease with angina I25.119 Secondary Discharge Diagnosis diabetes, high blood pressure and high cholesterol  Significant Diagnostic Studies: Cardiac cath with left ventricular angiogram and selective coronary injection as well as PCI and stent placement of left anterior descending.  Hospital Course: The patient was admitted to specials for cardiac cath with selective coronary angiogram after full consent, risk and benefits explained, and time out called with all approprate details voiced and discussed. The patient has had progressive canadian class 4 angina without stress test but di have nstemi risk stress test consistent with ischemic chest pain and or anginal equivalent with coronary artery risk factors including diabetes, high blood pressure and high cholesterol. The procedure was performed without complication and it revealed normal left ventricular function with ejection fraction of 50%.  It was found that the patient had severe 1 vessel coronary atherosclerosis with significant left anterior descending stenosis requiring further intervention. Therefore, the patient had a PCI and drug eluding stent placed without complication. The patient has been ambulating without further significant symptoms and has reached his maximal hospital benefit and will be discharged to home in good condition.  Cardiac rehabilitation has been discussed and recommended. Medication management of cardiovascular risk factors will be given post discharge and modified as an outpatient.   Discharge Exam: Blood pressure 156/89, pulse 65, temperature 98 F (36.7 C), temperature source Oral, resp. rate 20, height 5\' 11"  (1.803 m), weight 183 lb 4.8 oz (83.144 kg), SpO2 99 %.   Constitutional: Alet oriented to person, place, and time. No distress.  HENT: No nasal discharge.  Head: Normocephalic and atraumatic.  Eyes: Pupils are equal and round. No discharge.  Neck: Normal range of motion. Neck supple. No JVD present. No thyromegaly present.  Cardiovascular: Normal rate, regular rhythm, normal S1 S2, no gallop, no friction rub. No murmur Pulmonary/Chest: Effort normal, No stridor. No respiratory distress. no wheezes.  no rales.    Abdominal: Soft. Bowel sounds are normal.  no distension.  no tenderness. There is no rebound and no guarding.  Musculoskeletal: No edema, no cyanosis, normal pulses, no bleeding, Normal range of motion. no tenderness.  Neurological:  alert and oriented to person, place, and time. Coordination normal.  Skin: Skin is warm and dry. No rash noted. No erythema. No pallor.  Psychiatric:  normal mood and affect. behavior is normal.    Labs:   Lab Results  Component Value Date   WBC 11.7* 06/02/2015   HGB 10.5* 06/02/2015   HCT 32.9* 06/02/2015   MCV 90.6 06/02/2015   PLT 255 06/02/2015    Recent Labs Lab 06/01/15 0141  NA 134*  K 4.2  CL 105  CO2 20*  BUN 22*  CREATININE 0.93  CALCIUM 8.7*  PROT 5.8*  BILITOT 0.3  ALKPHOS 82  ALT 14*  AST 18  GLUCOSE 316*    EKG: NSR without evidence of new changes  FOLLOW UP IN ONE TO TWO WEEKS    Medication List    STOP taking these medications        fentaNYL 50 MCG/HR  Commonly known as:  DURAGESIC - dosed mcg/hr     isosorbide mononitrate 30 MG 24 hr tablet  Commonly known as:  IMDUR     nitroGLYCERIN 0.4 MG SL tablet  Commonly known as:  NITROSTAT      TAKE these medications        aspirin EC 81 MG tablet  Take 81 mg by mouth daily.     atorvastatin 40 MG tablet  Commonly known as:  LIPITOR  Take 40 mg by mouth every evening.     clopidogrel 75 MG tablet  Commonly known as:  PLAVIX  Take 75 mg by mouth daily with breakfast.     Coenzyme Q10 10 MG capsule   Take 10 mg by mouth.     DSS 100 MG Caps  Take 100 mg by mouth 2 (two) times daily.     glipiZIDE 10 MG tablet  Commonly known as:  GLUCOTROL  Take 10 mg by mouth daily before breakfast.     lidocaine-prilocaine cream  Commonly known as:  EMLA  Apply 1 application topically once.     megestrol 625 MG/5ML suspension  Commonly known as:  MEGACE ES  TAKE 5 MLS (625 MG TOTAL) BY MOUTH DAILY.     megestrol 625 MG/5ML suspension  Commonly known as:  MEGACE ES  TAKE 5 MLS (625 MG TOTAL) BY MOUTH DAILY.     metFORMIN 500 MG tablet  Commonly known as:  GLUCOPHAGE  Take 500 mg by mouth 2 (two) times daily with a meal.     multivitamin with minerals Tabs tablet  Take 1 tablet by mouth daily.         THE PATIENT  SHALL BRING ALL MEDICATIONS TO FOLLOW UP APPOINTMENT  Signed:  Corey Skains MD, [facility] 06/04/2015, 6:55 PM

## 2015-06-05 DIAGNOSIS — I25119 Atherosclerotic heart disease of native coronary artery with unspecified angina pectoris: Secondary | ICD-10-CM | POA: Diagnosis not present

## 2015-06-05 LAB — CBC
HCT: 29.6 % — ABNORMAL LOW (ref 40.0–52.0)
Hemoglobin: 9.9 g/dL — ABNORMAL LOW (ref 13.0–18.0)
MCH: 30.3 pg (ref 26.0–34.0)
MCHC: 33.4 g/dL (ref 32.0–36.0)
MCV: 90.8 fL (ref 80.0–100.0)
PLATELETS: 209 10*3/uL (ref 150–440)
RBC: 3.26 MIL/uL — ABNORMAL LOW (ref 4.40–5.90)
RDW: 20.1 % — ABNORMAL HIGH (ref 11.5–14.5)
WBC: 10 10*3/uL (ref 3.8–10.6)

## 2015-06-05 LAB — BASIC METABOLIC PANEL
Anion gap: 7 (ref 5–15)
BUN: 23 mg/dL — ABNORMAL HIGH (ref 6–20)
CHLORIDE: 109 mmol/L (ref 101–111)
CO2: 21 mmol/L — AB (ref 22–32)
Calcium: 8.5 mg/dL — ABNORMAL LOW (ref 8.9–10.3)
Creatinine, Ser: 0.9 mg/dL (ref 0.61–1.24)
GFR calc Af Amer: >60 mL/min (ref >60)
GFR calc non Af Amer: >60 mL/min (ref >60)
Glucose, Bld: 113 mg/dL — ABNORMAL HIGH (ref 65–99)
POTASSIUM: 4.1 mmol/L (ref 3.5–5.1)
Sodium: 137 mmol/L (ref 135–145)

## 2015-06-05 MED ORDER — HEPARIN SOD (PORK) LOCK FLUSH 10 UNIT/ML IV SOLN: 10.0000 [IU] | Freq: Once | INTRAVENOUS | Status: DC

## 2015-06-05 MED ORDER — HEPARIN SOD (PORK) LOCK FLUSH 100 UNIT/ML IV SOLN
500.0000 [IU] | Freq: Once | INTRAVENOUS | Status: AC
  Administered 2015-06-05: 500 [IU] via INTRAVENOUS
  Filled 2015-06-05: qty 5

## 2015-06-05 NOTE — Progress Notes (Signed)
Discharge instructions given. No new medications. Vascular access site management instructions given along with MI and chest pain education. Port de-accessed, flushed with heparin lock flush first. Patient has no further questions. Telemetry box removed.

## 2015-06-05 NOTE — Care Management (Signed)
Patient was recently discharge 7/16 with nstemi.  It is reported he chose to pursue the planned cardiac cath as an outpatient so discharged home and returned.  Did require PCI.  He is discharging home on plavix which is not cost prohibitive.  No discharge needs

## 2015-06-06 ENCOUNTER — Telehealth: Payer: Self-pay | Admitting: Family Medicine

## 2015-06-06 NOTE — Telephone Encounter (Signed)
OK to call in verbal order for Urine Night Bag.

## 2015-06-06 NOTE — Telephone Encounter (Signed)
Pt has requested Urine Night Bag .  Please contact patient or the company if you can give him an order for one.  Medical company's number is  [phone]  Thanks [name]

## 2015-06-07 NOTE — Telephone Encounter (Signed)
Order phoned in.

## 2015-06-11 DIAGNOSIS — N342 Other urethritis: Secondary | ICD-10-CM | POA: Insufficient documentation

## 2015-06-11 DIAGNOSIS — Z9889 Other specified postprocedural states: Secondary | ICD-10-CM | POA: Insufficient documentation

## 2015-06-11 DIAGNOSIS — C679 Malignant neoplasm of bladder, unspecified: Secondary | ICD-10-CM | POA: Insufficient documentation

## 2015-06-11 DIAGNOSIS — M169 Osteoarthritis of hip, unspecified: Secondary | ICD-10-CM | POA: Insufficient documentation

## 2015-06-11 DIAGNOSIS — I252 Old myocardial infarction: Secondary | ICD-10-CM | POA: Insufficient documentation

## 2015-06-11 DIAGNOSIS — M25559 Pain in unspecified hip: Secondary | ICD-10-CM | POA: Insufficient documentation

## 2015-06-11 DIAGNOSIS — E785 Hyperlipidemia, unspecified: Secondary | ICD-10-CM | POA: Insufficient documentation

## 2015-06-11 DIAGNOSIS — R609 Edema, unspecified: Secondary | ICD-10-CM | POA: Insufficient documentation

## 2015-06-12 ENCOUNTER — Ambulatory Visit (INDEPENDENT_AMBULATORY_CARE_PROVIDER_SITE_OTHER): Payer: PPO | Admitting: Family Medicine

## 2015-06-12 ENCOUNTER — Encounter: Payer: Self-pay | Admitting: Family Medicine

## 2015-06-12 VITALS — BP 104/64 | HR 71 | Temp 98.5°F | Resp 16 | Ht 71.0 in | Wt 190.0 lb

## 2015-06-12 DIAGNOSIS — I25119 Atherosclerotic heart disease of native coronary artery with unspecified angina pectoris: Secondary | ICD-10-CM

## 2015-06-12 DIAGNOSIS — E1129 Type 2 diabetes mellitus with other diabetic kidney complication: Secondary | ICD-10-CM | POA: Diagnosis not present

## 2015-06-12 LAB — POCT GLYCOSYLATED HEMOGLOBIN (HGB A1C)
Est. average glucose Bld gHb Est-mCnc: 174
HEMOGLOBIN A1C: 7.7

## 2015-06-12 NOTE — Progress Notes (Signed)
Patient: Louis Alexander Male    DOB: [birth year]   74 y.o.   MRN: [id number] Visit Date: 06/12/2015  Today's Provider: Lelon Huh, MD   Chief Complaint  Patient presents with  . Follow-up    5 months  . Hypertension  . Hyperlipidemia  . Diabetes   Subjective:    HPI   Diabetes Mellitus Type II, Follow-up:   Lab Results  Component Value Date   HGBA1C 7.2* 12/22/2014   Last seen for diabetes 5 months ago.  Management changes included none. He reports good compliance with treatment. He is not having side effects. none Home blood sugar records: fasting range: 130  Episodes of hypoglycemia? no   Current diet: well balanced Current exercise: walking  ------------------------------------------------------------------------   Hypertension, follow-up:  BP Readings from Last 3 Encounters:  06/12/15 104/64  06/05/15 138/75  06/02/15 114/74    He was last seen for hypertension 5 months ago.  BP at that visit was 120/64. Management changes since that visit include none .He reports good compliance with treatment. He is not having side effects. none  He is exercising. He is adherent to low salt diet.   Outside blood pressures are N/A. He is experiencing none.  Patient denies none.   ------------------------------------------------------------------------    Lipid/Cholesterol, Follow-up:   Last seen for this 5 months ago.  Management changes since that visit include none.  Last Lipid Panel:    Component Value Date/Time   CHOL 189 12/22/2014   CHOL 134 10/26/2012 0131   TRIG 165* 12/22/2014   TRIG 284* 10/26/2012 0131   HDL 41 12/22/2014   HDL 36* 10/26/2012 0131   VLDL 57* 10/26/2012 0131   LDLCALC 115 12/22/2014   LDLCALC 41 10/26/2012 0131    He reports good compliance with treatment. He is not having side effects. none  Wt Readings from Last 3 Encounters:  06/12/15 190 lb (86.183 kg)  06/04/15 183 lb 4.8 oz (83.144 kg)  06/01/15  191 lb 4.8 oz (86.773 kg)    ------------------------------------------------------------------------  Coronary artery disease, follow up Patient was admitted to hospital 06/01/2015 for chest pain. Patient had cardiac catheter on 06/04/2015 and had drug eluding stent placed in his LAD. He has had no chest pain since discharge and strength is coming back. No DOE. He was discharged 06/05/2015.  He has follow up scheduled with Dr. Humphrey Rolls August 14th.     Allergies  Allergen Reactions  . Celebrex [Celecoxib]     Hematuria Other reaction(s): Bleeding Bleeding in bladder  . Effient [Prasugrel] Other (See Comments)    blood clots, blood in urine Other reaction(s): Bleeding Bleeding in bladder   Previous Medications   ASPIRIN EC 81 MG TABLET    Take 81 mg by mouth daily.   ATENOLOL (TENORMIN) 25 MG TABLET    Take 1 tablet by mouth daily.   ATORVASTATIN (LIPITOR) 40 MG TABLET    Take 40 mg by mouth every evening.   CANAGLIFLOZIN (INVOKANA) 100 MG TABS TABLET    Take 1 tablet by mouth every morning.   CLOPIDOGREL (PLAVIX) 75 MG TABLET    Take 75 mg by mouth daily with breakfast.   COENZYME Q10 10 MG CAPSULE    Take 10 mg by mouth.   DOCUSATE SODIUM 100 MG CAPS    Take 100 mg by mouth 2 (two) times daily.   GLIPIZIDE (GLUCOTROL) 10 MG TABLET    Take 10 mg by mouth daily before breakfast.  LOSARTAN (COZAAR) 100 MG TABLET    Take 1 tablet by mouth daily.   METFORMIN (GLUCOPHAGE) 500 MG TABLET    Take 500 mg by mouth 2 (two) times daily with a meal.   METFORMIN (GLUCOPHAGE-XR) 500 MG 24 HR TABLET    Take 2 tablets by mouth 2 (two) times daily.   MULTIPLE VITAMIN (MULTIVITAMIN WITH MINERALS) TABS TABLET    Take 1 tablet by mouth daily.   NAPROXEN SODIUM (ALEVE) 220 MG TABLET    Take 1 tablet by mouth daily as needed.    Review of Systems  Constitutional: Negative for fever, chills and fatigue.  Respiratory: Positive for shortness of breath. Negative for chest tightness and wheezing.     Cardiovascular: Positive for chest pain and leg swelling. Negative for palpitations.  Neurological: Negative for dizziness, light-headedness and headaches.    History  Substance Use Topics  . Smoking status: Never Smoker   . Smokeless tobacco: Never Used  . Alcohol Use: No     Comment: occasional   Objective:   BP 104/64 mmHg  Pulse 71  Temp(Src) 98.5 F (36.9 C) (Oral)  Resp 16  Ht 5\' 11"  (1.803 m)  Wt 190 lb (86.183 kg)  BMI 26.51 kg/m2  SpO2 98%  Physical Exam  General Appearance:    Alert, cooperative, no distress  Eyes:    PERRL, conjunctiva/corneas clear, EOM's intact       Lungs:     Clear to auscultation bilaterally, respirations unlabored  Heart:    Regular rate and rhythm, 1+ bipedal edema.   Neurologic:   Awake, alert, oriented x 3. No apparent focal neurological           defect.        Results for orders placed or performed in visit on 06/12/15  POCT glycosylated hemoglobin (Hb A1C)  Result Value Ref Range   Hemoglobin A1C 7.7    Est. average glucose Bld gHb Est-mCnc 174        Assessment & Plan:     1. Type 2 diabetes mellitus with other diabetic kidney complication H2C is up a bit today, he is getting back on track with his diet.  - POCT glycosylated hemoglobin (Hb A1C)  2. Coronary artery disease involving native coronary artery of native heart with angina pectoris Doing well since stenting of LAD last week. Follow of cardiology as scheduled.   Return in about 4 months (around 10/13/2015).       Lelon Huh, MD  [facility]

## 2015-06-13 ENCOUNTER — Ambulatory Visit
Admission: RE | Admit: 2015-06-13 | Discharge: 2015-06-13 | Disposition: A | Discharge status: Home / Self Care | Payer: PPO | Source: Ambulatory Visit | Attending: Family Medicine | Admitting: Family Medicine

## 2015-06-13 DIAGNOSIS — C689 Malignant neoplasm of urinary organ, unspecified: Secondary | ICD-10-CM

## 2015-06-14 ENCOUNTER — Encounter
Admission: RE | Admit: 2015-06-14 | Discharge: 2015-06-14 | Disposition: A | Discharge status: Home / Self Care | Payer: PPO | Source: Ambulatory Visit | Attending: Family Medicine | Admitting: Family Medicine

## 2015-06-14 DIAGNOSIS — C688 Malignant neoplasm of overlapping sites of urinary organs: Secondary | ICD-10-CM | POA: Diagnosis present

## 2015-06-14 LAB — GLUCOSE, CAPILLARY: Glucose-Capillary: 94 mg/dL (ref 65–99)

## 2015-06-14 MED ORDER — FLUDEOXYGLUCOSE F - 18 (FDG) INJECTION
12.6700 | Freq: Once | INTRAVENOUS | Status: AC | PRN
  Administered 2015-06-14: 12.67 via INTRAVENOUS

## 2015-06-15 NOTE — Progress Notes (Signed)
[place]  [phone]) [phone] Fax:(336) [phone]  ID: Louis Alexander OB: 1941/08/30  MR#: [id number]  DDU#:[id number]  Patient Care Team: Birdie Sons, MD as PCP - General (Family Medicine) Murrell Redden, MD (Urology) Dionisio David, MD as Consulting Physician (Cardiology) Lloyd Huger, MD as Consulting Physician (Oncology)  CHIEF COMPLAINT:  Chief Complaint  Patient presents with  . Follow-up  . Cancer  . Chemotherapy    INTERVAL HISTORY: Patient returns to clinic as an add-on with increasing fatigue and declining performance status since completing his chemotherapy approximately one month ago. Patient states he has a good appetite and is drinking fluids as much as he can, but he still feels dehydrated. He admits he feels improved after receiving IV fluids yesterday. He has no neurologic complaints. He denies any recent fevers. He denies any chest pain or shortness of breath. He denies any nausea, vomiting, constipation, or diarrhea. He has no urinary complaints. Patient offers no further specific complaints today.   REVIEW OF SYSTEMS:   Review of Systems  Constitutional: Positive for malaise/fatigue.  Respiratory: Negative.   Cardiovascular: Negative.   Musculoskeletal: Negative.        Pelvic pain, resolved  Neurological: Positive for weakness.    As per HPI. Otherwise, a complete review of systems is negatve.  PAST MEDICAL HISTORY: Past Medical History  Diagnosis Date  . Abnormal EKG   . Coronary artery disease   . Diabetes mellitus without complication   . Obesity   . Hyperlipidemia   . Hypertension   . 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 Sleep apnea     could not tolerate cpap mask  . Arthritis     oa  . Cancer     bladder and prostate  . History of radiation therapy 2012  . History of chemotherapy 2014  . Bladder cancer   . History of chicken pox     PAST SURGICAL HISTORY: Past Surgical History    Procedure Laterality Date  . Cardiac catheterization  12-06-2003  . Lad stent  2000 x1 stent, 2008 x 1 stent, 2012 x 1 stent  . Hernia repair  yrs ago  . Appendectomy  many yrs ago  . Rotator cuff repair Left yrs ago  . Seed implant to prostate with radiation  2012  . Protatectomy and urostomy  2014  . Total hip arthroplasty Right 12/27/2013    Procedure: RIGHT TOTAL HIP ARTHROPLASTY ANTERIOR APPROACH;  Surgeon: Mauri Pole, MD;  Location: [facility];  Service: Orthopedics;  Laterality: Right;  . Cardiac catheterization N/A 06/04/2015    Procedure: Left Heart Cath and Coronary Angiography;  Surgeon: Corey Skains, MD;  Location: [place] [facility];  Service: Cardiovascular;  Laterality: N/A;  . Cardiac catheterization N/A 06/04/2015    Procedure: Coronary Stent Intervention;  Surgeon: Wellington Hampshire, MD;  Location: [place] [facility];  Service: Cardiovascular;  Laterality: N/A;    FAMILY HISTORY: Reviewed and unchanged. No report of malignancy or chronic disease.     ADVANCED DIRECTIVES:    HEALTH MAINTENANCE: History  Substance Use Topics  . Smoking status: Never Smoker   . Smokeless tobacco: Never Used  . Alcohol Use: No     Comment: occasional     Colonoscopy:  PAP:  Bone density:  Lipid panel:  Allergies  Allergen Reactions  . Celebrex [Celecoxib]     Hematuria Other reaction(s): Bleeding Bleeding in bladder  . Effient [  Prasugrel] Other (See Comments)    blood clots, blood in urine Other reaction(s): Bleeding Bleeding in bladder    Current Outpatient Prescriptions  Medication Sig Dispense Refill  . aspirin EC 81 MG tablet Take 81 mg by mouth daily.    [name] atenolol (TENORMIN) 25 MG tablet Take 1 tablet by mouth daily.    [name] atorvastatin (LIPITOR) 40 MG tablet Take 40 mg by mouth every evening.    . canagliflozin (INVOKANA) 100 MG TABS tablet Take 1 tablet by mouth every morning.    . clopidogrel (PLAVIX) 75 MG tablet Take 75 mg by mouth daily with  breakfast.    . Coenzyme Q10 10 MG capsule Take 10 mg by mouth.    . docusate sodium 100 MG CAPS Take 100 mg by mouth 2 (two) times daily. 10 capsule 0  . glipiZIDE (GLUCOTROL) 10 MG tablet Take 10 mg by mouth daily before breakfast.    . losartan (COZAAR) 100 MG tablet Take 1 tablet by mouth daily.    . metFORMIN (GLUCOPHAGE) 500 MG tablet Take 500 mg by mouth 2 (two) times daily with a meal.    . metFORMIN (GLUCOPHAGE-XR) 500 MG 24 hr tablet Take 2 tablets by mouth 2 (two) times daily.    . Multiple Vitamin (MULTIVITAMIN WITH MINERALS) TABS tablet Take 1 tablet by mouth daily.    . naproxen sodium (ALEVE) 220 MG tablet Take 1 tablet by mouth daily as needed.     No current facility-administered medications for this visit.   Facility-Administered Medications Ordered in Other Visits  Medication Dose Route Frequency Provider Last Rate Last Dose  . chlorhexidine (HIBICLENS) 4 % liquid 4 application  60 mL Topical Once Danae Orleans, PA-C       And  . chlorhexidine (HIBICLENS) 4 % liquid 4 application  60 mL Topical Once Danae Orleans, PA-C      . sodium chloride 0.9 % injection 10 mL  10 mL Intracatheter PRN Lloyd Huger, MD   10 mL at 05/09/15 0900  . sodium chloride 0.9 % injection 3 mL  3 mL Intravenous PRN Lloyd Huger, MD        OBJECTIVE: Filed Vitals:   05/31/15 0909  BP: 133/80  Pulse: 73  Temp: 97.8 F (36.6 C)     Body mass index is 27.62 kg/(m^2).    ECOG FS:1 - Symptomatic but completely ambulatory  General: Well-developed, well-nourished, no acute distress. Eyes: anicteric sclera. Lungs: Clear to auscultation bilaterally. Heart: Regular rate and rhythm. No rubs, murmurs, or gallops. Abdomen: Soft, nontender, nondistended. No organomegaly noted, normoactive bowel sounds. Musculoskeletal: No edema, cyanosis, or clubbing. Neuro: Alert, answering all questions appropriately. Cranial nerves grossly intact. Skin: No rashes or petechiae noted. Psych: Normal  affect.    LAB RESULTS:  Lab Results  Component Value Date   NA 137 06/05/2015   K 4.1 06/05/2015   CL 109 06/05/2015   CO2 21* 06/05/2015   GLUCOSE 113* 06/05/2015   BUN 23* 06/05/2015   CREATININE 0.90 06/05/2015   CALCIUM 8.5* 06/05/2015   PROT 5.8* 06/01/2015   ALBUMIN 3.1* 06/01/2015   AST 18 06/01/2015   ALT 14* 06/01/2015   ALKPHOS 82 06/01/2015   BILITOT 0.3 06/01/2015   GFRNONAA >60 06/05/2015   GFRAA >60 06/05/2015    Lab Results  Component Value Date   WBC 10.0 06/05/2015   NEUTROABS 8.7* 06/01/2015   HGB 9.9* 06/05/2015   HCT 29.6* 06/05/2015   MCV 90.8 06/05/2015  PLT 209 06/05/2015     STUDIES: Nm Pet Image Restag (ps) Skull Base To Thigh  06/14/2015   CLINICAL DATA:  Subsequent treatment strategy for stage IV urothelial carcinoma.  EXAM: NUCLEAR MEDICINE PET SKULL BASE TO THIGH  TECHNIQUE: 12.7 mCi F-18 FDG was injected intravenously. Full-ring PET imaging was performed from the skull base to thigh after the radiotracer. CT data was obtained and used for attenuation correction and anatomic localization.  FASTING BLOOD GLUCOSE:  Value: 94 mg/dl  COMPARISON:  Multiple exams, including 02/07/2015  FINDINGS: NECK  No hypermetabolic lymph nodes in the neck.  CHEST  Lymph node below the bronchus intermedius measures 8 mm in short axis on image 98 series 3 (formerly 1.1 cm) and has maximum standard uptake value 8.2 (formerly 8.9).  Prior hypermetabolic right apical nodule is no longer present.  Prior pleural-based hypermetabolic lesion adjacent to the right major fissure is no longer present.  Mild lingular atelectasis. Cardiomegaly with coronary artery atherosclerosis.  Small metal density in the left lung, stable.  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  ABDOMEN/PELVIS  Hypermetabolic liver masses appear subjectively similar to prior. Index mass in the dome of the right hepatic lobe measures approximately 3.6 by 2.5 cm on image 124 series 3 it has maximum  standard uptake value 13.2 (formerly 83.3)  Hypermetabolic bilateral inguinal adenopathy noted, reduced in size and activity compared to the prior exam. Index left inguinal lymph node measures 1.5 cm in short axis on image 252 of series 3 (formerly 2.3 cm) and has maximum standard uptake value of 13.1 (formerly 17.6).  Physiologic activity in the bowel. Diffuse abnormal penile uptake noted, maximum standard uptake value 19.9, previously 25.3. Aortoiliac atherosclerotic vascular disease.  Right ilial conduit with herniation of omental adipose tissue. Pelvic floor laxity. Right hip prosthesis.  SKELETON  Reduced activity of the osseous metastatic disease. Specifically the large right sacroiliac mass demonstrates considerable internal necrosis with maximum standard uptake value 8.3 (formerly 24.1), and a small index right posterior acetabular column lesion has a maximum standard uptake value of 2.8 (formerly 7.0).  IMPRESSION: 1. Although there is a considerable burden of remaining metastatic disease, there has been some improvement. Specifically, lesions generally measure at significantly reduced metabolic activity compared to prior, and the right pulmonary nodule and right pleural-based nodule have resolved. 2. Other imaging findings of potential clinical significance: Cardiomegaly with coronary artery atherosclerosis. 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 Aortoiliac atherosclerotic vascular disease. Pelvic floor laxity. Right ilial conduit with herniation of omental adipose tissue.   Electronically Signed   By: Van Clines M.D.   On: 06/14/2015 12:57   Dg Chest Port 1 View  06/01/2015   CLINICAL DATA:  Left-sided chest pain starting about 2 hours ago. Shortness of breath and tingling in the left arm and hand.  EXAM: PORTABLE CHEST - 1 VIEW  COMPARISON:  12/21/2013  FINDINGS: Right power port type central venous catheter with tip over the lower SVC. No pneumothorax. Metallic foreign body again  demonstrated over the left chest. No change since prior study. The heart size and mediastinal contours are within normal limits. Both lungs are clear. The visualized skeletal structures are unremarkable.  IMPRESSION: No active disease.   Electronically Signed   By: Lucienne Capers M.D.   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  PLAN:    1.  Urothelial carcinoma: PET scan results reviewed independently and reported as above reveals persistent disease, but  significant improvement in disease burden. Patient has been instructed to keep his previously scheduled follow-up appointment to discuss the results and further treatment planning if he desires.  If patient progresses within one year, continue consider newly approved atezolizumab.   2. Urethral pain: Resolved. Secondary to malignancy, patient has now completed XRT. Patient states he is now off his narcotics. 3. Hyperglycemia: Patient is diabetic. Continue glipizide and metformin as prescribed. Monitor. 4. Anemia: Secondary to chemotherapy, monitor. 5. Weakness and fatigue: Patient will receive an additional 2 L of IV fluids today.   Patient expressed understanding and was in agreement with this plan. He also understands that He can call clinic at any time with any questions, concerns, or complaints.   Urothelial cancer   Staging form: Kidney, AJCC 7th Edition     Clinical stage from 03/16/2015: Stage IV (TX, N1, M1) - Signed by Lloyd Huger, MD on 03/16/2015   Lloyd Huger, MD   06/15/2015 6:14 PM

## 2015-06-18 ENCOUNTER — Inpatient Hospital Stay: Payer: PPO | Attending: Oncology | Admitting: Oncology

## 2015-06-18 VITALS — BP 114/71 | HR 106 | Temp 97.6°F | Ht 71.0 in | Wt 213.8 lb

## 2015-06-18 DIAGNOSIS — D6481 Anemia due to antineoplastic chemotherapy: Secondary | ICD-10-CM

## 2015-06-18 DIAGNOSIS — C679 Malignant neoplasm of bladder, unspecified: Secondary | ICD-10-CM

## 2015-06-18 DIAGNOSIS — E669 Obesity, unspecified: Secondary | ICD-10-CM

## 2015-06-18 DIAGNOSIS — Z79899 Other long term (current) drug therapy: Secondary | ICD-10-CM | POA: Insufficient documentation

## 2015-06-18 DIAGNOSIS — C787 Secondary malignant neoplasm of liver and intrahepatic bile duct: Secondary | ICD-10-CM

## 2015-06-18 DIAGNOSIS — G4730 Sleep apnea, unspecified: Secondary | ICD-10-CM | POA: Diagnosis not present

## 2015-06-18 DIAGNOSIS — I252 Old myocardial infarction: Secondary | ICD-10-CM | POA: Diagnosis not present

## 2015-06-18 DIAGNOSIS — I712 Thoracic aortic aneurysm, without rupture: Secondary | ICD-10-CM | POA: Insufficient documentation

## 2015-06-18 DIAGNOSIS — Z7982 Long term (current) use of aspirin: Secondary | ICD-10-CM | POA: Insufficient documentation

## 2015-06-18 DIAGNOSIS — I2510 Atherosclerotic heart disease of native coronary artery without angina pectoris: Secondary | ICD-10-CM | POA: Insufficient documentation

## 2015-06-18 DIAGNOSIS — Z923 Personal history of irradiation: Secondary | ICD-10-CM

## 2015-06-18 DIAGNOSIS — C7910 Secondary malignant neoplasm of unspecified urinary organs: Secondary | ICD-10-CM

## 2015-06-18 DIAGNOSIS — R531 Weakness: Secondary | ICD-10-CM | POA: Diagnosis not present

## 2015-06-18 DIAGNOSIS — I517 Cardiomegaly: Secondary | ICD-10-CM

## 2015-06-18 DIAGNOSIS — E1165 Type 2 diabetes mellitus with hyperglycemia: Secondary | ICD-10-CM

## 2015-06-18 DIAGNOSIS — R5383 Other fatigue: Secondary | ICD-10-CM | POA: Insufficient documentation

## 2015-06-18 DIAGNOSIS — Z9221 Personal history of antineoplastic chemotherapy: Secondary | ICD-10-CM | POA: Diagnosis not present

## 2015-06-18 DIAGNOSIS — Z8546 Personal history of malignant neoplasm of prostate: Secondary | ICD-10-CM | POA: Diagnosis not present

## 2015-06-18 DIAGNOSIS — M129 Arthropathy, unspecified: Secondary | ICD-10-CM | POA: Diagnosis not present

## 2015-06-18 DIAGNOSIS — R0602 Shortness of breath: Secondary | ICD-10-CM | POA: Insufficient documentation

## 2015-06-18 DIAGNOSIS — I10 Essential (primary) hypertension: Secondary | ICD-10-CM | POA: Diagnosis not present

## 2015-06-18 DIAGNOSIS — E785 Hyperlipidemia, unspecified: Secondary | ICD-10-CM

## 2015-06-18 NOTE — Progress Notes (Signed)
Pt here today for follow up regarding bladder cancer and PET results; had heart attack July 14th and stent placed July 18th; c/o SOB with exertion denies chest pain now;

## 2015-06-19 ENCOUNTER — Ambulatory Visit: Payer: PPO | Admitting: Family Medicine

## 2015-06-19 ENCOUNTER — Telehealth: Payer: Self-pay | Admitting: *Deleted

## 2015-06-19 NOTE — Telephone Encounter (Signed)
Left vm, that any referral has to go through PMD and to call back if any questions

## 2015-06-21 ENCOUNTER — Institutional Professional Consult (permissible substitution): Payer: PPO | Admitting: Internal Medicine

## 2015-06-22 NOTE — Progress Notes (Signed)
[place]  Telephone:(336) [phone] Fax:(336) [phone]  ID: [name] OB: 02/08/1941  MR#: [id number]  GEX#:[id number]  Patient Care Team: Birdie Sons, MD as PCP - General (Family Medicine) Murrell Redden, MD (Urology) Dionisio David, MD as Consulting Physician (Cardiology) Lloyd Huger, MD as Consulting Physician (Oncology)  CHIEF COMPLAINT:  Chief Complaint  Patient presents with  . Follow-up    urothelial carcinoma    INTERVAL HISTORY: Patient returns to clinic for further evaluation and discussion of his PET scan results. She continues to have increased shortness of breath as well as weakness and fatigue, but recently had an MI with stent replacement. His symptoms are likely cardiac related. He has no neurologic complaints. He denies any recent fevers. He denies any nausea, vomiting, constipation, or diarrhea. He has no urinary complaints. Patient offers no further specific complaints today.   REVIEW OF SYSTEMS:   Review of Systems  Constitutional: Positive for malaise/fatigue.  Respiratory: Positive for shortness of breath.   Cardiovascular: Negative.  Negative for chest pain.  Musculoskeletal: Negative.        Pelvic pain, resolved  Neurological: Positive for weakness.    As per HPI. Otherwise, a complete review of systems is negatve.  PAST MEDICAL HISTORY: Past Medical History  Diagnosis Date  . Abnormal EKG   . Coronary artery disease   . Diabetes mellitus without complication   . Obesity   . Hyperlipidemia   . Hypertension   . 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 Sleep apnea     could not tolerate cpap mask  . Arthritis     oa  . Cancer     bladder and prostate  . History of radiation therapy 2012  . History of chemotherapy 2014  . Bladder cancer   . History of chicken pox     PAST SURGICAL HISTORY: Past Surgical History  Procedure Laterality Date  . Cardiac catheterization  12-06-2003  . Lad stent   2000 x1 stent, 2008 x 1 stent, 2012 x 1 stent  . Hernia repair  yrs ago  . Appendectomy  many yrs ago  . Rotator cuff repair Left yrs ago  . Seed implant to prostate with radiation  2012  . Protatectomy and urostomy  2014  . Total hip arthroplasty Right 12/27/2013    Procedure: RIGHT TOTAL HIP ARTHROPLASTY ANTERIOR APPROACH;  Surgeon: Mauri Pole, MD;  Location: [facility];  Service: Orthopedics;  Laterality: Right;  . Cardiac catheterization N/A 06/04/2015    Procedure: Left Heart Cath and Coronary Angiography;  Surgeon: Corey Skains, MD;  Location: [place] [facility];  Service: Cardiovascular;  Laterality: N/A;  . Cardiac catheterization N/A 06/04/2015    Procedure: Coronary Stent Intervention;  Surgeon: Wellington Hampshire, MD;  Location: [place] [facility];  Service: Cardiovascular;  Laterality: N/A;    FAMILY HISTORY: Reviewed and unchanged. No report of malignancy or chronic disease.     ADVANCED DIRECTIVES:    HEALTH MAINTENANCE: History  Substance Use Topics  . Smoking status: Never Smoker   . Smokeless tobacco: Never Used  . Alcohol Use: No     Comment: occasional     Colonoscopy:  PAP:  Bone density:  Lipid panel:  Allergies  Allergen Reactions  . Celebrex [Celecoxib]     Hematuria Other reaction(s): Bleeding Bleeding in bladder  . Effient [Prasugrel] Other (See Comments)    blood clots, blood in urine Other reaction(s):  Bleeding Bleeding in bladder    Current Outpatient Prescriptions  Medication Sig Dispense Refill  . aspirin EC 81 MG tablet Take 81 mg by mouth daily.    [name] atenolol (TENORMIN) 25 MG tablet Take 1 tablet by mouth daily.    [name] atorvastatin (LIPITOR) 40 MG tablet Take 40 mg by mouth every evening.    . canagliflozin (INVOKANA) 100 MG TABS tablet Take 1 tablet by mouth every morning.    . clopidogrel (PLAVIX) 75 MG tablet Take 75 mg by mouth daily with breakfast.    . Coenzyme Q10 10 MG capsule Take 10 mg by mouth.    [name] glipiZIDE  (GLUCOTROL) 10 MG tablet Take 10 mg by mouth daily before breakfast.    . losartan (COZAAR) 100 MG tablet Take 1 tablet by mouth daily.    . Multiple Vitamin (MULTIVITAMIN WITH MINERALS) TABS tablet Take 1 tablet by mouth daily.    . naproxen sodium (ALEVE) 220 MG tablet Take 1 tablet by mouth daily as needed.    . docusate sodium 100 MG CAPS Take 100 mg by mouth 2 (two) times daily. (Patient not taking: Reported on 06/18/2015) 10 capsule 0  . metFORMIN (GLUCOPHAGE) 500 MG tablet Take 500 mg by mouth 2 (two) times daily with a meal.    . metFORMIN (GLUCOPHAGE-XR) 500 MG 24 hr tablet Take 2 tablets by mouth 2 (two) times daily.     No current facility-administered medications for this visit.   Facility-Administered Medications Ordered in Other Visits  Medication Dose Route Frequency Provider Last Rate Last Dose  . chlorhexidine (HIBICLENS) 4 % liquid 4 application  60 mL Topical Once Danae Orleans, PA-C       And  . chlorhexidine (HIBICLENS) 4 % liquid 4 application  60 mL Topical Once Danae Orleans, PA-C      . sodium chloride 0.9 % injection 10 mL  10 mL Intracatheter PRN Lloyd Huger, MD   10 mL at 05/09/15 0900  . sodium chloride 0.9 % injection 3 mL  3 mL Intravenous PRN Lloyd Huger, MD        OBJECTIVE: Filed Vitals:   06/18/15 1037  BP: 114/71  Pulse: 106  Temp: 97.6 F (36.4 C)     Body mass index is 29.84 kg/(m^2).    ECOG FS:1 - Symptomatic but completely ambulatory  General: Well-developed, well-nourished, no acute distress. Eyes: anicteric sclera. Lungs: Clear to auscultation bilaterally. Heart: Regular rate and rhythm. No rubs, murmurs, or gallops. Abdomen: Soft, nontender, nondistended. No organomegaly noted, normoactive bowel sounds. Musculoskeletal: No edema, cyanosis, or clubbing. Neuro: Alert, answering all questions appropriately. Cranial nerves grossly intact. Skin: No rashes or petechiae noted. Psych: Normal affect.    LAB RESULTS:  Lab  Results  Component Value Date   NA 137 06/05/2015   K 4.1 06/05/2015   CL 109 06/05/2015   CO2 21* 06/05/2015   GLUCOSE 113* 06/05/2015   BUN 23* 06/05/2015   CREATININE 0.90 06/05/2015   CALCIUM 8.5* 06/05/2015   PROT 5.8* 06/01/2015   ALBUMIN 3.1* 06/01/2015   AST 18 06/01/2015   ALT 14* 06/01/2015   ALKPHOS 82 06/01/2015   BILITOT 0.3 06/01/2015   GFRNONAA >60 06/05/2015   GFRAA >60 06/05/2015    Lab Results  Component Value Date   WBC 10.0 06/05/2015   NEUTROABS 8.7* 06/01/2015   HGB 9.9* 06/05/2015   HCT 29.6* 06/05/2015   MCV 90.8 06/05/2015   PLT 209 06/05/2015  STUDIES: Nm Pet Image Restag (ps) Skull Base To Thigh  06/14/2015   CLINICAL DATA:  Subsequent treatment strategy for stage IV urothelial carcinoma.  EXAM: NUCLEAR MEDICINE PET SKULL BASE TO THIGH  TECHNIQUE: 12.7 mCi F-18 FDG was injected intravenously. Full-ring PET imaging was performed from the skull base to thigh after the radiotracer. CT data was obtained and used for attenuation correction and anatomic localization.  FASTING BLOOD GLUCOSE:  Value: 94 mg/dl  COMPARISON:  Multiple exams, including 02/07/2015  FINDINGS: NECK  No hypermetabolic lymph nodes in the neck.  CHEST  Lymph node below the bronchus intermedius measures 8 mm in short axis on image 98 series 3 (formerly 1.1 cm) and has maximum standard uptake value 8.2 (formerly 8.9).  Prior hypermetabolic right apical nodule is no longer present.  Prior pleural-based hypermetabolic lesion adjacent to the right major fissure is no longer present.  Mild lingular atelectasis. Cardiomegaly with coronary artery atherosclerosis.  Small metal density in the left lung, stable.  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  ABDOMEN/PELVIS  Hypermetabolic liver masses appear subjectively similar to prior. Index mass in the dome of the right hepatic lobe measures approximately 3.6 by 2.5 cm on image 124 series 3 it has maximum standard uptake value 13.2 (formerly  51.7)  Hypermetabolic bilateral inguinal adenopathy noted, reduced in size and activity compared to the prior exam. Index left inguinal lymph node measures 1.5 cm in short axis on image 252 of series 3 (formerly 2.3 cm) and has maximum standard uptake value of 13.1 (formerly 17.6).  Physiologic activity in the bowel. Diffuse abnormal penile uptake noted, maximum standard uptake value 19.9, previously 25.3. Aortoiliac atherosclerotic vascular disease.  Right ilial conduit with herniation of omental adipose tissue. Pelvic floor laxity. Right hip prosthesis.  SKELETON  Reduced activity of the osseous metastatic disease. Specifically the large right sacroiliac mass demonstrates considerable internal necrosis with maximum standard uptake value 8.3 (formerly 24.1), and a small index right posterior acetabular column lesion has a maximum standard uptake value of 2.8 (formerly 7.0).  IMPRESSION: 1. Although there is a considerable burden of remaining metastatic disease, there has been some improvement. Specifically, lesions generally measure at significantly reduced metabolic activity compared to prior, and the right pulmonary nodule and right pleural-based nodule have resolved. 2. Other imaging findings of potential clinical significance: Cardiomegaly with coronary artery atherosclerosis. 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 Aortoiliac atherosclerotic vascular disease. Pelvic floor laxity. Right ilial conduit with herniation of omental adipose tissue.   Electronically Signed   By: Van Clines M.D.   On: 06/14/2015 12:57   Dg Chest Port 1 View  06/01/2015   CLINICAL DATA:  Left-sided chest pain starting about 2 hours ago. Shortness of breath and tingling in the left arm and hand.  EXAM: PORTABLE CHEST - 1 VIEW  COMPARISON:  12/21/2013  FINDINGS: Right power port type central venous catheter with tip over the lower SVC. No pneumothorax. Metallic foreign body again demonstrated over the left chest. No change  since prior study. The heart size and mediastinal contours are within normal limits. Both lungs are clear. The visualized skeletal structures are unremarkable.  IMPRESSION: No active disease.   Electronically Signed   By: Lucienne Capers M.D.   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  PLAN:    1.  Urothelial carcinoma: PET scan results reviewed independently and reported as above reveals persistent disease, but significant improvement in disease burden. Patient will  likely need additional chemotherapy in the future, but after lengthy discussion have decided to hold treatment until his cardiac issues resolve. Return to clinic in 1 month for further evaluation and treatment planning if desired.  If patient progresses within one year, continue consider newly approved atezolizumab.   2. Urethral pain: Resolved. Secondary to malignancy, patient has now completed XRT. Patient states he is now off his narcotics. 3. Hyperglycemia: Patient is diabetic. Continue glipizide and metformin as prescribed. Monitor. 4. Anemia: Secondary to chemotherapy, monitor. 5. Weakness and fatigue: Cardiac in nature, treatment per cardiology.  Patient expressed understanding and was in agreement with this plan. He also understands that He can call clinic at any time with any questions, concerns, or complaints.   Urothelial cancer   Staging form: Kidney, AJCC 7th Edition     Clinical stage from 03/16/2015: Stage IV (TX, N1, M1) - Signed by Lloyd Huger, MD on 03/16/2015   Lloyd Huger, MD   06/22/2015 12:42 PM

## 2015-07-04 ENCOUNTER — Encounter: Payer: Self-pay | Admitting: Family Medicine

## 2015-07-16 ENCOUNTER — Other Ambulatory Visit: Payer: Self-pay | Admitting: Family Medicine

## 2015-07-19 ENCOUNTER — Inpatient Hospital Stay: Payer: PPO | Attending: Oncology | Admitting: Oncology

## 2015-07-19 VITALS — BP 145/81 | HR 64 | Temp 95.8°F | Resp 16 | Wt 197.8 lb

## 2015-07-19 DIAGNOSIS — E785 Hyperlipidemia, unspecified: Secondary | ICD-10-CM

## 2015-07-19 DIAGNOSIS — R102 Pelvic and perineal pain: Secondary | ICD-10-CM | POA: Diagnosis not present

## 2015-07-19 DIAGNOSIS — K435 Parastomal hernia without obstruction or  gangrene: Secondary | ICD-10-CM | POA: Insufficient documentation

## 2015-07-19 DIAGNOSIS — C689 Malignant neoplasm of urinary organ, unspecified: Secondary | ICD-10-CM | POA: Insufficient documentation

## 2015-07-19 DIAGNOSIS — Z5111 Encounter for antineoplastic chemotherapy: Secondary | ICD-10-CM | POA: Insufficient documentation

## 2015-07-19 DIAGNOSIS — E669 Obesity, unspecified: Secondary | ICD-10-CM | POA: Diagnosis not present

## 2015-07-19 DIAGNOSIS — C787 Secondary malignant neoplasm of liver and intrahepatic bile duct: Secondary | ICD-10-CM | POA: Diagnosis not present

## 2015-07-19 DIAGNOSIS — Z9221 Personal history of antineoplastic chemotherapy: Secondary | ICD-10-CM

## 2015-07-19 DIAGNOSIS — R5383 Other fatigue: Secondary | ICD-10-CM

## 2015-07-19 DIAGNOSIS — G4730 Sleep apnea, unspecified: Secondary | ICD-10-CM | POA: Diagnosis not present

## 2015-07-19 DIAGNOSIS — I10 Essential (primary) hypertension: Secondary | ICD-10-CM | POA: Diagnosis not present

## 2015-07-19 DIAGNOSIS — R0602 Shortness of breath: Secondary | ICD-10-CM | POA: Diagnosis not present

## 2015-07-19 DIAGNOSIS — D6481 Anemia due to antineoplastic chemotherapy: Secondary | ICD-10-CM | POA: Diagnosis not present

## 2015-07-19 DIAGNOSIS — I2510 Atherosclerotic heart disease of native coronary artery without angina pectoris: Secondary | ICD-10-CM

## 2015-07-19 DIAGNOSIS — E1165 Type 2 diabetes mellitus with hyperglycemia: Secondary | ICD-10-CM | POA: Diagnosis not present

## 2015-07-19 DIAGNOSIS — R531 Weakness: Secondary | ICD-10-CM | POA: Diagnosis not present

## 2015-07-19 DIAGNOSIS — C7951 Secondary malignant neoplasm of bone: Secondary | ICD-10-CM | POA: Insufficient documentation

## 2015-07-19 DIAGNOSIS — Z8546 Personal history of malignant neoplasm of prostate: Secondary | ICD-10-CM | POA: Diagnosis not present

## 2015-07-19 DIAGNOSIS — N281 Cyst of kidney, acquired: Secondary | ICD-10-CM | POA: Diagnosis not present

## 2015-07-19 DIAGNOSIS — Z79899 Other long term (current) drug therapy: Secondary | ICD-10-CM | POA: Diagnosis not present

## 2015-07-19 DIAGNOSIS — I252 Old myocardial infarction: Secondary | ICD-10-CM | POA: Insufficient documentation

## 2015-07-19 DIAGNOSIS — R918 Other nonspecific abnormal finding of lung field: Secondary | ICD-10-CM | POA: Insufficient documentation

## 2015-07-19 DIAGNOSIS — Z923 Personal history of irradiation: Secondary | ICD-10-CM

## 2015-07-19 NOTE — Progress Notes (Signed)
Patient's penile pain is increasing again at a level 8-9/10 on pain scale that decreases to 2/10 with Naproxen.

## 2015-07-23 NOTE — Progress Notes (Signed)
[place]  [phone] Fax:([phone]) [phone]  ID: Louis Alexander OB: January 14, 1941  MR#: [id number]  QBH#:[id number]  Patient Care Team: Birdie Sons, MD as PCP - General (Family Medicine) Murrell Redden, MD (Urology) Dionisio David, MD as Consulting Physician (Cardiology) Lloyd Huger, MD as Consulting Physician (Oncology)  CHIEF COMPLAINT:  Chief Complaint  Patient presents with  . Follow-up    Metastatic urothelial carcinoma    INTERVAL HISTORY: Patient returns to clinic for further evaluation and laboratory work. He has noted increasing pelvic and penile pain over the past several weeks. He no longer is having shortness of breath or chest pain.  He has no neurologic complaints. He denies any recent fevers. He denies any nausea, vomiting, constipation, or diarrhea. He has no urinary complaints. Patient offers no further specific complaints today.   REVIEW OF SYSTEMS:   Review of Systems  Constitutional: Positive for malaise/fatigue.  Respiratory: Negative for shortness of breath.   Cardiovascular: Negative.  Negative for chest pain.  Musculoskeletal:       Pelvic pain.  Neurological: Positive for weakness.    As per HPI. Otherwise, a complete review of systems is negatve.  PAST MEDICAL HISTORY: Past Medical History  Diagnosis Date  . Abnormal EKG   . Coronary artery disease   . Diabetes mellitus without complication   . Obesity   . Hyperlipidemia   . Hypertension   . 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 Sleep apnea     could not tolerate cpap mask  . Arthritis     oa  . Cancer     bladder and prostate  . History of radiation therapy 2012  . History of chemotherapy 2014  . Bladder cancer   . History of chicken pox     PAST SURGICAL HISTORY: Past Surgical History  Procedure Laterality Date  . Cardiac catheterization  12-06-2003  . Lad stent  2000 x1 stent, 2008 x 1 stent, 2012 x 1 stent  . Hernia repair  yrs  ago  . Appendectomy  many yrs ago  . Rotator cuff repair Left yrs ago  . Seed implant to prostate with radiation  2012  . Protatectomy and urostomy  2014  . Total hip arthroplasty Right 12/27/2013    Procedure: RIGHT TOTAL HIP ARTHROPLASTY ANTERIOR APPROACH;  Surgeon: Mauri Pole, MD;  Location: [facility];  Service: Orthopedics;  Laterality: Right;  . Cardiac catheterization N/A 06/04/2015    Procedure: Left Heart Cath and Coronary Angiography;  Surgeon: Corey Skains, MD;  Location: [facility];  Service: Cardiovascular;  Laterality: N/A;  . Cardiac catheterization N/A 06/04/2015    Procedure: Coronary Stent Intervention;  Surgeon: Wellington Hampshire, MD;  Location: [facility];  Service: Cardiovascular;  Laterality: N/A;    FAMILY HISTORY: Reviewed and unchanged. No report of malignancy or chronic disease.     ADVANCED DIRECTIVES:    HEALTH MAINTENANCE: Social History  Substance Use Topics  . Smoking status: Never Smoker   . Smokeless tobacco: Never Used  . Alcohol Use: No     Comment: occasional     Colonoscopy:  PAP:  Bone density:  Lipid panel:  Allergies  Allergen Reactions  . Celebrex [Celecoxib]     Hematuria Other reaction(s): Bleeding Bleeding in bladder  . Effient [Prasugrel] Other (See Comments)    blood clots, blood in urine Other reaction(s): Bleeding Bleeding in bladder    Current  Outpatient Prescriptions  Medication Sig Dispense Refill  . aspirin EC 81 MG tablet Take 81 mg by mouth daily.    [name] atenolol (TENORMIN) 25 MG tablet Take 1 tablet by mouth daily.    [name] atorvastatin (LIPITOR) 40 MG tablet Take 40 mg by mouth every evening.    . canagliflozin (INVOKANA) 100 MG TABS tablet Take 1 tablet by mouth every morning.    . clopidogrel (PLAVIX) 75 MG tablet Take 75 mg by mouth daily with breakfast.    . Coenzyme Q10 10 MG capsule Take 10 mg by mouth.    . docusate sodium 100 MG CAPS Take 100 mg by mouth 2 (two) times daily. 10 capsule 0   . glipiZIDE (GLUCOTROL) 10 MG tablet TAKE 1 TABLET(S) BY MOUTH DAILY 30 tablet 6  . losartan (COZAAR) 100 MG tablet Take 1 tablet by mouth daily.    . metFORMIN (GLUCOPHAGE) 500 MG tablet Take 500 mg by mouth 2 (two) times daily with a meal.    . metFORMIN (GLUCOPHAGE-XR) 500 MG 24 hr tablet Take 2 tablets by mouth 2 (two) times daily.    . Multiple Vitamin (MULTIVITAMIN WITH MINERALS) TABS tablet Take 1 tablet by mouth daily.    . naproxen sodium (ALEVE) 220 MG tablet Take 1 tablet by mouth daily as needed.     No current facility-administered medications for this visit.   Facility-Administered Medications Ordered in Other Visits  Medication Dose Route Frequency Provider Last Rate Last Dose  . chlorhexidine (HIBICLENS) 4 % liquid 4 application  60 mL Topical Once Danae Orleans, PA-C       And  . chlorhexidine (HIBICLENS) 4 % liquid 4 application  60 mL Topical Once Danae Orleans, PA-C      . sodium chloride 0.9 % injection 10 mL  10 mL Intracatheter PRN Lloyd Huger, MD   10 mL at 05/09/15 0900  . sodium chloride 0.9 % injection 3 mL  3 mL Intravenous PRN Lloyd Huger, MD        OBJECTIVE: Filed Vitals:   07/19/15 1153  BP: 145/81  Pulse: 64  Temp: 95.8 F (35.4 C)  Resp: 16     Body mass index is 27.59 kg/(m^2).    ECOG FS:1 - Symptomatic but completely ambulatory  General: Well-developed, well-nourished, no acute distress. Eyes: anicteric sclera. Lungs: Clear to auscultation bilaterally. Heart: Regular rate and rhythm. No rubs, murmurs, or gallops. Abdomen: Soft, nontender, nondistended. No organomegaly noted, normoactive bowel sounds. Musculoskeletal: No edema, cyanosis, or clubbing. Neuro: Alert, answering all questions appropriately. Cranial nerves grossly intact. Skin: No rashes or petechiae noted. Psych: Normal affect.    LAB RESULTS:  Lab Results  Component Value Date   NA 137 06/05/2015   K 4.1 06/05/2015   CL 109 06/05/2015   CO2 21*  06/05/2015   GLUCOSE 113* 06/05/2015   BUN 23* 06/05/2015   CREATININE 0.90 06/05/2015   CALCIUM 8.5* 06/05/2015   PROT 5.8* 06/01/2015   ALBUMIN 3.1* 06/01/2015   AST 18 06/01/2015   ALT 14* 06/01/2015   ALKPHOS 82 06/01/2015   BILITOT 0.3 06/01/2015   GFRNONAA >60 06/05/2015   GFRAA >60 06/05/2015    Lab Results  Component Value Date   WBC 10.0 06/05/2015   NEUTROABS 8.7* 06/01/2015   HGB 9.9* 06/05/2015   HCT 29.6* 06/05/2015   MCV 90.8 06/05/2015   PLT 209 06/05/2015     STUDIES: No results found.  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  PLAN:    1.  Urothelial carcinoma: Patient's recurrent pain is likely secondary to progressive disease. Will get a PET scan in 1 week to assess for interval change and then patient will reinitiate chemotherapy with cisplatin and gemcitabine several days later.  If patient progresses within one year, continue consider newly approved atezolizumab.   2. 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 Continue current narcotic regimen as prescribed. 3. Hyperglycemia: Patient is diabetic. Continue glipizide and metformin as prescribed. Monitor. 4. Anemia: Secondary to chemotherapy, monitor.  Patient expressed understanding and was in agreement with this plan. He also understands that He can call clinic at any time with any questions, concerns, or complaints.   Urothelial cancer   Staging form: Kidney, AJCC 7th Edition     Clinical stage from 03/16/2015: Stage IV (TX, N1, M1) - Signed by Lloyd Huger, MD on 03/16/2015   Lloyd Huger, MD   07/23/2015 11:11 AM

## 2015-07-26 ENCOUNTER — Ambulatory Visit: Payer: PPO | Admitting: Oncology

## 2015-07-26 ENCOUNTER — Other Ambulatory Visit: Payer: PPO

## 2015-07-26 ENCOUNTER — Ambulatory Visit
Admission: RE | Admit: 2015-07-26 | Discharge: 2015-07-26 | Disposition: A | Discharge status: Home / Self Care | Payer: PPO | Source: Ambulatory Visit | Attending: Oncology | Admitting: Oncology

## 2015-07-26 DIAGNOSIS — C688 Malignant neoplasm of overlapping sites of urinary organs: Secondary | ICD-10-CM | POA: Insufficient documentation

## 2015-07-26 DIAGNOSIS — C7951 Secondary malignant neoplasm of bone: Secondary | ICD-10-CM | POA: Insufficient documentation

## 2015-07-26 DIAGNOSIS — C787 Secondary malignant neoplasm of liver and intrahepatic bile duct: Secondary | ICD-10-CM | POA: Insufficient documentation

## 2015-07-26 DIAGNOSIS — R911 Solitary pulmonary nodule: Secondary | ICD-10-CM | POA: Insufficient documentation

## 2015-07-26 DIAGNOSIS — C689 Malignant neoplasm of urinary organ, unspecified: Secondary | ICD-10-CM

## 2015-07-26 LAB — GLUCOSE, CAPILLARY: GLUCOSE-CAPILLARY: 116 mg/dL — AB (ref 65–99)

## 2015-07-26 MED ORDER — FLUDEOXYGLUCOSE F - 18 (FDG) INJECTION
11.8700 | Freq: Once | INTRAVENOUS | Status: DC | PRN
  Administered 2015-07-26: 11.87 via INTRAVENOUS
  Filled 2015-07-26: qty 11.87

## 2015-07-27 ENCOUNTER — Inpatient Hospital Stay: Payer: PPO

## 2015-07-30 ENCOUNTER — Inpatient Hospital Stay (HOSPITAL_BASED_OUTPATIENT_CLINIC_OR_DEPARTMENT_OTHER): Payer: PPO | Admitting: Oncology

## 2015-07-30 ENCOUNTER — Other Ambulatory Visit: Payer: PPO

## 2015-07-30 ENCOUNTER — Ambulatory Visit: Payer: PPO | Admitting: Oncology

## 2015-07-30 ENCOUNTER — Inpatient Hospital Stay: Payer: PPO

## 2015-07-30 VITALS — BP 159/85 | HR 65 | Temp 97.1°F | Resp 16 | Wt 200.4 lb

## 2015-07-30 DIAGNOSIS — R102 Pelvic and perineal pain: Secondary | ICD-10-CM

## 2015-07-30 DIAGNOSIS — E1165 Type 2 diabetes mellitus with hyperglycemia: Secondary | ICD-10-CM

## 2015-07-30 DIAGNOSIS — E785 Hyperlipidemia, unspecified: Secondary | ICD-10-CM

## 2015-07-30 DIAGNOSIS — R5383 Other fatigue: Secondary | ICD-10-CM

## 2015-07-30 DIAGNOSIS — G4730 Sleep apnea, unspecified: Secondary | ICD-10-CM

## 2015-07-30 DIAGNOSIS — C689 Malignant neoplasm of urinary organ, unspecified: Secondary | ICD-10-CM

## 2015-07-30 DIAGNOSIS — R531 Weakness: Secondary | ICD-10-CM

## 2015-07-30 DIAGNOSIS — K435 Parastomal hernia without obstruction or  gangrene: Secondary | ICD-10-CM

## 2015-07-30 DIAGNOSIS — Z8546 Personal history of malignant neoplasm of prostate: Secondary | ICD-10-CM

## 2015-07-30 DIAGNOSIS — E669 Obesity, unspecified: Secondary | ICD-10-CM

## 2015-07-30 DIAGNOSIS — Z79899 Other long term (current) drug therapy: Secondary | ICD-10-CM

## 2015-07-30 DIAGNOSIS — D6481 Anemia due to antineoplastic chemotherapy: Secondary | ICD-10-CM

## 2015-07-30 DIAGNOSIS — I2510 Atherosclerotic heart disease of native coronary artery without angina pectoris: Secondary | ICD-10-CM

## 2015-07-30 DIAGNOSIS — N281 Cyst of kidney, acquired: Secondary | ICD-10-CM

## 2015-07-30 DIAGNOSIS — C787 Secondary malignant neoplasm of liver and intrahepatic bile duct: Secondary | ICD-10-CM | POA: Diagnosis not present

## 2015-07-30 DIAGNOSIS — Z9221 Personal history of antineoplastic chemotherapy: Secondary | ICD-10-CM

## 2015-07-30 DIAGNOSIS — C7951 Secondary malignant neoplasm of bone: Secondary | ICD-10-CM | POA: Diagnosis not present

## 2015-07-30 DIAGNOSIS — R918 Other nonspecific abnormal finding of lung field: Secondary | ICD-10-CM

## 2015-07-30 DIAGNOSIS — I10 Essential (primary) hypertension: Secondary | ICD-10-CM

## 2015-07-30 DIAGNOSIS — R0602 Shortness of breath: Secondary | ICD-10-CM

## 2015-07-30 DIAGNOSIS — I252 Old myocardial infarction: Secondary | ICD-10-CM

## 2015-07-30 DIAGNOSIS — Z5111 Encounter for antineoplastic chemotherapy: Secondary | ICD-10-CM | POA: Diagnosis not present

## 2015-07-30 DIAGNOSIS — Z923 Personal history of irradiation: Secondary | ICD-10-CM

## 2015-07-30 LAB — CBC WITH DIFFERENTIAL/PLATELET
BASOS PCT: 1 %
Basophils Absolute: 0 10*3/uL (ref 0–0.1)
EOS ABS: 0.3 10*3/uL (ref 0–0.7)
EOS PCT: 4 %
HEMATOCRIT: 37.3 % — AB (ref 40.0–52.0)
Hemoglobin: 12.5 g/dL — ABNORMAL LOW (ref 13.0–18.0)
Lymphocytes Relative: 20 %
Lymphs Abs: 1.5 10*3/uL (ref 1.0–3.6)
MCH: 28.9 pg (ref 26.0–34.0)
MCHC: 33.4 g/dL (ref 32.0–36.0)
MCV: 86.3 fL (ref 80.0–100.0)
MONO ABS: 0.7 10*3/uL (ref 0.2–1.0)
MONOS PCT: 9 %
NEUTROS ABS: 5.2 10*3/uL (ref 1.4–6.5)
Neutrophils Relative %: 66 %
PLATELETS: 256 10*3/uL (ref 150–440)
RBC: 4.32 MIL/uL — ABNORMAL LOW (ref 4.40–5.90)
RDW: 15.4 % — AB (ref 11.5–14.5)
WBC: 7.7 10*3/uL (ref 3.8–10.6)

## 2015-07-30 LAB — COMPREHENSIVE METABOLIC PANEL
ALBUMIN: 3.8 g/dL (ref 3.5–5.0)
ALT: 19 U/L (ref 17–63)
ANION GAP: 8 (ref 5–15)
AST: 43 U/L — ABNORMAL HIGH (ref 15–41)
Alkaline Phosphatase: 85 U/L (ref 38–126)
BILIRUBIN TOTAL: 0.8 mg/dL (ref 0.3–1.2)
BUN: 22 mg/dL — ABNORMAL HIGH (ref 6–20)
CO2: 27 mmol/L (ref 22–32)
Calcium: 8.8 mg/dL — ABNORMAL LOW (ref 8.9–10.3)
Chloride: 101 mmol/L (ref 101–111)
Creatinine, Ser: 1.1 mg/dL (ref 0.61–1.24)
GFR calc Af Amer: >60 mL/min (ref >60)
GFR calc non Af Amer: >60 mL/min (ref >60)
GLUCOSE: 222 mg/dL — AB (ref 65–99)
POTASSIUM: 4.3 mmol/L (ref 3.5–5.1)
Sodium: 136 mmol/L (ref 135–145)
TOTAL PROTEIN: 6.6 g/dL (ref 6.5–8.1)

## 2015-07-30 MED ORDER — POTASSIUM CHLORIDE 2 MEQ/ML IV SOLN
Freq: Once | INTRAVENOUS | Status: AC
  Administered 2015-07-30: 10:00:00 via INTRAVENOUS
  Filled 2015-07-30: qty 1000

## 2015-07-30 MED ORDER — SODIUM CHLORIDE 0.9 % IJ SOLN
10.0000 mL | INTRAMUSCULAR | Status: DC | PRN
  Administered 2015-07-30: 10 mL
  Filled 2015-07-30: qty 10

## 2015-07-30 MED ORDER — SODIUM CHLORIDE 0.9 % IV SOLN
1000.0000 mg/m2 | Freq: Once | INTRAVENOUS | Status: AC
  Administered 2015-07-30: 2128 mg via INTRAVENOUS
  Filled 2015-07-30: qty 50.71

## 2015-07-30 MED ORDER — SODIUM CHLORIDE 0.9 % IV SOLN
Freq: Once | INTRAVENOUS | Status: AC
  Administered 2015-07-30: 10:00:00 via INTRAVENOUS
  Filled 2015-07-30: qty 1000

## 2015-07-30 MED ORDER — SODIUM CHLORIDE 0.9 % IV SOLN
35.0000 mg/m2 | Freq: Once | INTRAVENOUS | Status: AC
  Administered 2015-07-30: 75 mg via INTRAVENOUS
  Filled 2015-07-30: qty 75

## 2015-07-30 MED ORDER — PALONOSETRON HCL INJECTION 0.25 MG/5ML
0.2500 mg | Freq: Once | INTRAVENOUS | Status: AC
  Administered 2015-07-30: 0.25 mg via INTRAVENOUS
  Filled 2015-07-30: qty 5

## 2015-07-30 MED ORDER — HEPARIN SOD (PORK) LOCK FLUSH 100 UNIT/ML IV SOLN
500.0000 [IU] | Freq: Once | INTRAVENOUS | Status: AC | PRN
  Administered 2015-07-30: 500 [IU]
  Filled 2015-07-30: qty 5

## 2015-07-30 MED ORDER — SODIUM CHLORIDE 0.9 % IV SOLN
Freq: Once | INTRAVENOUS | Status: AC
  Administered 2015-07-30: 13:00:00 via INTRAVENOUS
  Filled 2015-07-30: qty 5

## 2015-07-30 NOTE — Progress Notes (Signed)
   07/30/15 1000  Clinical Encounter Type  Visited With Patient and family together  Visit Type Follow-up  Consult/Referral To Chaplain  Rounded in infusion suite and provided pastoral support and presence to patient and his wife.  [place] [phone]

## 2015-07-31 ENCOUNTER — Encounter: Payer: PPO | Attending: Cardiovascular Disease | Admitting: *Deleted

## 2015-07-31 VITALS — Ht 70.5 in | Wt 203.3 lb

## 2015-07-31 DIAGNOSIS — I214 Non-ST elevation (NSTEMI) myocardial infarction: Secondary | ICD-10-CM | POA: Diagnosis not present

## 2015-07-31 DIAGNOSIS — Z9861 Coronary angioplasty status: Secondary | ICD-10-CM

## 2015-08-01 ENCOUNTER — Encounter: Payer: Self-pay | Admitting: *Deleted

## 2015-08-01 NOTE — Progress Notes (Signed)
Cardiac Individual Treatment Plan  Patient Details  Name: Louis Alexander MRN: [id number] Date of Birth: 1941/01/20 Referring Provider:  No ref. provider found  Initial Encounter Date:    Visit Diagnosis: No diagnosis found.  Patient's Home Medications on Admission:  Current outpatient prescriptions:  .  aspirin EC 81 MG tablet, Take 81 mg by mouth daily., Disp: , Rfl:  .  atenolol (TENORMIN) 25 MG tablet, Take 1 tablet by mouth daily., Disp: , Rfl:  .  atorvastatin (LIPITOR) 40 MG tablet, Take 40 mg by mouth every evening., Disp: , Rfl:  .  canagliflozin (INVOKANA) 100 MG TABS tablet, Take 1 tablet by mouth every morning., Disp: , Rfl:  .  clopidogrel (PLAVIX) 75 MG tablet, Take 75 mg by mouth daily with breakfast., Disp: , Rfl:  .  Coenzyme Q10 10 MG capsule, Take 10 mg by mouth., Disp: , Rfl:  .  docusate sodium 100 MG CAPS, Take 100 mg by mouth 2 (two) times daily., Disp: 10 capsule, Rfl: 0 .  Fluticasone Furoate-Vilanterol (BREO ELLIPTA) 100-25 MCG/INH AEPB, Inhale 1 Inhaler into the lungs daily., Disp: , Rfl:  .  furosemide (LASIX) 20 MG tablet, Take 20 mg by mouth 2 (two) times daily., Disp: , Rfl:  .  glipiZIDE (GLUCOTROL) 10 MG tablet, TAKE 1 TABLET(S) BY MOUTH DAILY, Disp: 30 tablet, Rfl: 6 .  losartan (COZAAR) 100 MG tablet, Take 1 tablet by mouth daily., Disp: , Rfl:  .  metFORMIN (GLUCOPHAGE) 500 MG tablet, Take 500 mg by mouth 2 (two) times daily with a meal., Disp: , Rfl:  .  metFORMIN (GLUCOPHAGE-XR) 500 MG 24 hr tablet, Take 2 tablets by mouth 2 (two) times daily., Disp: , Rfl:  .  Multiple Vitamin (MULTIVITAMIN WITH MINERALS) TABS tablet, Take 1 tablet by mouth daily., Disp: , Rfl:  .  naproxen sodium (ALEVE) 220 MG tablet, Take 1 tablet by mouth daily as needed., Disp: , Rfl:  .  ticagrelor (BRILINTA) 90 MG TABS tablet, Take by mouth 2 (two) times daily., Disp: , Rfl:  No current facility-administered medications for this visit.  Facility-Administered Medications  Ordered in Other Visits:  .  Shower Chin To Toes With 60 mL chlorhexidine (HIBICLENS) the night before surgery, , , Once **AND** Shower Chin To Toes With 60 mL chlorhexidine (HIBICLENS) in AM of surgery after pre-op clip completed, , , Once **AND** chlorhexidine (HIBICLENS) 4 % liquid 4 application, 60 mL, Topical, Once **AND** chlorhexidine (HIBICLENS) 4 % liquid 4 application, 60 mL, Topical, Once, [employer], PA-C .  sodium chloride 0.9 % injection 10 mL, 10 mL, Intracatheter, PRN, Lloyd Huger, MD, 10 mL at 05/09/15 0900 .  sodium chloride 0.9 % injection 3 mL, 3 mL, Intravenous, PRN, Lloyd Huger, MD  Past Medical History: Past Medical History  Diagnosis Date  . Abnormal EKG   . Coronary artery disease   . Diabetes mellitus without complication   . Obesity   . Hyperlipidemia   . Hypertension   . 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 Sleep apnea     could not tolerate cpap mask  . Arthritis     oa  . Cancer     bladder and prostate  . History of radiation therapy 2012  . History of chemotherapy 2014  . Bladder cancer   . History of chicken pox     Tobacco Use: History  Smoking status  . Never Smoker   Smokeless tobacco  .  Never Used    Labs: Recent Review Flowsheet Data    Labs for ITP Cardiac and Pulmonary Rehab Latest Ref Rng 10/26/2012 12/22/2014 06/12/2015   Cholestrol 0 - 200 mg/dL 134 189 -   LDLCALC - 41 115 -   HDL 35 - 70 mg/dL 36(L) 41 -   Trlycerides 40 - 160 mg/dL 284(H) 165(A) -   Hemoglobin A1c - - 7.2(A) 7.7       Exercise Target Goals:    Exercise Program Goal: Individual exercise prescription set with THRR, safety & activity barriers. Participant demonstrates ability to understand and report RPE using BORG scale, to self-measure pulse accurately, and to acknowledge the importance of the exercise prescription.  Exercise Prescription Goal: Starting with aerobic activity 30 plus minutes a day, 3 days per week for initial  exercise prescription. Provide home exercise prescription and guidelines that participant acknowledges understanding prior to discharge.  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   urostomy tube    Risk Stratification High      6 Minute Walk:     6 Minute Walk      07/31/15 1140       6 Minute Walk   Phase Initial     Distance 850 feet     Walk Time 6 minutes     Resting HR 76 bpm     Resting BP 124/70 mmHg     Max Ex. HR 94 bpm     Max Ex. BP 140/66 mmHg     RPE 12     Symptoms No        Initial Exercise Prescription:     Initial Exercise Prescription - 07/31/15 1100    Date of Initial Exercise Prescription   Date 07/31/15   Treadmill   MPH 1.6   Grade 0   Minutes 10   Bike   Level 0.4   Minutes 10   Recumbant Bike   Level 3   RPM 40   Watts 25   Minutes 10   NuStep   Level 2   Watts 35   Minutes 15   Arm Ergometer   Level 1   Watts 10   Minutes 10   Arm/Foot Ergometer   Level 4   Watts 12   Minutes 10   Cybex   Level 3   RPM 50   Minutes 10   Recumbant Elliptical   Level 1   RPM 40   Watts 10   Minutes 10   Elliptical   Level 1   Speed 3   Minutes 1   REL-XR   Level 2   Watts 30   Minutes 10   Prescription Details   Frequency (times per week) 3   Duration Progress to 30 minutes of continuous aerobic without signs/symptoms of physical distress   Intensity   THRR REST +  30   Ratings of Perceived Exertion 11-15   Progression Continue progressive overload as per policy without signs/symptoms or physical distress.   Resistance Training   Training Prescription Yes   Weight 2   Reps 10-15      Exercise Prescription Changes:   Discharge Exercise Prescription (Final Exercise Prescription Changes):   Nutrition:  Target Goals:  Understanding of nutrition guidelines, daily intake of sodium 1500mg , cholesterol <  200mg , calories 30% from fat and 7% or less from saturated fats, daily to have 5 or more servings of fruits and vegetables.  Biometrics:     Pre Biometrics - 07/31/15 1136    Pre Biometrics   Height 5' 10.5" (1.791 m)   Weight 203 lb 4.8 oz (92.216 kg)   Waist Circumference 40.25 inches   Hip Circumference 44.5 inches   Waist to Hip Ratio 0.9 %   BMI (Calculated) 28.8       Nutrition Therapy Plan and Nutrition Goals:   Nutrition Discharge: Rate Your Plate Scores:   Nutrition Goals Re-Evaluation:   Psychosocial: Target Goals: Acknowledge presence or absence of depression, maximize coping skills, provide positive support system. Participant is able to verbalize types and ability to use techniques and skills needed for reducing stress and depression.  Initial Review & Psychosocial Screening:     Initial Psych Review & Screening - 08/01/15 [place]? Yes   Screening Interventions   Interventions Encouraged to exercise;Program counselor consult      Quality of Life Scores:     Quality of Life - 08/01/15 1208    Quality of Life Scores   Health/Function Pre 19.87 %   Socioeconomic Pre 24.21 %   Psych/Spiritual Pre 24 %   Family Pre 24.8 %   GLOBAL Pre 22.93 %      PHQ-9:     Recent Review Flowsheet Data    Depression screen [facility] 2/9 07/31/2015 05/03/2015   Decreased Interest 0 0   Down, Depressed, Hopeless 0 0   PHQ - 2 Score 0 0   Altered sleeping 3 -   Tired, decreased energy 1 -   Change in appetite 0 -   Feeling bad or failure about yourself  0 -   Trouble concentrating 0 -   Moving slowly or fidgety/restless 1 -   Suicidal thoughts 0 -   PHQ-9 Score 5 -   Difficult doing work/chores Not difficult at all -      Psychosocial Evaluation and Intervention:   Psychosocial Re-Evaluation:   Vocational Rehabilitation: Provide  vocational rehab assistance to qualifying candidates.   Vocational Rehab Evaluation & Intervention:     Vocational Rehab - 07/31/15 1102    Initial Vocational Rehab Evaluation & Intervention   Assessment shows need for Vocational Rehabilitation No      Education: Education Goals: Education classes will be provided on a weekly basis, covering required topics. Participant will state understanding/return demonstration of topics presented.  Learning Barriers/Preferences:     Learning Barriers/Preferences - 07/31/15 1102    Learning Barriers/Preferences   Learning Barriers None   Learning Preferences None      Education Topics: General Nutrition Guidelines/Fats and Fiber: -Group instruction provided by verbal, written material, models and posters to present the general guidelines for heart healthy nutrition. Gives an explanation and review of dietary fats and fiber.   Controlling Sodium/Reading Food Labels: -Group verbal and written material supporting the discussion of sodium use in heart healthy nutrition. Review and explanation with models, verbal and written materials for utilization of the food label.   Exercise Physiology & Risk Factors: - Group verbal and written instruction with models to review the exercise physiology of the cardiovascular system and associated critical values. Details cardiovascular disease risk factors and the goals associated with each risk factor.   Aerobic Exercise & Resistance Training: - Gives group verbal and written discussion on the health impact  of inactivity. On the components of aerobic and resistive training programs and the benefits of this training and how to safely progress through these programs.   Flexibility, Balance, General Exercise Guidelines: - Provides group verbal and written instruction on the benefits of flexibility and balance training programs. Provides general exercise guidelines with specific guidelines to those with heart  or lung disease. Demonstration and skill practice provided.   Stress Management: - Provides group verbal and written instruction about the health risks of elevated stress, cause of high stress, and healthy ways to reduce stress.   Depression: - Provides group verbal and written instruction on the correlation between heart/lung disease and depressed mood, treatment options, and the stigmas associated with seeking treatment.   Anatomy & Physiology of the Heart: - Group verbal and written instruction and models provide basic cardiac anatomy and physiology, with the coronary electrical and arterial systems. Review of: AMI, Angina, Valve disease, Heart Failure, Cardiac Arrhythmia, Pacemakers, and the ICD.   Cardiac Procedures: - Group verbal and written instruction and models to describe the testing methods done to diagnose heart disease. Reviews the outcomes of the test results. Describes the treatment choices: Medical Management, Angioplasty, or Coronary Bypass Surgery.   Cardiac Medications: - Group verbal and written instruction to review commonly prescribed medications for heart disease. Reviews the medication, class of the drug, and side effects. Includes the steps to properly store meds and maintain the prescription regimen.   Go Sex-Intimacy & Heart Disease, Get SMART - Goal Setting: - Group verbal and written instruction through game format to discuss heart disease and the return to sexual intimacy. Provides group verbal and written material to discuss and apply goal setting through the application of the S.M.A.R.T. Method.   Other Matters of the Heart: - Provides group verbal, written materials and models to describe Heart Failure, Angina, Valve Disease, and Diabetes in the realm of heart disease. Includes description of the disease process and treatment options available to the cardiac patient.   Exercise & Equipment Safety: - Individual verbal instruction and demonstration of  equipment use and safety with use of the equipment.   Infection Prevention: - Provides verbal and written material to individual with discussion of infection control including proper hand washing and proper equipment cleaning during exercise session.   Falls Prevention: - Provides verbal and written material to individual with discussion of falls prevention and safety.   Diabetes: - Individual verbal and written instruction to review signs/symptoms of diabetes, desired ranges of glucose level fasting, after meals and with exercise. Advice that pre and post exercise glucose checks will be done for 3 sessions at entry of program.          Documentation from 08/01/2015 in [place]   Date  07/31/15   Educator  SB   Instruction Review Code  2- meets goals/outcomes       Knowledge Questionnaire Score:     Knowledge Questionnaire Score - 08/01/15 1209    Knowledge Questionnaire Score   Pre Score 22/28      Personal Goals and Risk Factors at Admission:     Personal Goals and Risk Factors at Admission - 07/31/15 1103    Personal Goals and Risk Factors on Admission   Increase Aerobic Exercise and Physical Activity Yes   Intervention While in program, learn and follow the exercise prescription taught. Start at a low level workload and increase workload after able to maintain previous level for 30 minutes. Increase time before increasing intensity.  Understand more about Heart/Pulmonary Disease. Yes   Intervention While in program utilize professionals for any questions, and attend the education sessions. Great websites to use are www.americanheart.org or www.lung.org for reliable information.   Diabetes Yes   Goal Blood glucose control identified by blood glucose values, HgbA1C. Participant verbalizes understanding of the signs/symptoms of hyper/hypo glycemia, proper foot care and importance of medication and nutrition plan for blood glucose control.   Intervention Provide  nutrition & aerobic exercise along with prescribed medications to achieve blood glucose in normal ranges: Fasting 65-99 mg/dL   Hypertension Yes   Goal Participant will see blood pressure controlled within the values of 140/26mm/Hg or within value directed by their physician.   Intervention Provide nutrition & aerobic exercise along with prescribed medications to achieve BP 140/90 or less.   Lipids Yes   Goal Cholesterol controlled with medications as prescribed, with individualized exercise RX and with personalized nutrition plan. Value goals: LDL < 70mg , HDL > 40mg . Participant states understanding of desired cholesterol values and following prescriptions.   Intervention Provide nutrition & aerobic exercise along with prescribed medications to achieve LDL 70mg , HDL >40mg .      Personal Goals and Risk Factors Review:    Personal Goals Discharge:     Comments: intial

## 2015-08-01 NOTE — Patient Instructions (Signed)
Patient Instructions  Patient Details  Name: Louis Alexander MRN: [id number] Date of Birth: Apr 18, 1941 Referring Provider:  No ref. provider found  Below are the personal goals you chose as well as exercise and nutrition goals. Our goal is to help you keep on track towards obtaining and maintaining your goals. We will be discussing your progress on these goals with you throughout the program.  Initial Exercise Prescription:     Initial Exercise Prescription - 07/31/15 1100    Date of Initial Exercise Prescription   Date 07/31/15   Treadmill   MPH 1.6   Grade 0   Minutes 10   Bike   Level 0.4   Minutes 10   Recumbant Bike   Level 3   RPM 40   Watts 25   Minutes 10   NuStep   Level 2   Watts 35   Minutes 15   Arm Ergometer   Level 1   Watts 10   Minutes 10   Arm/Foot Ergometer   Level 4   Watts 12   Minutes 10   Cybex   Level 3   RPM 50   Minutes 10   Recumbant Elliptical   Level 1   RPM 40   Watts 10   Minutes 10   Elliptical   Level 1   Speed 3   Minutes 1   REL-XR   Level 2   Watts 30   Minutes 10   Prescription Details   Frequency (times per week) 3   Duration Progress to 30 minutes of continuous aerobic without signs/symptoms of physical distress   Intensity   THRR REST +  30   Ratings of Perceived Exertion 11-15   Progression Continue progressive overload as per policy without signs/symptoms or physical distress.   Resistance Training   Training Prescription Yes   Weight 2   Reps 10-15      Exercise Goals: Frequency: Be able to perform aerobic exercise three times per week working toward 3-5 days per week.  Intensity: Work with a perceived exertion of 11 (fairly light) - 15 (hard) as tolerated. Follow your new exercise prescription and watch for changes in prescription as you progress with the program. Changes will be reviewed with you when they are made.  Duration: You should be able to do 30 minutes of continuous aerobic exercise in  addition to a 5 minute warm-up and a 5 minute cool-down routine.  Nutrition Goals: Your personal nutrition goals will be established when you do your nutrition analysis with the dietician.  The following are nutrition guidelines to follow: Cholesterol < 200mg /day Sodium < 1500mg /day Fiber: Men over 50 yrs - 30 grams per day  Personal Goals:     Personal Goals and Risk Factors at Admission - 07/31/15 1103    Personal Goals and Risk Factors on Admission   Increase Aerobic Exercise and Physical Activity Yes   Intervention While in program, learn and follow the exercise prescription taught. Start at a low level workload and increase workload after able to maintain previous level for 30 minutes. Increase time before increasing intensity.   Understand more about Heart/Pulmonary Disease. Yes   Intervention While in program utilize professionals for any questions, and attend the education sessions. Great websites to use are www.americanheart.org or www.lung.org for reliable information.   Diabetes Yes   Goal Blood glucose control identified by blood glucose values, HgbA1C. Participant verbalizes understanding of the signs/symptoms of hyper/hypo glycemia, proper foot care and importance  of medication and nutrition plan for blood glucose control.   Intervention Provide nutrition & aerobic exercise along with prescribed medications to achieve blood glucose in normal ranges: Fasting 65-99 mg/dL   Hypertension Yes   Goal Participant will see blood pressure controlled within the values of 140/78mm/Hg or within value directed by their physician.   Intervention Provide nutrition & aerobic exercise along with prescribed medications to achieve BP 140/90 or less.   Lipids Yes   Goal Cholesterol controlled with medications as prescribed, with individualized exercise RX and with personalized nutrition plan. Value goals: LDL < 70mg , HDL > 40mg . Participant states understanding of desired cholesterol values and  following prescriptions.   Intervention Provide nutrition & aerobic exercise along with prescribed medications to achieve LDL 70mg , HDL >40mg .      Tobacco Use Initial Evaluation: History  Smoking status  . Never Smoker   Smokeless tobacco  . Never Used    Copy of goals given to participant.

## 2015-08-01 NOTE — Patient Instructions (Signed)
Patient Instructions  Patient Details  Name: BOWMAN HIGBIE MRN: [id number] Date of Birth: Mar 01, 1941 Referring Provider:  Dionisio David, MD  Below are the personal goals you chose as well as exercise and nutrition goals. Our goal is to help you keep on track towards obtaining and maintaining your goals. We will be discussing your progress on these goals with you throughout the program.  Initial Exercise Prescription:     Initial Exercise Prescription - 07/31/15 1100    Date of Initial Exercise Prescription   Date 07/31/15   Treadmill   MPH 1.6   Grade 0   Minutes 10   Bike   Level 0.4   Minutes 10   Recumbant Bike   Level 3   RPM 40   Watts 25   Minutes 10   NuStep   Level 2   Watts 35   Minutes 15   Arm Ergometer   Level 1   Watts 10   Minutes 10   Arm/Foot Ergometer   Level 4   Watts 12   Minutes 10   Cybex   Level 3   RPM 50   Minutes 10   Recumbant Elliptical   Level 1   RPM 40   Watts 10   Minutes 10   Elliptical   Level 1   Speed 3   Minutes 1   REL-XR   Level 2   Watts 30   Minutes 10   Prescription Details   Frequency (times per week) 3   Duration Progress to 30 minutes of continuous aerobic without signs/symptoms of physical distress   Intensity   THRR REST +  30   Ratings of Perceived Exertion 11-15   Progression Continue progressive overload as per policy without signs/symptoms or physical distress.   Resistance Training   Training Prescription Yes   Weight 2   Reps 10-15      Exercise Goals: Frequency: Be able to perform aerobic exercise three times per week working toward 3-5 days per week.  Intensity: Work with a perceived exertion of 11 (fairly light) - 15 (hard) as tolerated. Follow your new exercise prescription and watch for changes in prescription as you progress with the program. Changes will be reviewed with you when they are made.  Duration: You should be able to do 30 minutes of continuous aerobic exercise in  addition to a 5 minute warm-up and a 5 minute cool-down routine.  Nutrition Goals: Your personal nutrition goals will be established when you do your nutrition analysis with the dietician.  The following are nutrition guidelines to follow: Cholesterol < 200mg /day Sodium < 1500mg /day Fiber: Men over 50 yrs - 30 grams per day  Personal Goals:     Personal Goals and Risk Factors at Admission - 07/31/15 1103    Personal Goals and Risk Factors on Admission   Increase Aerobic Exercise and Physical Activity Yes   Intervention While in program, learn and follow the exercise prescription taught. Start at a low level workload and increase workload after able to maintain previous level for 30 minutes. Increase time before increasing intensity.   Understand more about Heart/Pulmonary Disease. Yes   Intervention While in program utilize professionals for any questions, and attend the education sessions. Great websites to use are www.americanheart.org or www.lung.org for reliable information.   Diabetes Yes   Goal Blood glucose control identified by blood glucose values, HgbA1C. Participant verbalizes understanding of the signs/symptoms of hyper/hypo glycemia, proper foot care and importance  of medication and nutrition plan for blood glucose control.   Intervention Provide nutrition & aerobic exercise along with prescribed medications to achieve blood glucose in normal ranges: Fasting 65-99 mg/dL   Hypertension Yes   Goal Participant will see blood pressure controlled within the values of 140/46mm/Hg or within value directed by their physician.   Intervention Provide nutrition & aerobic exercise along with prescribed medications to achieve BP 140/90 or less.   Lipids Yes   Goal Cholesterol controlled with medications as prescribed, with individualized exercise RX and with personalized nutrition plan. Value goals: LDL < 70mg , HDL > 40mg . Participant states understanding of desired cholesterol values and  following prescriptions.   Intervention Provide nutrition & aerobic exercise along with prescribed medications to achieve LDL 70mg , HDL >40mg .      Tobacco Use Initial Evaluation: History  Smoking status  . Never Smoker   Smokeless tobacco  . Never Used    Copy of goals given to participant.

## 2015-08-01 NOTE — Progress Notes (Signed)
Cardiac Individual Treatment Plan  Patient Details  Name: Louis Alexander MRN: [id number] Date of Birth: September 25, 1941 Referring Provider:  Dionisio David, MD  Initial Encounter Date: Date: 07/31/15  Visit Diagnosis: NSTEMI (non-ST elevated myocardial infarction)  S/P PTCA (percutaneous transluminal coronary angioplasty)  Patient's Home Medications on Admission:  Current outpatient prescriptions:  .  aspirin EC 81 MG tablet, Take 81 mg by mouth daily., Disp: , Rfl:  .  atenolol (TENORMIN) 25 MG tablet, Take 1 tablet by mouth daily., Disp: , Rfl:  .  atorvastatin (LIPITOR) 40 MG tablet, Take 40 mg by mouth every evening., Disp: , Rfl:  .  canagliflozin (INVOKANA) 100 MG TABS tablet, Take 1 tablet by mouth every morning., Disp: , Rfl:  .  Coenzyme Q10 10 MG capsule, Take 10 mg by mouth., Disp: , Rfl:  .  docusate sodium 100 MG CAPS, Take 100 mg by mouth 2 (two) times daily., Disp: 10 capsule, Rfl: 0 .  Fluticasone Furoate-Vilanterol (BREO ELLIPTA) 100-25 MCG/INH AEPB, Inhale 1 Inhaler into the lungs daily., Disp: , Rfl:  .  furosemide (LASIX) 20 MG tablet, Take 20 mg by mouth 2 (two) times daily., Disp: , Rfl:  .  glipiZIDE (GLUCOTROL) 10 MG tablet, TAKE 1 TABLET(S) BY MOUTH DAILY, Disp: 30 tablet, Rfl: 6 .  losartan (COZAAR) 100 MG tablet, Take 1 tablet by mouth daily., Disp: , Rfl:  .  metFORMIN (GLUCOPHAGE) 500 MG tablet, Take 500 mg by mouth 2 (two) times daily with a meal., Disp: , Rfl:  .  Multiple Vitamin (MULTIVITAMIN WITH MINERALS) TABS tablet, Take 1 tablet by mouth daily., Disp: , Rfl:  .  naproxen sodium (ALEVE) 220 MG tablet, Take 1 tablet by mouth daily as needed., Disp: , Rfl:  .  ticagrelor (BRILINTA) 90 MG TABS tablet, Take by mouth 2 (two) times daily., Disp: , Rfl:  .  clopidogrel (PLAVIX) 75 MG tablet, Take 75 mg by mouth daily with breakfast., Disp: , Rfl:  .  metFORMIN (GLUCOPHAGE-XR) 500 MG 24 hr tablet, Take 2 tablets by mouth 2 (two) times daily., Disp: , Rfl:   No current facility-administered medications for this visit.  Facility-Administered Medications Ordered in Other Visits:  .  Shower Chin To Toes With 60 mL chlorhexidine (HIBICLENS) the night before surgery, , , Once **AND** Shower Chin To Toes With 60 mL chlorhexidine (HIBICLENS) in AM of surgery after pre-op clip completed, , , Once **AND** chlorhexidine (HIBICLENS) 4 % liquid 4 application, 60 mL, Topical, Once **AND** chlorhexidine (HIBICLENS) 4 % liquid 4 application, 60 mL, Topical, Once, [employer], PA-C .  sodium chloride 0.9 % injection 10 mL, 10 mL, Intracatheter, PRN, Lloyd Huger, MD, 10 mL at 05/09/15 0900 .  sodium chloride 0.9 % injection 3 mL, 3 mL, Intravenous, PRN, Lloyd Huger, MD  Past Medical History: Past Medical History  Diagnosis Date  . Abnormal EKG   . Coronary artery disease   . Diabetes mellitus without complication   . Obesity   . Hyperlipidemia   . Hypertension   . 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 Sleep apnea     could not tolerate cpap mask  . Arthritis     oa  . Cancer     bladder and prostate  . History of radiation therapy 2012  . History of chemotherapy 2014  . Bladder cancer   . History of chicken pox     Tobacco Use: History  Smoking status  .  Never Smoker   Smokeless tobacco  . Never Used    Labs: Recent Review Flowsheet Data    Labs for ITP Cardiac and Pulmonary Rehab Latest Ref Rng 10/26/2012 12/22/2014 06/12/2015   Cholestrol 0 - 200 mg/dL 134 189 -   LDLCALC - 41 115 -   HDL 35 - 70 mg/dL 36(L) 41 -   Trlycerides 40 - 160 mg/dL 284(H) 165(A) -   Hemoglobin A1c - - 7.2(A) 7.7       Exercise Target Goals: Date: 07/31/15  Exercise Program Goal: Individual exercise prescription set with THRR, safety & activity barriers. Participant demonstrates ability to understand and report RPE using BORG scale, to self-measure pulse accurately, and to acknowledge the importance of the exercise  prescription.  Exercise Prescription Goal: Starting with aerobic activity 30 plus minutes a day, 3 days per week for initial exercise prescription. Provide home exercise prescription and guidelines that participant acknowledges understanding prior to discharge.  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   urostomy tube    Risk Stratification High      6 Minute Walk:     6 Minute Walk      07/31/15 1140       6 Minute Walk   Phase Initial     Distance 850 feet     Walk Time 6 minutes     Resting HR 76 bpm     Resting BP 124/70 mmHg     Max Ex. HR 94 bpm     Max Ex. BP 140/66 mmHg     RPE 12     Symptoms No        Initial Exercise Prescription:     Initial Exercise Prescription - 07/31/15 1100    Date of Initial Exercise Prescription   Date 07/31/15   Treadmill   MPH 1.6   Grade 0   Minutes 10   Bike   Level 0.4   Minutes 10   Recumbant Bike   Level 3   RPM 40   Watts 25   Minutes 10   NuStep   Level 2   Watts 35   Minutes 15   Arm Ergometer   Level 1   Watts 10   Minutes 10   Arm/Foot Ergometer   Level 4   Watts 12   Minutes 10   Cybex   Level 3   RPM 50   Minutes 10   Recumbant Elliptical   Level 1   RPM 40   Watts 10   Minutes 10   Elliptical   Level 1   Speed 3   Minutes 1   REL-XR   Level 2   Watts 30   Minutes 10   Prescription Details   Frequency (times per week) 3   Duration Progress to 30 minutes of continuous aerobic without signs/symptoms of physical distress   Intensity   THRR REST +  30   Ratings of Perceived Exertion 11-15   Progression Continue progressive overload as per policy without signs/symptoms or physical distress.   Resistance Training   Training Prescription Yes   Weight 2   Reps 10-15      Exercise  Prescription Changes:   Discharge Exercise Prescription (Final Exercise Prescription Changes):   Nutrition:  Target Goals: Understanding of  nutrition guidelines, daily intake of sodium 1500mg , cholesterol 200mg , calories 30% from fat and 7% or less from saturated fats, daily to have 5 or more servings of fruits and vegetables.  Biometrics:     Pre Biometrics - 07/31/15 1136    Pre Biometrics   Height 5' 10.5" (1.791 m)   Weight 203 lb 4.8 oz (92.216 kg)   Waist Circumference 40.25 inches   Hip Circumference 44.5 inches   Waist to Hip Ratio 0.9 %   BMI (Calculated) 28.8       Nutrition Therapy Plan and Nutrition Goals:   Nutrition Discharge: Rate Your Plate Scores:   Nutrition Goals Re-Evaluation:   Psychosocial: Target Goals: Acknowledge presence or absence of depression, maximize coping skills, provide positive support system. Participant is able to verbalize types and ability to use techniques and skills needed for reducing stress and depression.  Initial Review & Psychosocial Screening:     Initial Psych Review & Screening - 08/01/15 [place]? Yes   Screening Interventions   Interventions Encouraged to exercise;Program counselor consult      Quality of Life Scores:     Quality of Life - 08/01/15 1208    Quality of Life Scores   Health/Function Pre 19.87 %   Socioeconomic Pre 24.21 %   Psych/Spiritual Pre 24 %   Family Pre 24.8 %   GLOBAL Pre 22.93 %      PHQ-9:     Recent Review Flowsheet Data    Depression screen [facility] 2/9 07/31/2015 05/03/2015   Decreased Interest 0 0   Down, Depressed, Hopeless 0 0   PHQ - 2 Score 0 0   Altered sleeping 3 -   Tired, decreased energy 1 -   Change in appetite 0 -   Feeling bad or failure about yourself  0 -   Trouble concentrating 0 -   Moving slowly or fidgety/restless 1 -   Suicidal thoughts 0 -   PHQ-9 Score 5 -   Difficult doing work/chores Not difficult at all -       Psychosocial Evaluation and Intervention:   Psychosocial Re-Evaluation:   Vocational Rehabilitation: Provide vocational rehab assistance to qualifying candidates.   Vocational Rehab Evaluation & Intervention:     Vocational Rehab - 07/31/15 1102    Initial Vocational Rehab Evaluation & Intervention   Assessment shows need for Vocational Rehabilitation No      Education: Education Goals: Education classes will be provided on a weekly basis, covering required topics. Participant will state understanding/return demonstration of topics presented.  Learning Barriers/Preferences:     Learning Barriers/Preferences - 07/31/15 1102    Learning Barriers/Preferences   Learning Barriers None   Learning Preferences None      Education Topics: General Nutrition Guidelines/Fats and Fiber: -Group instruction provided by verbal, written material, models and posters to present the general guidelines for heart healthy nutrition. Gives an explanation and review of dietary fats and fiber.   Controlling Sodium/Reading Food Labels: -Group verbal and written material supporting the discussion of sodium use in heart healthy nutrition. Review and explanation with models, verbal and written materials for utilization of the food label.   Exercise Physiology & Risk Factors: - Group verbal and written instruction with models to review the exercise physiology of the cardiovascular system and associated critical values. Details cardiovascular disease risk factors and the goals associated with each risk factor.   Aerobic Exercise & Resistance Training: - Gives group  verbal and written discussion on the health impact of inactivity. On the components of aerobic and resistive training programs and the benefits of this training and how to safely progress through these programs.   Flexibility, Balance, General Exercise Guidelines: - Provides group verbal and written instruction on the benefits of  flexibility and balance training programs. Provides general exercise guidelines with specific guidelines to those with heart or lung disease. Demonstration and skill practice provided.   Stress Management: - Provides group verbal and written instruction about the health risks of elevated stress, cause of high stress, and healthy ways to reduce stress.   Depression: - Provides group verbal and written instruction on the correlation between heart/lung disease and depressed mood, treatment options, and the stigmas associated with seeking treatment.   Anatomy & Physiology of the Heart: - Group verbal and written instruction and models provide basic cardiac anatomy and physiology, with the coronary electrical and arterial systems. Review of: AMI, Angina, Valve disease, Heart Failure, Cardiac Arrhythmia, Pacemakers, and the ICD.   Cardiac Procedures: - Group verbal and written instruction and models to describe the testing methods done to diagnose heart disease. Reviews the outcomes of the test results. Describes the treatment choices: Medical Management, Angioplasty, or Coronary Bypass Surgery.   Cardiac Medications: - Group verbal and written instruction to review commonly prescribed medications for heart disease. Reviews the medication, class of the drug, and side effects. Includes the steps to properly store meds and maintain the prescription regimen.   Go Sex-Intimacy & Heart Disease, Get SMART - Goal Setting: - Group verbal and written instruction through game format to discuss heart disease and the return to sexual intimacy. Provides group verbal and written material to discuss and apply goal setting through the application of the S.M.A.R.T. Method.   Other Matters of the Heart: - Provides group verbal, written materials and models to describe Heart Failure, Angina, Valve Disease, and Diabetes in the realm of heart disease. Includes description of the disease process and treatment  options available to the cardiac patient.   Exercise & Equipment Safety: - Individual verbal instruction and demonstration of equipment use and safety with use of the equipment.   Infection Prevention: - Provides verbal and written material to individual with discussion of infection control including proper hand washing and proper equipment cleaning during exercise session.   Falls Prevention: - Provides verbal and written material to individual with discussion of falls prevention and safety.   Diabetes: - Individual verbal and written instruction to review signs/symptoms of diabetes, desired ranges of glucose level fasting, after meals and with exercise. Advice that pre and post exercise glucose checks will be done for 3 sessions at entry of program.    Knowledge Questionnaire Score:     Knowledge Questionnaire Score - 08/01/15 1209    Knowledge Questionnaire Score   Pre Score 22/28      Personal Goals and Risk Factors at Admission:     Personal Goals and Risk Factors at Admission - 07/31/15 1103    Personal Goals and Risk Factors on Admission   Increase Aerobic Exercise and Physical Activity Yes   Intervention While in program, learn and follow the exercise prescription taught. Start at a low level workload and increase workload after able to maintain previous level for 30 minutes. Increase time before increasing intensity.   Understand more about Heart/Pulmonary Disease. Yes   Intervention While in program utilize professionals for any questions, and attend the education sessions. Great websites to use are www.americanheart.org  or www.lung.org for reliable information.   Diabetes Yes   Goal Blood glucose control identified by blood glucose values, HgbA1C. Participant verbalizes understanding of the signs/symptoms of hyper/hypo glycemia, proper foot care and importance of medication and nutrition plan for blood glucose control.   Intervention Provide nutrition & aerobic  exercise along with prescribed medications to achieve blood glucose in normal ranges: Fasting 65-99 mg/dL   Hypertension Yes   Goal Participant will see blood pressure controlled within the values of 140/48mm/Hg or within value directed by their physician.   Intervention Provide nutrition & aerobic exercise along with prescribed medications to achieve BP 140/90 or less.   Lipids Yes   Goal Cholesterol controlled with medications as prescribed, with individualized exercise RX and with personalized nutrition plan. Value goals: LDL < 70mg , HDL > 40mg . Participant states understanding of desired cholesterol values and following prescriptions.   Intervention Provide nutrition & aerobic exercise along with prescribed medications to achieve LDL 70mg , HDL >40mg .      Personal Goals and Risk Factors Review:    Personal Goals Discharge:     Comments: initial ITP

## 2015-08-02 NOTE — Progress Notes (Signed)
[facility]  [phone] [phone]) [phone]  ID: [name] OB: March 04, 1941  MR#: [id number]  MBW#:[id number]  Patient Care Team: Birdie Sons, MD as PCP - General (Family Medicine) Murrell Redden, MD (Urology) Dionisio David, MD as Consulting Physician (Cardiology) Lloyd Huger, MD as Consulting Physician (Oncology)  CHIEF COMPLAINT:  Chief Complaint  Patient presents with  . urothelial cancer    INTERVAL HISTORY: Patient returns to clinic for further evaluation, discussion of his imaging, and initiation of cycle 1 of cisplatin and gemcitabine. He continues to have increased pelvic and penile pain. He no longer is having shortness of breath or chest pain.  He has no neurologic complaints. He denies any recent fevers. He denies any nausea, vomiting, constipation, or diarrhea. He has no urinary complaints. Patient offers no further specific complaints today.   REVIEW OF SYSTEMS:   Review of Systems  Constitutional: Positive for malaise/fatigue.  Respiratory: Negative for shortness of breath.   Cardiovascular: Negative.  Negative for chest pain.  Musculoskeletal:       Pelvic pain.  Neurological: Positive for weakness.    As per HPI. Otherwise, a complete review of systems is negatve.  PAST MEDICAL HISTORY: Past Medical History  Diagnosis Date  . Abnormal EKG   . Coronary artery disease   . Diabetes mellitus without complication   . Obesity   . Hyperlipidemia   . Hypertension   . 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 Sleep apnea     could not tolerate cpap mask  . Arthritis     oa  . Cancer     bladder and prostate  . History of radiation therapy 2012  . History of chemotherapy 2014  . Bladder cancer   . History of chicken pox     PAST SURGICAL HISTORY: Past Surgical History  Procedure Laterality Date  . Cardiac catheterization  12-06-2003  . Lad stent  2000 x1 stent, 2008 x 1 stent, 2012 x 1 stent  .  Hernia repair  yrs ago  . Appendectomy  many yrs ago  . Rotator cuff repair Left yrs ago  . Seed implant to prostate with radiation  2012  . Protatectomy and urostomy  2014  . Total hip arthroplasty Right 12/27/2013    Procedure: RIGHT TOTAL HIP ARTHROPLASTY ANTERIOR APPROACH;  Surgeon: Mauri Pole, MD;  Location: [facility];  Service: Orthopedics;  Laterality: Right;  . Cardiac catheterization N/A 06/04/2015    Procedure: Left Heart Cath and Coronary Angiography;  Surgeon: Corey Skains, MD;  Location: [facility];  Service: Cardiovascular;  Laterality: N/A;  . Cardiac catheterization N/A 06/04/2015    Procedure: Coronary Stent Intervention;  Surgeon: Wellington Hampshire, MD;  Location: [facility];  Service: Cardiovascular;  Laterality: N/A;    FAMILY HISTORY: Reviewed and unchanged. No report of malignancy or chronic disease.     ADVANCED DIRECTIVES:    HEALTH MAINTENANCE: Social History  Substance Use Topics  . Smoking status: Never Smoker   . Smokeless tobacco: Never Used  . Alcohol Use: No     Comment: occasional     Colonoscopy:  PAP:  Bone density:  Lipid panel:  Allergies  Allergen Reactions  . Celebrex [Celecoxib]     Hematuria Other reaction(s): Bleeding Bleeding in bladder  . Effient [Prasugrel] Other (See Comments)    blood clots, blood in urine Other reaction(s): Bleeding Bleeding in bladder  Current Outpatient Prescriptions  Medication Sig Dispense Refill  . aspirin EC 81 MG tablet Take 81 mg by mouth daily.    [name] atenolol (TENORMIN) 25 MG tablet Take 1 tablet by mouth daily.    [name] atorvastatin (LIPITOR) 40 MG tablet Take 40 mg by mouth every evening.    . canagliflozin (INVOKANA) 100 MG TABS tablet Take 1 tablet by mouth every morning.    . clopidogrel (PLAVIX) 75 MG tablet Take 75 mg by mouth daily with breakfast.    . Coenzyme Q10 10 MG capsule Take 10 mg by mouth.    . docusate sodium 100 MG CAPS Take 100 mg by mouth 2 (two) times  daily. 10 capsule 0  . glipiZIDE (GLUCOTROL) 10 MG tablet TAKE 1 TABLET(S) BY MOUTH DAILY 30 tablet 6  . losartan (COZAAR) 100 MG tablet Take 1 tablet by mouth daily.    . metFORMIN (GLUCOPHAGE) 500 MG tablet Take 500 mg by mouth 2 (two) times daily with a meal.    . metFORMIN (GLUCOPHAGE-XR) 500 MG 24 hr tablet Take 2 tablets by mouth 2 (two) times daily.    . Multiple Vitamin (MULTIVITAMIN WITH MINERALS) TABS tablet Take 1 tablet by mouth daily.    . naproxen sodium (ALEVE) 220 MG tablet Take 1 tablet by mouth daily as needed.    . Fluticasone Furoate-Vilanterol (BREO ELLIPTA) 100-25 MCG/INH AEPB Inhale 1 Inhaler into the lungs daily.    . furosemide (LASIX) 20 MG tablet Take 20 mg by mouth 2 (two) times daily.    . ticagrelor (BRILINTA) 90 MG TABS tablet Take by mouth 2 (two) times daily.     No current facility-administered medications for this visit.   Facility-Administered Medications Ordered in Other Visits  Medication Dose Route Frequency Provider Last Rate Last Dose  . chlorhexidine (HIBICLENS) 4 % liquid 4 application  60 mL Topical Once Danae Orleans, PA-C       And  . chlorhexidine (HIBICLENS) 4 % liquid 4 application  60 mL Topical Once Danae Orleans, PA-C      . sodium chloride 0.9 % injection 10 mL  10 mL Intracatheter PRN Lloyd Huger, MD   10 mL at 05/09/15 0900  . sodium chloride 0.9 % injection 3 mL  3 mL Intravenous PRN Lloyd Huger, MD        OBJECTIVE: Filed Vitals:   07/30/15 0930  BP: 159/85  Pulse: 65  Temp: 97.1 F (36.2 C)  Resp: 16     Body mass index is 27.96 kg/(m^2).    ECOG FS:1 - Symptomatic but completely ambulatory  General: Well-developed, well-nourished, no acute distress. Eyes: anicteric sclera. Lungs: Clear to auscultation bilaterally. Heart: Regular rate and rhythm. No rubs, murmurs, or gallops. Abdomen: Soft, nontender, nondistended. No organomegaly noted, normoactive bowel sounds. Musculoskeletal: No edema, cyanosis, or  clubbing. Neuro: Alert, answering all questions appropriately. Cranial nerves grossly intact. Skin: No rashes or petechiae noted. Psych: Normal affect.    LAB RESULTS:  Lab Results  Component Value Date   NA 136 07/30/2015   K 4.3 07/30/2015   CL 101 07/30/2015   CO2 27 07/30/2015   GLUCOSE 222* 07/30/2015   BUN 22* 07/30/2015   CREATININE 1.10 07/30/2015   CALCIUM 8.8* 07/30/2015   PROT 6.6 07/30/2015   ALBUMIN 3.8 07/30/2015   AST 43* 07/30/2015   ALT 19 07/30/2015   ALKPHOS 85 07/30/2015   BILITOT 0.8 07/30/2015   GFRNONAA >60 07/30/2015   GFRAA >60 07/30/2015  Lab Results  Component Value Date   WBC 7.7 07/30/2015   NEUTROABS 5.2 07/30/2015   HGB 12.5* 07/30/2015   HCT 37.3* 07/30/2015   MCV 86.3 07/30/2015   PLT 256 07/30/2015     STUDIES: Nm Pet Image Restag (ps) Skull Base To Thigh  07/26/2015   CLINICAL DATA:  Subsequent treatment strategy for 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 Additional history of prostate cancer.  EXAM: NUCLEAR MEDICINE PET SKULL BASE TO THIGH  TECHNIQUE: 11.9 mCi F-18 FDG was injected intravenously. Full-ring PET imaging was performed from the skull base to thigh after the radiotracer. CT data was obtained and used for attenuation correction and anatomic localization.  FASTING BLOOD GLUCOSE:  Value: 116 mg/dl  COMPARISON:  06/14/2015 PET-CT.  FINDINGS: NECK  No hypermetabolic lymph nodes in the neck. Atherosclerotic calcifications are present in the intracranial and extracranial carotid arteries.  CHEST  Hypermetabolic 1.1 cm subcarinal node (series 3/ image 99) with max SUV 9.9, previously 0.9 cm with max SUV 8.2. No additional hypermetabolic mediastinal nodes. No hypermetabolic axillary or hilar nodes.  Apical right upper lobe 6 mm pulmonary nodule (3/62)  without hypermetabolism, previously 3 mm, increased. Superior segment right lower lobe 6 mm pulmonary nodule (3/88) without hypermetabolism, previously 4 mm, increased. Anterior basilar right lower lobe 5 mm pulmonary nodule (3/111) without hypermetabolism, previously 4 mm, increased. Posterior left upper lobe 6 mm pulmonary nodule (3/77) without hypermetabolism, previously 3 mm, increased. These nodules are below PET resolution. No acute consolidative airspace disease.  Right internal jugular MediPort terminates in the lower third of the superior vena cava. There is atherosclerosis of the thoracic aorta, the great vessels of the mediastinum and the coronary arteries, including calcified atherosclerotic plaque in the left anterior descending, left circumflex and right coronary arteries. Stable 4.4 cm ascending thoracic aortic aneurysm.  ABDOMEN/PELVIS  There is metabolic progression of the four liver metastases, with the largest measuring 3.6 cm in segment 8 (3/122) with max SUV 15.5, previously 3.6 cm with max SUV 13.2.  Metabolic progression of mild left para-aortic lymphadenopathy, for example a 0.8 cm left para-aortic node (3/189) with max SUV 5.8, previously 0.8 cm with max SUV 5.2.  Metabolic progression of bilateral inguinal and left common iliac lymphadenopathy, for example a 1.9 cm left inguinal node (3/252) with max SUV 18.9, previously 1.8 cm with max SUV 13.1.  Metabolic progression of diffuse penile hypermetabolism with max SUV 26.9, previously 19.9.  Stable simple 1.2 cm exophytic interpolar right renal cyst. 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 Mild fullness in the renal collecting systems bilaterally without overt hydronephrosis.  No abnormal hypermetabolic activity within the pancreas, adrenal glands, or spleen. Mild distal colonic diverticulosis.  SKELETON  Metabolic progression of sclerotic  osseous metastases in the right sacrum and medial right iliac bone along the right sacroiliac joint, right acetabulum and right inferior pubic ramus. For example, hypermetabolism in the superior medial right iliac bone metastasis with max SUV 9.4, previously 5.1. Hypermetabolic medial right acetabular metastasis with max SUV 13.1, previously 8.1. Status post right total hip arthroplasty.  IMPRESSION: 1. Metabolic progression of widespread hypermetabolic metastatic disease involving mediastinal, retroperitoneal and bilateral pelvic lymph nodes, liver metastases, right pelvic bone metastases and penile metastasis. 2. Enlarging subcentimeter pulmonary nodules, below PET resolution,  in keeping with progressive pulmonary metastases.   Electronically Signed   By: Ilona Sorrel M.D.   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  PLAN:    1.  Urothelial carcinoma: Patient's recurrent pain is likely secondary to progressive disease. PET scan results reviewed independently and reported as above. Proceed with cycle 1, day 1 of cisplatin and gemcitabine. Patient will receive this regimen on days 1 and 8 with day 15 off. Plan to reimage after 3 or 4 cycles. Return to clinic in 1 week for consideration of cycle 1, day 8.  If patient progresses within one year, continue consider newly approved atezolizumab.   2. Urethral pain: PET scan as above. Secondary to malignancy, patient has now completed XRT. Continue current narcotic regimen as prescribed. 3. Hyperglycemia: Patient is diabetic. Continue glipizide and metformin as prescribed. Monitor. 4. Anemia: Mild, monitor.  Patient expressed understanding and was in agreement with this plan. He also understands that He can call clinic at any time with any questions, concerns, or complaints.   Urothelial cancer   Staging form: Kidney, AJCC 7th Edition     Clinical stage from 03/16/2015: Stage IV (TX, N1, M1) - Signed  by Lloyd Huger, MD on 03/16/2015   Lloyd Huger, MD   08/02/2015 9:20 AM

## 2015-08-03 ENCOUNTER — Encounter: Payer: PPO | Admitting: *Deleted

## 2015-08-03 DIAGNOSIS — I214 Non-ST elevation (NSTEMI) myocardial infarction: Secondary | ICD-10-CM | POA: Diagnosis not present

## 2015-08-03 LAB — GLUCOSE, CAPILLARY
Glucose-Capillary: 225 mg/dL — ABNORMAL HIGH (ref 65–99)
Glucose-Capillary: 231 mg/dL — ABNORMAL HIGH (ref 65–99)

## 2015-08-03 NOTE — Progress Notes (Signed)
Daily Session Note  Patient Details  Name: CHANEY MACLAREN MRN: [id number] Date of Birth: May 18, 1941 Referring Provider:  Dionisio David, MD  Encounter Date: 08/03/2015  Check In:     Session Check In - 08/03/15 0947    Check-In   Staff Present Frederich Cha RRT, RCP Respiratory Therapist;Carroll Enterkin RN, Drusilla Kanner MS, ACSM CEP Exercise Physiologist   ER physicians immediately available to respond to emergencies See telemetry face sheet for immediately available ER MD   Medication changes reported     No   Fall or balance concerns reported    No   Warm-up and Cool-down Performed on first and last piece of equipment   VAD Patient? No   Pain Assessment   Currently in Pain? No/denies   Multiple Pain Sites No         Goals Met:  Independence with exercise equipment Exercise tolerated well Personal goals reviewed No report of cardiac concerns or symptoms Strength training completed today  Goals Unmet:  Not Applicable  Goals Comments: First day of exercise. Oriented Don to equipment and explained his exercise goals and gym procedures. He was pleased with his first day.    Dr. Emily Filbert is Medical Director for [facility].

## 2015-08-06 ENCOUNTER — Encounter: Payer: PPO | Admitting: *Deleted

## 2015-08-06 ENCOUNTER — Other Ambulatory Visit: Payer: Self-pay | Admitting: *Deleted

## 2015-08-06 DIAGNOSIS — C689 Malignant neoplasm of urinary organ, unspecified: Secondary | ICD-10-CM

## 2015-08-06 DIAGNOSIS — I214 Non-ST elevation (NSTEMI) myocardial infarction: Secondary | ICD-10-CM

## 2015-08-06 DIAGNOSIS — Z9861 Coronary angioplasty status: Secondary | ICD-10-CM

## 2015-08-06 LAB — GLUCOSE, CAPILLARY
GLUCOSE-CAPILLARY: 179 mg/dL — AB (ref 65–99)
GLUCOSE-CAPILLARY: 172 mg/dL — AB (ref 65–99)

## 2015-08-06 MED ORDER — FENTANYL 75 MCG/HR TD PT72: 75.0000 ug | MEDICATED_PATCH | TRANSDERMAL | Status: DC

## 2015-08-06 MED ORDER — FENTANYL 50 MCG/HR TD PT72: 50.0000 ug | MEDICATED_PATCH | TRANSDERMAL | Status: DC

## 2015-08-06 NOTE — Progress Notes (Signed)
Daily Session Note  Patient Details  Name: Louis Alexander MRN: [id number] Date of Birth: 01-04-1941 Referring Provider:  Dionisio David, MD  Encounter Date: 08/06/2015  Check In:     Session Check In - 08/06/15 0848    Check-In   Staff Present Candiss Norse MS, ACSM CEP Exercise Physiologist;Susanne Bice RN, BSN, CCRP;Kelly Alfonso Patten, ACSM CEP Exercise Physiologist   ER physicians immediately available to respond to emergencies See telemetry face sheet for immediately available ER MD   Medication changes reported     No   Fall or balance concerns reported    No   Warm-up and Cool-down Performed on first and last piece of equipment   VAD Patient? No   Pain Assessment   Currently in Pain? No/denies   Multiple Pain Sites No         Goals Met:  Independence with exercise equipment Exercise tolerated well No report of cardiac concerns or symptoms Strength training completed today  Goals Unmet:  Not Applicable  Goals Comments: Reviewed Don's exercise goals from Friday; is learning the exercise routine and did well with all exercise goals today.    Dr. Emily Filbert is Medical Director for [facility].

## 2015-08-07 ENCOUNTER — Encounter: Payer: Self-pay | Admitting: *Deleted

## 2015-08-07 ENCOUNTER — Inpatient Hospital Stay (HOSPITAL_BASED_OUTPATIENT_CLINIC_OR_DEPARTMENT_OTHER): Payer: PPO | Admitting: Oncology

## 2015-08-07 ENCOUNTER — Inpatient Hospital Stay: Payer: PPO

## 2015-08-07 VITALS — BP 161/91 | HR 76 | Temp 96.5°F | Wt 200.3 lb

## 2015-08-07 DIAGNOSIS — E1165 Type 2 diabetes mellitus with hyperglycemia: Secondary | ICD-10-CM

## 2015-08-07 DIAGNOSIS — Z5111 Encounter for antineoplastic chemotherapy: Secondary | ICD-10-CM | POA: Diagnosis not present

## 2015-08-07 DIAGNOSIS — R531 Weakness: Secondary | ICD-10-CM

## 2015-08-07 DIAGNOSIS — I252 Old myocardial infarction: Secondary | ICD-10-CM

## 2015-08-07 DIAGNOSIS — R102 Pelvic and perineal pain: Secondary | ICD-10-CM

## 2015-08-07 DIAGNOSIS — C689 Malignant neoplasm of urinary organ, unspecified: Secondary | ICD-10-CM

## 2015-08-07 DIAGNOSIS — C787 Secondary malignant neoplasm of liver and intrahepatic bile duct: Secondary | ICD-10-CM

## 2015-08-07 DIAGNOSIS — E785 Hyperlipidemia, unspecified: Secondary | ICD-10-CM

## 2015-08-07 DIAGNOSIS — Z9221 Personal history of antineoplastic chemotherapy: Secondary | ICD-10-CM

## 2015-08-07 DIAGNOSIS — G4730 Sleep apnea, unspecified: Secondary | ICD-10-CM

## 2015-08-07 DIAGNOSIS — Z923 Personal history of irradiation: Secondary | ICD-10-CM

## 2015-08-07 DIAGNOSIS — C801 Malignant (primary) neoplasm, unspecified: Secondary | ICD-10-CM

## 2015-08-07 DIAGNOSIS — I10 Essential (primary) hypertension: Secondary | ICD-10-CM

## 2015-08-07 DIAGNOSIS — Z8546 Personal history of malignant neoplasm of prostate: Secondary | ICD-10-CM

## 2015-08-07 DIAGNOSIS — R918 Other nonspecific abnormal finding of lung field: Secondary | ICD-10-CM

## 2015-08-07 DIAGNOSIS — N281 Cyst of kidney, acquired: Secondary | ICD-10-CM

## 2015-08-07 DIAGNOSIS — D6481 Anemia due to antineoplastic chemotherapy: Secondary | ICD-10-CM

## 2015-08-07 DIAGNOSIS — I214 Non-ST elevation (NSTEMI) myocardial infarction: Secondary | ICD-10-CM

## 2015-08-07 DIAGNOSIS — I2510 Atherosclerotic heart disease of native coronary artery without angina pectoris: Secondary | ICD-10-CM

## 2015-08-07 DIAGNOSIS — Z9861 Coronary angioplasty status: Secondary | ICD-10-CM

## 2015-08-07 DIAGNOSIS — C7951 Secondary malignant neoplasm of bone: Secondary | ICD-10-CM | POA: Diagnosis not present

## 2015-08-07 DIAGNOSIS — R5383 Other fatigue: Secondary | ICD-10-CM

## 2015-08-07 DIAGNOSIS — Z79899 Other long term (current) drug therapy: Secondary | ICD-10-CM

## 2015-08-07 DIAGNOSIS — E669 Obesity, unspecified: Secondary | ICD-10-CM

## 2015-08-07 DIAGNOSIS — C7910 Secondary malignant neoplasm of unspecified urinary organs: Secondary | ICD-10-CM

## 2015-08-07 DIAGNOSIS — R0602 Shortness of breath: Secondary | ICD-10-CM

## 2015-08-07 DIAGNOSIS — K435 Parastomal hernia without obstruction or  gangrene: Secondary | ICD-10-CM

## 2015-08-07 LAB — COMPREHENSIVE METABOLIC PANEL WITH GFR
ALT: 21 U/L (ref 17–63)
AST: 22 U/L (ref 15–41)
Albumin: 3.5 g/dL (ref 3.5–5.0)
Alkaline Phosphatase: 99 U/L (ref 38–126)
Anion gap: 7 (ref 5–15)
BUN: 21 mg/dL — ABNORMAL HIGH (ref 6–20)
CO2: 26 mmol/L (ref 22–32)
Calcium: 8.4 mg/dL — ABNORMAL LOW (ref 8.9–10.3)
Chloride: 101 mmol/L (ref 101–111)
Creatinine, Ser: 0.98 mg/dL (ref 0.61–1.24)
GFR calc Af Amer: >60 mL/min
GFR calc non Af Amer: >60 mL/min
Glucose, Bld: 258 mg/dL — ABNORMAL HIGH (ref 65–99)
Potassium: 4.3 mmol/L (ref 3.5–5.1)
Sodium: 134 mmol/L — ABNORMAL LOW (ref 135–145)
Total Bilirubin: 0.6 mg/dL (ref 0.3–1.2)
Total Protein: 6.5 g/dL (ref 6.5–8.1)

## 2015-08-07 LAB — CBC WITH DIFFERENTIAL/PLATELET
BASOS ABS: 0 10*3/uL (ref 0–0.1)
BASOS PCT: 1 %
EOS ABS: 0.2 10*3/uL (ref 0–0.7)
EOS PCT: 4 %
HCT: 34.9 % — ABNORMAL LOW (ref 40.0–52.0)
Hemoglobin: 11.7 g/dL — ABNORMAL LOW (ref 13.0–18.0)
Lymphocytes Relative: 23 %
Lymphs Abs: 1.2 10*3/uL (ref 1.0–3.6)
MCH: 29 pg (ref 26.0–34.0)
MCHC: 33.6 g/dL (ref 32.0–36.0)
MCV: 86.1 fL (ref 80.0–100.0)
MONO ABS: 0.4 10*3/uL (ref 0.2–1.0)
Monocytes Relative: 8 %
Neutro Abs: 3.4 10*3/uL (ref 1.4–6.5)
Neutrophils Relative %: 64 %
PLATELETS: 112 10*3/uL — AB (ref 150–440)
RBC: 4.05 MIL/uL — ABNORMAL LOW (ref 4.40–5.90)
RDW: 15.1 % — AB (ref 11.5–14.5)
WBC: 5.2 10*3/uL (ref 3.8–10.6)

## 2015-08-07 MED ORDER — SODIUM CHLORIDE 0.9 % IV SOLN
Freq: Once | INTRAVENOUS | Status: AC
  Administered 2015-08-07: 10:00:00 via INTRAVENOUS
  Filled 2015-08-07: qty 1000

## 2015-08-07 MED ORDER — SODIUM CHLORIDE 0.9 % IV SOLN
Freq: Once | INTRAVENOUS | Status: AC
  Administered 2015-08-07: 13:00:00 via INTRAVENOUS
  Filled 2015-08-07: qty 5

## 2015-08-07 MED ORDER — MORPHINE SULFATE (PF) 2 MG/ML IV SOLN
2.0000 mg | Freq: Once | INTRAVENOUS | Status: AC
  Administered 2015-08-07: 2 mg via INTRAVENOUS
  Filled 2015-08-07: qty 1

## 2015-08-07 MED ORDER — MORPHINE SULFATE (PF) 2 MG/ML IV SOLN
INTRAVENOUS | Status: AC
  Filled 2015-08-07: qty 1

## 2015-08-07 MED ORDER — SODIUM CHLORIDE 0.9 % IV SOLN
1000.0000 mg/m2 | Freq: Once | INTRAVENOUS | Status: AC
  Administered 2015-08-07: 2128 mg via INTRAVENOUS
  Filled 2015-08-07: qty 5.26

## 2015-08-07 MED ORDER — POTASSIUM CHLORIDE 2 MEQ/ML IV SOLN
Freq: Once | INTRAVENOUS | Status: AC
  Administered 2015-08-07: 11:00:00 via INTRAVENOUS
  Filled 2015-08-07: qty 1000

## 2015-08-07 MED ORDER — HEPARIN SOD (PORK) LOCK FLUSH 100 UNIT/ML IV SOLN
500.0000 [IU] | Freq: Once | INTRAVENOUS | Status: AC
  Administered 2015-08-07: 500 [IU] via INTRAVENOUS
  Filled 2015-08-07: qty 5

## 2015-08-07 MED ORDER — PALONOSETRON HCL INJECTION 0.25 MG/5ML
0.2500 mg | Freq: Once | INTRAVENOUS | Status: AC
  Administered 2015-08-07: 0.25 mg via INTRAVENOUS
  Filled 2015-08-07: qty 5

## 2015-08-07 MED ORDER — SODIUM CHLORIDE 0.9 % IV SOLN
35.0000 mg/m2 | Freq: Once | INTRAVENOUS | Status: AC
  Administered 2015-08-07: 75 mg via INTRAVENOUS
  Filled 2015-08-07: qty 75

## 2015-08-07 MED ORDER — MORPHINE SULFATE 4 MG/ML IJ SOLN
2.0000 mg | Freq: Once | INTRAMUSCULAR | Status: DC
  Filled 2015-08-07: qty 1

## 2015-08-07 MED ORDER — SODIUM CHLORIDE 0.9 % IJ SOLN
10.0000 mL | INTRAMUSCULAR | Status: DC | PRN
  Administered 2015-08-07: 10 mL via INTRAVENOUS
  Filled 2015-08-07: qty 10

## 2015-08-07 NOTE — Progress Notes (Signed)
Patient here for follow up no complaints today. 

## 2015-08-07 NOTE — Progress Notes (Signed)
Patient called this a.m. To inform Cardiac Rehab staff he will have to put Cardiac Rehab on hold for now due to the "exercising is antagonizing his tumor/cancer."  Patient further stated the tumor needs to shrink some more before he can return.  Patient does not want to be discharged from the program but wants to be placed on the inactive list for now.  He is hoping to return to Cardiac Rehab in the future.

## 2015-08-14 NOTE — Progress Notes (Signed)
[place]  [phone] Fax:([phone]  ID: [name] OB: 1941-03-21  MR#: [id number]  TDV#:[id number]  Patient Care Team: Birdie Sons, MD as PCP - General (Family Medicine) Murrell Redden, MD (Urology) Dionisio David, MD as Consulting Physician (Cardiology) Lloyd Huger, MD as Consulting Physician (Oncology)  CHIEF COMPLAINT:  Chief Complaint  Patient presents with  . Cancer    Urothelial    INTERVAL HISTORY: Patient returns to clinic for further evaluation and consideration of cycle 1, day 8 of cisplatin and gemcitabine. He continues to have increased pelvic and penile pain, but states that are controlled.[name] He no longer is having shortness of breath or chest pain.  He has no neurologic complaints. He denies any recent fevers. He denies any nausea, vomiting, constipation, or diarrhea. He has no urinary complaints. Patient offers no further specific complaints today.   REVIEW OF SYSTEMS:   Review of Systems  Constitutional: Positive for malaise/fatigue.  Respiratory: Negative for shortness of breath.   Cardiovascular: Negative.  Negative for chest pain.  Musculoskeletal:       Pelvic pain.  Neurological: Positive for weakness.    As per HPI. Otherwise, a complete review of systems is negatve.  PAST MEDICAL HISTORY: Past Medical History  Diagnosis Date  . Abnormal EKG   . Coronary artery disease   . Diabetes mellitus without complication   . Obesity   . Hyperlipidemia   . Hypertension   . 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 Sleep apnea     could not tolerate cpap mask  . Arthritis     oa  . Cancer     bladder and prostate  . History of radiation therapy 2012  . History of chemotherapy 2014  . Bladder cancer   . History of chicken pox     PAST SURGICAL HISTORY: Past Surgical History  Procedure Laterality Date  . Cardiac catheterization  12-06-2003  . Lad stent  2000 x1 stent, 2008 x 1 stent, 2012  x 1 stent  . Hernia repair  yrs ago  . Appendectomy  many yrs ago  . Rotator cuff repair Left yrs ago  . Seed implant to prostate with radiation  2012  . Protatectomy and urostomy  2014  . Total hip arthroplasty Right 12/27/2013    Procedure: RIGHT TOTAL HIP ARTHROPLASTY ANTERIOR APPROACH;  Surgeon: Mauri Pole, MD;  Location: [facility];  Service: Orthopedics;  Laterality: Right;  . Cardiac catheterization N/A 06/04/2015    Procedure: Left Heart Cath and Coronary Angiography;  Surgeon: Corey Skains, MD;  Location: [facility];  Service: Cardiovascular;  Laterality: N/A;  . Cardiac catheterization N/A 06/04/2015    Procedure: Coronary Stent Intervention;  Surgeon: Wellington Hampshire, MD;  Location: [facility];  Service: Cardiovascular;  Laterality: N/A;    FAMILY HISTORY: Reviewed and unchanged. No report of malignancy or chronic disease.     ADVANCED DIRECTIVES:    HEALTH MAINTENANCE: Social History  Substance Use Topics  . Smoking status: Never Smoker   . Smokeless tobacco: Never Used  . Alcohol Use: No     Comment: occasional     Colonoscopy:  PAP:  Bone density:  Lipid panel:  Allergies  Allergen Reactions  . Celebrex [Celecoxib]     Hematuria Other reaction(s): Bleeding Bleeding in bladder  . Effient [Prasugrel] Other (See Comments)    blood clots, blood in urine Other reaction(s): Bleeding  Bleeding in bladder    Current Outpatient Prescriptions  Medication Sig Dispense Refill  . aspirin EC 81 MG tablet Take 81 mg by mouth daily.    [name] atenolol (TENORMIN) 25 MG tablet Take 1 tablet by mouth daily.    [name] atorvastatin (LIPITOR) 40 MG tablet Take 40 mg by mouth every evening.    . canagliflozin (INVOKANA) 100 MG TABS tablet Take 1 tablet by mouth every morning.    . clopidogrel (PLAVIX) 75 MG tablet Take 75 mg by mouth daily with breakfast.    . Coenzyme Q10 10 MG capsule Take 10 mg by mouth.    . docusate sodium 100 MG CAPS Take 100 mg by mouth  2 (two) times daily. 10 capsule 0  . fentaNYL (DURAGESIC - DOSED MCG/HR) 75 MCG/HR Place 1 patch (75 mcg total) onto the skin every 3 (three) days. 10 patch 0  . Fluticasone Furoate-Vilanterol (BREO ELLIPTA) 100-25 MCG/INH AEPB Inhale 1 Inhaler into the lungs daily.    . furosemide (LASIX) 20 MG tablet Take 20 mg by mouth 2 (two) times daily.    [name] glipiZIDE (GLUCOTROL) 10 MG tablet TAKE 1 TABLET(S) BY MOUTH DAILY 30 tablet 6  . losartan (COZAAR) 100 MG tablet Take 1 tablet by mouth daily.    . metFORMIN (GLUCOPHAGE) 500 MG tablet Take 500 mg by mouth 2 (two) times daily with a meal.    . metFORMIN (GLUCOPHAGE-XR) 500 MG 24 hr tablet Take 2 tablets by mouth 2 (two) times daily.    . Multiple Vitamin (MULTIVITAMIN WITH MINERALS) TABS tablet Take 1 tablet by mouth daily.    . naproxen sodium (ALEVE) 220 MG tablet Take 1 tablet by mouth daily as needed.    . ticagrelor (BRILINTA) 90 MG TABS tablet Take by mouth 2 (two) times daily.     No current facility-administered medications for this visit.   Facility-Administered Medications Ordered in Other Visits  Medication Dose Route Frequency Provider Last Rate Last Dose  . chlorhexidine (HIBICLENS) 4 % liquid 4 application  60 mL Topical Once Danae Orleans, PA-C       And  . chlorhexidine (HIBICLENS) 4 % liquid 4 application  60 mL Topical Once Danae Orleans, PA-C      . sodium chloride 0.9 % injection 10 mL  10 mL Intracatheter PRN Lloyd Huger, MD   10 mL at 05/09/15 0900  . sodium chloride 0.9 % injection 3 mL  3 mL Intravenous PRN Lloyd Huger, MD        OBJECTIVE: Filed Vitals:   08/07/15 0934  BP: 161/91  Pulse: 76  Temp: 96.5 F (35.8 C)     Body mass index is 28.32 kg/(m^2).    ECOG FS:1 - Symptomatic but completely ambulatory  General: Well-developed, well-nourished, no acute distress. Eyes: anicteric sclera. Lungs: Clear to auscultation bilaterally. Heart: Regular rate and rhythm. No rubs, murmurs, or  gallops. Abdomen: Soft, nontender, nondistended. No organomegaly noted, normoactive bowel sounds. Musculoskeletal: No edema, cyanosis, or clubbing. Neuro: Alert, answering all questions appropriately. Cranial nerves grossly intact. Skin: No rashes or petechiae noted. Psych: Normal affect.    LAB RESULTS:  Lab Results  Component Value Date   NA 134* 08/07/2015   K 4.3 08/07/2015   CL 101 08/07/2015   CO2 26 08/07/2015   GLUCOSE 258* 08/07/2015   BUN 21* 08/07/2015   CREATININE 0.98 08/07/2015   CALCIUM 8.4* 08/07/2015   PROT 6.5 08/07/2015   ALBUMIN 3.5 08/07/2015   AST  22 08/07/2015   ALT 21 08/07/2015   ALKPHOS 99 08/07/2015   BILITOT 0.6 08/07/2015   GFRNONAA >60 08/07/2015   GFRAA >60 08/07/2015    Lab Results  Component Value Date   WBC 5.2 08/07/2015   NEUTROABS 3.4 08/07/2015   HGB 11.7* 08/07/2015   HCT 34.9* 08/07/2015   MCV 86.1 08/07/2015   PLT 112* 08/07/2015     STUDIES: Nm Pet Image Restag (ps) Skull Base To Thigh  07/26/2015   CLINICAL DATA:  Subsequent treatment strategy for 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 Additional history of prostate cancer.  EXAM: NUCLEAR MEDICINE PET SKULL BASE TO THIGH  TECHNIQUE: 11.9 mCi F-18 FDG was injected intravenously. Full-ring PET imaging was performed from the skull base to thigh after the radiotracer. CT data was obtained and used for attenuation correction and anatomic localization.  FASTING BLOOD GLUCOSE:  Value: 116 mg/dl  COMPARISON:  06/14/2015 PET-CT.  FINDINGS: NECK  No hypermetabolic lymph nodes in the neck. Atherosclerotic calcifications are present in the intracranial and extracranial carotid arteries.  CHEST  Hypermetabolic 1.1 cm subcarinal node (series 3/ image 99) with max SUV 9.9, previously 0.9 cm with max SUV 8.2. No  additional hypermetabolic mediastinal nodes. No hypermetabolic axillary or hilar nodes.  Apical right upper lobe 6 mm pulmonary nodule (3/62) without hypermetabolism, previously 3 mm, increased. Superior segment right lower lobe 6 mm pulmonary nodule (3/88) without hypermetabolism, previously 4 mm, increased. Anterior basilar right lower lobe 5 mm pulmonary nodule (3/111) without hypermetabolism, previously 4 mm, increased. Posterior left upper lobe 6 mm pulmonary nodule (3/77) without hypermetabolism, previously 3 mm, increased. These nodules are below PET resolution. No acute consolidative airspace disease.  Right internal jugular MediPort terminates in the lower third of the superior vena cava. There is atherosclerosis of the thoracic aorta, the great vessels of the mediastinum and the coronary arteries, including calcified atherosclerotic plaque in the left anterior descending, left circumflex and right coronary arteries. Stable 4.4 cm ascending thoracic aortic aneurysm.  ABDOMEN/PELVIS  There is metabolic progression of the four liver metastases, with the largest measuring 3.6 cm in segment 8 (3/122) with max SUV 15.5, previously 3.6 cm with max SUV 13.2.  Metabolic progression of mild left para-aortic lymphadenopathy, for example a 0.8 cm left para-aortic node (3/189) with max SUV 5.8, previously 0.8 cm with max SUV 5.2.  Metabolic progression of bilateral inguinal and left common iliac lymphadenopathy, for example a 1.9 cm left inguinal node (3/252) with max SUV 18.9, previously 1.8 cm with max SUV 13.1.  Metabolic progression of diffuse penile hypermetabolism with max SUV 26.9, previously 19.9.  Stable simple 1.2 cm exophytic interpolar right renal cyst. 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 Mild fullness in the renal collecting systems bilaterally without overt hydronephrosis.  No abnormal hypermetabolic  activity within the pancreas, adrenal glands, or spleen. Mild distal colonic diverticulosis.  SKELETON  Metabolic progression of sclerotic osseous metastases in the right sacrum and medial right iliac bone along the right sacroiliac joint, right acetabulum and right inferior pubic ramus. For example, hypermetabolism in the superior medial right iliac bone metastasis with max SUV 9.4, previously 5.1. Hypermetabolic medial right acetabular metastasis with max SUV 13.1, previously 8.1. Status post right total hip arthroplasty.  IMPRESSION: 1. Metabolic progression  of widespread hypermetabolic metastatic disease involving mediastinal, retroperitoneal and bilateral pelvic lymph nodes, liver metastases, right pelvic bone metastases and penile metastasis. 2. Enlarging subcentimeter pulmonary nodules, below PET resolution, in keeping with progressive pulmonary metastases.   Electronically Signed   By: Ilona Sorrel M.D.   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  PLAN:    1.  Urothelial carcinoma: Patient's recurrent pain is likely secondary to progressive disease. PET scan results reviewed independently and reported as above. Proceed with cycle 1, day 8 of cisplatin and gemcitabine. Patient will receive this regimen on days 1 and 8 with day 15 off. Plan to reimage after 3 or 4 cycles. Return to clinic in 2 weeks for consideration of cycle 2, day 1.  If patient progresses within one year, continue consider newly approved atezolizumab.   2. Urethral pain: PET scan as above. Secondary to malignancy, patient has now completed XRT. Continue current narcotic regimen as prescribed. 3. Hyperglycemia: Patient is diabetic. Continue glipizide and metformin as prescribed. Monitor. 4. Anemia: Mild, monitor.  Patient expressed understanding and was in agreement with this plan. He also understands that He can call clinic at any time with any questions, concerns, or  complaints.   Urothelial cancer   Staging form: Kidney, AJCC 7th Edition     Clinical stage from 03/16/2015: Stage IV (TX, N1, M1) - Signed by Lloyd Huger, MD on 03/16/2015   Lloyd Huger, MD   08/14/2015 9:31 AM

## 2015-08-21 ENCOUNTER — Encounter: Payer: Self-pay | Admitting: *Deleted

## 2015-08-21 ENCOUNTER — Inpatient Hospital Stay: Payer: PPO | Attending: Oncology

## 2015-08-21 ENCOUNTER — Inpatient Hospital Stay (HOSPITAL_BASED_OUTPATIENT_CLINIC_OR_DEPARTMENT_OTHER): Payer: PPO | Admitting: Oncology

## 2015-08-21 ENCOUNTER — Inpatient Hospital Stay: Payer: PPO

## 2015-08-21 VITALS — BP 131/75 | HR 71 | Temp 96.6°F | Resp 18 | Wt 202.2 lb

## 2015-08-21 DIAGNOSIS — C787 Secondary malignant neoplasm of liver and intrahepatic bile duct: Secondary | ICD-10-CM

## 2015-08-21 DIAGNOSIS — M129 Arthropathy, unspecified: Secondary | ICD-10-CM | POA: Diagnosis not present

## 2015-08-21 DIAGNOSIS — E785 Hyperlipidemia, unspecified: Secondary | ICD-10-CM

## 2015-08-21 DIAGNOSIS — E1165 Type 2 diabetes mellitus with hyperglycemia: Secondary | ICD-10-CM | POA: Diagnosis not present

## 2015-08-21 DIAGNOSIS — Z8546 Personal history of malignant neoplasm of prostate: Secondary | ICD-10-CM

## 2015-08-21 DIAGNOSIS — C689 Malignant neoplasm of urinary organ, unspecified: Secondary | ICD-10-CM

## 2015-08-21 DIAGNOSIS — I252 Old myocardial infarction: Secondary | ICD-10-CM

## 2015-08-21 DIAGNOSIS — R531 Weakness: Secondary | ICD-10-CM | POA: Diagnosis not present

## 2015-08-21 DIAGNOSIS — I2510 Atherosclerotic heart disease of native coronary artery without angina pectoris: Secondary | ICD-10-CM | POA: Insufficient documentation

## 2015-08-21 DIAGNOSIS — D649 Anemia, unspecified: Secondary | ICD-10-CM

## 2015-08-21 DIAGNOSIS — Z7984 Long term (current) use of oral hypoglycemic drugs: Secondary | ICD-10-CM | POA: Diagnosis not present

## 2015-08-21 DIAGNOSIS — G4730 Sleep apnea, unspecified: Secondary | ICD-10-CM | POA: Diagnosis not present

## 2015-08-21 DIAGNOSIS — C7910 Secondary malignant neoplasm of unspecified urinary organs: Secondary | ICD-10-CM

## 2015-08-21 DIAGNOSIS — Z5111 Encounter for antineoplastic chemotherapy: Secondary | ICD-10-CM | POA: Diagnosis not present

## 2015-08-21 DIAGNOSIS — Z923 Personal history of irradiation: Secondary | ICD-10-CM | POA: Diagnosis not present

## 2015-08-21 DIAGNOSIS — I10 Essential (primary) hypertension: Secondary | ICD-10-CM | POA: Insufficient documentation

## 2015-08-21 DIAGNOSIS — E669 Obesity, unspecified: Secondary | ICD-10-CM | POA: Insufficient documentation

## 2015-08-21 DIAGNOSIS — Z79899 Other long term (current) drug therapy: Secondary | ICD-10-CM | POA: Diagnosis not present

## 2015-08-21 DIAGNOSIS — R5383 Other fatigue: Secondary | ICD-10-CM | POA: Insufficient documentation

## 2015-08-21 DIAGNOSIS — Z7982 Long term (current) use of aspirin: Secondary | ICD-10-CM | POA: Insufficient documentation

## 2015-08-21 LAB — CBC WITH DIFFERENTIAL/PLATELET
Basophils Absolute: 0 10*3/uL (ref 0–0.1)
Basophils Relative: 1 %
Eosinophils Absolute: 0.2 10*3/uL (ref 0–0.7)
Eosinophils Relative: 2 %
HEMATOCRIT: 30.3 % — AB (ref 40.0–52.0)
HEMOGLOBIN: 10.4 g/dL — AB (ref 13.0–18.0)
LYMPHS ABS: 1.1 10*3/uL (ref 1.0–3.6)
Lymphocytes Relative: 17 %
MCH: 29.2 pg (ref 26.0–34.0)
MCHC: 34.3 g/dL (ref 32.0–36.0)
MCV: 85.2 fL (ref 80.0–100.0)
MONOS PCT: 14 %
Monocytes Absolute: 0.9 10*3/uL (ref 0.2–1.0)
NEUTROS ABS: 4.2 10*3/uL (ref 1.4–6.5)
NEUTROS PCT: 66 %
Platelets: 229 10*3/uL (ref 150–440)
RBC: 3.55 MIL/uL — AB (ref 4.40–5.90)
RDW: 15.1 % — ABNORMAL HIGH (ref 11.5–14.5)
WBC: 6.4 10*3/uL (ref 3.8–10.6)

## 2015-08-21 LAB — COMPREHENSIVE METABOLIC PANEL
ALBUMIN: 3.4 g/dL — AB (ref 3.5–5.0)
ALK PHOS: 85 U/L (ref 38–126)
ALT: 16 U/L — AB (ref 17–63)
ANION GAP: 9 (ref 5–15)
AST: 19 U/L (ref 15–41)
BILIRUBIN TOTAL: 0.5 mg/dL (ref 0.3–1.2)
BUN: 28 mg/dL — ABNORMAL HIGH (ref 6–20)
CALCIUM: 8.6 mg/dL — AB (ref 8.9–10.3)
CO2: 21 mmol/L — AB (ref 22–32)
CREATININE: 1.1 mg/dL (ref 0.61–1.24)
Chloride: 105 mmol/L (ref 101–111)
GFR calc non Af Amer: >60 mL/min (ref >60)
GLUCOSE: 272 mg/dL — AB (ref 65–99)
Potassium: 4.6 mmol/L (ref 3.5–5.1)
Sodium: 135 mmol/L (ref 135–145)
TOTAL PROTEIN: 6.3 g/dL — AB (ref 6.5–8.1)

## 2015-08-21 MED ORDER — SODIUM CHLORIDE 0.9 % IV SOLN
35.0000 mg/m2 | Freq: Once | INTRAVENOUS | Status: AC
  Administered 2015-08-21: 75 mg via INTRAVENOUS
  Filled 2015-08-21: qty 75

## 2015-08-21 MED ORDER — PALONOSETRON HCL INJECTION 0.25 MG/5ML
0.2500 mg | Freq: Once | INTRAVENOUS | Status: AC
  Administered 2015-08-21: 0.25 mg via INTRAVENOUS
  Filled 2015-08-21: qty 5

## 2015-08-21 MED ORDER — HEPARIN SOD (PORK) LOCK FLUSH 100 UNIT/ML IV SOLN
500.0000 [IU] | Freq: Once | INTRAVENOUS | Status: AC | PRN
  Administered 2015-08-21: 500 [IU]
  Filled 2015-08-21: qty 5

## 2015-08-21 MED ORDER — POTASSIUM CHLORIDE 2 MEQ/ML IV SOLN
Freq: Once | INTRAVENOUS | Status: AC
  Administered 2015-08-21: 10:00:00 via INTRAVENOUS
  Filled 2015-08-21: qty 1000

## 2015-08-21 MED ORDER — SODIUM CHLORIDE 0.9 % IV SOLN
1000.0000 mg/m2 | Freq: Once | INTRAVENOUS | Status: AC
  Administered 2015-08-21: 2128 mg via INTRAVENOUS
  Filled 2015-08-21: qty 50.71

## 2015-08-21 MED ORDER — SODIUM CHLORIDE 0.9 % IV SOLN
Freq: Once | INTRAVENOUS | Status: AC
  Administered 2015-08-21: 12:00:00 via INTRAVENOUS
  Filled 2015-08-21: qty 5

## 2015-08-21 MED ORDER — SODIUM CHLORIDE 0.9 % IV SOLN
Freq: Once | INTRAVENOUS | Status: AC
  Administered 2015-08-21: 10:00:00 via INTRAVENOUS
  Filled 2015-08-21: qty 1000

## 2015-08-21 MED ORDER — SODIUM CHLORIDE 0.9 % IJ SOLN
10.0000 mL | INTRAMUSCULAR | Status: DC | PRN
  Administered 2015-08-21 (×2): 10 mL
  Filled 2015-08-21: qty 10

## 2015-08-21 NOTE — Progress Notes (Signed)
Patient's pain is under better control with the Fentanyl patch.

## 2015-08-22 ENCOUNTER — Encounter: Payer: Self-pay | Admitting: *Deleted

## 2015-08-23 ENCOUNTER — Encounter: Payer: Self-pay | Admitting: *Deleted

## 2015-08-23 DIAGNOSIS — I214 Non-ST elevation (NSTEMI) myocardial infarction: Secondary | ICD-10-CM

## 2015-08-23 NOTE — Progress Notes (Signed)
Cardiac Individual Treatment Plan  Patient Details  Name: Louis Alexander MRN: [id number] Date of Birth: July 09, 1941 Referring Provider:  No ref. provider found  Initial Encounter Date:    Visit Diagnosis: NSTEMI (non-ST elevated myocardial infarction) ([place])  Patient's Home Medications on Admission:  Current outpatient prescriptions:  .  aspirin EC 81 MG tablet, Take 81 mg by mouth daily., Disp: , Rfl:  .  atenolol (TENORMIN) 25 MG tablet, Take 1 tablet by mouth daily., Disp: , Rfl:  .  atorvastatin (LIPITOR) 40 MG tablet, Take 40 mg by mouth every evening., Disp: , Rfl:  .  canagliflozin (INVOKANA) 100 MG TABS tablet, Take 1 tablet by mouth every morning., Disp: , Rfl:  .  clopidogrel (PLAVIX) 75 MG tablet, Take 75 mg by mouth daily with breakfast., Disp: , Rfl:  .  Coenzyme Q10 10 MG capsule, Take 10 mg by mouth., Disp: , Rfl:  .  docusate sodium 100 MG CAPS, Take 100 mg by mouth 2 (two) times daily., Disp: 10 capsule, Rfl: 0 .  fentaNYL (DURAGESIC - DOSED MCG/HR) 75 MCG/HR, Place 1 patch (75 mcg total) onto the skin every 3 (three) days., Disp: 10 patch, Rfl: 0 .  Fluticasone Furoate-Vilanterol (BREO ELLIPTA) 100-25 MCG/INH AEPB, Inhale 1 Inhaler into the lungs daily., Disp: , Rfl:  .  furosemide (LASIX) 20 MG tablet, Take 20 mg by mouth 2 (two) times daily., Disp: , Rfl:  .  glipiZIDE (GLUCOTROL) 10 MG tablet, TAKE 1 TABLET(S) BY MOUTH DAILY, Disp: 30 tablet, Rfl: 6 .  losartan (COZAAR) 100 MG tablet, Take 1 tablet by mouth daily., Disp: , Rfl:  .  metFORMIN (GLUCOPHAGE) 500 MG tablet, Take 500 mg by mouth 2 (two) times daily with a meal., Disp: , Rfl:  .  metFORMIN (GLUCOPHAGE-XR) 500 MG 24 hr tablet, Take 2 tablets by mouth 2 (two) times daily., Disp: , Rfl:  .  Multiple Vitamin (MULTIVITAMIN WITH MINERALS) TABS tablet, Take 1 tablet by mouth daily., Disp: , Rfl:  .  naproxen sodium (ALEVE) 220 MG tablet, Take 1 tablet by mouth daily as needed., Disp: , Rfl:  .  ticagrelor  (BRILINTA) 90 MG TABS tablet, Take by mouth 2 (two) times daily., Disp: , Rfl:  No current facility-administered medications for this visit.  Facility-Administered Medications Ordered in Other Visits:  .  Shower Chin To Toes With 60 mL chlorhexidine (HIBICLENS) the night before surgery, , , Once **AND** Shower Chin To Toes With 60 mL chlorhexidine (HIBICLENS) in AM of surgery after pre-op clip completed, , , Once **AND** chlorhexidine (HIBICLENS) 4 % liquid 4 application, 60 mL, Topical, Once **AND** chlorhexidine (HIBICLENS) 4 % liquid 4 application, 60 mL, Topical, Once, [employer], PA-C .  sodium chloride 0.9 % injection 10 mL, 10 mL, Intracatheter, PRN, Lloyd Huger, MD, 10 mL at 05/09/15 0900 .  sodium chloride 0.9 % injection 3 mL, 3 mL, Intravenous, PRN, Lloyd Huger, MD  Past Medical History: Past Medical History  Diagnosis Date  . Abnormal EKG   . Coronary artery disease   . Diabetes mellitus without complication   . Obesity   . Hyperlipidemia   . Hypertension   . 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 Sleep apnea     could not tolerate cpap mask  . Arthritis     oa  . Cancer     bladder and prostate  . History of radiation therapy 2012  . History of chemotherapy 2014  .  Bladder cancer   . History of chicken pox     Tobacco Use: History  Smoking status  . Never Smoker   Smokeless tobacco  . Never Used    Labs: Recent Review Flowsheet Data    Labs for ITP Cardiac and Pulmonary Rehab Latest Ref Rng 10/26/2012 12/22/2014 06/12/2015   Cholestrol 0 - 200 mg/dL 134 189 -   LDLCALC - 41 115 -   HDL 35 - 70 mg/dL 36(L) 41 -   Trlycerides 40 - 160 mg/dL 284(H) 165(A) -   Hemoglobin A1c - - 7.2(A) 7.7       Exercise Target Goals:    Exercise Program Goal: Individual exercise prescription set with THRR, safety & activity barriers. Participant demonstrates ability to understand and report RPE using BORG scale, to self-measure pulse accurately,  and to acknowledge the importance of the exercise prescription.  Exercise Prescription Goal: Starting with aerobic activity 30 plus minutes a day, 3 days per week for initial exercise prescription. Provide home exercise prescription and guidelines Alexander participant acknowledges understanding prior to discharge.  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   urostomy tube    Risk Stratification High      6 Minute Walk:     6 Minute Walk      07/31/15 1140       6 Minute Walk   Phase Initial     Distance 850 feet     Walk Time 6 minutes     Resting HR 76 bpm     Resting BP 124/70 mmHg     Max Ex. HR 94 bpm     Max Ex. BP 140/66 mmHg     RPE 12     Symptoms No        Initial Exercise Prescription:     Initial Exercise Prescription - 07/31/15 1100    Date of Initial Exercise Prescription   Date 07/31/15   Treadmill   MPH 1.6   Grade 0   Minutes 10   Bike   Level 0.4   Minutes 10   Recumbant Bike   Level 3   RPM 40   Watts 25   Minutes 10   NuStep   Level 2   Watts 35   Minutes 15   Arm Ergometer   Level 1   Watts 10   Minutes 10   Arm/Foot Ergometer   Level 4   Watts 12   Minutes 10   Cybex   Level 3   RPM 50   Minutes 10   Recumbant Elliptical   Level 1   RPM 40   Watts 10   Minutes 10   Elliptical   Level 1   Speed 3   Minutes 1   REL-XR   Level 2   Watts 30   Minutes 10   Prescription Details   Frequency (times per week) 3   Duration Progress to 30 minutes of continuous aerobic without signs/symptoms of physical distress   Intensity   THRR REST +  30   Ratings of Perceived Exertion 11-15   Progression Continue progressive overload as per policy without signs/symptoms or physical distress.   Resistance Training   Training Prescription  Yes   Weight 2   Reps 10-15  Exercise Prescription Changes:     Exercise Prescription Changes      08/21/15 0700 08/22/15 0700         Exercise Review   Progression --  Only two visits recorded No  Only 2 visits recorded and will be out for several weeks      Response to Exercise   Blood Pressure (Admit)  140/80 mmHg      Blood Pressure (Exercise)  168/82 mmHg      Blood Pressure (Exit)  134/72 mmHg      Heart Rate (Admit)  71 bpm      Heart Rate (Exercise)  95 bpm      Heart Rate (Exit)  75 bpm      Rating of Perceived Exertion (Exercise)  12      Symptoms  Tired      Comments  Louis Alexander is going to be out for several weeks due to the cancer Alexander he also Alexander. His workloads may need to be reevaluated upon his return.      Duration  Progress to 30 minutes of continuous aerobic without signs/symptoms of physical distress      Intensity  Rest + 30      Progression  Continue progressive overload as per policy without signs/symptoms or physical distress.      Resistance Training   Training Prescription  Yes      Weight  2      Reps  10-15      Interval Training   Interval Training  No      Treadmill   MPH  1.5      Grade  0      Minutes  10      REL-XR   Level  2      Watts  40      Minutes  15         Discharge Exercise Prescription (Final Exercise Prescription Changes):     Exercise Prescription Changes - 08/22/15 0700    Exercise Review   Progression No  Only 2 visits recorded and will be out for several weeks   Response to Exercise   Blood Pressure (Admit) 140/80 mmHg   Blood Pressure (Exercise) 168/82 mmHg   Blood Pressure (Exit) 134/72 mmHg   Heart Rate (Admit) 71 bpm   Heart Rate (Exercise) 95 bpm   Heart Rate (Exit) 75 bpm   Rating of Perceived Exertion (Exercise) 12   Symptoms Tired   Comments Louis Alexander is going to be out for several weeks due to the cancer Alexander he also Alexander. His workloads may need to be reevaluated upon his return.   Duration Progress to  30 minutes of continuous aerobic without signs/symptoms of physical distress   Intensity Rest + 30   Progression Continue progressive overload as per policy without signs/symptoms or physical distress.   Resistance Training   Training Prescription Yes   Weight 2   Reps 10-15   Interval Training   Interval Training No   Treadmill   MPH 1.5   Grade 0   Minutes 10   REL-XR   Level 2   Watts 40   Minutes 15      Nutrition:  Target Goals: Understanding of nutrition guidelines, daily intake of sodium 1500mg , cholesterol 200mg , calories 30% from fat and 7% or less from saturated fats, daily to have 5 or more servings of fruits and vegetables.  Biometrics:     Pre Biometrics - 07/31/15 1136  Pre Biometrics   Height 5' 10.5" (1.791 m)   Weight 203 lb 4.8 oz (92.216 kg)   Waist Circumference 40.25 inches   Hip Circumference 44.5 inches   Waist to Hip Ratio 0.9 %   BMI (Calculated) 28.8       Nutrition Therapy Plan and Nutrition Goals:   Nutrition Discharge: Rate Your Plate Scores:   Nutrition Goals Re-Evaluation:     Nutrition Goals Re-Evaluation      08/23/15 1536           Personal Goal #1 Re-Evaluation   Personal Goal #1 Louis Alexander been out of Cardiac Rehab since 08/06/2015 since he told Louis Alexander "exercising is antogonizing his tumor/cancer".           Psychosocial: Target Goals: Acknowledge presence or absence of depression, maximize coping skills, provide positive support system. Participant is able to verbalize types and ability to use techniques and skills needed for reducing stress and depression.  Initial Review & Psychosocial Screening:     Initial Psych Review & Screening - 08/01/15 [place]? Yes   Screening Interventions   Interventions Encouraged to exercise;Program counselor consult      Quality of Life Scores:     Quality of Life - 08/01/15 1208    Quality of Life Scores    Health/Function Pre 19.87 %   Socioeconomic Pre 24.21 %   Psych/Spiritual Pre 24 %   Family Pre 24.8 %   GLOBAL Pre 22.93 %      PHQ-9:     Recent Review Flowsheet Data    Depression screen [facility] 2/9 07/31/2015 05/03/2015   Decreased Interest 0 0   Down, Depressed, Hopeless 0 0   PHQ - 2 Score 0 0   Altered sleeping 3 -   Tired, decreased energy 1 -   Change in appetite 0 -   Feeling bad or failure about yourself  0 -   Trouble concentrating 0 -   Moving slowly or fidgety/restless 1 -   Suicidal thoughts 0 -   PHQ-9 Score 5 -   Difficult doing work/chores Not difficult at all -      Psychosocial Evaluation and Intervention:   Psychosocial Re-Evaluation:     Psychosocial Re-Evaluation      08/23/15 1537           Psychosocial Re-Evaluation   Interventions Encouraged to attend Cardiac Rehabilitation for the exercise       Comments Many stressors including -Louis Alexander been out of Cardiac Rehab since 08/06/2015 since he told Louis Alexander "exercising is antogonizing his tumor/cancer".          Vocational Rehabilitation: Provide vocational rehab assistance to qualifying candidates.   Vocational Rehab Evaluation & Intervention:     Vocational Rehab - 07/31/15 1102    Initial Vocational Rehab Evaluation & Intervention   Assessment shows need for Vocational Rehabilitation No      Education: Education Goals: Education classes will be provided on a weekly basis, covering required topics. Participant will state understanding/return demonstration of topics presented.  Learning Barriers/Preferences:     Learning Barriers/Preferences - 07/31/15 1102    Learning Barriers/Preferences   Learning Barriers None   Learning Preferences None      Education Topics: General Nutrition Guidelines/Fats and Fiber: -Group instruction provided by verbal, written material, models and posters to present the general guidelines for heart healthy nutrition. Gives an explanation  and review of dietary  fats and fiber.   Controlling Sodium/Reading Food Labels: -Group verbal and written material supporting the discussion of sodium use in heart healthy nutrition. Review and explanation with models, verbal and written materials for utilization of the food label.   Exercise Physiology & Risk Factors: - Group verbal and written instruction with models to review the exercise physiology of the cardiovascular system and associated critical values. Details cardiovascular disease risk factors and the goals associated with each risk factor.   Aerobic Exercise & Resistance Training: - Gives group verbal and written discussion on the health impact of inactivity. On the components of aerobic and resistive training programs and the benefits of this training and how to safely progress through these programs.   Flexibility, Balance, General Exercise Guidelines: - Provides group verbal and written instruction on the benefits of flexibility and balance training programs. Provides general exercise guidelines with specific guidelines to those with heart or lung disease. Demonstration and skill practice provided.          Cardiac Rehab from 08/06/2015 in [facility]   Date  08/06/15   Educator  [facility]   Instruction Review Code  2- meets goals/outcomes      Stress Management: - Provides group verbal and written instruction about the health risks of elevated stress, cause of high stress, and healthy ways to reduce stress.   Depression: - Provides group verbal and written instruction on the correlation between heart/lung disease and depressed mood, treatment options, and the stigmas associated with seeking treatment.   Anatomy & Physiology of the Heart: - Group verbal and written instruction and models provide basic cardiac anatomy and physiology, with the coronary electrical and arterial systems. Review of: AMI, Angina, Valve disease, Heart Failure, Cardiac Arrhythmia, Pacemakers,  and the ICD.   Cardiac Procedures: - Group verbal and written instruction and models to describe the testing methods done to diagnose heart disease. Reviews the outcomes of the test results. Describes the treatment choices: Medical Management, Angioplasty, or Coronary Bypass Surgery.   Cardiac Medications: - Group verbal and written instruction to review commonly prescribed medications for heart disease. Reviews the medication, class of the drug, and side effects. Includes the steps to properly store meds and maintain the prescription regimen.   Go Sex-Intimacy & Heart Disease, Get SMART - Goal Setting: - Group verbal and written instruction through game format to discuss heart disease and the return to sexual intimacy. Provides group verbal and written material to discuss and apply goal setting through the application of the S.M.A.R.T. Method.   Other Matters of the Heart: - Provides group verbal, written materials and models to describe Heart Failure, Angina, Valve Disease, and Diabetes in the realm of heart disease. Includes description of the disease process and treatment options available to the cardiac patient.   Exercise & Equipment Safety: - Individual verbal instruction and demonstration of equipment use and safety with use of the equipment.      Cardiac Rehab from 08/06/2015 in [facility]   Date  08/06/15   Educator  RM   Instruction Review Code  2- meets goals/outcomes      Infection Prevention: - Provides verbal and written material to individual with discussion of infection control including proper hand washing and proper equipment cleaning during exercise session.      Cardiac Rehab from 08/06/2015 in [facility] Cardiac Rehab   Date  08/06/15   Educator  RM   Instruction Review Code  2- meets goals/outcomes  Falls Prevention: - Provides verbal and written material to individual with discussion of falls prevention and safety.   Diabetes: - Individual verbal  and written instruction to review signs/symptoms of diabetes, desired ranges of glucose level fasting, after meals and with exercise. Advice Alexander pre and post exercise glucose checks will be done for 3 sessions at entry of program.      Cardiac Rehab from 08/06/2015 in [facility] Cardiac Rehab   Date  07/31/15   Educator  SB   Instruction Review Code  2- meets goals/outcomes       Knowledge Questionnaire Score:     Knowledge Questionnaire Score - 08/01/15 1209    Knowledge Questionnaire Score   Pre Score 22/28      Personal Goals and Risk Factors at Admission:     Personal Goals and Risk Factors at Admission - 07/31/15 1103    Personal Goals and Risk Factors on Admission   Increase Aerobic Exercise and Physical Activity Yes   Intervention While in program, learn and follow the exercise prescription taught. Start at a low level workload and increase workload after able to maintain previous level for 30 minutes. Increase time before increasing intensity.   Understand more about Heart/Pulmonary Disease. Yes   Intervention While in program utilize professionals for any questions, and attend the education sessions. Great websites to use are www.americanheart.org or www.lung.org for reliable information.   Diabetes Yes   Goal Blood glucose control identified by blood glucose values, HgbA1C. Participant verbalizes understanding of the signs/symptoms of hyper/hypo glycemia, proper foot care and importance of medication and nutrition plan for blood glucose control.   Intervention Provide nutrition & aerobic exercise along with prescribed medications to achieve blood glucose in normal ranges: Fasting 65-99 mg/dL   Hypertension Yes   Goal Participant will see blood pressure controlled within the values of 140/52mm/Hg or within value directed by their physician.   Intervention Provide nutrition & aerobic exercise along with prescribed medications to achieve BP 140/90 or less.   Lipids Yes   Goal  Cholesterol controlled with medications as prescribed, with individualized exercise RX and with personalized nutrition plan. Value goals: LDL < 70mg , HDL > 40mg . Participant states understanding of desired cholesterol values and following prescriptions.   Intervention Provide nutrition & aerobic exercise along with prescribed medications to achieve LDL 70mg , HDL >40mg .      Personal Goals and Risk Factors Review:      Goals and Risk Factor Review      08/23/15 1537           Increase Aerobic Exercise and Physical Activity   Goals Progress/Improvement seen  No       Comments Louis Alexander Alexander been out of Cardiac Rehab since 08/06/2015 since he told Louis Alexander "exercising is antogonizing his tumor/cancer".       Diabetes   Goal --  Louis Alexander Alexander been out of Cardiac Rehab since 08/06/2015 since he told Louis Alexander "exercising is antogonizing his tumor/cancer".       Hypertension   Goal --  Louis Alexander Alexander been out of Cardiac Rehab since 08/06/2015 since he told Louis Alexander "exercising is antogonizing his tumor/cancer".          Personal Goals Discharge (Final Personal Goals and Risk Factors Review):      Goals and Risk Factor Review - 08/23/15 1537    Increase Aerobic Exercise and Physical Activity   Goals Progress/Improvement seen  No   Comments Louis Alexander Alexander been out of  Cardiac Rehab since 08/06/2015 since he told Louis Alexander "exercising is antogonizing his tumor/cancer".   Diabetes   Goal --  Louis Alexander Alexander been out of Cardiac Rehab since 08/06/2015 since he told Louis Alexander "exercising is antogonizing his tumor/cancer".   Hypertension   Goal --  Louis Alexander Alexander been out of Cardiac Rehab since 08/06/2015 since he told Louis Alexander "exercising is antogonizing his tumor/cancer".       Comments: Louis Alexander Alexander been out of Cardiac Rehab since 08/06/2015 since he told Louis Alexander "exercising is antogonizing his tumor/cancer".

## 2015-08-27 ENCOUNTER — Encounter: Payer: PPO | Attending: Cardiovascular Disease

## 2015-08-27 DIAGNOSIS — I214 Non-ST elevation (NSTEMI) myocardial infarction: Secondary | ICD-10-CM | POA: Insufficient documentation

## 2015-08-27 DIAGNOSIS — Z9861 Coronary angioplasty status: Secondary | ICD-10-CM | POA: Insufficient documentation

## 2015-08-28 ENCOUNTER — Inpatient Hospital Stay: Payer: PPO

## 2015-08-28 ENCOUNTER — Inpatient Hospital Stay (HOSPITAL_BASED_OUTPATIENT_CLINIC_OR_DEPARTMENT_OTHER): Payer: PPO | Admitting: Oncology

## 2015-08-28 VITALS — BP 167/88 | HR 90 | Temp 96.9°F | Resp 20 | Wt 199.3 lb

## 2015-08-28 DIAGNOSIS — C787 Secondary malignant neoplasm of liver and intrahepatic bile duct: Secondary | ICD-10-CM

## 2015-08-28 DIAGNOSIS — I10 Essential (primary) hypertension: Secondary | ICD-10-CM

## 2015-08-28 DIAGNOSIS — E785 Hyperlipidemia, unspecified: Secondary | ICD-10-CM

## 2015-08-28 DIAGNOSIS — D649 Anemia, unspecified: Secondary | ICD-10-CM | POA: Diagnosis not present

## 2015-08-28 DIAGNOSIS — C689 Malignant neoplasm of urinary organ, unspecified: Secondary | ICD-10-CM

## 2015-08-28 DIAGNOSIS — I2510 Atherosclerotic heart disease of native coronary artery without angina pectoris: Secondary | ICD-10-CM

## 2015-08-28 DIAGNOSIS — Z5111 Encounter for antineoplastic chemotherapy: Secondary | ICD-10-CM | POA: Diagnosis not present

## 2015-08-28 DIAGNOSIS — E1165 Type 2 diabetes mellitus with hyperglycemia: Secondary | ICD-10-CM

## 2015-08-28 DIAGNOSIS — I252 Old myocardial infarction: Secondary | ICD-10-CM

## 2015-08-28 DIAGNOSIS — Z8546 Personal history of malignant neoplasm of prostate: Secondary | ICD-10-CM

## 2015-08-28 DIAGNOSIS — R5383 Other fatigue: Secondary | ICD-10-CM

## 2015-08-28 DIAGNOSIS — C7910 Secondary malignant neoplasm of unspecified urinary organs: Secondary | ICD-10-CM

## 2015-08-28 DIAGNOSIS — E669 Obesity, unspecified: Secondary | ICD-10-CM

## 2015-08-28 DIAGNOSIS — Z923 Personal history of irradiation: Secondary | ICD-10-CM

## 2015-08-28 DIAGNOSIS — R531 Weakness: Secondary | ICD-10-CM

## 2015-08-28 DIAGNOSIS — M129 Arthropathy, unspecified: Secondary | ICD-10-CM

## 2015-08-28 DIAGNOSIS — G4730 Sleep apnea, unspecified: Secondary | ICD-10-CM

## 2015-08-28 LAB — COMPREHENSIVE METABOLIC PANEL
ALBUMIN: 3.5 g/dL (ref 3.5–5.0)
ALK PHOS: 89 U/L (ref 38–126)
ALT: 17 U/L (ref 17–63)
ANION GAP: 11 (ref 5–15)
AST: 27 U/L (ref 15–41)
BUN: 26 mg/dL — ABNORMAL HIGH (ref 6–20)
CALCIUM: 8.4 mg/dL — AB (ref 8.9–10.3)
CO2: 20 mmol/L — AB (ref 22–32)
CREATININE: 1.19 mg/dL (ref 0.61–1.24)
Chloride: 102 mmol/L (ref 101–111)
GFR calc Af Amer: >60 mL/min (ref >60)
GFR calc non Af Amer: 59 mL/min — ABNORMAL LOW (ref >60)
GLUCOSE: 271 mg/dL — AB (ref 65–99)
Potassium: 4.3 mmol/L (ref 3.5–5.1)
SODIUM: 133 mmol/L — AB (ref 135–145)
Total Bilirubin: 0.7 mg/dL (ref 0.3–1.2)
Total Protein: 6.6 g/dL (ref 6.5–8.1)

## 2015-08-28 LAB — CBC WITH DIFFERENTIAL/PLATELET
BASOS PCT: 1 %
Basophils Absolute: 0 10*3/uL (ref 0–0.1)
EOS ABS: 0 10*3/uL (ref 0–0.7)
Eosinophils Relative: 1 %
HCT: 30.9 % — ABNORMAL LOW (ref 40.0–52.0)
HEMOGLOBIN: 10.5 g/dL — AB (ref 13.0–18.0)
Lymphocytes Relative: 29 %
Lymphs Abs: 1.3 10*3/uL (ref 1.0–3.6)
MCH: 28.9 pg (ref 26.0–34.0)
MCHC: 34.2 g/dL (ref 32.0–36.0)
MCV: 84.6 fL (ref 80.0–100.0)
Monocytes Absolute: 0.5 10*3/uL (ref 0.2–1.0)
Monocytes Relative: 11 %
NEUTROS PCT: 58 %
Neutro Abs: 2.6 10*3/uL (ref 1.4–6.5)
Platelets: 153 10*3/uL (ref 150–440)
RBC: 3.65 MIL/uL — AB (ref 4.40–5.90)
RDW: 15.2 % — ABNORMAL HIGH (ref 11.5–14.5)
WBC: 4.4 10*3/uL (ref 3.8–10.6)

## 2015-08-28 MED ORDER — POTASSIUM CHLORIDE 2 MEQ/ML IV SOLN
Freq: Once | INTRAVENOUS | Status: AC
  Administered 2015-08-28: 11:00:00 via INTRAVENOUS
  Filled 2015-08-28: qty 1000

## 2015-08-28 MED ORDER — PALONOSETRON HCL INJECTION 0.25 MG/5ML
0.2500 mg | Freq: Once | INTRAVENOUS | Status: AC
  Administered 2015-08-28: 0.25 mg via INTRAVENOUS
  Filled 2015-08-28: qty 5

## 2015-08-28 MED ORDER — SODIUM CHLORIDE 0.9 % IV SOLN
35.0000 mg/m2 | Freq: Once | INTRAVENOUS | Status: AC
  Administered 2015-08-28: 75 mg via INTRAVENOUS
  Filled 2015-08-28: qty 75

## 2015-08-28 MED ORDER — SODIUM CHLORIDE 0.9 % IV SOLN
1000.0000 mg/m2 | Freq: Once | INTRAVENOUS | Status: AC
  Administered 2015-08-28: 2128 mg via INTRAVENOUS
  Filled 2015-08-28: qty 50.71

## 2015-08-28 MED ORDER — HEPARIN SOD (PORK) LOCK FLUSH 100 UNIT/ML IV SOLN
500.0000 [IU] | Freq: Once | INTRAVENOUS | Status: AC | PRN
  Administered 2015-08-28: 500 [IU]
  Filled 2015-08-28: qty 5

## 2015-08-28 MED ORDER — SODIUM CHLORIDE 0.9 % IV SOLN
Freq: Once | INTRAVENOUS | Status: AC
  Administered 2015-08-28: 13:00:00 via INTRAVENOUS
  Filled 2015-08-28: qty 5

## 2015-08-28 MED ORDER — SODIUM CHLORIDE 0.9 % IJ SOLN
10.0000 mL | INTRAMUSCULAR | Status: DC | PRN
  Administered 2015-08-28 (×2): 10 mL
  Filled 2015-08-28: qty 10

## 2015-08-28 MED ORDER — SODIUM CHLORIDE 0.9 % IV SOLN
Freq: Once | INTRAVENOUS | Status: AC
  Administered 2015-08-28: 11:00:00 via INTRAVENOUS
  Filled 2015-08-28: qty 1000

## 2015-08-28 NOTE — Progress Notes (Signed)
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 Patient reports pain is 2/10 today, denies any concerns.

## 2015-08-29 ENCOUNTER — Encounter: Payer: Self-pay | Admitting: *Deleted

## 2015-08-29 DIAGNOSIS — I214 Non-ST elevation (NSTEMI) myocardial infarction: Secondary | ICD-10-CM

## 2015-08-29 NOTE — Progress Notes (Signed)
Cardiac Individual Treatment Plan  Patient Details  Name: Louis Alexander MRN: [id number] Date of Birth: 04-Jan-1941 Referring Provider:  No ref. provider found  Initial Encounter Date:    Visit Diagnosis: No diagnosis found.  Patient's Home Medications on Admission:  Current outpatient prescriptions:  .  aspirin EC 81 MG tablet, Take 81 mg by mouth daily., Disp: , Rfl:  .  atenolol (TENORMIN) 25 MG tablet, Take 1 tablet by mouth daily., Disp: , Rfl:  .  atorvastatin (LIPITOR) 40 MG tablet, Take 40 mg by mouth every evening., Disp: , Rfl:  .  canagliflozin (INVOKANA) 100 MG TABS tablet, Take 1 tablet by mouth every morning., Disp: , Rfl:  .  clopidogrel (PLAVIX) 75 MG tablet, Take 75 mg by mouth daily with breakfast., Disp: , Rfl:  .  Coenzyme Q10 10 MG capsule, Take 10 mg by mouth., Disp: , Rfl:  .  docusate sodium 100 MG CAPS, Take 100 mg by mouth 2 (two) times daily., Disp: 10 capsule, Rfl: 0 .  fentaNYL (DURAGESIC - DOSED MCG/HR) 75 MCG/HR, Place 1 patch (75 mcg total) onto the skin every 3 (three) days., Disp: 10 patch, Rfl: 0 .  Fluticasone Furoate-Vilanterol (BREO ELLIPTA) 100-25 MCG/INH AEPB, Inhale 1 Inhaler into the lungs daily., Disp: , Rfl:  .  furosemide (LASIX) 20 MG tablet, Take 20 mg by mouth 2 (two) times daily., Disp: , Rfl:  .  glipiZIDE (GLUCOTROL) 10 MG tablet, TAKE 1 TABLET(S) BY MOUTH DAILY, Disp: 30 tablet, Rfl: 6 .  losartan (COZAAR) 100 MG tablet, Take 1 tablet by mouth daily., Disp: , Rfl:  .  metFORMIN (GLUCOPHAGE) 500 MG tablet, Take 500 mg by mouth 2 (two) times daily with a meal., Disp: , Rfl:  .  metFORMIN (GLUCOPHAGE-XR) 500 MG 24 hr tablet, Take 2 tablets by mouth 2 (two) times daily., Disp: , Rfl:  .  Multiple Vitamin (MULTIVITAMIN WITH MINERALS) TABS tablet, Take 1 tablet by mouth daily., Disp: , Rfl:  .  naproxen sodium (ALEVE) 220 MG tablet, Take 1 tablet by mouth daily as needed., Disp: , Rfl:  .  ticagrelor (BRILINTA) 90 MG TABS tablet, Take by  mouth 2 (two) times daily., Disp: , Rfl:  No current facility-administered medications for this visit.  Facility-Administered Medications Ordered in Other Visits:  .  Shower Chin To Toes With 60 mL chlorhexidine (HIBICLENS) the night before surgery, , , Once **AND** Shower Chin To Toes With 60 mL chlorhexidine (HIBICLENS) in AM of surgery after pre-op clip completed, , , Once **AND** chlorhexidine (HIBICLENS) 4 % liquid 4 application, 60 mL, Topical, Once **AND** chlorhexidine (HIBICLENS) 4 % liquid 4 application, 60 mL, Topical, Once, [employer], PA-C .  sodium chloride 0.9 % injection 10 mL, 10 mL, Intracatheter, PRN, Lloyd Huger, MD, 10 mL at 05/09/15 0900 .  sodium chloride 0.9 % injection 3 mL, 3 mL, Intravenous, PRN, Lloyd Huger, MD  Past Medical History: Past Medical History  Diagnosis Date  . Abnormal EKG   . Coronary artery disease   . Diabetes mellitus without complication   . Obesity   . Hyperlipidemia   . Hypertension   . 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 Sleep apnea     could not tolerate cpap mask  . Arthritis     oa  . Cancer     bladder and prostate  . History of radiation therapy 2012  . History of chemotherapy 2014  . Bladder  cancer   . History of chicken pox     Tobacco Use: History  Smoking status  . Never Smoker   Smokeless tobacco  . Never Used    Labs: Recent Review Flowsheet Data    Labs for ITP Cardiac and Pulmonary Rehab Latest Ref Rng 10/26/2012 12/22/2014 06/12/2015   Cholestrol 0 - 200 mg/dL 134 189 -   LDLCALC - 41 115 -   HDL 35 - 70 mg/dL 36(L) 41 -   Trlycerides 40 - 160 mg/dL 284(H) 165(A) -   Hemoglobin A1c - - 7.2(A) 7.7       Exercise Target Goals:    Exercise Program Goal: Individual exercise prescription set with THRR, safety & activity barriers. Participant demonstrates ability to understand and report RPE using BORG scale, to self-measure pulse accurately, and to acknowledge the importance of  the exercise prescription.  Exercise Prescription Goal: Starting with aerobic activity 30 plus minutes a day, 3 days per week for initial exercise prescription. Provide home exercise prescription and guidelines that participant acknowledges understanding prior to discharge.  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   urostomy tube    Risk Stratification High      6 Minute Walk:     6 Minute Walk      07/31/15 1140       6 Minute Walk   Phase Initial     Distance 850 feet     Walk Time 6 minutes     Resting HR 76 bpm     Resting BP 124/70 mmHg     Max Ex. HR 94 bpm     Max Ex. BP 140/66 mmHg     RPE 12     Symptoms No        Initial Exercise Prescription:     Initial Exercise Prescription - 07/31/15 1100    Date of Initial Exercise Prescription   Date 07/31/15   Treadmill   MPH 1.6   Grade 0   Minutes 10   Bike   Level 0.4   Minutes 10   Recumbant Bike   Level 3   RPM 40   Watts 25   Minutes 10   NuStep   Level 2   Watts 35   Minutes 15   Arm Ergometer   Level 1   Watts 10   Minutes 10   Arm/Foot Ergometer   Level 4   Watts 12   Minutes 10   Cybex   Level 3   RPM 50   Minutes 10   Recumbant Elliptical   Level 1   RPM 40   Watts 10   Minutes 10   Elliptical   Level 1   Speed 3   Minutes 1   REL-XR   Level 2   Watts 30   Minutes 10   Prescription Details   Frequency (times per week) 3   Duration Progress to 30 minutes of continuous aerobic without signs/symptoms of physical distress   Intensity   THRR REST +  30   Ratings of Perceived Exertion 11-15   Progression Continue progressive overload as per policy without signs/symptoms or physical distress.   Resistance Training   Training Prescription Yes   Weight 2   Reps 10-15  Exercise Prescription Changes:     Exercise Prescription Changes      08/21/15 0700 08/22/15 0700         Exercise Review   Progression --  Only two visits recorded No  Only 2 visits recorded and will be out for several weeks      Response to Exercise   Blood Pressure (Admit)  140/80 mmHg      Blood Pressure (Exercise)  168/82 mmHg      Blood Pressure (Exit)  134/72 mmHg      Heart Rate (Admit)  71 bpm      Heart Rate (Exercise)  95 bpm      Heart Rate (Exit)  75 bpm      Rating of Perceived Exertion (Exercise)  12      Symptoms  Tired      Comments  Louis Alexander is going to be out for several weeks due to the cancer that he also has. His workloads may need to be reevaluated upon his return.      Duration  Progress to 30 minutes of continuous aerobic without signs/symptoms of physical distress      Intensity  Rest + 30      Progression  Continue progressive overload as per policy without signs/symptoms or physical distress.      Resistance Training   Training Prescription  Yes      Weight  2      Reps  10-15      Interval Training   Interval Training  No      Treadmill   MPH  1.5      Grade  0      Minutes  10      REL-XR   Level  2      Watts  40      Minutes  15         Discharge Exercise Prescription (Final Exercise Prescription Changes):     Exercise Prescription Changes - 08/22/15 0700    Exercise Review   Progression No  Only 2 visits recorded and will be out for several weeks   Response to Exercise   Blood Pressure (Admit) 140/80 mmHg   Blood Pressure (Exercise) 168/82 mmHg   Blood Pressure (Exit) 134/72 mmHg   Heart Rate (Admit) 71 bpm   Heart Rate (Exercise) 95 bpm   Heart Rate (Exit) 75 bpm   Rating of Perceived Exertion (Exercise) 12   Symptoms Tired   Comments Louis Alexander is going to be out for several weeks due to the cancer that he also has. His workloads may need to be reevaluated upon his return.   Duration Progress to 30 minutes of continuous aerobic  without signs/symptoms of physical distress   Intensity Rest + 30   Progression Continue progressive overload as per policy without signs/symptoms or physical distress.   Resistance Training   Training Prescription Yes   Weight 2   Reps 10-15   Interval Training   Interval Training No   Treadmill   MPH 1.5   Grade 0   Minutes 10   REL-XR   Level 2   Watts 40   Minutes 15      Nutrition:  Target Goals: Understanding of nutrition guidelines, daily intake of sodium 1500mg , cholesterol 200mg , calories 30% from fat and 7% or less from saturated fats, daily to have 5 or more servings of fruits and vegetables.  Biometrics:     Pre Biometrics - 07/31/15 1136  Pre Biometrics   Height 5' 10.5" (1.791 m)   Weight 203 lb 4.8 oz (92.216 kg)   Waist Circumference 40.25 inches   Hip Circumference 44.5 inches   Waist to Hip Ratio 0.9 %   BMI (Calculated) 28.8       Nutrition Therapy Plan and Nutrition Goals:   Nutrition Discharge: Rate Your Plate Scores:   Nutrition Goals Re-Evaluation:     Nutrition Goals Re-Evaluation      08/23/15 1536           Personal Goal #1 Re-Evaluation   Personal Goal #1 Louis Alexander has been out of Cardiac Rehab since 08/06/2015 since he told Louis Alexander that "exercising is antogonizing his tumor/cancer".           Psychosocial: Target Goals: Acknowledge presence or absence of depression, maximize coping skills, provide positive support system. Participant is able to verbalize types and ability to use techniques and skills needed for reducing stress and depression.  Initial Review & Psychosocial Screening:     Initial Psych Review & Screening - 08/01/15 [place]? Yes   Screening Interventions   Interventions Encouraged to exercise;Program counselor consult      Quality of Life Scores:     Quality of Life - 08/01/15 1208    Quality of Life Scores   Health/Function Pre 19.87 %   Socioeconomic  Pre 24.21 %   Psych/Spiritual Pre 24 %   Family Pre 24.8 %   GLOBAL Pre 22.93 %      PHQ-9:     Recent Review Flowsheet Data    Depression screen [facility] 2/9 07/31/2015 05/03/2015   Decreased Interest 0 0   Down, Depressed, Hopeless 0 0   PHQ - 2 Score 0 0   Altered sleeping 3 -   Tired, decreased energy 1 -   Change in appetite 0 -   Feeling bad or failure about yourself  0 -   Trouble concentrating 0 -   Moving slowly or fidgety/restless 1 -   Suicidal thoughts 0 -   PHQ-9 Score 5 -   Difficult doing work/chores Not difficult at all -      Psychosocial Evaluation and Intervention:   Psychosocial Re-Evaluation:     Psychosocial Re-Evaluation      08/23/15 1537 08/29/15 1157         Psychosocial Re-Evaluation   Interventions Encouraged to attend Cardiac Rehabilitation for the exercise Stress management education      Comments Many stressors including -Louis Alexander has been out of Cardiac Rehab since 08/06/2015 since he told Louis Alexander that "exercising is antogonizing his tumor/cancer".          Vocational Rehabilitation: Provide vocational rehab assistance to qualifying candidates.   Vocational Rehab Evaluation & Intervention:     Vocational Rehab - 07/31/15 1102    Initial Vocational Rehab Evaluation & Intervention   Assessment shows need for Vocational Rehabilitation No      Education: Education Goals: Education classes will be provided on a weekly basis, covering required topics. Participant will state understanding/return demonstration of topics presented.  Learning Barriers/Preferences:     Learning Barriers/Preferences - 07/31/15 1102    Learning Barriers/Preferences   Learning Barriers None   Learning Preferences None      Education Topics: General Nutrition Guidelines/Fats and Fiber: -Group instruction provided by verbal, written material, models and posters to present the general guidelines for heart healthy nutrition. Gives an explanation and  review  of dietary fats and fiber.   Controlling Sodium/Reading Food Labels: -Group verbal and written material supporting the discussion of sodium use in heart healthy nutrition. Review and explanation with models, verbal and written materials for utilization of the food label.   Exercise Physiology & Risk Factors: - Group verbal and written instruction with models to review the exercise physiology of the cardiovascular system and associated critical values. Details cardiovascular disease risk factors and the goals associated with each risk factor.   Aerobic Exercise & Resistance Training: - Gives group verbal and written discussion on the health impact of inactivity. On the components of aerobic and resistive training programs and the benefits of this training and how to safely progress through these programs.   Flexibility, Balance, General Exercise Guidelines: - Provides group verbal and written instruction on the benefits of flexibility and balance training programs. Provides general exercise guidelines with specific guidelines to those with heart or lung disease. Demonstration and skill practice provided.          Cardiac Rehab from 08/06/2015 in [facility] Cardiac Rehab   Date  08/06/15   Educator  [facility]   Instruction Review Code  2- meets goals/outcomes      Stress Management: - Provides group verbal and written instruction about the health risks of elevated stress, cause of high stress, and healthy ways to reduce stress.   Depression: - Provides group verbal and written instruction on the correlation between heart/lung disease and depressed mood, treatment options, and the stigmas associated with seeking treatment.   Anatomy & Physiology of the Heart: - Group verbal and written instruction and models provide basic cardiac anatomy and physiology, with the coronary electrical and arterial systems. Review of: AMI, Angina, Valve disease, Heart Failure, Cardiac Arrhythmia, Pacemakers, and  the ICD.   Cardiac Procedures: - Group verbal and written instruction and models to describe the testing methods done to diagnose heart disease. Reviews the outcomes of the test results. Describes the treatment choices: Medical Management, Angioplasty, or Coronary Bypass Surgery.   Cardiac Medications: - Group verbal and written instruction to review commonly prescribed medications for heart disease. Reviews the medication, class of the drug, and side effects. Includes the steps to properly store meds and maintain the prescription regimen.   Go Sex-Intimacy & Heart Disease, Get SMART - Goal Setting: - Group verbal and written instruction through game format to discuss heart disease and the return to sexual intimacy. Provides group verbal and written material to discuss and apply goal setting through the application of the S.M.A.R.T. Method.   Other Matters of the Heart: - Provides group verbal, written materials and models to describe Heart Failure, Angina, Valve Disease, and Diabetes in the realm of heart disease. Includes description of the disease process and treatment options available to the cardiac patient.   Exercise & Equipment Safety: - Individual verbal instruction and demonstration of equipment use and safety with use of the equipment.      Cardiac Rehab from 08/06/2015 in [facility] Cardiac Rehab   Date  08/06/15   Educator  RM   Instruction Review Code  2- meets goals/outcomes      Infection Prevention: - Provides verbal and written material to individual with discussion of infection control including proper hand washing and proper equipment cleaning during exercise session.      Cardiac Rehab from 08/06/2015 in [facility] Cardiac Rehab   Date  08/06/15   Educator  RM   Instruction Review Code  2- meets goals/outcomes  Falls Prevention: - Provides verbal and written material to individual with discussion of falls prevention and safety.   Diabetes: - Individual verbal and  written instruction to review signs/symptoms of diabetes, desired ranges of glucose level fasting, after meals and with exercise. Advice that pre and post exercise glucose checks will be done for 3 sessions at entry of program.      Cardiac Rehab from 08/06/2015 in [facility] Cardiac Rehab   Date  07/31/15   Educator  SB   Instruction Review Code  2- meets goals/outcomes       Knowledge Questionnaire Score:     Knowledge Questionnaire Score - 08/01/15 1209    Knowledge Questionnaire Score   Pre Score 22/28      Personal Goals and Risk Factors at Admission:     Personal Goals and Risk Factors at Admission - 07/31/15 1103    Personal Goals and Risk Factors on Admission   Increase Aerobic Exercise and Physical Activity Yes   Intervention While in program, learn and follow the exercise prescription taught. Start at a low level workload and increase workload after able to maintain previous level for 30 minutes. Increase time before increasing intensity.   Understand more about Heart/Pulmonary Disease. Yes   Intervention While in program utilize professionals for any questions, and attend the education sessions. Great websites to use are www.americanheart.org or www.lung.org for reliable information.   Diabetes Yes   Goal Blood glucose control identified by blood glucose values, HgbA1C. Participant verbalizes understanding of the signs/symptoms of hyper/hypo glycemia, proper foot care and importance of medication and nutrition plan for blood glucose control.   Intervention Provide nutrition & aerobic exercise along with prescribed medications to achieve blood glucose in normal ranges: Fasting 65-99 mg/dL   Hypertension Yes   Goal Participant will see blood pressure controlled within the values of 140/45mm/Hg or within value directed by their physician.   Intervention Provide nutrition & aerobic exercise along with prescribed medications to achieve BP 140/90 or less.   Lipids Yes   Goal  Cholesterol controlled with medications as prescribed, with individualized exercise RX and with personalized nutrition plan. Value goals: LDL < 70mg , HDL > 40mg . Participant states understanding of desired cholesterol values and following prescriptions.   Intervention Provide nutrition & aerobic exercise along with prescribed medications to achieve LDL 70mg , HDL >40mg .      Personal Goals and Risk Factors Review:      Goals and Risk Factor Review      08/23/15 1537 08/29/15 1157         Increase Aerobic Exercise and Physical Activity   Goals Progress/Improvement seen  No No      Comments Louis Alexander has been out of Cardiac Rehab since 08/06/2015 since he told Louis Alexander that "exercising is antogonizing his tumor/cancer".       Diabetes   Goal --  Louis Alexander has been out of Cardiac Rehab since 08/06/2015 since he told Louis Alexander that "exercising is antogonizing his tumor/cancer".       Hypertension   Goal --  Louis Alexander has been out of Cardiac Rehab since 08/06/2015 since he told Louis Alexander that "exercising is antogonizing his tumor/cancer".          Personal Goals Discharge (Final Personal Goals and Risk Factors Review):      Goals and Risk Factor Review - 08/29/15 1157    Increase Aerobic Exercise and Physical Activity   Goals Progress/Improvement seen  No       Comments: Stress  of being sick and missing still missing CR due to his oncology cancer issues.

## 2015-09-01 ENCOUNTER — Other Ambulatory Visit: Payer: Self-pay | Admitting: Family Medicine

## 2015-09-06 NOTE — Progress Notes (Signed)
[place]  [phone] [phone]) [phone]  ID: Louis Alexander OB: 1941-11-12  MR#: [id number]  ZHG#:[id number]  Patient Care Team: Birdie Sons, MD as PCP - General (Family Medicine) Murrell Redden, MD (Urology) Dionisio David, MD as Consulting Physician (Cardiology) Lloyd Huger, MD as Consulting Physician (Oncology)  CHIEF COMPLAINT:  Chief Complaint  Patient presents with  . urothelial cancer    follow up/chemotherapy    INTERVAL HISTORY: Patient returns to clinic for further evaluation and consideration of cycle 2, day 8 of cisplatin and gemcitabine. His pelvic and penile pain or nearly resolved. He denies any shortness of breath or chest pain.  He has no neurologic complaints. He denies any recent fevers. He denies any nausea, vomiting, constipation, or diarrhea. He has no urinary complaints. Patient offers no further specific complaints today.   REVIEW OF SYSTEMS:   Review of Systems  Constitutional: Positive for malaise/fatigue. Negative for fever.  Respiratory: Negative for shortness of breath.   Cardiovascular: Negative.  Negative for chest pain.  Gastrointestinal: Negative.   Genitourinary: Negative.   Musculoskeletal:       Pelvic pain.  Neurological: Positive for weakness.    As per HPI. Otherwise, a complete review of systems is negatve.  PAST MEDICAL HISTORY: Past Medical History  Diagnosis Date  . Abnormal EKG   . Coronary artery disease   . Diabetes mellitus without complication   . Obesity   . Hyperlipidemia   . Hypertension   . 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 Sleep apnea     could not tolerate cpap mask  . Arthritis     oa  . Cancer     bladder and prostate  . History of radiation therapy 2012  . History of chemotherapy 2014  . Bladder cancer   . History of chicken pox     PAST SURGICAL HISTORY: Past Surgical History  Procedure Laterality Date  . Cardiac catheterization  12-06-2003   . Lad stent  2000 x1 stent, 2008 x 1 stent, 2012 x 1 stent  . Hernia repair  yrs ago  . Appendectomy  many yrs ago  . Rotator cuff repair Left yrs ago  . Seed implant to prostate with radiation  2012  . Protatectomy and urostomy  2014  . Total hip arthroplasty Right 12/27/2013    Procedure: RIGHT TOTAL HIP ARTHROPLASTY ANTERIOR APPROACH;  Surgeon: Mauri Pole, MD;  Location: [facility];  Service: Orthopedics;  Laterality: Right;  . Cardiac catheterization N/A 06/04/2015    Procedure: Left Heart Cath and Coronary Angiography;  Surgeon: Corey Skains, MD;  Location: [place] [facility];  Service: Cardiovascular;  Laterality: N/A;  . Cardiac catheterization N/A 06/04/2015    Procedure: Coronary Stent Intervention;  Surgeon: Wellington Hampshire, MD;  Location: [place] [facility];  Service: Cardiovascular;  Laterality: N/A;    FAMILY HISTORY: Reviewed and unchanged. No report of malignancy or chronic disease.     ADVANCED DIRECTIVES:    HEALTH MAINTENANCE: Social History  Substance Use Topics  . Smoking status: Never Smoker   . Smokeless tobacco: Never Used  . Alcohol Use: No     Comment: occasional     Colonoscopy:  PAP:  Bone density:  Lipid panel:  Allergies  Allergen Reactions  . Celebrex [Celecoxib]     Hematuria Other reaction(s): Bleeding Bleeding in bladder  . Effient [Prasugrel] Other (See Comments)    blood clots, blood  in urine Other reaction(s): Bleeding Bleeding in bladder    Current Outpatient Prescriptions  Medication Sig Dispense Refill  . aspirin EC 81 MG tablet Take 81 mg by mouth daily.    [name] atenolol (TENORMIN) 25 MG tablet Take 1 tablet by mouth daily.    [name] atorvastatin (LIPITOR) 40 MG tablet Take 40 mg by mouth every evening.    . canagliflozin (INVOKANA) 100 MG TABS tablet Take 1 tablet by mouth every morning.    . clopidogrel (PLAVIX) 75 MG tablet Take 75 mg by mouth daily with breakfast.    . Coenzyme Q10 10 MG capsule Take 10 mg by mouth.      . docusate sodium 100 MG CAPS Take 100 mg by mouth 2 (two) times daily. 10 capsule 0  . fentaNYL (DURAGESIC - DOSED MCG/HR) 75 MCG/HR Place 1 patch (75 mcg total) onto the skin every 3 (three) days. 10 patch 0  . Fluticasone Furoate-Vilanterol (BREO ELLIPTA) 100-25 MCG/INH AEPB Inhale 1 Inhaler into the lungs daily.    . furosemide (LASIX) 20 MG tablet Take 20 mg by mouth 2 (two) times daily.    [name] glipiZIDE (GLUCOTROL) 10 MG tablet TAKE 1 TABLET(S) BY MOUTH DAILY 30 tablet 6  . losartan (COZAAR) 100 MG tablet Take 1 tablet by mouth daily.    . metFORMIN (GLUCOPHAGE) 500 MG tablet Take 500 mg by mouth 2 (two) times daily with a meal.    . metFORMIN (GLUCOPHAGE-XR) 500 MG 24 hr tablet Take 2 tablets by mouth 2 (two) times daily.    . Multiple Vitamin (MULTIVITAMIN WITH MINERALS) TABS tablet Take 1 tablet by mouth daily.    . naproxen sodium (ALEVE) 220 MG tablet Take 1 tablet by mouth daily as needed.    . ticagrelor (BRILINTA) 90 MG TABS tablet Take by mouth 2 (two) times daily.    . prochlorperazine (COMPAZINE) 10 MG tablet TAKE 1 TABLET BY MOUTH EVERY 6 HOURS AS NEEDED 40 tablet 1   No current facility-administered medications for this visit.   Facility-Administered Medications Ordered in Other Visits  Medication Dose Route Frequency Provider Last Rate Last Dose  . chlorhexidine (HIBICLENS) 4 % liquid 4 application  60 mL Topical Once Danae Orleans, PA-C       And  . chlorhexidine (HIBICLENS) 4 % liquid 4 application  60 mL Topical Once Danae Orleans, PA-C      . sodium chloride 0.9 % injection 10 mL  10 mL Intracatheter PRN Lloyd Huger, MD   10 mL at 05/09/15 0900  . sodium chloride 0.9 % injection 3 mL  3 mL Intravenous PRN Lloyd Huger, MD        OBJECTIVE: Filed Vitals:   08/28/15 0930  BP: 167/88  Pulse: 90  Temp: 96.9 F (36.1 C)  Resp: 20     Body mass index is 28.18 kg/(m^2).    ECOG FS:1 - Symptomatic but completely ambulatory  General: Well-developed,  well-nourished, no acute distress. Eyes: anicteric sclera. Lungs: Clear to auscultation bilaterally. Heart: Regular rate and rhythm. No rubs, murmurs, or gallops. Abdomen: Soft, nontender, nondistended. No organomegaly noted, normoactive bowel sounds. Musculoskeletal: No edema, cyanosis, or clubbing. Neuro: Alert, answering all questions appropriately. Cranial nerves grossly intact. Skin: No rashes or petechiae noted. Psych: Normal affect.    LAB RESULTS:  Lab Results  Component Value Date   NA 133* 08/28/2015   K 4.3 08/28/2015   CL 102 08/28/2015   CO2 20* 08/28/2015   GLUCOSE 271*  08/28/2015   BUN 26* 08/28/2015   CREATININE 1.19 08/28/2015   CALCIUM 8.4* 08/28/2015   PROT 6.6 08/28/2015   ALBUMIN 3.5 08/28/2015   AST 27 08/28/2015   ALT 17 08/28/2015   ALKPHOS 89 08/28/2015   BILITOT 0.7 08/28/2015   GFRNONAA 59* 08/28/2015   GFRAA >60 08/28/2015    Lab Results  Component Value Date   WBC 4.4 08/28/2015   NEUTROABS 2.6 08/28/2015   HGB 10.5* 08/28/2015   HCT 30.9* 08/28/2015   MCV 84.6 08/28/2015   PLT 153 08/28/2015     STUDIES: No results found.  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  PLAN:    1.  Urothelial carcinoma: Patient's pain is likely secondary to progressive disease. PET scan results reviewed independently with obvious progression of disease. Proceed with cycle 2, day 8 of cisplatin and gemcitabine. Patient will receive this regimen on days 1 and 8 with day 15 off. Plan to reimage after 3 or 4 cycles. Return to clinic in 2 weeks for consideration of cycle 3, day 1.  If patient progresses within one year, continue consider newly approved atezolizumab.   2. Pain: Nearly resolved. Secondary to malignancy. Patient previously had XRT to his pelvic area. Continue current narcotic regimen as prescribed. 3. Hyperglycemia: Patient is diabetic. Continue glipizide and metformin as prescribed. Monitor. 4. Anemia: Mild,  monitor.  Patient expressed understanding and was in agreement with this plan. He also understands that He can call clinic at any time with any questions, concerns, or complaints.   Urothelial cancer   Staging form: Kidney, AJCC 7th Edition     Clinical stage from 03/16/2015: Stage IV (TX, N1, M1) - Signed by Lloyd Huger, MD on 03/16/2015   Lloyd Huger, MD   09/06/2015 10:58 PM

## 2015-09-06 NOTE — Progress Notes (Signed)
[place]  [phone] [phone]) [phone]  ID: [name] OB: [birth year]  MR#: [id number]  EYC#:[id number]  Patient Care Team: Birdie Sons, MD as PCP - General (Family Medicine) Murrell Redden, MD (Urology) Dionisio David, MD as Consulting Physician (Cardiology) Lloyd Huger, MD as Consulting Physician (Oncology)  CHIEF COMPLAINT:  Chief Complaint  Patient presents with  . urothelial cancer    INTERVAL HISTORY: Patient returns to clinic for further evaluation and consideration of cycle 2, day 1 of cisplatin and gemcitabine. His pelvic and penile pain continues to improve, but are still evident. He no longer is having shortness of breath or chest pain.  He has no neurologic complaints. He denies any recent fevers. He denies any nausea, vomiting, constipation, or diarrhea. He has no urinary complaints. Patient offers no further specific complaints today.   REVIEW OF SYSTEMS:   Review of Systems  Constitutional: Positive for malaise/fatigue.  Respiratory: Negative for shortness of breath.   Cardiovascular: Negative.  Negative for chest pain.  Musculoskeletal:       Pelvic pain.  Neurological: Positive for weakness.    As per HPI. Otherwise, a complete review of systems is negatve.  PAST MEDICAL HISTORY: Past Medical History  Diagnosis Date  . Abnormal EKG   . Coronary artery disease   . Diabetes mellitus without complication   . Obesity   . Hyperlipidemia   . Hypertension   . 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 Sleep apnea     could not tolerate cpap mask  . Arthritis     oa  . Cancer     bladder and prostate  . History of radiation therapy 2012  . History of chemotherapy 2014  . Bladder cancer   . History of chicken pox     PAST SURGICAL HISTORY: Past Surgical History  Procedure Laterality Date  . Cardiac catheterization  12-06-2003  . Lad stent  2000 x1 stent, 2008 x 1 stent, 2012 x 1 stent  . Hernia  repair  yrs ago  . Appendectomy  many yrs ago  . Rotator cuff repair Left yrs ago  . Seed implant to prostate with radiation  2012  . Protatectomy and urostomy  2014  . Total hip arthroplasty Right 12/27/2013    Procedure: RIGHT TOTAL HIP ARTHROPLASTY ANTERIOR APPROACH;  Surgeon: Mauri Pole, MD;  Location: [facility];  Service: Orthopedics;  Laterality: Right;  . Cardiac catheterization N/A 06/04/2015    Procedure: Left Heart Cath and Coronary Angiography;  Surgeon: Corey Skains, MD;  Location: [place] [facility];  Service: Cardiovascular;  Laterality: N/A;  . Cardiac catheterization N/A 06/04/2015    Procedure: Coronary Stent Intervention;  Surgeon: Wellington Hampshire, MD;  Location: [place] [facility];  Service: Cardiovascular;  Laterality: N/A;    FAMILY HISTORY: Reviewed and unchanged. No report of malignancy or chronic disease.     ADVANCED DIRECTIVES:    HEALTH MAINTENANCE: Social History  Substance Use Topics  . Smoking status: Never Smoker   . Smokeless tobacco: Never Used  . Alcohol Use: No     Comment: occasional     Colonoscopy:  PAP:  Bone density:  Lipid panel:  Allergies  Allergen Reactions  . Celebrex [Celecoxib]     Hematuria Other reaction(s): Bleeding Bleeding in bladder  . Effient [Prasugrel] Other (See Comments)    blood clots, blood in urine Other reaction(s): Bleeding Bleeding in bladder  Current Outpatient Prescriptions  Medication Sig Dispense Refill  . aspirin EC 81 MG tablet Take 81 mg by mouth daily.    [name] atenolol (TENORMIN) 25 MG tablet Take 1 tablet by mouth daily.    [name] atorvastatin (LIPITOR) 40 MG tablet Take 40 mg by mouth every evening.    . canagliflozin (INVOKANA) 100 MG TABS tablet Take 1 tablet by mouth every morning.    . clopidogrel (PLAVIX) 75 MG tablet Take 75 mg by mouth daily with breakfast.    . Coenzyme Q10 10 MG capsule Take 10 mg by mouth.    . docusate sodium 100 MG CAPS Take 100 mg by mouth 2 (two) times daily.  10 capsule 0  . fentaNYL (DURAGESIC - DOSED MCG/HR) 75 MCG/HR Place 1 patch (75 mcg total) onto the skin every 3 (three) days. 10 patch 0  . Fluticasone Furoate-Vilanterol (BREO ELLIPTA) 100-25 MCG/INH AEPB Inhale 1 Inhaler into the lungs daily.    . furosemide (LASIX) 20 MG tablet Take 20 mg by mouth 2 (two) times daily.    [name] glipiZIDE (GLUCOTROL) 10 MG tablet TAKE 1 TABLET(S) BY MOUTH DAILY 30 tablet 6  . losartan (COZAAR) 100 MG tablet Take 1 tablet by mouth daily.    . metFORMIN (GLUCOPHAGE) 500 MG tablet Take 500 mg by mouth 2 (two) times daily with a meal.    . metFORMIN (GLUCOPHAGE-XR) 500 MG 24 hr tablet Take 2 tablets by mouth 2 (two) times daily.    . Multiple Vitamin (MULTIVITAMIN WITH MINERALS) TABS tablet Take 1 tablet by mouth daily.    . naproxen sodium (ALEVE) 220 MG tablet Take 1 tablet by mouth daily as needed.    . ticagrelor (BRILINTA) 90 MG TABS tablet Take by mouth 2 (two) times daily.    . prochlorperazine (COMPAZINE) 10 MG tablet TAKE 1 TABLET BY MOUTH EVERY 6 HOURS AS NEEDED 40 tablet 1   No current facility-administered medications for this visit.   Facility-Administered Medications Ordered in Other Visits  Medication Dose Route Frequency Provider Last Rate Last Dose  . chlorhexidine (HIBICLENS) 4 % liquid 4 application  60 mL Topical Once Danae Orleans, PA-C       And  . chlorhexidine (HIBICLENS) 4 % liquid 4 application  60 mL Topical Once Danae Orleans, PA-C      . sodium chloride 0.9 % injection 10 mL  10 mL Intracatheter PRN Lloyd Huger, MD   10 mL at 05/09/15 0900  . sodium chloride 0.9 % injection 3 mL  3 mL Intravenous PRN Lloyd Huger, MD        OBJECTIVE: Filed Vitals:   08/21/15 0903  BP: 131/75  Pulse: 71  Temp: 96.6 F (35.9 C)  Resp: 18     Body mass index is 28.59 kg/(m^2).    ECOG FS:1 - Symptomatic but completely ambulatory  General: Well-developed, well-nourished, no acute distress. Eyes: anicteric sclera. Lungs: Clear to  auscultation bilaterally. Heart: Regular rate and rhythm. No rubs, murmurs, or gallops. Abdomen: Soft, nontender, nondistended. No organomegaly noted, normoactive bowel sounds. Musculoskeletal: No edema, cyanosis, or clubbing. Neuro: Alert, answering all questions appropriately. Cranial nerves grossly intact. Skin: No rashes or petechiae noted. Psych: Normal affect.    LAB RESULTS:  Lab Results  Component Value Date   NA 133* 08/28/2015   K 4.3 08/28/2015   CL 102 08/28/2015   CO2 20* 08/28/2015   GLUCOSE 271* 08/28/2015   BUN 26* 08/28/2015   CREATININE 1.19 08/28/2015  CALCIUM 8.4* 08/28/2015   PROT 6.6 08/28/2015   ALBUMIN 3.5 08/28/2015   AST 27 08/28/2015   ALT 17 08/28/2015   ALKPHOS 89 08/28/2015   BILITOT 0.7 08/28/2015   GFRNONAA 59* 08/28/2015   GFRAA >60 08/28/2015    Lab Results  Component Value Date   WBC 4.4 08/28/2015   NEUTROABS 2.6 08/28/2015   HGB 10.5* 08/28/2015   HCT 30.9* 08/28/2015   MCV 84.6 08/28/2015   PLT 153 08/28/2015     STUDIES: No results found.  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  PLAN:    1.  Urothelial carcinoma: Patient's recurrent pain is likely secondary to progressive disease. PET scan results reviewed independently with obvious progression of disease. Proceed with cycle 2, day 1 of cisplatin and gemcitabine. Patient will receive this regimen on days 1 and 8 with day 15 off. Plan to reimage after 3 or 4 cycles. Return to clinic in 1 week for consideration of cycle 2, day 8.  If patient progresses within one year, continue consider newly approved atezolizumab.   2. Pain: Improving.  Secondary to malignancy, patient has now completed XRT. Continue current narcotic regimen as prescribed. 3. Hyperglycemia: Patient is diabetic. Continue glipizide and metformin as prescribed. Monitor. 4. Anemia: Mild, monitor.  Patient expressed understanding and was in agreement with this plan. He also  understands that He can call clinic at any time with any questions, concerns, or complaints.   Urothelial cancer   Staging form: Kidney, AJCC 7th Edition     Clinical stage from 03/16/2015: Stage IV (TX, N1, M1) - Signed by Lloyd Huger, MD on 03/16/2015   Lloyd Huger, MD   09/06/2015 10:56 PM

## 2015-09-11 ENCOUNTER — Inpatient Hospital Stay: Payer: PPO

## 2015-09-11 ENCOUNTER — Inpatient Hospital Stay (HOSPITAL_BASED_OUTPATIENT_CLINIC_OR_DEPARTMENT_OTHER): Payer: PPO | Admitting: Oncology

## 2015-09-11 VITALS — BP 142/81 | HR 89 | Temp 96.6°F | Resp 20 | Wt 198.0 lb

## 2015-09-11 DIAGNOSIS — I2510 Atherosclerotic heart disease of native coronary artery without angina pectoris: Secondary | ICD-10-CM

## 2015-09-11 DIAGNOSIS — D649 Anemia, unspecified: Secondary | ICD-10-CM | POA: Diagnosis not present

## 2015-09-11 DIAGNOSIS — R531 Weakness: Secondary | ICD-10-CM

## 2015-09-11 DIAGNOSIS — C787 Secondary malignant neoplasm of liver and intrahepatic bile duct: Secondary | ICD-10-CM

## 2015-09-11 DIAGNOSIS — G4730 Sleep apnea, unspecified: Secondary | ICD-10-CM

## 2015-09-11 DIAGNOSIS — Z794 Long term (current) use of insulin: Secondary | ICD-10-CM

## 2015-09-11 DIAGNOSIS — C689 Malignant neoplasm of urinary organ, unspecified: Secondary | ICD-10-CM

## 2015-09-11 DIAGNOSIS — R5383 Other fatigue: Secondary | ICD-10-CM

## 2015-09-11 DIAGNOSIS — I10 Essential (primary) hypertension: Secondary | ICD-10-CM

## 2015-09-11 DIAGNOSIS — Z79899 Other long term (current) drug therapy: Secondary | ICD-10-CM

## 2015-09-11 DIAGNOSIS — E1165 Type 2 diabetes mellitus with hyperglycemia: Secondary | ICD-10-CM | POA: Diagnosis not present

## 2015-09-11 DIAGNOSIS — Z7982 Long term (current) use of aspirin: Secondary | ICD-10-CM

## 2015-09-11 DIAGNOSIS — E669 Obesity, unspecified: Secondary | ICD-10-CM

## 2015-09-11 DIAGNOSIS — Z923 Personal history of irradiation: Secondary | ICD-10-CM

## 2015-09-11 DIAGNOSIS — E785 Hyperlipidemia, unspecified: Secondary | ICD-10-CM

## 2015-09-11 DIAGNOSIS — M129 Arthropathy, unspecified: Secondary | ICD-10-CM

## 2015-09-11 DIAGNOSIS — I252 Old myocardial infarction: Secondary | ICD-10-CM

## 2015-09-11 DIAGNOSIS — Z8546 Personal history of malignant neoplasm of prostate: Secondary | ICD-10-CM

## 2015-09-11 DIAGNOSIS — Z5111 Encounter for antineoplastic chemotherapy: Secondary | ICD-10-CM | POA: Diagnosis not present

## 2015-09-11 LAB — CBC WITH DIFFERENTIAL/PLATELET
BASOS ABS: 0 10*3/uL (ref 0–0.1)
Basophils Relative: 1 %
EOS ABS: 0.1 10*3/uL (ref 0–0.7)
Eosinophils Relative: 1 %
HCT: 30.5 % — ABNORMAL LOW (ref 40.0–52.0)
HEMOGLOBIN: 10.2 g/dL — AB (ref 13.0–18.0)
Lymphocytes Relative: 11 %
Lymphs Abs: 1 10*3/uL (ref 1.0–3.6)
MCH: 28.7 pg (ref 26.0–34.0)
MCHC: 33.4 g/dL (ref 32.0–36.0)
MCV: 85.9 fL (ref 80.0–100.0)
MONOS PCT: 15 %
Monocytes Absolute: 1.4 10*3/uL — ABNORMAL HIGH (ref 0.2–1.0)
NEUTROS ABS: 6.7 10*3/uL — AB (ref 1.4–6.5)
NEUTROS PCT: 72 %
Platelets: 218 10*3/uL (ref 150–440)
RBC: 3.55 MIL/uL — ABNORMAL LOW (ref 4.40–5.90)
RDW: 16.2 % — ABNORMAL HIGH (ref 11.5–14.5)
WBC: 9.3 10*3/uL (ref 4.0–10.5)

## 2015-09-11 LAB — COMPREHENSIVE METABOLIC PANEL
ALBUMIN: 3.3 g/dL — AB (ref 3.5–5.0)
ALK PHOS: 81 U/L (ref 38–126)
ALT: 15 U/L — ABNORMAL LOW (ref 17–63)
ANION GAP: 11 (ref 5–15)
AST: 23 U/L (ref 15–41)
BUN: 28 mg/dL — AB (ref 6–20)
CALCIUM: 8.2 mg/dL — AB (ref 8.9–10.3)
CO2: 18 mmol/L — AB (ref 22–32)
Chloride: 100 mmol/L — ABNORMAL LOW (ref 101–111)
Creatinine, Ser: 1.43 mg/dL — ABNORMAL HIGH (ref 0.61–1.24)
GFR calc Af Amer: 55 mL/min — ABNORMAL LOW (ref >60)
GFR calc non Af Amer: 47 mL/min — ABNORMAL LOW (ref >60)
GLUCOSE: 358 mg/dL — AB (ref 65–99)
POTASSIUM: 4.7 mmol/L (ref 3.5–5.1)
SODIUM: 129 mmol/L — AB (ref 135–145)
Total Bilirubin: 0.7 mg/dL (ref 0.3–1.2)
Total Protein: 6.6 g/dL (ref 6.5–8.1)

## 2015-09-11 MED ORDER — FENTANYL 75 MCG/HR TD PT72: 75.0000 ug | MEDICATED_PATCH | TRANSDERMAL | Status: DC

## 2015-09-11 MED ORDER — SODIUM CHLORIDE 0.9 % IJ SOLN
10.0000 mL | INTRAMUSCULAR | Status: DC | PRN
  Administered 2015-09-11: 10 mL
  Filled 2015-09-11: qty 10

## 2015-09-11 MED ORDER — HEPARIN SOD (PORK) LOCK FLUSH 100 UNIT/ML IV SOLN
500.0000 [IU] | Freq: Once | INTRAVENOUS | Status: AC | PRN
  Administered 2015-09-11: 500 [IU]
  Filled 2015-09-11: qty 5

## 2015-09-11 NOTE — Progress Notes (Signed)
Patient here today for ongoing follow up and treatment consideration regarding bladder cancer. Patient reports weakness today, states he was at cardiologist yesterday and found to be in atrial fibrillation. Patient was started on Sotalol.

## 2015-09-12 ENCOUNTER — Ambulatory Visit (INDEPENDENT_AMBULATORY_CARE_PROVIDER_SITE_OTHER): Payer: PPO | Admitting: Family Medicine

## 2015-09-12 ENCOUNTER — Encounter: Payer: Self-pay | Admitting: Family Medicine

## 2015-09-12 VITALS — BP 110/62 | HR 77 | Temp 98.2°F | Resp 18 | Wt 199.0 lb

## 2015-09-12 DIAGNOSIS — I252 Old myocardial infarction: Secondary | ICD-10-CM

## 2015-09-12 DIAGNOSIS — IMO0001 Reserved for inherently not codable concepts without codable children: Secondary | ICD-10-CM

## 2015-09-12 DIAGNOSIS — E1165 Type 2 diabetes mellitus with hyperglycemia: Secondary | ICD-10-CM

## 2015-09-12 DIAGNOSIS — I4891 Unspecified atrial fibrillation: Secondary | ICD-10-CM | POA: Diagnosis not present

## 2015-09-12 LAB — POCT GLYCOSYLATED HEMOGLOBIN (HGB A1C)
Est. average glucose Bld gHb Est-mCnc: 217
HEMOGLOBIN A1C: 9.2

## 2015-09-12 MED ORDER — CANAGLIFLOZIN 300 MG PO TABS: 300.0000 mg | ORAL_TABLET | Freq: Every day | ORAL | Status: DC

## 2015-09-12 NOTE — Progress Notes (Signed)
Patient: Louis Alexander Male    DOB: 1941-04-23   74 y.o.   MRN: [id number] Visit Date: 09/12/2015  Today's Provider: Lelon Huh, MD   Chief Complaint  Patient presents with  . Diabetes    follow up  . Hypertension    follow up  . Coronary Artery Disease    follow up   Subjective:    HPI   Diabetes Mellitus Type II, Follow-up:   Lab Results  Component Value Date   HGBA1C 7.7 06/12/2015   HGBA1C 7.2* 12/22/2014    Last seen for diabetes 3 months ago.  Management since then includes none. He reports good compliance with treatment. He is not having side effects.  Current symptoms include none and have been stable. Home blood sugar records: fasting range: 150-160. This morning his blood sugar was high at 202.   Episodes of hypoglycemia? no   Current Insulin Regimen: none Most Recent Eye Exam: <1 year Weight trend: stable Prior visit with dietician: no Current diet: in general, a "healthy" diet   Current exercise: none  Pertinent Labs:    Component Value Date/Time   CHOL 189 12/22/2014   CHOL 134 10/26/2012 0131   TRIG 165* 12/22/2014   TRIG 284* 10/26/2012 0131   CREATININE 1.43* 09/11/2015 0908   CREATININE 0.96 03/15/2015 0930   CREATININE 1.0 12/22/2014    Wt Readings from Last 3 Encounters:  09/11/15 198 lb (89.812 kg)  08/28/15 199 lb 4.7 oz (90.399 kg)  08/21/15 202 lb 2.6 oz (91.7 kg)    ------------------------------------------------------------------------   Hypertension, follow-up:  BP Readings from Last 3 Encounters:  09/11/15 142/81  08/28/15 167/88  08/21/15 131/75    He was last seen for hypertension 8 months ago.  Since last visit patient was seen by Dr. Humphrey Rolls on Monday 09/10/2015 who discontinued Atenolol and started an new medication due to rapid heart rate. Patient does not know the name of the new medication.  BP at that visit was 120/64. Management since that visit includes none. He reports good compliance with  treatment. He is not having side effects.  He is not exercising. He is adherent to low salt diet.   Outside blood pressures are normal per patient report. He is experiencing none.  Patient denies chest pain, chest pressure/discomfort, claudication, dyspnea, exertional chest pressure/discomfort, irregular heart beat, near-syncope and orthopnea.   Cardiovascular risk factors include advanced age (older than 55 for men, 53 for women), diabetes mellitus, hypertension and male gender.  Use of agents associated with hypertension: NSAIDS.     Weight trend: stable Wt Readings from Last 3 Encounters:  09/11/15 198 lb (89.812 kg)  08/28/15 199 lb 4.7 oz (90.399 kg)  08/21/15 202 lb 2.6 oz (91.7 kg)    Current diet: in general, a "healthy" diet    ------------------------------------------------------------------------   CAD Follow up:  Last office visit was 3 months ago and no changes were made. He states he was diagnosed with a-fib by Dr. Humphrey Rolls last week who changed atenolol to sotalol. He states he had been feeling very short of breath and tired for several week, but has been feeling much better since changing to sotalol. He has follow up with Dr. Humphrey Rolls next week.  He states Dr. Humphrey Rolls is keeping him on Brilinta and aspirin.     Allergies  Allergen Reactions  . Celebrex [Celecoxib]     Hematuria Other reaction(s): Bleeding Bleeding in bladder  . Effient [Prasugrel] Other (See Comments)  blood clots, blood in urine Other reaction(s): Bleeding Bleeding in bladder   Previous Medications   ASPIRIN EC 81 MG TABLET    Take 81 mg by mouth daily.   ATORVASTATIN (LIPITOR) 40 MG TABLET    Take 40 mg by mouth every evening.   CANAGLIFLOZIN (INVOKANA) 100 MG TABS TABLET    Take 1 tablet by mouth every morning.   CLOPIDOGREL (PLAVIX) 75 MG TABLET    Take 75 mg by mouth daily with breakfast.   COENZYME Q10 10 MG CAPSULE    Take 10 mg by mouth.   DOCUSATE SODIUM 100 MG CAPS    Take 100 mg by mouth  2 (two) times daily.   FENTANYL (DURAGESIC - DOSED MCG/HR) 75 MCG/HR    Place 1 patch (75 mcg total) onto the skin every 3 (three) days.   FLUTICASONE FUROATE-VILANTEROL (BREO ELLIPTA) 100-25 MCG/INH AEPB    Inhale 1 Inhaler into the lungs daily.   FUROSEMIDE (LASIX) 20 MG TABLET    Take 20 mg by mouth 2 (two) times daily.   GLIPIZIDE (GLUCOTROL) 10 MG TABLET    TAKE 1 TABLET(S) BY MOUTH DAILY   LOSARTAN (COZAAR) 100 MG TABLET    Take 1 tablet by mouth daily.   METFORMIN (GLUCOPHAGE) 500 MG TABLET    Take 500 mg by mouth 2 (two) times daily with a meal.   METFORMIN (GLUCOPHAGE-XR) 500 MG 24 HR TABLET    Take 2 tablets by mouth 2 (two) times daily.   MULTIPLE VITAMIN (MULTIVITAMIN WITH MINERALS) TABS TABLET    Take 1 tablet by mouth daily.   NAPROXEN SODIUM (ALEVE) 220 MG TABLET    Take 1 tablet by mouth daily as needed.   PROCHLORPERAZINE (COMPAZINE) 10 MG TABLET    TAKE 1 TABLET BY MOUTH EVERY 6 HOURS AS NEEDED   TICAGRELOR (BRILINTA) 90 MG TABS TABLET    Take by mouth 2 (two) times daily.    Review of Systems  Constitutional: Positive for fatigue. Negative for fever, chills, diaphoresis and appetite change.  Respiratory: Negative for chest tightness, shortness of breath and wheezing.   Cardiovascular: Positive for leg swelling. Negative for chest pain and palpitations.  Gastrointestinal: Negative for nausea, vomiting and abdominal pain.  Neurological: Positive for weakness. Negative for dizziness, light-headedness, numbness and headaches.    Social History  Substance Use Topics  . Smoking status: Never Smoker   . Smokeless tobacco: Never Used  . Alcohol Use: 0.0 oz/week    0 Standard drinks or equivalent per week     Comment: occasional   Objective:   BP 110/62 mmHg  Pulse 77  Temp(Src) 98.2 F (36.8 C) (Oral)  Resp 18  Wt 199 lb (90.266 kg)  SpO2 97%  Physical Exam   General Appearance:    Alert, cooperative, no distress  Eyes:    PERRL, conjunctiva/corneas clear, EOM's  intact       Lungs:     Clear to auscultation bilaterally, respirations unlabored  Heart:    Regular rate and rhythm, 2+ ankle edema  Neurologic:   Awake, alert, oriented x 3. No apparent focal neurological           defect.       Results for orders placed or performed in visit on 09/12/15  POCT HgB A1C  Result Value Ref Range   Hemoglobin A1C 9.2    Est. average glucose Bld gHb Est-mCnc 217        Assessment & Plan:  1. Uncontrolled type 2 diabetes mellitus without complication, without long-term current use of insulin ([place]) Is tolerating Invokana well. Will increase to 300mg  daily.   - POCT HgB A1C - canagliflozin (INVOKANA) 300 MG TABS tablet; Take 300 mg by mouth daily before breakfast.  Dispense: 30 tablet; Refill: 3  2. History of myocardial infarction Asymptomatic. Compliant with medication.  Continue aggressive risk factor modification.    3. Atrial fibrillation, unspecified type [facility]) Recently diagnosed by Dr. Humphrey Rolls. Rhythm is regular today and he is feeling better on Sotalol.  - sotalol (BETAPACE) 80 MG tablet; Take by mouth 2 (two) times daily.   Follow up 3 month to check A1c.        Lelon Huh, MD  [facility]

## 2015-09-13 DIAGNOSIS — I4891 Unspecified atrial fibrillation: Secondary | ICD-10-CM | POA: Insufficient documentation

## 2015-09-17 ENCOUNTER — Other Ambulatory Visit: Payer: Self-pay | Admitting: Family Medicine

## 2015-09-17 NOTE — Progress Notes (Signed)
[place]  Telephone:(336) [phone] Fax:(336) [phone]  ID: Louis Alexander OB: December 01, 1940  MR#: [id number]  BSJ#:[id number]  Patient Care Team: Birdie Sons, MD as PCP - General (Family Medicine) Murrell Redden, MD (Urology) Dionisio David, MD as Consulting Physician (Cardiology) Lloyd Huger, MD as Consulting Physician (Oncology)  CHIEF COMPLAINT:  Chief Complaint  Patient presents with  . Bladder Cancer    follow up  . Chemotherapy    INTERVAL HISTORY: Patient returns to clinic for further evaluation and consideration of cycle 3, day 1 of cisplatin and gemcitabine. He feels more weak and fatigued and is recently found to be in chronic A. fib. His pelvic and penile pain or nearly resolved. He denies any shortness of breath or chest pain.  He has no neurologic complaints. He denies any recent fevers. He denies any nausea, vomiting, constipation, or diarrhea. He has no urinary complaints. Patient offers no further specific complaints today.   REVIEW OF SYSTEMS:   Review of Systems  Constitutional: Positive for malaise/fatigue. Negative for fever.  Respiratory: Negative for shortness of breath.   Cardiovascular: Negative.  Negative for chest pain.  Gastrointestinal: Negative.   Genitourinary: Negative.   Musculoskeletal:       Pelvic pain.  Neurological: Positive for weakness.    As per HPI. Otherwise, a complete review of systems is negatve.  PAST MEDICAL HISTORY: Past Medical History  Diagnosis Date  . Abnormal EKG   . Coronary artery disease   . Diabetes mellitus without complication ([place])   . Obesity   . Hyperlipidemia   . Hypertension   . Myocardial infarction ([place]) 2008 and 2012    x 2  . Sleep apnea     could not tolerate cpap mask  . Arthritis     oa  . Cancer [facility])     bladder and prostate  . History of radiation therapy 2012  . History of chemotherapy 2014  . Bladder cancer ([place])   . History of chicken pox     PAST  SURGICAL HISTORY: Past Surgical History  Procedure Laterality Date  . Cardiac catheterization  12-06-2003  . Lad stent  2000 x1 stent, 2008 x 1 stent, 2012 x 1 stent  . Hernia repair  yrs ago  . Appendectomy  many yrs ago  . Rotator cuff repair Left yrs ago  . Seed implant to prostate with radiation  2012  . Protatectomy and urostomy  2014  . Total hip arthroplasty Right 12/27/2013    Procedure: RIGHT TOTAL HIP ARTHROPLASTY ANTERIOR APPROACH;  Surgeon: Mauri Pole, MD;  Location: [facility];  Service: Orthopedics;  Laterality: Right;  . Cardiac catheterization N/A 06/04/2015    Procedure: Left Heart Cath and Coronary Angiography;  Surgeon: Corey Skains, MD;  Location: [place] [facility];  Service: Cardiovascular;  Laterality: N/A;  . Cardiac catheterization N/A 06/04/2015    Procedure: Coronary Stent Intervention;  Surgeon: Wellington Hampshire, MD;  Location: [place] [facility];  Service: Cardiovascular;  Laterality: N/A;    FAMILY HISTORY: Reviewed and unchanged. No report of malignancy or chronic disease.     ADVANCED DIRECTIVES:    HEALTH MAINTENANCE: Social History  Substance Use Topics  . Smoking status: Never Smoker   . Smokeless tobacco: Never Used  . Alcohol Use: 0.0 oz/week    0 Standard drinks or equivalent per week     Comment: occasional     Colonoscopy:  PAP:  Bone density:  Lipid  panel:  Allergies  Allergen Reactions  . Celebrex [Celecoxib]     Hematuria Other reaction(s): Bleeding Bleeding in bladder  . Effient [Prasugrel] Other (See Comments)    blood clots, blood in urine Other reaction(s): Bleeding Bleeding in bladder    Current Outpatient Prescriptions  Medication Sig Dispense Refill  . aspirin EC 81 MG tablet Take 81 mg by mouth daily.    [name] atorvastatin (LIPITOR) 40 MG tablet Take 40 mg by mouth every evening.    . Coenzyme Q10 10 MG capsule Take 10 mg by mouth.    . docusate sodium 100 MG CAPS Take 100 mg by mouth 2 (two) times daily. 10  capsule 0  . fentaNYL (DURAGESIC - DOSED MCG/HR) 75 MCG/HR Place 1 patch (75 mcg total) onto the skin every 3 (three) days. 10 patch 0  . Fluticasone Furoate-Vilanterol (BREO ELLIPTA) 100-25 MCG/INH AEPB Inhale 1 Inhaler into the lungs daily.    . furosemide (LASIX) 20 MG tablet Take 20 mg by mouth 2 (two) times daily.    [name] glipiZIDE (GLUCOTROL) 10 MG tablet TAKE 1 TABLET(S) BY MOUTH DAILY 30 tablet 6  . losartan (COZAAR) 100 MG tablet Take 1 tablet by mouth daily.    . metFORMIN (GLUCOPHAGE-XR) 500 MG 24 hr tablet Take 2 tablets by mouth 2 (two) times daily.    . Multiple Vitamin (MULTIVITAMIN WITH MINERALS) TABS tablet Take 1 tablet by mouth daily.    . naproxen sodium (ALEVE) 220 MG tablet Take 1 tablet by mouth daily as needed.    . prochlorperazine (COMPAZINE) 10 MG tablet TAKE 1 TABLET BY MOUTH EVERY 6 HOURS AS NEEDED 40 tablet 1  . ticagrelor (BRILINTA) 90 MG TABS tablet Take by mouth 2 (two) times daily.    . canagliflozin (INVOKANA) 300 MG TABS tablet Take 300 mg by mouth daily before breakfast. 30 tablet 3  . sotalol (BETAPACE) 80 MG tablet Take by mouth 2 (two) times daily.     No current facility-administered medications for this visit.   Facility-Administered Medications Ordered in Other Visits  Medication Dose Route Frequency Provider Last Rate Last Dose  . chlorhexidine (HIBICLENS) 4 % liquid 4 application  60 mL Topical Once Danae Orleans, PA-C       And  . chlorhexidine (HIBICLENS) 4 % liquid 4 application  60 mL Topical Once Danae Orleans, PA-C      . sodium chloride 0.9 % injection 10 mL  10 mL Intracatheter PRN Lloyd Huger, MD   10 mL at 05/09/15 0900  . sodium chloride 0.9 % injection 3 mL  3 mL Intravenous PRN Lloyd Huger, MD        OBJECTIVE: Filed Vitals:   09/11/15 1012  BP: 142/81  Pulse: 89  Temp: 96.6 F (35.9 C)  Resp: 20     Body mass index is 28 kg/(m^2).    ECOG FS:1 - Symptomatic but completely ambulatory  General: Well-developed,  well-nourished, no acute distress. Eyes: anicteric sclera. Lungs: Clear to auscultation bilaterally. Heart: Regular rate and rhythm. No rubs, murmurs, or gallops. Abdomen: Soft, nontender, nondistended. No organomegaly noted, normoactive bowel sounds. Musculoskeletal: No edema, cyanosis, or clubbing. Neuro: Alert, answering all questions appropriately. Cranial nerves grossly intact. Skin: No rashes or petechiae noted. Psych: Normal affect.    LAB RESULTS:  Lab Results  Component Value Date   NA 129* 09/11/2015   K 4.7 09/11/2015   CL 100* 09/11/2015   CO2 18* 09/11/2015   GLUCOSE 358* 09/11/2015  BUN 28* 09/11/2015   CREATININE 1.43* 09/11/2015   CALCIUM 8.2* 09/11/2015   PROT 6.6 09/11/2015   ALBUMIN 3.3* 09/11/2015   AST 23 09/11/2015   ALT 15* 09/11/2015   ALKPHOS 81 09/11/2015   BILITOT 0.7 09/11/2015   GFRNONAA 47* 09/11/2015   GFRAA 55* 09/11/2015    Lab Results  Component Value Date   WBC 9.3 09/11/2015   NEUTROABS 6.7* 09/11/2015   HGB 10.2* 09/11/2015   HCT 30.5* 09/11/2015   MCV 85.9 09/11/2015   PLT 218 09/11/2015     STUDIES: No results found.  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  PLAN:    1.  Urothelial carcinoma: Patient's pain is likely secondary to progressive disease. PET scan results reviewed independently with obvious progression of disease. Delay cycle 3, day 1 of cisplatin and gemcitabine secondary to weakness and fatigue as well as recent diagnosis of atrial fibrillation. Patient will receive this regimen on days 1 and 8 with day 15 off. Plan to reimage after 3 or 4 cycles. Return to clinic in 1 week for reconsideration of cycle 3, day 1.  If patient progresses within one year, continue consider newly approved atezolizumab.   2. Pain: Nearly resolved. Secondary to malignancy. Patient previously had XRT to his pelvic area. Continue current narcotic regimen as prescribed. 3. Hyperglycemia: Patient is  diabetic. Continue glipizide and metformin as prescribed. Monitor. 4. Anemia: Mild, monitor. 5. Atrial fibrillation: Patient's heart is in regular rhythm today. Continue treatment and follow up with cardiology next week.  Patient expressed understanding and was in agreement with this plan. He also understands that He can call clinic at any time with any questions, concerns, or complaints.   Urothelial cancer   Staging form: Kidney, AJCC 7th Edition     Clinical stage from 03/16/2015: Stage IV (TX, N1, M1) - Signed by Lloyd Huger, MD on 03/16/2015   Lloyd Huger, MD   09/17/2015 1:45 PM

## 2015-09-18 ENCOUNTER — Inpatient Hospital Stay: Payer: PPO | Attending: Oncology

## 2015-09-18 ENCOUNTER — Inpatient Hospital Stay (HOSPITAL_BASED_OUTPATIENT_CLINIC_OR_DEPARTMENT_OTHER): Payer: PPO | Admitting: Oncology

## 2015-09-18 ENCOUNTER — Encounter: Payer: Self-pay | Admitting: *Deleted

## 2015-09-18 ENCOUNTER — Inpatient Hospital Stay: Payer: PPO

## 2015-09-18 ENCOUNTER — Encounter: Payer: Self-pay | Admitting: Oncology

## 2015-09-18 VITALS — BP 135/80 | HR 75 | Temp 96.0°F | Resp 22 | Ht 70.0 in | Wt 196.4 lb

## 2015-09-18 DIAGNOSIS — Z923 Personal history of irradiation: Secondary | ICD-10-CM | POA: Diagnosis not present

## 2015-09-18 DIAGNOSIS — M1990 Unspecified osteoarthritis, unspecified site: Secondary | ICD-10-CM

## 2015-09-18 DIAGNOSIS — Z8546 Personal history of malignant neoplasm of prostate: Secondary | ICD-10-CM | POA: Insufficient documentation

## 2015-09-18 DIAGNOSIS — Z7984 Long term (current) use of oral hypoglycemic drugs: Secondary | ICD-10-CM | POA: Insufficient documentation

## 2015-09-18 DIAGNOSIS — E1165 Type 2 diabetes mellitus with hyperglycemia: Secondary | ICD-10-CM | POA: Insufficient documentation

## 2015-09-18 DIAGNOSIS — I10 Essential (primary) hypertension: Secondary | ICD-10-CM | POA: Insufficient documentation

## 2015-09-18 DIAGNOSIS — R531 Weakness: Secondary | ICD-10-CM | POA: Diagnosis not present

## 2015-09-18 DIAGNOSIS — I82411 Acute embolism and thrombosis of right femoral vein: Secondary | ICD-10-CM | POA: Insufficient documentation

## 2015-09-18 DIAGNOSIS — E669 Obesity, unspecified: Secondary | ICD-10-CM

## 2015-09-18 DIAGNOSIS — C787 Secondary malignant neoplasm of liver and intrahepatic bile duct: Secondary | ICD-10-CM | POA: Insufficient documentation

## 2015-09-18 DIAGNOSIS — Z7982 Long term (current) use of aspirin: Secondary | ICD-10-CM

## 2015-09-18 DIAGNOSIS — E785 Hyperlipidemia, unspecified: Secondary | ICD-10-CM | POA: Insufficient documentation

## 2015-09-18 DIAGNOSIS — G4730 Sleep apnea, unspecified: Secondary | ICD-10-CM | POA: Diagnosis not present

## 2015-09-18 DIAGNOSIS — C689 Malignant neoplasm of urinary organ, unspecified: Secondary | ICD-10-CM | POA: Insufficient documentation

## 2015-09-18 DIAGNOSIS — R102 Pelvic and perineal pain: Secondary | ICD-10-CM | POA: Diagnosis not present

## 2015-09-18 DIAGNOSIS — Z79899 Other long term (current) drug therapy: Secondary | ICD-10-CM

## 2015-09-18 DIAGNOSIS — I252 Old myocardial infarction: Secondary | ICD-10-CM | POA: Insufficient documentation

## 2015-09-18 DIAGNOSIS — C7910 Secondary malignant neoplasm of unspecified urinary organs: Secondary | ICD-10-CM

## 2015-09-18 DIAGNOSIS — Z5111 Encounter for antineoplastic chemotherapy: Secondary | ICD-10-CM | POA: Insufficient documentation

## 2015-09-18 DIAGNOSIS — R5383 Other fatigue: Secondary | ICD-10-CM

## 2015-09-18 DIAGNOSIS — I2510 Atherosclerotic heart disease of native coronary artery without angina pectoris: Secondary | ICD-10-CM

## 2015-09-18 DIAGNOSIS — D649 Anemia, unspecified: Secondary | ICD-10-CM | POA: Diagnosis not present

## 2015-09-18 LAB — CBC WITH DIFFERENTIAL/PLATELET
Basophils Absolute: 0.1 10*3/uL (ref 0–0.1)
Basophils Relative: 1 %
EOS PCT: 3 %
Eosinophils Absolute: 0.3 10*3/uL (ref 0–0.7)
HCT: 28.1 % — ABNORMAL LOW (ref 40.0–52.0)
HEMOGLOBIN: 9.4 g/dL — AB (ref 13.0–18.0)
LYMPHS ABS: 1.4 10*3/uL (ref 1.0–3.6)
LYMPHS PCT: 11 %
MCH: 28.5 pg (ref 26.0–34.0)
MCHC: 33.4 g/dL (ref 32.0–36.0)
MCV: 85.2 fL (ref 80.0–100.0)
MONOS PCT: 14 %
Monocytes Absolute: 1.8 10*3/uL — ABNORMAL HIGH (ref 0.2–1.0)
Neutro Abs: 8.7 10*3/uL — ABNORMAL HIGH (ref 1.4–6.5)
Neutrophils Relative %: 71 %
PLATELETS: 395 10*3/uL (ref 150–440)
RBC: 3.29 MIL/uL — AB (ref 4.40–5.90)
RDW: 18.5 % — ABNORMAL HIGH (ref 11.5–14.5)
WBC: 12.4 10*3/uL — AB (ref 3.8–10.6)

## 2015-09-18 LAB — COMPREHENSIVE METABOLIC PANEL
ALK PHOS: 82 U/L (ref 38–126)
ALT: 17 U/L (ref 17–63)
AST: 20 U/L (ref 15–41)
Albumin: 3.1 g/dL — ABNORMAL LOW (ref 3.5–5.0)
Anion gap: 9 (ref 5–15)
BUN: 25 mg/dL — ABNORMAL HIGH (ref 6–20)
CALCIUM: 8.4 mg/dL — AB (ref 8.9–10.3)
CO2: 20 mmol/L — ABNORMAL LOW (ref 22–32)
CREATININE: 1.17 mg/dL (ref 0.61–1.24)
Chloride: 103 mmol/L (ref 101–111)
Glucose, Bld: 289 mg/dL — ABNORMAL HIGH (ref 65–99)
Potassium: 4.3 mmol/L (ref 3.5–5.1)
Sodium: 132 mmol/L — ABNORMAL LOW (ref 135–145)
Total Bilirubin: 0.4 mg/dL (ref 0.3–1.2)
Total Protein: 6.6 g/dL (ref 6.5–8.1)

## 2015-09-18 MED ORDER — SODIUM CHLORIDE 0.9 % IV SOLN
35.0000 mg/m2 | Freq: Once | INTRAVENOUS | Status: AC
  Administered 2015-09-18: 75 mg via INTRAVENOUS
  Filled 2015-09-18: qty 75

## 2015-09-18 MED ORDER — SODIUM CHLORIDE 0.9 % IV SOLN
Freq: Once | INTRAVENOUS | Status: AC
  Administered 2015-09-18: 11:00:00 via INTRAVENOUS
  Filled 2015-09-18: qty 1000

## 2015-09-18 MED ORDER — HEPARIN SOD (PORK) LOCK FLUSH 100 UNIT/ML IV SOLN
500.0000 [IU] | Freq: Once | INTRAVENOUS | Status: AC | PRN
  Administered 2015-09-18: 500 [IU]

## 2015-09-18 MED ORDER — HEPARIN SOD (PORK) LOCK FLUSH 100 UNIT/ML IV SOLN
500.0000 [IU] | Freq: Once | INTRAVENOUS | Status: AC
  Administered 2015-09-18: 500 [IU] via INTRAVENOUS
  Filled 2015-09-18: qty 5

## 2015-09-18 MED ORDER — PALONOSETRON HCL INJECTION 0.25 MG/5ML
0.2500 mg | Freq: Once | INTRAVENOUS | Status: AC
  Administered 2015-09-18: 0.25 mg via INTRAVENOUS
  Filled 2015-09-18: qty 5

## 2015-09-18 MED ORDER — DEXTROSE-NACL 5-0.45 % IV SOLN
Freq: Once | INTRAVENOUS | Status: AC
  Administered 2015-09-18: 11:00:00 via INTRAVENOUS
  Filled 2015-09-18: qty 1000

## 2015-09-18 MED ORDER — SODIUM CHLORIDE 0.9 % IV SOLN
1000.0000 mg/m2 | Freq: Once | INTRAVENOUS | Status: AC
  Administered 2015-09-18: 2128 mg via INTRAVENOUS
  Filled 2015-09-18: qty 55.97

## 2015-09-18 MED ORDER — SODIUM CHLORIDE 0.9 % IJ SOLN
10.0000 mL | INTRAMUSCULAR | Status: DC | PRN
  Administered 2015-09-18: 10 mL via INTRAVENOUS
  Filled 2015-09-18: qty 10

## 2015-09-18 MED ORDER — SODIUM CHLORIDE 0.9 % IV SOLN
Freq: Once | INTRAVENOUS | Status: AC
  Administered 2015-09-18: 13:00:00 via INTRAVENOUS
  Filled 2015-09-18: qty 5

## 2015-09-18 NOTE — Progress Notes (Signed)
[place]  Telephone:(336) [phone] Fax:(336) [phone]  ID: [name] OB: [birth year]  MR#: [id number]  DZH#:[id number]  Patient Care Team: Birdie Sons, MD as PCP - General (Family Medicine) Murrell Redden, MD (Urology) Dionisio David, MD as Consulting Physician (Cardiology) Lloyd Huger, MD as Consulting Physician (Oncology)  CHIEF COMPLAINT:  Chief Complaint  Patient presents with  . Urothelial Cancer  . Chemotherapy    INTERVAL HISTORY: Patient returns to clinic for further evaluation and reconsideration of cycle 3, day 1 of cisplatin and gemcitabine. His weakness and fatigue have improved.  His medications have been adjusted and he is no longer in A. fib. His pelvic and penile pain or nearly resolved. He denies any shortness of breath or chest pain.  He has no neurologic complaints. He denies any recent fevers. He denies any nausea, vomiting, constipation, or diarrhea. He has no urinary complaints. Patient offers no further specific complaints today.   REVIEW OF SYSTEMS:   Review of Systems  Constitutional: Positive for malaise/fatigue. Negative for fever.  Respiratory: Negative for shortness of breath.   Cardiovascular: Negative.  Negative for chest pain.  Gastrointestinal: Negative.   Genitourinary: Negative.   Musculoskeletal:       Pelvic pain.  Neurological: Positive for weakness.    As per HPI. Otherwise, a complete review of systems is negatve.  PAST MEDICAL HISTORY: Past Medical History  Diagnosis Date  . Abnormal EKG   . Coronary artery disease   . Diabetes mellitus without complication ([place])   . Obesity   . Hyperlipidemia   . Hypertension   . Myocardial infarction ([place]) 2008 and 2012    x 2  . Sleep apnea     could not tolerate cpap mask  . Arthritis     oa  . Cancer [facility])     bladder and prostate  . History of radiation therapy 2012  . History of chemotherapy 2014  . Bladder cancer ([place])   . History of chicken  pox     PAST SURGICAL HISTORY: Past Surgical History  Procedure Laterality Date  . Cardiac catheterization  12-06-2003  . Lad stent  2000 x1 stent, 2008 x 1 stent, 2012 x 1 stent  . Hernia repair  yrs ago  . Appendectomy  many yrs ago  . Rotator cuff repair Left yrs ago  . Seed implant to prostate with radiation  2012  . Protatectomy and urostomy  2014  . Total hip arthroplasty Right 12/27/2013    Procedure: RIGHT TOTAL HIP ARTHROPLASTY ANTERIOR APPROACH;  Surgeon: Mauri Pole, MD;  Location: [facility];  Service: Orthopedics;  Laterality: Right;  . Cardiac catheterization N/A 06/04/2015    Procedure: Left Heart Cath and Coronary Angiography;  Surgeon: Corey Skains, MD;  Location: [place] [facility];  Service: Cardiovascular;  Laterality: N/A;  . Cardiac catheterization N/A 06/04/2015    Procedure: Coronary Stent Intervention;  Surgeon: Wellington Hampshire, MD;  Location: [place] [facility];  Service: Cardiovascular;  Laterality: N/A;    FAMILY HISTORY: Reviewed and unchanged. No report of malignancy or chronic disease.     ADVANCED DIRECTIVES:    HEALTH MAINTENANCE: Social History  Substance Use Topics  . Smoking status: Never Smoker   . Smokeless tobacco: Never Used  . Alcohol Use: 0.0 oz/week    0 Standard drinks or equivalent per week     Comment: occasional     Colonoscopy:  PAP:  Bone density:  Lipid panel:  Allergies  Allergen Reactions  . Celebrex [Celecoxib]     Hematuria Other reaction(s): Bleeding Bleeding in bladder  . Effient [Prasugrel] Other (See Comments)    blood clots, blood in urine Other reaction(s): Bleeding Bleeding in bladder    Current Outpatient Prescriptions  Medication Sig Dispense Refill  . aspirin EC 81 MG tablet Take 81 mg by mouth daily.    [name] atorvastatin (LIPITOR) 40 MG tablet Take 40 mg by mouth every evening.    . Coenzyme Q10 10 MG capsule Take 10 mg by mouth.    . fentaNYL (DURAGESIC - DOSED MCG/HR) 75 MCG/HR Place 1 patch  (75 mcg total) onto the skin every 3 (three) days. 10 patch 0  . Fluticasone Furoate-Vilanterol (BREO ELLIPTA) 100-25 MCG/INH AEPB Inhale 1 Inhaler into the lungs daily.    . furosemide (LASIX) 20 MG tablet Take 20 mg by mouth 2 (two) times daily.    [name] glipiZIDE (GLUCOTROL) 10 MG tablet TAKE 1 TABLET(S) BY MOUTH DAILY 30 tablet 6  . losartan (COZAAR) 100 MG tablet Take 1 tablet by mouth daily.    . Multiple Vitamin (MULTIVITAMIN WITH MINERALS) TABS tablet Take 1 tablet by mouth daily.    . naproxen sodium (ALEVE) 220 MG tablet Take 1 tablet by mouth daily as needed.    . prochlorperazine (COMPAZINE) 10 MG tablet TAKE 1 TABLET BY MOUTH EVERY 6 HOURS AS NEEDED 40 tablet 1  . sotalol (BETAPACE) 80 MG tablet Take by mouth 2 (two) times daily.    . ticagrelor (BRILINTA) 90 MG TABS tablet Take by mouth 2 (two) times daily.    . canagliflozin (INVOKANA) 300 MG TABS tablet Take 300 mg by mouth daily before breakfast. (Patient not taking: Reported on 09/18/2015) 30 tablet 3  . docusate sodium 100 MG CAPS Take 100 mg by mouth 2 (two) times daily. (Patient not taking: Reported on 09/18/2015) 10 capsule 0  . metFORMIN (GLUCOPHAGE-XR) 500 MG 24 hr tablet 2 (TWO) TABLET, ORAL, TWO TIMES DAILY 120 tablet 11   No current facility-administered medications for this visit.   Facility-Administered Medications Ordered in Other Visits  Medication Dose Route Frequency Provider Last Rate Last Dose  . chlorhexidine (HIBICLENS) 4 % liquid 4 application  60 mL Topical Once Danae Orleans, PA-C       And  . chlorhexidine (HIBICLENS) 4 % liquid 4 application  60 mL Topical Once Danae Orleans, PA-C      . sodium chloride 0.9 % injection 10 mL  10 mL Intracatheter PRN Lloyd Huger, MD   10 mL at 05/09/15 0900  . sodium chloride 0.9 % injection 3 mL  3 mL Intravenous PRN Lloyd Huger, MD        OBJECTIVE: Filed Vitals:   09/18/15 0945  BP: 135/80  Pulse: 75  Temp: 96 F (35.6 C)  Resp: 22     Body mass  index is 28.18 kg/(m^2).    ECOG FS:1 - Symptomatic but completely ambulatory  General: Well-developed, well-nourished, no acute distress. Eyes: anicteric sclera. Lungs: Clear to auscultation bilaterally. Heart: Regular rate and rhythm. No rubs, murmurs, or gallops. Abdomen: Soft, nontender, nondistended. No organomegaly noted, normoactive bowel sounds. Musculoskeletal: No edema, cyanosis, or clubbing. Neuro: Alert, answering all questions appropriately. Cranial nerves grossly intact. Skin: No rashes or petechiae noted. Psych: Normal affect.    LAB RESULTS:  Lab Results  Component Value Date   NA 132* 09/18/2015   K 4.3 09/18/2015   CL 103 09/18/2015  CO2 20* 09/18/2015   GLUCOSE 289* 09/18/2015   BUN 25* 09/18/2015   CREATININE 1.17 09/18/2015   CALCIUM 8.4* 09/18/2015   PROT 6.6 09/18/2015   ALBUMIN 3.1* 09/18/2015   AST 20 09/18/2015   ALT 17 09/18/2015   ALKPHOS 82 09/18/2015   BILITOT 0.4 09/18/2015   GFRNONAA >60 09/18/2015   GFRAA >60 09/18/2015    Lab Results  Component Value Date   WBC 12.4* 09/18/2015   NEUTROABS 8.7* 09/18/2015   HGB 9.4* 09/18/2015   HCT 28.1* 09/18/2015   MCV 85.2 09/18/2015   PLT 395 09/18/2015     STUDIES: No results found.  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  PLAN:    1.  Urothelial carcinoma: PET scan results reviewed independently with obvious progression of disease. Proceed with cycle 3, day 1 of cisplatin and gemcitabine today. Patient will receive this regimen on days 1 and 8 with day 15 off. Plan to reimage after 4 cycles. Return to clinic in 1 week for reconsideration of cycle 3, day 8.  If patient progresses within one year, continue consider newly approved atezolizumab.   2. Pain: Nearly resolved. Secondary to malignancy. Patient previously had XRT to his pelvic area. Continue current narcotic regimen as prescribed. 3. Hyperglycemia: Patient is diabetic. Continue glipizide and  metformin as prescribed. Monitor. 4. Anemia: Mild, monitor. 5. Atrial fibrillation: Patient's heart is in regular rhythm today. Continue treatment and follow up with cardiology next week.  Patient expressed understanding and was in agreement with this plan. He also understands that He can call clinic at any time with any questions, concerns, or complaints.   Urothelial cancer   Staging form: Kidney, AJCC 7th Edition     Clinical stage from 03/16/2015: Stage IV (TX, N1, M1) - Signed by Lloyd Huger, MD on 03/16/2015   Lloyd Huger, MD   09/18/2015 10:04 PM

## 2015-09-19 ENCOUNTER — Encounter: Payer: PPO | Attending: Cardiovascular Disease

## 2015-09-19 DIAGNOSIS — Z9861 Coronary angioplasty status: Secondary | ICD-10-CM | POA: Insufficient documentation

## 2015-09-19 DIAGNOSIS — I214 Non-ST elevation (NSTEMI) myocardial infarction: Secondary | ICD-10-CM | POA: Insufficient documentation

## 2015-09-21 ENCOUNTER — Telehealth: Payer: Self-pay | Admitting: *Deleted

## 2015-09-21 ENCOUNTER — Other Ambulatory Visit: Payer: Self-pay | Admitting: *Deleted

## 2015-09-21 DIAGNOSIS — C689 Malignant neoplasm of urinary organ, unspecified: Secondary | ICD-10-CM

## 2015-09-21 NOTE — Telephone Encounter (Signed)
Called to check on status to return to program. Left message to see about timeline for return.

## 2015-09-24 ENCOUNTER — Encounter: Payer: Self-pay | Admitting: *Deleted

## 2015-09-24 ENCOUNTER — Telehealth: Payer: Self-pay | Admitting: *Deleted

## 2015-09-24 DIAGNOSIS — I214 Non-ST elevation (NSTEMI) myocardial infarction: Secondary | ICD-10-CM

## 2015-09-24 DIAGNOSIS — Z9861 Coronary angioplasty status: Secondary | ICD-10-CM

## 2015-09-24 NOTE — Telephone Encounter (Signed)
Called to report that he is having problems right now, he is having side effects form new cardiac med including hiccoughs and his wife fell and broke her hip today. He is going to reschedule his chemo appt to next week

## 2015-09-24 NOTE — Progress Notes (Signed)
Cardiac Individual Treatment Plan  Patient Details  Name: Louis Alexander MRN: [id number] Date of Birth: [birth year] Referring Provider:  Dionisio David, MD  Initial Encounter Date:    Visit Diagnosis: NSTEMI (non-ST elevated myocardial infarction) ([place])  S/P PTCA (percutaneous transluminal coronary angioplasty)  Patient's Home Medications on Admission:  Current outpatient prescriptions:  .  aspirin EC 81 MG tablet, Take 81 mg by mouth daily., Disp: , Rfl:  .  atorvastatin (LIPITOR) 40 MG tablet, Take 40 mg by mouth every evening., Disp: , Rfl:  .  canagliflozin (INVOKANA) 300 MG TABS tablet, Take 300 mg by mouth daily before breakfast. (Patient not taking: Reported on 09/18/2015), Disp: 30 tablet, Rfl: 3 .  Coenzyme Q10 10 MG capsule, Take 10 mg by mouth., Disp: , Rfl:  .  docusate sodium 100 MG CAPS, Take 100 mg by mouth 2 (two) times daily. (Patient not taking: Reported on 09/18/2015), Disp: 10 capsule, Rfl: 0 .  fentaNYL (DURAGESIC - DOSED MCG/HR) 75 MCG/HR, Place 1 patch (75 mcg total) onto the skin every 3 (three) days., Disp: 10 patch, Rfl: 0 .  Fluticasone Furoate-Vilanterol (BREO ELLIPTA) 100-25 MCG/INH AEPB, Inhale 1 Inhaler into the lungs daily., Disp: , Rfl:  .  furosemide (LASIX) 20 MG tablet, Take 20 mg by mouth 2 (two) times daily., Disp: , Rfl:  .  glipiZIDE (GLUCOTROL) 10 MG tablet, TAKE 1 TABLET(S) BY MOUTH DAILY, Disp: 30 tablet, Rfl: 6 .  losartan (COZAAR) 100 MG tablet, Take 1 tablet by mouth daily., Disp: , Rfl:  .  metFORMIN (GLUCOPHAGE-XR) 500 MG 24 hr tablet, 2 (TWO) TABLET, ORAL, TWO TIMES DAILY, Disp: 120 tablet, Rfl: 11 .  Multiple Vitamin (MULTIVITAMIN WITH MINERALS) TABS tablet, Take 1 tablet by mouth daily., Disp: , Rfl:  .  naproxen sodium (ALEVE) 220 MG tablet, Take 1 tablet by mouth daily as needed., Disp: , Rfl:  .  prochlorperazine (COMPAZINE) 10 MG tablet, TAKE 1 TABLET BY MOUTH EVERY 6 HOURS AS NEEDED, Disp: 40 tablet, Rfl: 1 .  sotalol (BETAPACE)  80 MG tablet, Take by mouth 2 (two) times daily., Disp: , Rfl:  .  ticagrelor (BRILINTA) 90 MG TABS tablet, Take by mouth 2 (two) times daily., Disp: , Rfl:  No current facility-administered medications for this visit.  Facility-Administered Medications Ordered in Other Visits:  .  Shower Chin To Toes With 60 mL chlorhexidine (HIBICLENS) the night before surgery, , , Once **AND** Shower Chin To Toes With 60 mL chlorhexidine (HIBICLENS) in AM of surgery after pre-op clip completed, , , Once **AND** chlorhexidine (HIBICLENS) 4 % liquid 4 application, 60 mL, Topical, Once **AND** chlorhexidine (HIBICLENS) 4 % liquid 4 application, 60 mL, Topical, Once, [employer], PA-C .  sodium chloride 0.9 % injection 10 mL, 10 mL, Intracatheter, PRN, Lloyd Huger, MD, 10 mL at 05/09/15 0900 .  sodium chloride 0.9 % injection 3 mL, 3 mL, Intravenous, PRN, Lloyd Huger, MD  Past Medical History: Past Medical History  Diagnosis Date  . Abnormal EKG   . Coronary artery disease   . Diabetes mellitus without complication ([place])   . Obesity   . Hyperlipidemia   . Hypertension   . Myocardial infarction ([facility]) 2008 and 2012    x 2  . Sleep apnea     could not tolerate cpap mask  . Arthritis     oa  . Cancer [facility])     bladder and prostate  . History of radiation therapy 2012  .  History of chemotherapy 2014  . Bladder cancer ([place])   . History of chicken pox     Tobacco Use: History  Smoking status  . Never Smoker   Smokeless tobacco  . Never Used    Labs: Recent Review Flowsheet Data    Labs for ITP Cardiac and Pulmonary Rehab Latest Ref Rng 10/26/2012 12/22/2014 06/12/2015 09/12/2015   Cholestrol 0 - 200 mg/dL 134 189 - -   LDLCALC - 41 115 - -   HDL 35 - 70 mg/dL 36(L) 41 - -   Trlycerides 40 - 160 mg/dL 284(H) 165(A) - -   Hemoglobin A1c - - 7.2(A) 7.7 9.2       Exercise Target Goals:    Exercise Program Goal: Individual exercise prescription set with THRR, safety & activity  barriers. Participant demonstrates ability to understand and report RPE using BORG scale, to self-measure pulse accurately, and to acknowledge the importance of the exercise prescription.  Exercise Prescription Goal: Starting with aerobic activity 30 plus minutes a day, 3 days per week for initial exercise prescription. Provide home exercise prescription and guidelines that participant acknowledges understanding prior to discharge.  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   urostomy tube    Risk Stratification High      6 Minute Walk:     6 Minute Walk      07/31/15 1140       6 Minute Walk   Phase Initial     Distance 850 feet     Walk Time 6 minutes     Resting HR 76 bpm     Resting BP 124/70 mmHg     Max Ex. HR 94 bpm     Max Ex. BP 140/66 mmHg     RPE 12     Symptoms No        Initial Exercise Prescription:     Initial Exercise Prescription - 07/31/15 1100    Date of Initial Exercise Prescription   Date 07/31/15   Treadmill   MPH 1.6   Grade 0   Minutes 10   Bike   Level 0.4   Minutes 10   Recumbant Bike   Level 3   RPM 40   Watts 25   Minutes 10   NuStep   Level 2   Watts 35   Minutes 15   Arm Ergometer   Level 1   Watts 10   Minutes 10   Arm/Foot Ergometer   Level 4   Watts 12   Minutes 10   Cybex   Level 3   RPM 50   Minutes 10   Recumbant Elliptical   Level 1   RPM 40   Watts 10   Minutes 10   Elliptical   Level 1   Speed 3   Minutes 1   REL-XR   Level 2   Watts 30   Minutes 10   Prescription Details   Frequency (times per week) 3   Duration Progress to 30 minutes of continuous aerobic without signs/symptoms of physical distress   Intensity   THRR REST +  30   Ratings of Perceived Exertion 11-15   Progression Continue  progressive overload as per policy without signs/symptoms or physical distress.   Horticulturist, commercial Prescription  Yes   Weight 2   Reps 10-15      Exercise Prescription Changes:     Exercise Prescription Changes      08/21/15 0700 08/22/15 0700 09/18/15 0700       Exercise Review   Progression --  Only two visits recorded No  Only 2 visits recorded and will be out for several weeks No  Absent since last review; last visit 08/06/15     Response to Exercise   Blood Pressure (Admit)  140/80 mmHg      Blood Pressure (Exercise)  168/82 mmHg      Blood Pressure (Exit)  134/72 mmHg      Heart Rate (Admit)  71 bpm      Heart Rate (Exercise)  95 bpm      Heart Rate (Exit)  75 bpm      Rating of Perceived Exertion (Exercise)  12      Symptoms  Tired      Comments  Louis Alexander is going to be out for several weeks due to the cancer that he also has. His workloads may need to be reevaluated upon his return.      Duration  Progress to 30 minutes of continuous aerobic without signs/symptoms of physical distress Progress to 30 minutes of continuous aerobic without signs/symptoms of physical distress     Intensity  Rest + 30 Rest + 30     Progression  Continue progressive overload as per policy without signs/symptoms or physical distress. Continue progressive overload as per policy without signs/symptoms or physical distress.     Resistance Training   Training Prescription  Yes Yes     Weight  2 2     Reps  10-15 10-15     Interval Training   Interval Training  No No     Treadmill   MPH  1.5 1.5     Grade  0 0     Minutes  10 10     REL-XR   Level  2 2     Watts  40 40     Minutes  15 15        Discharge Exercise Prescription (Final Exercise Prescription Changes):     Exercise Prescription Changes - 09/18/15 0700    Exercise Review   Progression No  Absent since last review; last visit 08/06/15   Response to Exercise   Duration Progress to 30 minutes of continuous aerobic  without signs/symptoms of physical distress   Intensity Rest + 30   Progression Continue progressive overload as per policy without signs/symptoms or physical distress.   Resistance Training   Training Prescription Yes   Weight 2   Reps 10-15   Interval Training   Interval Training No   Treadmill   MPH 1.5   Grade 0   Minutes 10   REL-XR   Level 2   Watts 40   Minutes 15      Nutrition:  Target Goals: Understanding of nutrition guidelines, daily intake of sodium 1500mg , cholesterol 200mg , calories 30% from fat and 7% or less from saturated fats, daily to have 5 or more servings of fruits and vegetables.  Biometrics:     Pre Biometrics - 07/31/15 1136    Pre Biometrics   Height 5' 10.5" (1.791 m)   Weight 203 lb 4.8 oz (92.216 kg)   Waist Circumference 40.25 inches   Hip Circumference 44.5 inches   Waist to Hip Ratio 0.9 %   BMI (  Calculated) 28.8       Nutrition Therapy Plan and Nutrition Goals:   Nutrition Discharge: Rate Your Plate Scores:   Nutrition Goals Re-Evaluation:     Nutrition Goals Re-Evaluation      08/23/15 1536           Personal Goal #1 Re-Evaluation   Personal Goal #1 Louis Alexander has been out of Cardiac Rehab since 08/06/2015 since he told Roanna Epley that "exercising is antogonizing his tumor/cancer".           Psychosocial: Target Goals: Acknowledge presence or absence of depression, maximize coping skills, provide positive support system. Participant is able to verbalize types and ability to use techniques and skills needed for reducing stress and depression.  Initial Review & Psychosocial Screening:     Initial Psych Review & Screening - 08/01/15 [place]? Yes   Screening Interventions   Interventions Encouraged to exercise;Program counselor consult      Quality of Life Scores:     Quality of Life - 08/01/15 1208    Quality of Life Scores   Health/Function Pre 19.87 %   Socioeconomic  Pre 24.21 %   Psych/Spiritual Pre 24 %   Family Pre 24.8 %   GLOBAL Pre 22.93 %      PHQ-9:     Recent Review Flowsheet Data    Depression screen [facility] 2/9 07/31/2015 05/03/2015   Decreased Interest 0 0   Down, Depressed, Hopeless 0 0   PHQ - 2 Score 0 0   Altered sleeping 3 -   Tired, decreased energy 1 -   Change in appetite 0 -   Feeling bad or failure about yourself  0 -   Trouble concentrating 0 -   Moving slowly or fidgety/restless 1 -   Suicidal thoughts 0 -   PHQ-9 Score 5 -   Difficult doing work/chores Not difficult at all -      Psychosocial Evaluation and Intervention:   Psychosocial Re-Evaluation:     Psychosocial Re-Evaluation      08/23/15 1537 08/29/15 1157         Psychosocial Re-Evaluation   Interventions Encouraged to attend Cardiac Rehabilitation for the exercise Stress management education      Comments Many stressors including -Louis Alexander has been out of Cardiac Rehab since 08/06/2015 since he told Roanna Epley that "exercising is antogonizing his tumor/cancer".          Vocational Rehabilitation: Provide vocational rehab assistance to qualifying candidates.   Vocational Rehab Evaluation & Intervention:     Vocational Rehab - 07/31/15 1102    Initial Vocational Rehab Evaluation & Intervention   Assessment shows need for Vocational Rehabilitation No      Education: Education Goals: Education classes will be provided on a weekly basis, covering required topics. Participant will state understanding/return demonstration of topics presented.  Learning Barriers/Preferences:     Learning Barriers/Preferences - 07/31/15 1102    Learning Barriers/Preferences   Learning Barriers None   Learning Preferences None      Education Topics: General Nutrition Guidelines/Fats and Fiber: -Group instruction provided by verbal, written material, models and posters to present the general guidelines for heart healthy nutrition. Gives an explanation and  review of dietary fats and fiber.   Controlling Sodium/Reading Food Labels: -Group verbal and written material supporting the discussion of sodium use in heart healthy nutrition. Review and explanation with models, verbal and written materials for utilization of the food label.  Exercise Physiology & Risk Factors: - Group verbal and written instruction with models to review the exercise physiology of the cardiovascular system and associated critical values. Details cardiovascular disease risk factors and the goals associated with each risk factor.   Aerobic Exercise & Resistance Training: - Gives group verbal and written discussion on the health impact of inactivity. On the components of aerobic and resistive training programs and the benefits of this training and how to safely progress through these programs.   Flexibility, Balance, General Exercise Guidelines: - Provides group verbal and written instruction on the benefits of flexibility and balance training programs. Provides general exercise guidelines with specific guidelines to those with heart or lung disease. Demonstration and skill practice provided.          Cardiac Rehab from 08/06/2015 in [facility]   Date  08/06/15   Educator  [facility]   Instruction Review Code  2- meets goals/outcomes      Stress Management: - Provides group verbal and written instruction about the health risks of elevated stress, cause of high stress, and healthy ways to reduce stress.   Depression: - Provides group verbal and written instruction on the correlation between heart/lung disease and depressed mood, treatment options, and the stigmas associated with seeking treatment.   Anatomy & Physiology of the Heart: - Group verbal and written instruction and models provide basic cardiac anatomy and physiology, with the coronary electrical and arterial systems. Review of: AMI, Angina, Valve disease, Heart Failure, Cardiac Arrhythmia, Pacemakers, and  the ICD.   Cardiac Procedures: - Group verbal and written instruction and models to describe the testing methods done to diagnose heart disease. Reviews the outcomes of the test results. Describes the treatment choices: Medical Management, Angioplasty, or Coronary Bypass Surgery.   Cardiac Medications: - Group verbal and written instruction to review commonly prescribed medications for heart disease. Reviews the medication, class of the drug, and side effects. Includes the steps to properly store meds and maintain the prescription regimen.   Go Sex-Intimacy & Heart Disease, Get SMART - Goal Setting: - Group verbal and written instruction through game format to discuss heart disease and the return to sexual intimacy. Provides group verbal and written material to discuss and apply goal setting through the application of the S.M.A.R.T. Method.   Other Matters of the Heart: - Provides group verbal, written materials and models to describe Heart Failure, Angina, Valve Disease, and Diabetes in the realm of heart disease. Includes description of the disease process and treatment options available to the cardiac patient.   Exercise & Equipment Safety: - Individual verbal instruction and demonstration of equipment use and safety with use of the equipment.      Cardiac Rehab from 08/06/2015 in [facility]   Date  08/06/15   Educator  RM   Instruction Review Code  2- meets goals/outcomes      Infection Prevention: - Provides verbal and written material to individual with discussion of infection control including proper hand washing and proper equipment cleaning during exercise session.      Cardiac Rehab from 08/06/2015 in [facility]   Date  08/06/15   Educator  RM   Instruction Review Code  2- meets goals/outcomes      Falls Prevention: - Provides verbal and written material to individual with discussion of falls prevention and safety.   Diabetes: - Individual verbal and  written instruction to review signs/symptoms of diabetes, desired ranges of glucose level fasting, after meals and  with exercise. Advice that pre and post exercise glucose checks will be done for 3 sessions at entry of program.      Cardiac Rehab from 08/06/2015 in [facility]   Date  07/31/15   Educator  SB   Instruction Review Code  2- meets goals/outcomes       Knowledge Questionnaire Score:     Knowledge Questionnaire Score - 08/01/15 1209    Knowledge Questionnaire Score   Pre Score 22/28      Personal Goals and Risk Factors at Admission:     Personal Goals and Risk Factors at Admission - 07/31/15 1103    Personal Goals and Risk Factors on Admission   Increase Aerobic Exercise and Physical Activity Yes   Intervention While in program, learn and follow the exercise prescription taught. Start at a low level workload and increase workload after able to maintain previous level for 30 minutes. Increase time before increasing intensity.   Understand more about Heart/Pulmonary Disease. Yes   Intervention While in program utilize professionals for any questions, and attend the education sessions. Great websites to use are www.americanheart.org or www.lung.org for reliable information.   Diabetes Yes   Goal Blood glucose control identified by blood glucose values, HgbA1C. Participant verbalizes understanding of the signs/symptoms of hyper/hypo glycemia, proper foot care and importance of medication and nutrition plan for blood glucose control.   Intervention Provide nutrition & aerobic exercise along with prescribed medications to achieve blood glucose in normal ranges: Fasting 65-99 mg/dL   Hypertension Yes   Goal Participant will see blood pressure controlled within the values of 140/61mm/Hg or within value directed by their physician.   Intervention Provide nutrition & aerobic exercise along with prescribed medications to achieve BP 140/90 or less.   Lipids Yes   Goal  Cholesterol controlled with medications as prescribed, with individualized exercise RX and with personalized nutrition plan. Value goals: LDL < 70mg , HDL > 40mg . Participant states understanding of desired cholesterol values and following prescriptions.   Intervention Provide nutrition & aerobic exercise along with prescribed medications to achieve LDL 70mg , HDL >40mg .      Personal Goals and Risk Factors Review:      Goals and Risk Factor Review      08/23/15 1537 08/29/15 1157         Increase Aerobic Exercise and Physical Activity   Goals Progress/Improvement seen  No No      Comments Louis Alexander has been out of Cardiac Rehab since 08/06/2015 since he told Roanna Epley that "exercising is antogonizing his tumor/cancer".       Diabetes   Goal --  Louis Alexander has been out of Cardiac Rehab since 08/06/2015 since he told Roanna Epley that "exercising is antogonizing his tumor/cancer".       Hypertension   Goal --  Louis Alexander has been out of Cardiac Rehab since 08/06/2015 since he told Roanna Epley that "exercising is antogonizing his tumor/cancer".          Personal Goals Discharge (Final Personal Goals and Risk Factors Review):      Goals and Risk Factor Review - 08/29/15 1157    Increase Aerobic Exercise and Physical Activity   Goals Progress/Improvement seen  No       Comments: 30 day review. Continue with ITP. Has been out for medical reasons, plans to return.

## 2015-09-24 NOTE — Telephone Encounter (Signed)
Ok, thank you

## 2015-09-25 ENCOUNTER — Inpatient Hospital Stay: Payer: PPO

## 2015-09-25 ENCOUNTER — Inpatient Hospital Stay
Admission: EM | Admit: 2015-09-25 | Discharge: 2015-09-30 | DRG: 673 | Disposition: A | Discharge status: Home / Self Care | Payer: PPO | Attending: Internal Medicine | Admitting: Internal Medicine

## 2015-09-25 ENCOUNTER — Encounter: Payer: Self-pay | Admitting: Emergency Medicine

## 2015-09-25 ENCOUNTER — Encounter
Admission: EM | Disposition: A | Discharge status: Home / Self Care | Payer: Self-pay | Source: Home / Self Care | Attending: Internal Medicine

## 2015-09-25 ENCOUNTER — Inpatient Hospital Stay: Payer: PPO | Admitting: Oncology

## 2015-09-25 ENCOUNTER — Emergency Department: Payer: PPO

## 2015-09-25 DIAGNOSIS — Z7902 Long term (current) use of antithrombotics/antiplatelets: Secondary | ICD-10-CM | POA: Diagnosis not present

## 2015-09-25 DIAGNOSIS — Z9889 Other specified postprocedural states: Secondary | ICD-10-CM

## 2015-09-25 DIAGNOSIS — I480 Paroxysmal atrial fibrillation: Secondary | ICD-10-CM | POA: Diagnosis present

## 2015-09-25 DIAGNOSIS — D6489 Other specified anemias: Secondary | ICD-10-CM | POA: Diagnosis not present

## 2015-09-25 DIAGNOSIS — R319 Hematuria, unspecified: Secondary | ICD-10-CM

## 2015-09-25 DIAGNOSIS — N170 Acute kidney failure with tubular necrosis: Secondary | ICD-10-CM | POA: Diagnosis present

## 2015-09-25 DIAGNOSIS — I2699 Other pulmonary embolism without acute cor pulmonale: Secondary | ICD-10-CM | POA: Diagnosis present

## 2015-09-25 DIAGNOSIS — R066 Hiccough: Secondary | ICD-10-CM | POA: Diagnosis present

## 2015-09-25 DIAGNOSIS — E871 Hypo-osmolality and hyponatremia: Secondary | ICD-10-CM | POA: Diagnosis present

## 2015-09-25 DIAGNOSIS — Z9049 Acquired absence of other specified parts of digestive tract: Secondary | ICD-10-CM

## 2015-09-25 DIAGNOSIS — E669 Obesity, unspecified: Secondary | ICD-10-CM | POA: Diagnosis present

## 2015-09-25 DIAGNOSIS — R310 Gross hematuria: Secondary | ICD-10-CM | POA: Diagnosis present

## 2015-09-25 DIAGNOSIS — R195 Other fecal abnormalities: Secondary | ICD-10-CM | POA: Diagnosis present

## 2015-09-25 DIAGNOSIS — E860 Dehydration: Secondary | ICD-10-CM | POA: Diagnosis present

## 2015-09-25 DIAGNOSIS — Z955 Presence of coronary angioplasty implant and graft: Secondary | ICD-10-CM | POA: Diagnosis not present

## 2015-09-25 DIAGNOSIS — Z8249 Family history of ischemic heart disease and other diseases of the circulatory system: Secondary | ICD-10-CM

## 2015-09-25 DIAGNOSIS — Z888 Allergy status to other drugs, medicaments and biological substances status: Secondary | ICD-10-CM | POA: Diagnosis not present

## 2015-09-25 DIAGNOSIS — Z7982 Long term (current) use of aspirin: Secondary | ICD-10-CM

## 2015-09-25 DIAGNOSIS — E1165 Type 2 diabetes mellitus with hyperglycemia: Secondary | ICD-10-CM | POA: Diagnosis present

## 2015-09-25 DIAGNOSIS — IMO0001 Reserved for inherently not codable concepts without codable children: Secondary | ICD-10-CM

## 2015-09-25 DIAGNOSIS — R531 Weakness: Secondary | ICD-10-CM

## 2015-09-25 DIAGNOSIS — Z823 Family history of stroke: Secondary | ICD-10-CM | POA: Diagnosis not present

## 2015-09-25 DIAGNOSIS — Z9221 Personal history of antineoplastic chemotherapy: Secondary | ICD-10-CM | POA: Diagnosis not present

## 2015-09-25 DIAGNOSIS — I252 Old myocardial infarction: Secondary | ICD-10-CM | POA: Diagnosis not present

## 2015-09-25 DIAGNOSIS — I82409 Acute embolism and thrombosis of unspecified deep veins of unspecified lower extremity: Secondary | ICD-10-CM

## 2015-09-25 DIAGNOSIS — G4730 Sleep apnea, unspecified: Secondary | ICD-10-CM | POA: Diagnosis present

## 2015-09-25 DIAGNOSIS — E1122 Type 2 diabetes mellitus with diabetic chronic kidney disease: Secondary | ICD-10-CM | POA: Diagnosis present

## 2015-09-25 DIAGNOSIS — Z923 Personal history of irradiation: Secondary | ICD-10-CM

## 2015-09-25 DIAGNOSIS — Z800 Family history of malignant neoplasm of digestive organs: Secondary | ICD-10-CM | POA: Diagnosis not present

## 2015-09-25 DIAGNOSIS — I2510 Atherosclerotic heart disease of native coronary artery without angina pectoris: Secondary | ICD-10-CM | POA: Diagnosis present

## 2015-09-25 DIAGNOSIS — D500 Iron deficiency anemia secondary to blood loss (chronic): Secondary | ICD-10-CM | POA: Diagnosis present

## 2015-09-25 DIAGNOSIS — C689 Malignant neoplasm of urinary organ, unspecified: Principal | ICD-10-CM | POA: Diagnosis present

## 2015-09-25 DIAGNOSIS — I129 Hypertensive chronic kidney disease with stage 1 through stage 4 chronic kidney disease, or unspecified chronic kidney disease: Secondary | ICD-10-CM | POA: Diagnosis present

## 2015-09-25 DIAGNOSIS — E785 Hyperlipidemia, unspecified: Secondary | ICD-10-CM | POA: Diagnosis present

## 2015-09-25 DIAGNOSIS — M1990 Unspecified osteoarthritis, unspecified site: Secondary | ICD-10-CM | POA: Diagnosis present

## 2015-09-25 DIAGNOSIS — N189 Chronic kidney disease, unspecified: Secondary | ICD-10-CM | POA: Diagnosis present

## 2015-09-25 DIAGNOSIS — Z936 Other artificial openings of urinary tract status: Secondary | ICD-10-CM

## 2015-09-25 DIAGNOSIS — D649 Anemia, unspecified: Secondary | ICD-10-CM

## 2015-09-25 DIAGNOSIS — Z96641 Presence of right artificial hip joint: Secondary | ICD-10-CM | POA: Diagnosis present

## 2015-09-25 DIAGNOSIS — C61 Malignant neoplasm of prostate: Secondary | ICD-10-CM | POA: Diagnosis present

## 2015-09-25 DIAGNOSIS — Z8551 Personal history of malignant neoplasm of bladder: Secondary | ICD-10-CM | POA: Diagnosis not present

## 2015-09-25 DIAGNOSIS — Z801 Family history of malignant neoplasm of trachea, bronchus and lung: Secondary | ICD-10-CM | POA: Diagnosis not present

## 2015-09-25 DIAGNOSIS — R0600 Dyspnea, unspecified: Secondary | ICD-10-CM | POA: Diagnosis present

## 2015-09-25 DIAGNOSIS — R631 Polydipsia: Secondary | ICD-10-CM | POA: Diagnosis not present

## 2015-09-25 DIAGNOSIS — N179 Acute kidney failure, unspecified: Secondary | ICD-10-CM

## 2015-09-25 DIAGNOSIS — D62 Acute posthemorrhagic anemia: Secondary | ICD-10-CM | POA: Diagnosis present

## 2015-09-25 DIAGNOSIS — Z79899 Other long term (current) drug therapy: Secondary | ICD-10-CM | POA: Diagnosis not present

## 2015-09-25 DIAGNOSIS — O2230 Deep phlebothrombosis in pregnancy, unspecified trimester: Secondary | ICD-10-CM

## 2015-09-25 DIAGNOSIS — Z95828 Presence of other vascular implants and grafts: Secondary | ICD-10-CM

## 2015-09-25 DIAGNOSIS — D696 Thrombocytopenia, unspecified: Secondary | ICD-10-CM

## 2015-09-25 DIAGNOSIS — D6959 Other secondary thrombocytopenia: Secondary | ICD-10-CM | POA: Diagnosis present

## 2015-09-25 HISTORY — PX: PERIPHERAL VASCULAR CATHETERIZATION: SHX172C

## 2015-09-25 LAB — URINALYSIS COMPLETE WITH MICROSCOPIC (ARMC ONLY)
BILIRUBIN URINE: NEGATIVE
Glucose, UA: >500 mg/dL — AB
LEUKOCYTES UA: NEGATIVE
Nitrite: NEGATIVE
PH: 5 (ref 5.0–8.0)
PROTEIN: 100 mg/dL — AB
SQUAMOUS EPITHELIAL / LPF: NONE SEEN
Specific Gravity, Urine: 1.015 (ref 1.005–1.030)

## 2015-09-25 LAB — CBC WITH DIFFERENTIAL/PLATELET
BAND NEUTROPHILS: 2 %
BASOS ABS: 0 10*3/uL (ref 0–0.1)
BASOS PCT: 0 %
BLASTS: 0 %
EOS ABS: 0.1 10*3/uL (ref 0–0.7)
EOS PCT: 1 %
HEMATOCRIT: 26.3 % — AB (ref 40.0–52.0)
Hemoglobin: 8.7 g/dL — ABNORMAL LOW (ref 13.0–18.0)
LYMPHS ABS: 0.7 10*3/uL — AB (ref 1.0–3.6)
LYMPHS PCT: 6 %
MCH: 28.6 pg (ref 26.0–34.0)
MCHC: 33 g/dL (ref 32.0–36.0)
MCV: 86.5 fL (ref 80.0–100.0)
METAMYELOCYTES PCT: 1 %
MONO ABS: 0.8 10*3/uL (ref 0.2–1.0)
MONOS PCT: 7 %
Myelocytes: 0 %
NEUTROS ABS: 10.1 10*3/uL — AB (ref 1.4–6.5)
Neutrophils Relative %: 83 %
OTHER: 0 %
PLATELETS: 157 10*3/uL (ref 150–440)
Promyelocytes Absolute: 0 %
RBC: 3.03 MIL/uL — ABNORMAL LOW (ref 4.40–5.90)
RDW: 18.1 % — AB (ref 11.5–14.5)
Smear Review: ADEQUATE
WBC: 11.7 10*3/uL — ABNORMAL HIGH (ref 3.8–10.6)
nRBC: 0 /100 WBC

## 2015-09-25 LAB — PROTIME-INR
INR: 1.91
Prothrombin Time: 22 seconds — ABNORMAL HIGH (ref 11.4–15.0)

## 2015-09-25 LAB — ABO/RH: ABO/RH(D): O NEG

## 2015-09-25 LAB — HEMOGLOBIN: HEMOGLOBIN: 8 g/dL — AB (ref 13.0–18.0)

## 2015-09-25 LAB — GLUCOSE, CAPILLARY: GLUCOSE-CAPILLARY: 255 mg/dL — AB (ref 65–99)

## 2015-09-25 LAB — APTT: APTT: 31 s (ref 24–36)

## 2015-09-25 SURGERY — IVC FILTER INSERTION
Anesthesia: Moderate Sedation

## 2015-09-25 MED ORDER — ATENOLOL 25 MG PO TABS
25.0000 mg | ORAL_TABLET | Freq: Every day | ORAL | Status: DC
  Administered 2015-09-26 – 2015-09-30 (×4): 25 mg via ORAL
  Filled 2015-09-25 (×5): qty 1

## 2015-09-25 MED ORDER — HYDROCODONE-ACETAMINOPHEN 5-325 MG PO TABS
1.0000 | ORAL_TABLET | ORAL | Status: DC | PRN
  Administered 2015-09-28: 1 via ORAL
  Filled 2015-09-25: qty 1

## 2015-09-25 MED ORDER — FUROSEMIDE 20 MG PO TABS
20.0000 mg | ORAL_TABLET | Freq: Two times a day (BID) | ORAL | Status: DC
  Administered 2015-09-25 – 2015-09-30 (×9): 20 mg via ORAL
  Filled 2015-09-25 (×9): qty 1

## 2015-09-25 MED ORDER — SENNOSIDES-DOCUSATE SODIUM 8.6-50 MG PO TABS
1.0000 | ORAL_TABLET | Freq: Every evening | ORAL | Status: DC | PRN
  Administered 2015-09-29: 1 via ORAL
  Filled 2015-09-25: qty 1

## 2015-09-25 MED ORDER — LIDOCAINE-EPINEPHRINE (PF) 1 %-1:200000 IJ SOLN
INTRAMUSCULAR | Status: AC
  Filled 2015-09-25: qty 30

## 2015-09-25 MED ORDER — INSULIN ASPART 100 UNIT/ML ~~LOC~~ SOLN
0.0000 [IU] | Freq: Three times a day (TID) | SUBCUTANEOUS | Status: DC
  Administered 2015-09-26 (×2): 5 [IU] via SUBCUTANEOUS
  Administered 2015-09-26: 2 [IU] via SUBCUTANEOUS
  Administered 2015-09-27: 3 [IU] via SUBCUTANEOUS
  Administered 2015-09-27: 2 [IU] via SUBCUTANEOUS
  Administered 2015-09-28: 3 [IU] via SUBCUTANEOUS
  Administered 2015-09-29: 2 [IU] via SUBCUTANEOUS
  Administered 2015-09-29: 3 [IU] via SUBCUTANEOUS
  Administered 2015-09-29 – 2015-09-30 (×2): 2 [IU] via SUBCUTANEOUS
  Administered 2015-09-30: 3 [IU] via SUBCUTANEOUS
  Filled 2015-09-25: qty 3
  Filled 2015-09-25: qty 2
  Filled 2015-09-25: qty 3
  Filled 2015-09-25 (×2): qty 2
  Filled 2015-09-25: qty 5
  Filled 2015-09-25: qty 3
  Filled 2015-09-25: qty 2
  Filled 2015-09-25 (×2): qty 3
  Filled 2015-09-25: qty 5
  Filled 2015-09-25: qty 2

## 2015-09-25 MED ORDER — MIDAZOLAM HCL 2 MG/2ML IJ SOLN
INTRAMUSCULAR | Status: AC
  Filled 2015-09-25: qty 2

## 2015-09-25 MED ORDER — TICAGRELOR 90 MG PO TABS
90.0000 mg | ORAL_TABLET | Freq: Two times a day (BID) | ORAL | Status: DC
  Administered 2015-09-25 – 2015-09-26 (×2): 90 mg via ORAL
  Filled 2015-09-25 (×2): qty 1

## 2015-09-25 MED ORDER — POTASSIUM CHLORIDE ER 10 MEQ PO TBCR
10.0000 meq | EXTENDED_RELEASE_TABLET | Freq: Every day | ORAL | Status: DC
  Administered 2015-09-26 – 2015-09-30 (×5): 10 meq via ORAL
  Filled 2015-09-25 (×11): qty 1

## 2015-09-25 MED ORDER — ADULT MULTIVITAMIN W/MINERALS CH
1.0000 | ORAL_TABLET | Freq: Every day | ORAL | Status: DC
  Administered 2015-09-26 – 2015-09-30 (×5): 1 via ORAL
  Filled 2015-09-25 (×5): qty 1

## 2015-09-25 MED ORDER — PANTOPRAZOLE SODIUM 40 MG PO TBEC
40.0000 mg | DELAYED_RELEASE_TABLET | Freq: Two times a day (BID) | ORAL | Status: DC
  Administered 2015-09-25 – 2015-09-30 (×10): 40 mg via ORAL
  Filled 2015-09-25 (×10): qty 1

## 2015-09-25 MED ORDER — HEPARIN (PORCINE) IN NACL 2-0.9 UNIT/ML-% IJ SOLN
INTRAMUSCULAR | Status: AC
Start: 2015-09-25 — End: 2015-09-25
  Filled 2015-09-25: qty 500

## 2015-09-25 MED ORDER — FLUTICASONE FUROATE-VILANTEROL 100-25 MCG/INH IN AEPB: 1.0000 | INHALATION_SPRAY | Freq: Every day | RESPIRATORY_TRACT | Status: DC

## 2015-09-25 MED ORDER — ACETAMINOPHEN 325 MG PO TABS: 650.0000 mg | ORAL_TABLET | Freq: Four times a day (QID) | ORAL | Status: DC | PRN

## 2015-09-25 MED ORDER — SODIUM CHLORIDE 0.9 % IJ SOLN
3.0000 mL | Freq: Two times a day (BID) | INTRAMUSCULAR | Status: DC
  Administered 2015-09-26: 3 mL via INTRAVENOUS

## 2015-09-25 MED ORDER — IOHEXOL 300 MG/ML  SOLN
INTRAMUSCULAR | Status: DC | PRN
  Administered 2015-09-25: 15 mL via INTRAVENOUS

## 2015-09-25 MED ORDER — SOTALOL HCL 80 MG PO TABS
80.0000 mg | ORAL_TABLET | Freq: Two times a day (BID) | ORAL | Status: DC
  Administered 2015-09-25 – 2015-09-27 (×4): 80 mg via ORAL
  Filled 2015-09-25 (×4): qty 1

## 2015-09-25 MED ORDER — FUROSEMIDE 20 MG PO TABS: 20.0000 mg | ORAL_TABLET | Freq: Two times a day (BID) | ORAL | Status: DC

## 2015-09-25 MED ORDER — FENTANYL 25 MCG/HR TD PT72
75.0000 ug | MEDICATED_PATCH | TRANSDERMAL | Status: DC
  Administered 2015-09-26 – 2015-09-29 (×2): 75 ug via TRANSDERMAL
  Filled 2015-09-25 (×2): qty 3

## 2015-09-25 MED ORDER — CANAGLIFLOZIN 300 MG PO TABS
300.0000 mg | ORAL_TABLET | Freq: Every day | ORAL | Status: DC
  Administered 2015-09-26 – 2015-09-30 (×5): 300 mg via ORAL
  Filled 2015-09-25 (×6): qty 300

## 2015-09-25 MED ORDER — SUCRALFATE 1 G PO TABS
1.0000 g | ORAL_TABLET | Freq: Three times a day (TID) | ORAL | Status: DC
  Administered 2015-09-25 – 2015-09-30 (×19): 1 g via ORAL
  Filled 2015-09-25 (×19): qty 1

## 2015-09-25 MED ORDER — MIDAZOLAM HCL 2 MG/2ML IJ SOLN
INTRAMUSCULAR | Status: DC | PRN
  Administered 2015-09-25: 1 mg via INTRAVENOUS

## 2015-09-25 MED ORDER — ALUM & MAG HYDROXIDE-SIMETH 200-200-20 MG/5ML PO SUSP: 30.0000 mL | Freq: Four times a day (QID) | ORAL | Status: DC | PRN

## 2015-09-25 MED ORDER — INSULIN ASPART 100 UNIT/ML ~~LOC~~ SOLN
0.0000 [IU] | Freq: Every day | SUBCUTANEOUS | Status: DC
  Administered 2015-09-25: 3 [IU] via SUBCUTANEOUS
  Administered 2015-09-29: 2 [IU] via SUBCUTANEOUS
  Filled 2015-09-25: qty 2
  Filled 2015-09-25: qty 3

## 2015-09-25 MED ORDER — MOMETASONE FURO-FORMOTEROL FUM 100-5 MCG/ACT IN AERO
2.0000 | INHALATION_SPRAY | Freq: Two times a day (BID) | RESPIRATORY_TRACT | Status: DC
Start: 2015-09-25 — End: 2015-09-30
  Administered 2015-09-25 – 2015-09-30 (×10): 2 via RESPIRATORY_TRACT
  Filled 2015-09-25: qty 8.8

## 2015-09-25 MED ORDER — ASPIRIN EC 81 MG PO TBEC
81.0000 mg | DELAYED_RELEASE_TABLET | Freq: Every day | ORAL | Status: DC
  Administered 2015-09-26 – 2015-09-30 (×5): 81 mg via ORAL
  Filled 2015-09-25 (×5): qty 1

## 2015-09-25 MED ORDER — GLIPIZIDE 10 MG PO TABS
10.0000 mg | ORAL_TABLET | Freq: Every day | ORAL | Status: DC
  Administered 2015-09-26 – 2015-09-30 (×5): 10 mg via ORAL
  Filled 2015-09-25 (×5): qty 1

## 2015-09-25 MED ORDER — ONDANSETRON HCL 4 MG PO TABS: 4.0000 mg | ORAL_TABLET | Freq: Four times a day (QID) | ORAL | Status: DC | PRN

## 2015-09-25 MED ORDER — ACETAMINOPHEN 650 MG RE SUPP: 650.0000 mg | Freq: Four times a day (QID) | RECTAL | Status: DC | PRN

## 2015-09-25 MED ORDER — ONDANSETRON HCL 4 MG/2ML IJ SOLN: 4.0000 mg | Freq: Four times a day (QID) | INTRAMUSCULAR | Status: DC | PRN

## 2015-09-25 SURGICAL SUPPLY — 3 items
FILTER CELECT-JUGULAR (Filter) ×2 IMPLANT
PACK ANGIOGRAPHY (CUSTOM PROCEDURE TRAY) ×2 IMPLANT
WIRE J 3MM .035X145CM (WIRE) ×2 IMPLANT

## 2015-09-25 NOTE — H&P (Addendum)
[facility] NAME: Louis Alexander    MR#:  [id number]  DATE OF BIRTH:  November 03, 1941  DATE OF ADMISSION:  09/25/2015  PRIMARY CARE PHYSICIAN: Lelon Huh, MD   REQUESTING/REFERRING PHYSICIAN: Dr Burlene Arnt  CHIEF COMPLAINT:  Weakness and hematuria HISTORY OF PRESENT ILLNESS:  Louis Alexander  is a 74 y.o. male with a known history of metastatic prostate cancer, type 2 diabetes, CAD and recent diagnosis of pulmonary emboli who presents with above complaint. Patient was seen by his cardiologist early in November and was complete and shortness of breath. A CT scan showed pulmonary emboli. He was started on ELIQUIS 10 mg by mouth bid. Shortly after starting this he developed hematuria. I will request was decreased to 5 mg by mouth twice a day however patient continues to have hematuria and weakness. He presents with these complaints. His cardiologist was contacted and recommended starting heparin drip rather than oral anticoagulation.  PAST MEDICAL HISTORY:   Past Medical History  Diagnosis Date  . Abnormal EKG   . Coronary artery disease   . Diabetes mellitus without complication ([place])   . Obesity   . Hyperlipidemia   . Hypertension   . Myocardial infarction ([place]) 2008 and 2012    x 2  . Sleep apnea     could not tolerate cpap mask  . Arthritis     oa  . Cancer [facility])     bladder and prostate  . History of radiation therapy 2012  . History of chemotherapy 2014  . Bladder cancer ([place])   . History of chicken pox     PAST SURGICAL HISTORY:   Past Surgical History  Procedure Laterality Date  . Cardiac catheterization  12-06-2003  . Lad stent  2000 x1 stent, 2008 x 1 stent, 2012 x 1 stent  . Hernia repair  yrs ago  . Appendectomy  many yrs ago  . Rotator cuff repair Left yrs ago  . Seed implant to prostate with radiation  2012  . Protatectomy and urostomy  2014  . Total hip arthroplasty Right 12/27/2013    Procedure:  RIGHT TOTAL HIP ARTHROPLASTY ANTERIOR APPROACH;  Surgeon: Mauri Pole, MD;  Location: [facility];  Service: Orthopedics;  Laterality: Right;  . Cardiac catheterization N/A 06/04/2015    Procedure: Left Heart Cath and Coronary Angiography;  Surgeon: Corey Skains, MD;  Location: [facility];  Service: Cardiovascular;  Laterality: N/A;  . Cardiac catheterization N/A 06/04/2015    Procedure: Coronary Stent Intervention;  Surgeon: Wellington Hampshire, MD;  Location: [facility];  Service: Cardiovascular;  Laterality: N/A;    SOCIAL HISTORY:   Social History  Substance Use Topics  . Smoking status: Never Smoker   . Smokeless tobacco: Never Used  . Alcohol Use: 0.0 oz/week    0 Standard drinks or equivalent per week     Comment: occasional    FAMILY HISTORY:   Family History  Problem Relation Age of Onset  . Stroke Father 29  . Aneurysm Father   . Heart attack Mother   . Lung cancer Other   . Stomach cancer Other     DRUG ALLERGIES:   Allergies  Allergen Reactions  . Celebrex [Celecoxib]     Hematuria Other reaction(s): Bleeding Bleeding in bladder  . Effient [Prasugrel] Other (See Comments)    blood clots, blood in urine Other reaction(s): Bleeding Bleeding in bladder     REVIEW OF  SYSTEMS:  CONSTITUTIONAL: No fever, +++ fatigue andweakness.  EYES: No blurred or double vision.  EARS, NOSE, AND THROAT: No tinnitus or ear pain.  RESPIRATORY: No cough, shortness of breath, wheezing or hemoptysis.  CARDIOVASCULAR: No chest pain, orthopnea, edema.  GASTROINTESTINAL: No nausea, vomiting, diarrhea or abdominal pain.  GENITOURINARY: No dysuria, +++++hematuria.  ENDOCRINE: No polyuria, nocturia,  HEMATOLOGY: ++ anemia, easy bruising ++ bleeding SKIN: No rash or lesion. MUSCULOSKELETAL: No joint pain or arthritis.   NEUROLOGIC: No tingling, numbness, weakness.  PSYCHIATRY: No anxiety or depression.   MEDICATIONS AT HOME:   Prior to Admission medications    Medication Sig Start Date End Date Taking? Authorizing Provider  apixaban (ELIQUIS) 5 MG TABS tablet Take 5 mg by mouth 2 (two) times daily.   Yes Historical Provider, MD  aspirin EC 81 MG tablet Take 81 mg by mouth daily.   Yes Historical Provider, MD  atenolol (TENORMIN) 25 MG tablet Take 25 mg by mouth daily.   Yes Historical Provider, MD  canagliflozin (INVOKANA) 300 MG TABS tablet Take 300 mg by mouth daily before breakfast. 09/12/15  Yes Birdie Sons, MD  Coenzyme Q10 10 MG capsule Take 10 mg by mouth.   Yes Historical Provider, MD  fentaNYL (DURAGESIC - DOSED MCG/HR) 75 MCG/HR Place 1 patch (75 mcg total) onto the skin every 3 (three) days. 09/11/15  Yes Lloyd Huger, MD  Fluticasone Furoate-Vilanterol (BREO ELLIPTA) 100-25 MCG/INH AEPB Inhale 1 Inhaler into the lungs daily.   Yes Historical Provider, MD  furosemide (LASIX) 20 MG tablet Take 20 mg by mouth 2 (two) times daily.   Yes Historical Provider, MD  glipiZIDE (GLUCOTROL) 10 MG tablet TAKE 1 TABLET(S) BY MOUTH DAILY 07/16/15  Yes Birdie Sons, MD  metFORMIN (GLUCOPHAGE-XR) 500 MG 24 hr tablet 2 (TWO) TABLET, ORAL, TWO TIMES DAILY 09/18/15  Yes Birdie Sons, MD  Multiple Vitamin (MULTIVITAMIN WITH MINERALS) TABS tablet Take 1 tablet by mouth daily.   Yes Historical Provider, MD  naproxen sodium (ALEVE) 220 MG tablet Take 1 tablet by mouth daily as needed.   Yes Historical Provider, MD  pantoprazole (PROTONIX) 40 MG tablet Take 40 mg by mouth 2 (two) times daily.   Yes Historical Provider, MD  potassium chloride (K-DUR) 10 MEQ tablet Take 10 mEq by mouth daily.   Yes Historical Provider, MD  prochlorperazine (COMPAZINE) 10 MG tablet TAKE 1 TABLET BY MOUTH EVERY 6 HOURS AS NEEDED 09/03/15  Yes Evlyn Kanner, NP  sotalol (BETAPACE) 80 MG tablet Take by mouth 2 (two) times daily.   Yes Historical Provider, MD  sucralfate (CARAFATE) 1 G tablet Take 1 g by mouth 4 (four) times daily -  with meals and at bedtime.   Yes  Historical Provider, MD  ticagrelor (BRILINTA) 90 MG TABS tablet Take by mouth 2 (two) times daily.   Yes Historical Provider, MD      VITAL SIGNS:  Blood pressure 127/67, pulse 80, resp. rate 20, height 5\' 10"  (1.778 m), weight 88.451 kg (195 lb), SpO2 100 %.  PHYSICAL EXAMINATION:  GENERAL:  74 y.o.-year-old patient lying in the bed with no acute distress.  EYES: Pupils equal, round, reactive to light and accommodation. No scleral icterus. Extraocular muscles intact.  HEENT: Head atraumatic, normocephalic. Oropharynx clear.  NECK:  Supple, no jugular venous distention. No thyroid enlargement, no tenderness.  LUNGS: Normal breath sounds bilaterally, no wheezing, rales,rhonchi or crepitation. No use of accessory muscles of respiration.  CARDIOVASCULAR: S1, S2  normal. No murmurs, rubs, or gallops.  ABDOMEN: Soft, nontender, nondistended. Bowel sounds present. No organomegaly or mass. Urostomy bag  EXTREMITIES: No pedal edema, cyanosis, or clubbing.  NEUROLOGIC: Cranial nerves II through XII are grossly intact. No focal deficits. PSYCHIATRIC: The patient is alert and oriented x 3.  SKIN: No obvious rash, lesion, or ulcer.   LABORATORY PANEL:   CBC  Recent Labs Lab 09/25/15 1025  WBC 11.7*  HGB 8.7*  HCT 26.3*  PLT 157   ------------------------------------------------------------------------------------------------------------------  Chemistries  No results for input(s): NA, K, CL, CO2, GLUCOSE, BUN, CREATININE, CALCIUM, MG, AST, ALT, ALKPHOS, BILITOT in the last 168 hours.  Invalid input(s): GFRCGP ------------------------------------------------------------------------------------------------------------------  Cardiac Enzymes No results for input(s): TROPONINI in the last 168 hours. ------------------------------------------------------------------------------------------------------------------  RADIOLOGY:  Dg Chest 2 View  09/25/2015  CLINICAL DATA:  History of  metastatic prostate cancer. The patient was diagnosed with pulmonary embolus 09/21/2015. Increased dyspnea since decreasing anticoagulation therapy. EXAM: CHEST  2 VIEW COMPARISON:  PET CT scan 07/26/2015. FINDINGS: Port-A-Cath is in place. The lungs are clear. Heart size is normal. No pneumothorax or pleural effusion. IMPRESSION: No acute disease. Electronically Signed   By: Inge Rise M.D.   On: 09/25/2015 11:21    Fourth 2016 shows pulmonary emboli in the proximal right main pulmonary artery and segmental and some segmental branches of the left. Left lung mass versus infiltrate versus scarring.  EKG:   Sinus rhythm no ST elevation/depression  IMPRESSION AND PLAN:  This is 74 year old male with a history of metastatic prostate cancer on chemotherapy, coronary artery disease and recent diagnosis of pulmonary emboli who presented with fatigue and weakness as well as hematuria on Eliquis.  1. Pulmonary emboli in the right main pulmonary artery and segmental branches on the left: This is a very difficult case due to hematuria.  He had an ECHO as per ER MD who spoke with Dr Humphrey Rolls that did not show right heart strain. I will also need to consult urology, oncology, vascular surgery, pulmonary and cardiology for their input. Patient may benefit from IVC filter. He is currently on aspirin and Brilinta and therefore will need cardiology's input on these medications in the setting of hematuria.  I will also order lower x-ray Dopplers to evaluate for DVT. INR is 1.9. I will speak with vascular before starting anticoagulation as it may be better for him to have IVCF placed rather then heparin.  2. Hematuria: Patient was recently started on eliquis for pulmonary emboli and then subsequently developed hematuria. He does have a urostomy bag with a history of metastatic prostate cancer. He is also on Brilinta and aspirin in addition to Eliquis. As mentioned I will consult urology and cardiology for their  opinion.  3. Guaiac positive stool: Given patient's pulmonary emboli it is unlikely that GI will want to perform invasive workup. Patient denies hematochezia or melena. Continue to monitor hemoglobin. Continue pantoprazole 40 mg by mouth twice a day  4. History of metastatic prostate cancer: I will obtain oncology and urology consultation. Patient is currently receiving chemotherapy.  5. Acute on chronic anemia with blood loss, acute: Hgb is 8.7 this time patient does not require blood transfusion. I will repeat a hemoglobin later this afternoon. Patient has consented for blood transfusion if needed.  6. Type 2 diabetes without complication: Patient is on sliding scale insulin, glipizide and ADA diet.  7. CAD with previous stents: I will obtain cardiology consultation. Patient is on aspirin, Brilinta, simvastatin and sotalol in the  setting of hematuria.  All the records are reviewed and case discussed with ED provider. Management plans discussed with the patient and daughter and he is in agreement.  CODE STATUS: FULL  CRITICAL CARE TOTAL TIME TAKING CARE OF THIS PATIENT: 55 minutes.  Patient at high risk for pulmonary arrest.  Carlus Stay M.D on 09/25/2015 at 12:19 PM  Between 7am to 6pm - Pager - [phone] After 6pm go to [email or web address] - password [email or web address] [facility]  Office  [phone]  CC: Primary care physician; Lelon Huh, MD    I have spoke with Dr Delana Meyer who will perform IVCF this evening as he has high chance of mortality without either IVCF or anticoagulation.

## 2015-09-25 NOTE — Op Note (Signed)
[facility]   OPERATIVE NOTE    PRE-OPERATIVE DIAGNOSIS: Pulmonary embolism, hematuria, anemia of blood loss, prostate carcinoma  POST-OPERATIVE DIAGNOSIS: Same  PROCEDURE: 1.   Ultrasound guidance for vascular access to the right femoral vein 2.   Catheter placement into the inferior vena cava 3.   Inferior venacavogram 4.   Placement of a Celect IVC filter  SURGEON: Aris Even, Dolores Lory  ASSISTANT(S): None  ANESTHESIA: local/sedation  ESTIMATED BLOOD LOSS: minimal  FINDING(S): 1.  Patent IVC  SPECIMEN(S):  none  INDICATIONS:   Louis Alexander is a 74 y.o. y.o. male who presents with hematuria while on anticoagulation. He was identified with pulmonary embolism approximately 1 week ago. He is currently undergoing treatment for prostate carcinoma.  Inferior vena cava filter is indicated for this reason.  Risks and benefits including filter thrombosis, migration, fracture, bleeding, and infection were all discussed.  We discussed that all IVC filters that we place can be removed if desired from the patient once the need for the filter has passed.    DESCRIPTION: After obtaining full informed written consent, the patient was brought back to the vascular suite. The skin was sterilely prepped and draped in a sterile surgical field was created. The right common femoral was accessed under direct ultrasound guidance without difficulty with a Seldinger needle and a J-wire was then placed. The dilator is passed over the wire and the delivery sheath was placed into the inferior vena cava.  Inferior venacavogram was performed. This demonstrated a patent IVC with the level of the renal veins at L2.  The filter was then deployed into the inferior vena cava at the level of L2 just below the renal veins. The delivery sheath was then removed. Pressure was held. Sterile dressings were placed. The patient tolerated the procedure well and was taken to the recovery room in stable  condition.  Interpretation: Inferior vena cava is widely patent no evidence of filling defects. Normal renal blushes are noted at the P2-A4 level  COMPLICATIONS: None  CONDITION: Stable  Katha Cabal, MD  09/25/2015, 6:10 PM

## 2015-09-25 NOTE — Progress Notes (Signed)
Patient admitted and oriented to the room. Vitals are stable. Alert and oriented x4. Saturation is 100% on room air. Patient able to stand to be weighed and is not complaining of shortness of breath at this time. Patient is on schedule for IVC filter to be placed. Specials has called for report, stated they will obtain consent downstairs once MD talks to patient. No complaints of pain. Family is at bedside. Orderly on the way to pick patient up for procedure now.

## 2015-09-25 NOTE — Consult Note (Signed)
[facility] Vascular Consult Note  MRN : [id number]  Louis Alexander is a 74 y.o. (22-Sep-1941) male who presents with chief complaint of  Chief Complaint  Patient presents with  . Shortness of Breath  .  History of Present Illness: The patient is a 74 y.o. male with a known history of metastatic prostate cancer, type 2 diabetes, CAD and recent diagnosis of pulmonary emboli who presents with above complaint. Patient was seen by his cardiologist early in November and was complete and shortness of breath. A CT scan showed pulmonary emboli. He was started on ELIQUIS 10 mg by mouth bid. Shortly after starting this he developed hematuria. I will request was decreased to 5 mg by mouth twice a day however patient continues to have hematuria and weakness. He presents with these complaints.  Current Facility-Administered Medications  Medication Dose Route Frequency Provider Last Rate Last Dose  . [MAR Hold] acetaminophen (TYLENOL) tablet 650 mg  650 mg Oral Q6H PRN Bettey Costa, MD       Or  . Doug Sou Hold] acetaminophen (TYLENOL) suppository 650 mg  650 mg Rectal Q6H PRN Bettey Costa, MD      . Doug Sou Hold] alum & mag hydroxide-simeth (MAALOX/MYLANTA) 200-200-20 MG/5ML suspension 30 mL  30 mL Oral Q6H PRN Bettey Costa, MD      . Doug Sou Hold] aspirin EC tablet 81 mg  81 mg Oral Daily Bettey Costa, MD   81 mg at 09/25/15 1615  . [MAR Hold] atenolol (TENORMIN) tablet 25 mg  25 mg Oral Daily Bettey Costa, MD   25 mg at 09/25/15 1615  . [MAR Hold] canagliflozin (INVOKANA) tablet 300 mg  300 mg Oral QAC breakfast Bettey Costa, MD      . Doug Sou Hold] fentaNYL (DURAGESIC - dosed mcg/hr) patch 75 mcg  75 mcg Transdermal Q72H Bettey Costa, MD      . Doug Sou Hold] furosemide (LASIX) tablet 20 mg  20 mg Oral BID Bettey Costa, MD      . Doug Sou Hold] glipiZIDE (GLUCOTROL) tablet 10 mg  10 mg Oral QAC breakfast Bettey Costa, MD      . Doug Sou Hold] HYDROcodone-acetaminophen (NORCO/VICODIN) 5-325 MG per tablet 1-2 tablet   1-2 tablet Oral Q4H PRN Bettey Costa, MD      . Doug Sou Hold] insulin aspart (novoLOG) injection 0-5 Units  0-5 Units Subcutaneous QHS Bettey Costa, MD      . Doug Sou Hold] insulin aspart (novoLOG) injection 0-9 Units  0-9 Units Subcutaneous TID WC Bettey Costa, MD   0 Units at 09/25/15 1700  . [MAR Hold] mometasone-formoterol (DULERA) 100-5 MCG/ACT inhaler 2 puff  2 puff Inhalation BID Bettey Costa, MD      . Doug Sou Hold] multivitamin with minerals tablet 1 tablet  1 tablet Oral Daily Bettey Costa, MD   1 tablet at 09/25/15 1615  . [MAR Hold] ondansetron (ZOFRAN) tablet 4 mg  4 mg Oral Q6H PRN Bettey Costa, MD       Or  . Doug Sou Hold] ondansetron (ZOFRAN) injection 4 mg  4 mg Intravenous Q6H PRN Bettey Costa, MD      . Doug Sou Hold] pantoprazole (PROTONIX) EC tablet 40 mg  40 mg Oral BID Bettey Costa, MD      . Doug Sou Hold] potassium chloride (K-DUR) CR tablet 10 mEq  10 mEq Oral Daily Bettey Costa, MD   10 mEq at 09/25/15 1615  . [MAR Hold] senna-docusate (Senokot-S) tablet 1 tablet  1 tablet Oral QHS PRN Sital  Mody, MD      . Doug Sou Hold] sodium chloride 0.9 % injection 3 mL  3 mL Intravenous Q12H Bettey Costa, MD      . Doug Sou Hold] sotalol (BETAPACE) tablet 80 mg  80 mg Oral Q12H Bettey Costa, MD      . Doug Sou Hold] sucralfate (CARAFATE) tablet 1 g  1 g Oral TID WC & HS Bettey Costa, MD   1 g at 09/25/15 1700  . [MAR Hold] ticagrelor (BRILINTA) tablet 90 mg  90 mg Oral BID Bettey Costa, MD       Facility-Administered Medications Ordered in Other Encounters  Medication Dose Route Frequency Provider Last Rate Last Dose  . chlorhexidine (HIBICLENS) 4 % liquid 4 application  60 mL Topical Once Danae Orleans, PA-C       And  . chlorhexidine (HIBICLENS) 4 % liquid 4 application  60 mL Topical Once Danae Orleans, PA-C      . sodium chloride 0.9 % injection 10 mL  10 mL Intracatheter PRN Lloyd Huger, MD   10 mL at 05/09/15 0900  . sodium chloride 0.9 % injection 3 mL  3 mL Intravenous PRN Lloyd Huger, MD        Past Medical  History  Diagnosis Date  . Abnormal EKG   . Coronary artery disease   . Diabetes mellitus without complication ([place])   . Obesity   . Hyperlipidemia   . Hypertension   . Myocardial infarction ([place]) 2008 and 2012    x 2  . Sleep apnea     could not tolerate cpap mask  . Arthritis     oa  . Cancer [facility])     bladder and prostate  . History of radiation therapy 2012  . History of chemotherapy 2014  . Bladder cancer ([place])   . History of chicken pox     Past Surgical History  Procedure Laterality Date  . Cardiac catheterization  12-06-2003  . Lad stent  2000 x1 stent, 2008 x 1 stent, 2012 x 1 stent  . Hernia repair  yrs ago  . Appendectomy  many yrs ago  . Rotator cuff repair Left yrs ago  . Seed implant to prostate with radiation  2012  . Protatectomy and urostomy  2014  . Total hip arthroplasty Right 12/27/2013    Procedure: RIGHT TOTAL HIP ARTHROPLASTY ANTERIOR APPROACH;  Surgeon: Mauri Pole, MD;  Location: [facility];  Service: Orthopedics;  Laterality: Right;  . Cardiac catheterization N/A 06/04/2015    Procedure: Left Heart Cath and Coronary Angiography;  Surgeon: Corey Skains, MD;  Location: [place] [facility];  Service: Cardiovascular;  Laterality: N/A;  . Cardiac catheterization N/A 06/04/2015    Procedure: Coronary Stent Intervention;  Surgeon: Wellington Hampshire, MD;  Location: [place] [facility];  Service: Cardiovascular;  Laterality: N/A;    Social History Social History  Substance Use Topics  . Smoking status: Never Smoker   . Smokeless tobacco: Never Used  . Alcohol Use: 0.0 oz/week    0 Standard drinks or equivalent per week     Comment: occasional    Family History Family History  Problem Relation Age of Onset  . Stroke Father 105  . Aneurysm Father   . Heart attack Mother   . Lung cancer Other   . Stomach cancer Other     Allergies  Allergen Reactions  . Celebrex [Celecoxib]     Hematuria Other reaction(s): Bleeding Bleeding in bladder  .  Effient [Prasugrel] Other (See Comments)    blood clots, blood in urine Other reaction(s): Bleeding Bleeding in bladder     REVIEW OF SYSTEMS (Negative unless checked)  Constitutional: [] Weight loss  [] Fever  [] Chills Cardiac: [] Chest pain   [] Chest pressure   [] Palpitations   [] Shortness of breath when laying flat   [] Shortness of breath at rest   [] Shortness of breath with exertion. Vascular:  [] Pain in legs with walking   [] Pain in legs at rest   [] Pain in legs when laying flat   [] Claudication   [] Pain in feet when walking  [] Pain in feet at rest  [] Pain in feet when laying flat   [] History of DVT   [] Phlebitis   [] Swelling in legs   [] Varicose veins   [] Non-healing ulcers Pulmonary:   [] Uses home oxygen   [] Productive cough   [] Hemoptysis   [] Wheeze  [] COPD   [] Asthma Neurologic:  [] Dizziness  [] Blackouts   [] Seizures   [] History of stroke   [] History of TIA  [] Aphasia   [] Temporary blindness   [] Dysphagia   [] Weakness or numbness in arms   [] Weakness or numbness in legs Musculoskeletal:  [] Arthritis   [] Joint swelling   [] Joint pain   [] Low back pain Hematologic:  [] Easy bruising  [] Easy bleeding   [] Hypercoagulable state   [] Anemic  [] Hepatitis Gastrointestinal:  [] Blood in stool   [] Vomiting blood  [] Gastroesophageal reflux/heartburn   [] Difficulty swallowing. Genitourinary:  [] Chronic kidney disease   [] Difficult urination  [] Frequent urination  [] Burning with urination   [] Blood in urine Skin:  [] Rashes   [] Ulcers   [] Wounds Psychological:  [] History of anxiety   []  History of major depression.  Physical Examination  Filed Vitals:   09/25/15 1530 09/25/15 1545 09/25/15 1620 09/25/15 1700  BP: 114/81  138/73 119/76  Pulse: 78 82 79 74  Temp:   98.7 F (37.1 C) 98.3 F (36.8 C)  TempSrc:   Oral Oral  Resp:   18 16  Height:   5\' 10"  (1.778 m)   Weight:   83.961 kg (185 lb 1.6 oz)   SpO2: 97% 100% 100% 100%   Body mass index is 26.56 kg/(m^2). Gen:  WD/WN, NAD Head: [place]/AT,  No temporalis wasting. Prominent temp pulse not noted. Ear/Nose/Throat: Hearing grossly intact, nares w/o erythema or drainage, oropharynx w/o Erythema/Exudate Eyes: PERRLA, EOMI.  Neck: Supple, no nuchal rigidity.  No bruit or JVD.  Pulmonary:  Good air movement, clear to auscultation bilaterally.  Cardiac: RRR, normal S1, S2, no Murmurs, rubs or gallops. Vascular: Vessel Right Left  Radial Palpable Palpable  Ulnar Palpable Palpable  Brachial Palpable Palpable  Carotid Palpable, without bruit Palpable, without bruit  Aorta Not palpable N/A  Femoral Palpable Palpable  Popliteal Palpable Palpable  PT Palpable Palpable  DP Palpable Palpable   Gastrointestinal: soft, non-tender/non-distended. No guarding/reflex. No masses, surgical incisions, or scars. Musculoskeletal: M/S 5/5 throughout.  Extremities without ischemic changes.  No deformity or atrophy. 2+ edema. Neurologic: CN 2-12 intact. Pain and light touch intact in extremities.  Symmetrical.  Speech is fluent. Motor exam as listed above. Psychiatric: Judgment intact, Mood & affect appropriate for pt's clinical situation. Dermatologic: No rashes or ulcers noted.  No cellulitis or open wounds. Lymph : No Cervical, Axillary, or Inguinal lymphadenopathy.    CBC Lab Results  Component Value Date   WBC 11.7* 09/25/2015   HGB 8.7* 09/25/2015   HCT 26.3* 09/25/2015   MCV 86.5 09/25/2015   PLT 157 09/25/2015  BMET    Component Value Date/Time   NA 132* 09/18/2015 0908   NA 133* 03/15/2015 0930   NA 139 12/22/2014   K 4.3 09/18/2015 0908   K 4.5 03/15/2015 0930   CL 103 09/18/2015 0908   CL 100* 03/15/2015 0930   CO2 20* 09/18/2015 0908   CO2 26 03/15/2015 0930   GLUCOSE 289* 09/18/2015 0908   GLUCOSE 179* 03/15/2015 0930   BUN 25* 09/18/2015 0908   BUN 21* 03/15/2015 0930   BUN 21 12/22/2014   CREATININE 1.17 09/18/2015 0908   CREATININE 0.96 03/15/2015 0930   CREATININE 1.0 12/22/2014   CALCIUM 8.4* 09/18/2015  0908   CALCIUM 8.7* 03/15/2015 0930   GFRNONAA >60 09/18/2015 0908   GFRNONAA >60 03/15/2015 0930   GFRAA >60 09/18/2015 0908   GFRAA >60 03/15/2015 0930   Estimated Creatinine Clearance: 58.1 mL/min (by C-G formula based on Cr of 1.17).  COAG Lab Results  Component Value Date   INR 1.91 09/25/2015   INR 1.12 06/01/2015   INR 0.92 12/21/2013    Radiology Dg Chest 2 View  09/25/2015  CLINICAL DATA:  History of metastatic prostate cancer. The patient was diagnosed with pulmonary embolus 09/21/2015. Increased dyspnea since decreasing anticoagulation therapy. EXAM: CHEST  2 VIEW COMPARISON:  PET CT scan 07/26/2015. FINDINGS: Port-A-Cath is in place. The lungs are clear. Heart size is normal. No pneumothorax or pleural effusion. IMPRESSION: No acute disease. Electronically Signed   By: Inge Rise M.D.   On: 09/25/2015 11:21     Assessment/Plan 1. Pulmonary emboli in the right main pulmonary artery and segmental branches on the left in association with hematuria hematuria and a 0.7 g/dL drop in his hemoglobin since last week.  Therefore the patient is not tolerating anticoagulation and this cannot be continued. However given his significant pulmonary embolism this represents a life-threatening condition. I therefore recommend that he have an IVC filter placed. The risks and benefits were discussed in detail with the patient as well as many of his family all questions of been answered and all are in agreement with proceeding.   2. Hematuria: Patient was recently started on eliquis for pulmonary emboli and then subsequently developed hematuria. He does have a urostomy bag with a history of metastatic prostate cancer. He is also on Brilinta and aspirin secondary to his coronary artery disease. Further workup of his hematuria is deferred to the urology service as well as oncology's input however given this study loss of blood his Eliquis surely must be stopped and thus IVC filter will be placed  to prevent lethal pulmonary embolism  3. Guaiac positive stool: Given patient's pulmonary emboli it is unlikely that GI will want to perform invasive workup. Patient denies hematochezia or melena. Continue to monitor hemoglobin. Continue pantoprazole 40 mg by mouth twice a day  4. History of metastatic prostate cancer: Consultation with oncology and urology. Patient is currently receiving chemotherapy.   5. Acute on chronic anemia with blood loss, acute: Hgb is 8.7 this time patient does not require blood transfusion. Repeat a hemoglobin later this afternoon. Patient has consented for blood transfusion if needed.  6. Type 2 diabetes without complication: Patient is on sliding scale insulin, glipizide and ADA diet.  7. CAD with previous stents: I will obtain cardiology consultation. Patient is on aspirin, Brilinta, simvastatin and sotalol in the setting of hematuria.    Sven Pinheiro, Dolores Lory, MD  09/25/2015 6:13 PM

## 2015-09-25 NOTE — Progress Notes (Signed)
Patient back from IVC filter placement. Vitals are stable, no pain. Patient will be able to sit up at 1900 per specials RN. Site is stable. PAD dressing is to be removed tomorrow. Will pass on.

## 2015-09-25 NOTE — ED Notes (Addendum)
Pt states he was dx with bilateral PE's last week, states he continues to have sob that is worse with ambulation and activity, denies any pain, pt currently on chemo treatments

## 2015-09-25 NOTE — Progress Notes (Signed)
Date: 09/25/2015,   MRN# [id number] Louis Alexander June 21, 1941 Code Status:     Code Status Orders        Start     Ordered   09/25/15 1614  Full code   Continuous     09/25/15 1613     Hosp day:@LENGTHOFSTAYDAYS @ Referring MD: @ATDPROV @        CC: Pulmonary emboli and hematuria  HPI: This is a 74 yr old male, here with sob, saw cardiology, chest ct done showed right main stem and segmental branches on the left. Eliquis was started, shortly after developed hematuria, Eliquis decreased but still bleeding. He has metastatic prostate cancer on chemotherapy. Cardiology, vascular, urology to see.   PMHX:   Past Medical History  Diagnosis Date  . Abnormal EKG   . Coronary artery disease   . Diabetes mellitus without complication ([place])   . Obesity   . Hyperlipidemia   . Hypertension   . Myocardial infarction ([place]) 2008 and 2012    x 2  . Sleep apnea     could not tolerate cpap mask  . Arthritis     oa  . Cancer [facility])     bladder and prostate  . History of radiation therapy 2012  . History of chemotherapy 2014  . Bladder cancer ([place])   . History of chicken pox    Surgical Hx:  Past Surgical History  Procedure Laterality Date  . Cardiac catheterization  12-06-2003  . Lad stent  2000 x1 stent, 2008 x 1 stent, 2012 x 1 stent  . Hernia repair  yrs ago  . Appendectomy  many yrs ago  . Rotator cuff repair Left yrs ago  . Seed implant to prostate with radiation  2012  . Protatectomy and urostomy  2014  . Total hip arthroplasty Right 12/27/2013    Procedure: RIGHT TOTAL HIP ARTHROPLASTY ANTERIOR APPROACH;  Surgeon: Mauri Pole, MD;  [facility];  Service: Orthopedics;  Laterality: Right;  . Cardiac catheterization N/A 06/04/2015    Procedure: Left Heart Cath and Coronary Angiography;  Surgeon: Corey Skains, MD;  Location: [place] [facility];  Service: Cardiovascular;  Laterality: N/A;  . Cardiac catheterization N/A 06/04/2015    Procedure: Coronary Stent  Intervention;  Surgeon: Wellington Hampshire, MD;  Location: [place] [facility];  Service: Cardiovascular;  Laterality: N/A;   Family Hx:  Family History  Problem Relation Age of Onset  . Stroke Father 20  . Aneurysm Father   . Heart attack Mother   . Lung cancer Other   . Stomach cancer Other    Social Hx:   Social History  Substance Use Topics  . Smoking status: Never Smoker   . Smokeless tobacco: Never Used  . Alcohol Use: 0.0 oz/week    0 Standard drinks or equivalent per week     Comment: occasional   Medication:    Home Medication:  No current outpatient prescriptions on file.  Current Medication: @CURMEDTAB @   Allergies:  Celebrex and Effient  Review of Systems: Gen:  Denies  fever, sweats, chills HEENT: Denies blurred vision, double vision, ear pain, eye pain, hearing loss, nose bleeds, sore throat Cvc:  No dizziness, chest pain or heaviness Resp: shortness of breath, no pleurisy, no wheezing   Gi: Denies swallowing difficulty, stomach pain, nausea or vomiting, diarrhea, constipation, bowel incontinence Gu:  Denies bladder incontinence, burning urine Ext:   No Joint pain, stiffness or swelling Skin: No skin rash, easy bruising or  bleeding or hives Endoc:  No polyuria, polydipsia , polyphagia or weight change Psych: No depression, insomnia or hallucinations  Other:  All other systems negative  Physical Examination:   VS: BP 138/73 mmHg  Pulse 79  Temp(Src) 98.7 F (37.1 C) (Oral)  Resp 18  Ht 5\' 10"  (1.778 m)  Wt 185 lb 1.6 oz (83.961 kg)  BMI 26.56 kg/m2  SpO2 100%  General Appearance: frial, weak   Neuro/psych; without focal findings, mental status, speech normal, alert and oriented, cranial nerves 2-12 intact, reflexes normal and symmetric, sensation grossly normal  HEENT: PERRLA, EOM intact, no ptosis, no other lesions noticed NECK: No stridor, no jvd Pulmonary:.No wheezing, No rales  :    Cardiovascular:  Normal S1,S2.  No m/r/g.  Abdomen:Benign,  Soft, non-tender, No masses, hepatosplenomegaly, No lymphadenopathy Endoc: No evident thyromegaly, no signs of acromegaly or Cushing features Skin:   warm, no rashes, no ecchymosis  Extremities: normal, no cyanosis, clubbing, no edema, warm with normal capillary refill.    Labs results:   Recent Labs     09/25/15  1025  HGB  8.7*  HCT  26.3*  MCV  86.5  WBC  11.7*  INR  1.91  ,   Rad results:   Dg Chest 2 View  09/25/2015  CLINICAL DATA:  History of metastatic prostate cancer. The patient was diagnosed with pulmonary embolus 09/21/2015. Increased dyspnea since decreasing anticoagulation therapy. EXAM: CHEST  2 VIEW COMPARISON:  PET CT scan 07/26/2015. FINDINGS: Port-A-Cath is in place. The lungs are clear. Heart size is normal. No pneumothorax or pleural effusion. IMPRESSION: No acute disease. Electronically Signed   By: Inge Rise M.D.   On: 09/25/2015 11:21   Assessment and Plan: Bilateral pulmonary emboli, gross hematuria and metastatic prostate cancer. The later is most likely a contributing risk factor. Relative contraindication to thrombolytic, recognizing there not an indication for it at this time.   I agree with ivc filter but he will need anticoagulation in order to prevent in situ embolization.  If ok with cardiology to stop asa and brilinta and restart Eliquis as hematuria subsides.  Following     I have personally obtained a history, examined the patient, evaluated laboratory and imaging results, formulated the assessment and plan and placed orders.  The Patient requires high complexity decision making for assessment and support, frequent evaluation and titration of therapies, application of advanced monitoring technologies and extensive interpretation of multiple databases.   Herbon Fleming,M.D. Pulmonary & Critical care Medicine [facility]

## 2015-09-25 NOTE — ED Provider Notes (Addendum)
[facility] Emergency Department Provider Note  ____________________________________________   I have reviewed the triage vital signs and the nursing notes.   HISTORY  Chief Complaint Shortness of Breath    HPI Louis Alexander is a 74 y.o. male who unfortunately has metastatic prostate cancer diabetes mellitus CAD paroxysmal atrial fibrillation on chemotherapy and radiation, who has been diagnosed with a PE, bilaterally and a CTA chest performed November 4 is an outpatient, results of which come with the patient demonstrating a proximal right main pulmonary artery and segmental and subsegmental left pulmonary emboli. The patient has been on Eliquis at home they gave him 10 mg twice a day but that was too much she began to have hematuria which was significant they then went down to 5 mg twice a day. The hematuria is improved however his dyspnea is getting to the point where he cannot get around the house. He lives with his wife unfortunately she is admitted to the hospital last night with a broken hip and he no longer can be there on his own. He has no home health nursing set up. His oncologist, Dr. Chancy Milroy sent the patient in for admission to the hospital for all these reasons. They also would like to set the patient Lovenox. He did have his Eliquis dose this morning. Patient has a urostomy  Past Medical History  Diagnosis Date  . Abnormal EKG   . Coronary artery disease   . Diabetes mellitus without complication ([place])   . Obesity   . Hyperlipidemia   . Hypertension   . Myocardial infarction ([place]) 2008 and 2012    x 2  . Sleep apnea     could not tolerate cpap mask  . Arthritis     oa  . Cancer [facility])     bladder and prostate  . History of radiation therapy 2012  . History of chemotherapy 2014  . Bladder cancer ([place])   . History of chicken pox     Patient Active Problem List   Diagnosis Date Noted  . Atrial fibrillation ([place]) 09/13/2015  . Bladder  cancer ([place]) 06/11/2015  . Dyslipidemia 06/11/2015  . Arthralgia of hip 06/11/2015  . History of myocardial infarction 06/11/2015  . Degenerative arthritis of hip 06/11/2015  . Urothelial cancer ([place]) 03/16/2015  . S/P right THA, AA 12/27/2013  . Cancer of lateral wall of urinary bladder ([place]) 01/28/2013  . Prostate cancer ([place]) 01/14/2013  . Incomplete bladder emptying 01/14/2013  . Frank hematuria 01/14/2013  . Difficult or painful urination 01/14/2013  . History of colon polyps 11/01/2010  . 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 Diabetes mellitus type 2, uncontrolled ([place]) 08/31/2008  . 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 Benign prostatic hypertrophy without urinary obstruction 07/31/2008  . Coronary artery disease 07/31/2008  . Disturbance of skin sensation 07/26/2008  . Essential (primary) hypertension 07/26/2008  . Idiopathic peripheral autonomic neuropathy 07/26/2008    Past Surgical History  Procedure Laterality Date  . Cardiac catheterization  12-06-2003  . Lad stent  2000 x1 stent, 2008 x 1 stent, 2012 x 1 stent  . Hernia repair  yrs ago  . Appendectomy  many yrs ago  . Rotator cuff repair Left yrs ago  . Seed implant to prostate with radiation  2012  . Protatectomy and urostomy  2014  . Total hip arthroplasty Right 12/27/2013    Procedure: RIGHT TOTAL HIP ARTHROPLASTY ANTERIOR APPROACH;  Surgeon: Mauri Pole, MD;  Location: [facility];  Service: Orthopedics;  Laterality: Right;  . Cardiac catheterization N/A 06/04/2015    Procedure: Left Heart Cath and Coronary Angiography;  Surgeon: Corey Skains, MD;  Location: [place] CV LAB;  Service: Cardiovascular;  Laterality: N/A;  . Cardiac catheterization N/A 06/04/2015    Procedure: Coronary Stent Intervention;  Surgeon: Wellington Hampshire, MD;  Location: [name] CV LAB;  Service: Cardiovascular;  Laterality: N/A;    Current Outpatient Rx  Name  Route  Sig  Dispense   Refill  . aspirin EC 81 MG tablet   Oral   Take 81 mg by mouth daily.         [name] atorvastatin (LIPITOR) 40 MG tablet   Oral   Take 40 mg by mouth every evening.         . canagliflozin (INVOKANA) 300 MG TABS tablet   Oral   Take 300 mg by mouth daily before breakfast. Patient not taking: Reported on 09/18/2015   30 tablet   3   . Coenzyme Q10 10 MG capsule   Oral   Take 10 mg by mouth.         . docusate sodium 100 MG CAPS   Oral   Take 100 mg by mouth 2 (two) times daily. Patient not taking: Reported on 09/18/2015   10 capsule   0   . fentaNYL (DURAGESIC - DOSED MCG/HR) 75 MCG/HR   Transdermal   Place 1 patch (75 mcg total) onto the skin every 3 (three) days.   10 patch   0   . Fluticasone Furoate-Vilanterol (BREO ELLIPTA) 100-25 MCG/INH AEPB   Inhalation   Inhale 1 Inhaler into the lungs daily.         . furosemide (LASIX) 20 MG tablet   Oral   Take 20 mg by mouth 2 (two) times daily.         [name] glipiZIDE (GLUCOTROL) 10 MG tablet      TAKE 1 TABLET(S) BY MOUTH DAILY   30 tablet   6     CYCLE FILL MEDICATION. Authorization is required f ...   . losartan (COZAAR) 100 MG tablet   Oral   Take 1 tablet by mouth daily.         . metFORMIN (GLUCOPHAGE-XR) 500 MG 24 hr tablet      2 (TWO) TABLET, ORAL, TWO TIMES DAILY   120 tablet   11   . Multiple Vitamin (MULTIVITAMIN WITH MINERALS) TABS tablet   Oral   Take 1 tablet by mouth daily.         . naproxen sodium (ALEVE) 220 MG tablet   Oral   Take 1 tablet by mouth daily as needed.         . prochlorperazine (COMPAZINE) 10 MG tablet      TAKE 1 TABLET BY MOUTH EVERY 6 HOURS AS NEEDED   40 tablet   1     No refills available   . sotalol (BETAPACE) 80 MG tablet   Oral   Take by mouth 2 (two) times daily.         . ticagrelor (BRILINTA) 90 MG TABS tablet   Oral   Take by mouth 2 (two) times daily.           Allergies Celebrex and Effient  Family History  Problem Relation  Age of Onset  . Stroke Father 24  . Aneurysm Father   . Heart attack Mother   . Lung  cancer Other   . Stomach cancer Other     Social History Social History  Substance Use Topics  . Smoking status: Never Smoker   . Smokeless tobacco: Never Used  . Alcohol Use: 0.0 oz/week    0 Standard drinks or equivalent per week     Comment: occasional    Review of Systems Constitutional: No fever/chills Eyes: No visual changes. ENT: No sore throat. No stiff neck no neck pain Cardiovascular: Denies chest pain. Respiratory: Positive shortness of breath. Gastrointestinal:   no vomiting.  No diarrhea.  No constipation. Genitourinary: Negative for dysuria. Musculoskeletal: Negative lower extremity swelling Skin: Negative for rash. Neurological: Negative for headaches, focal weakness or numbness. 10-point ROS otherwise negative.  ____________________________________________   PHYSICAL EXAM:  VITAL SIGNS: ED Triage Vitals  Enc Vitals Group     BP 09/25/15 1004 127/69 mmHg     Pulse Rate 09/25/15 1004 78     Resp 09/25/15 1004 20     Temp --      Temp src --      SpO2 09/25/15 1004 100 %     Weight 09/25/15 1004 195 lb (88.451 kg)     Height 09/25/15 1004 5\' 10"  (1.778 m)     Head Cir --      Peak Flow --      Pain Score 09/25/15 1005 0     Pain Loc --      Pain Edu? --      Excl. in [place]? --     Constitutional: Alert and oriented. Chronically ill and in no acute distress. Eyes: Conjunctivae are normal. PERRL. EOMI. Head: Atraumatic. Nose: No congestion/rhinnorhea. Mouth/Throat: Mucous membranes are moist.  Oropharynx non-erythematous. Neck: No stridor.   Nontender with no meningismus Cardiovascular: Normal rate, regular rhythm. Grossly normal heart sounds.  Good peripheral circulation. Respiratory: Normal respiratory effort.  No retractions. Lungs CTAB. Abdominal: Soft and nontender. No distention. No guarding no rebound Back:  There is no focal tenderness or step off there  is no midline tenderness there are no lesions noted. there is no CVA tenderness Musculoskeletal: No lower extremity tenderness. No joint effusions, no DVT signs strong distal pulses no edema Neurologic:  Normal speech and language. No gross focal neurologic deficits are appreciated.  Skin:  Skin is warm, dry and intact. No rash noted. Psychiatric: Mood and affect are normal. Speech and behavior are normal.  ____________________________________________   LABS (all labs ordered are listed, but only abnormal results are displayed)  Labs Reviewed  CBC WITH DIFFERENTIAL/PLATELET  PROTIME-INR  APTT  URINALYSIS COMPLETEWITH MICROSCOPIC ([facility] ONLY)  TYPE AND SCREEN   ____________________________________________  EKG  I personally interpreted any EKGs ordered by me or triage Normal sinus rhythm rate 82 bpm no acute ST elevation or acute ST depression normal axis unremarkable EKG ____________________________________________  RADIOLOGY  I reviewed any imaging ordered by me or triage that were performed during my shift ____________________________________________   PROCEDURES  Procedure(s) performed: None  Critical Care performed: None  ____________________________________________   INITIAL IMPRESSION / ASSESSMENT AND PLAN / ED COURSE  Pertinent labs & imaging results that were available during my care of the patient were reviewed by me and considered in my medical decision making (see chart for details).  Patient presents with a known bilateral PE including right main with exertional dyspnea to the point that he cannot get around his house. He has no home health, his wife cannot help from their his oncologist wants him admitted for  Lovenox and observation to make sure that he does not have too much bleeding from the Lovenox for his PE as they are unable to give him sufficient quantities of Eliquis given  hematuria. ____________________________________________   ----------------------------------------- 11:52 AM on 09/25/2015 -----------------------------------------  D/w Dr. Chancy Milroy, who states US of heart shows no strain.  Will admit.  Given his INR and recent Eliquis this am and anemia, I will defer lovenox until after admission.  Patient is guaiac positive, faintly. Hospitalist aware.  FINAL CLINICAL IMPRESSION(S) / ED DIAGNOSES  Final diagnoses:  None     Schuyler Amor, MD 09/25/15 1047  Schuyler Amor, MD 09/25/15 [place], MD 09/25/15 1218

## 2015-09-25 NOTE — OR Nursing (Signed)
Report caled to [facility]

## 2015-09-26 ENCOUNTER — Other Ambulatory Visit: Payer: Self-pay | Admitting: *Deleted

## 2015-09-26 ENCOUNTER — Inpatient Hospital Stay: Payer: PPO

## 2015-09-26 DIAGNOSIS — Z9861 Coronary angioplasty status: Secondary | ICD-10-CM

## 2015-09-26 DIAGNOSIS — E119 Type 2 diabetes mellitus without complications: Secondary | ICD-10-CM

## 2015-09-26 DIAGNOSIS — Z9221 Personal history of antineoplastic chemotherapy: Secondary | ICD-10-CM

## 2015-09-26 DIAGNOSIS — Z923 Personal history of irradiation: Secondary | ICD-10-CM

## 2015-09-26 DIAGNOSIS — Z7982 Long term (current) use of aspirin: Secondary | ICD-10-CM

## 2015-09-26 DIAGNOSIS — I2699 Other pulmonary embolism without acute cor pulmonale: Secondary | ICD-10-CM

## 2015-09-26 DIAGNOSIS — D696 Thrombocytopenia, unspecified: Secondary | ICD-10-CM

## 2015-09-26 DIAGNOSIS — Z800 Family history of malignant neoplasm of digestive organs: Secondary | ICD-10-CM

## 2015-09-26 DIAGNOSIS — R531 Weakness: Secondary | ICD-10-CM

## 2015-09-26 DIAGNOSIS — R310 Gross hematuria: Secondary | ICD-10-CM

## 2015-09-26 DIAGNOSIS — I214 Non-ST elevation (NSTEMI) myocardial infarction: Secondary | ICD-10-CM

## 2015-09-26 DIAGNOSIS — I10 Essential (primary) hypertension: Secondary | ICD-10-CM

## 2015-09-26 DIAGNOSIS — D6489 Other specified anemias: Secondary | ICD-10-CM

## 2015-09-26 DIAGNOSIS — I252 Old myocardial infarction: Secondary | ICD-10-CM

## 2015-09-26 DIAGNOSIS — Z801 Family history of malignant neoplasm of trachea, bronchus and lung: Secondary | ICD-10-CM

## 2015-09-26 DIAGNOSIS — M1990 Unspecified osteoarthritis, unspecified site: Secondary | ICD-10-CM

## 2015-09-26 DIAGNOSIS — Z7984 Long term (current) use of oral hypoglycemic drugs: Secondary | ICD-10-CM

## 2015-09-26 DIAGNOSIS — Z79899 Other long term (current) drug therapy: Secondary | ICD-10-CM

## 2015-09-26 DIAGNOSIS — C689 Malignant neoplasm of urinary organ, unspecified: Principal | ICD-10-CM

## 2015-09-26 DIAGNOSIS — E669 Obesity, unspecified: Secondary | ICD-10-CM

## 2015-09-26 DIAGNOSIS — R5383 Other fatigue: Secondary | ICD-10-CM

## 2015-09-26 DIAGNOSIS — E785 Hyperlipidemia, unspecified: Secondary | ICD-10-CM

## 2015-09-26 DIAGNOSIS — I2510 Atherosclerotic heart disease of native coronary artery without angina pectoris: Secondary | ICD-10-CM

## 2015-09-26 DIAGNOSIS — G4730 Sleep apnea, unspecified: Secondary | ICD-10-CM

## 2015-09-26 LAB — BASIC METABOLIC PANEL
Anion gap: 8 (ref 5–15)
BUN: 27 mg/dL — AB (ref 6–20)
CO2: 20 mmol/L — ABNORMAL LOW (ref 22–32)
CREATININE: 1.92 mg/dL — AB (ref 0.61–1.24)
Calcium: 9.1 mg/dL (ref 8.9–10.3)
Chloride: 102 mmol/L (ref 101–111)
GFR calc Af Amer: 38 mL/min — ABNORMAL LOW (ref >60)
GFR calc non Af Amer: 33 mL/min — ABNORMAL LOW (ref >60)
GLUCOSE: 205 mg/dL — AB (ref 65–99)
POTASSIUM: 4.2 mmol/L (ref 3.5–5.1)
Sodium: 130 mmol/L — ABNORMAL LOW (ref 135–145)

## 2015-09-26 LAB — APTT
APTT: 47 s — AB (ref 24–36)
aPTT: 27 seconds (ref 24–36)

## 2015-09-26 LAB — CBC
HCT: 24.2 % — ABNORMAL LOW (ref 40.0–52.0)
HEMOGLOBIN: 8.2 g/dL — AB (ref 13.0–18.0)
MCH: 29.1 pg (ref 26.0–34.0)
MCHC: 34 g/dL (ref 32.0–36.0)
MCV: 85.6 fL (ref 80.0–100.0)
PLATELETS: 128 10*3/uL — AB (ref 150–440)
RBC: 2.82 MIL/uL — AB (ref 4.40–5.90)
RDW: 18.6 % — ABNORMAL HIGH (ref 11.5–14.5)
WBC: 10.5 10*3/uL (ref 3.8–10.6)

## 2015-09-26 LAB — GLUCOSE, CAPILLARY
GLUCOSE-CAPILLARY: 196 mg/dL — AB (ref 65–99)
Glucose-Capillary: 187 mg/dL — ABNORMAL HIGH (ref 65–99)
Glucose-Capillary: 289 mg/dL — ABNORMAL HIGH (ref 65–99)
Glucose-Capillary: 256 mg/dL — ABNORMAL HIGH (ref 65–99)

## 2015-09-26 LAB — PROTIME-INR
INR: 1.44
PROTHROMBIN TIME: 17.6 s — AB (ref 11.4–15.0)

## 2015-09-26 LAB — HEPARIN LEVEL (UNFRACTIONATED): HEPARIN UNFRACTIONATED: 2.4 [IU]/mL — AB (ref 0.30–0.70)

## 2015-09-26 MED ORDER — HEPARIN (PORCINE) IN NACL 100-0.45 UNIT/ML-% IJ SOLN
1500.0000 [IU]/h | INTRAMUSCULAR | Status: DC
  Administered 2015-09-26: 1300 [IU]/h via INTRAVENOUS
  Administered 2015-09-27: 1400 [IU]/h via INTRAVENOUS
  Filled 2015-09-26 (×4): qty 250

## 2015-09-26 MED ORDER — SODIUM CHLORIDE 0.9 % IJ SOLN
10.0000 mL | Freq: Two times a day (BID) | INTRAMUSCULAR | Status: DC
  Administered 2015-09-26 – 2015-09-30 (×8): 10 mL

## 2015-09-26 MED ORDER — SODIUM CHLORIDE 0.9 % IV SOLN
INTRAVENOUS | Status: DC
  Administered 2015-09-26 (×2): via INTRAVENOUS

## 2015-09-26 MED ORDER — SODIUM CHLORIDE 0.9 % IJ SOLN
10.0000 mL | INTRAMUSCULAR | Status: DC | PRN
  Administered 2015-09-26 – 2015-09-28 (×2): 20 mL
  Filled 2015-09-26 (×2): qty 40

## 2015-09-26 MED ORDER — HEPARIN BOLUS VIA INFUSION
4000.0000 [IU] | Freq: Once | INTRAVENOUS | Status: AC
  Administered 2015-09-26: 4000 [IU] via INTRAVENOUS
  Filled 2015-09-26: qty 4000

## 2015-09-26 NOTE — Consult Note (Signed)
Urology Consult  Referring physician: Dr. Bridgett Larsson Reason for referral: Gross hematuria  Chief Complaint: gross hematuria  History of Present Illness: Louis Alexander is a 74yo with a hx of metastatic urothelial carcinoma admitted with gross hematuria. He is followed by Dr. Grayland Ormond in medical oncology and is currently getting palliative chemotherapy. He has a recent PET CT which shows progression of his disease. He was followed by Dr Jacqlyn Larsen but the pt has not seen him in 2 yeas due to insurance issues. He was recently started on Eliquis for PE and within 24 hours of starting the medication he began having gross hematuria. He has never had hematuria prior to this event.  He is also on Brilinta. He does not have a history of recurrent UTIs. He has a urostomy for urinary drainage.  He denies any new pain. He had an IVC filter placed last night.   Past Medical History  Diagnosis Date  . Abnormal EKG   . Coronary artery disease   . Diabetes mellitus without complication ([place])   . Obesity   . Hyperlipidemia   . Hypertension   . Myocardial infarction ([place]) 2008 and 2012    x 2  . Sleep apnea     could not tolerate cpap mask  . Arthritis     oa  . Cancer [facility])     bladder and prostate  . History of radiation therapy 2012  . History of chemotherapy 2014  . Bladder cancer ([place])   . History of chicken pox    Past Surgical History  Procedure Laterality Date  . Cardiac catheterization  12-06-2003  . Lad stent  2000 x1 stent, 2008 x 1 stent, 2012 x 1 stent  . Hernia repair  yrs ago  . Appendectomy  many yrs ago  . Rotator cuff repair Left yrs ago  . Seed implant to prostate with radiation  2012  . Protatectomy and urostomy  2014  . Total hip arthroplasty Right 12/27/2013    Procedure: RIGHT TOTAL HIP ARTHROPLASTY ANTERIOR APPROACH;  Surgeon: Mauri Pole, MD;  [facility];  Service: Orthopedics;  Laterality: Right;  . Cardiac catheterization N/A 06/04/2015    Procedure: Left Heart Cath and  Coronary Angiography;  Surgeon: Corey Skains, MD;  Location: [place] CV LAB;  Service: Cardiovascular;  Laterality: N/A;  . Cardiac catheterization N/A 06/04/2015    Procedure: Coronary Stent Intervention;  Surgeon: Wellington Hampshire, MD;  Location: [place] CV LAB;  Service: Cardiovascular;  Laterality: N/A;    Medications: I have reviewed the patient's current medications. Allergies:  Allergies  Allergen Reactions  . Celebrex [Celecoxib]     Hematuria Other reaction(s): Bleeding Bleeding in bladder  . Effient [Prasugrel] Other (See Comments)    blood clots, blood in urine Other reaction(s): Bleeding Bleeding in bladder    Family History  Problem Relation Age of Onset  . Stroke Father 11  . Aneurysm Father   . Heart attack Mother   . Lung cancer Other   . Stomach cancer Other    Social History:  reports that he has never smoked. He has never used smokeless tobacco. He reports that he drinks alcohol. He reports that he does not use illicit drugs.  Review of Systems  Constitutional: Positive for malaise/fatigue.  Gastrointestinal: Positive for nausea.  Genitourinary: Positive for hematuria.  Neurological: Positive for weakness.  Endo/Heme/Allergies: Bruises/bleeds easily.  All other systems reviewed and are negative.   Physical Exam:  Vital signs in last 24  hours: Temp:  [97.9 F (36.6 C)-98.7 F (37.1 C)] 98.6 F (37 C) (11/09 1101) Pulse Rate:  [70-87] 72 (11/09 1101) Resp:  [16-19] 19 (11/09 1101) BP: (112-138)/(65-91) 129/70 mmHg (11/09 1101) SpO2:  [97 %-100 %] 97 % (11/09 0911) Weight:  [83.961 kg (185 lb 1.6 oz)] 83.961 kg (185 lb 1.6 oz) (11/08 1620) Physical Exam  Constitutional: He is oriented to person, place, and time. He appears well-developed and well-nourished.  HENT:  Head: Normocephalic and atraumatic.  Eyes: EOM are normal. Pupils are equal, round, and reactive to light.  Neck: Normal range of motion. No thyromegaly present.   Cardiovascular: Normal rate and regular rhythm.   Respiratory: Effort normal. No respiratory distress.  GI: Soft. He exhibits no distension.  Urostomy patent with light pink urine draining from urostomy  Genitourinary: Penis normal.  Musculoskeletal: Normal range of motion.  Neurological: He is alert and oriented to person, place, and time.  Skin: Skin is warm and dry.  Psychiatric: He has a normal mood and affect. His behavior is normal. Thought content normal.    Laboratory Data:  Results for orders placed or performed during the hospital encounter of 09/25/15 (from the past 72 hour(s))  CBC with Differential     Status: Abnormal   Collection Time: 09/25/15 10:25 AM  Result Value Ref Range   WBC 11.7 (H) 3.8 - 10.6 K/uL   RBC 3.03 (L) 4.40 - 5.90 MIL/uL   Hemoglobin 8.7 (L) 13.0 - 18.0 g/dL   HCT 26.3 (L) 40.0 - 52.0 %   MCV 86.5 80.0 - 100.0 fL   MCH 28.6 26.0 - 34.0 pg   MCHC 33.0 32.0 - 36.0 g/dL   RDW 18.1 (H) 11.5 - 14.5 %   Platelets 157 150 - 440 K/uL   Neutrophils Relative % 83 %   Lymphocytes Relative 6 %   Monocytes Relative 7 %   Eosinophils Relative 1 %   Basophils Relative 0 %   Band Neutrophils 2 %   Metamyelocytes Relative 1 %   Myelocytes 0 %   Promyelocytes Absolute 0 %   Blasts 0 %   nRBC 0 0 /100 WBC   Other 0 %   Neutro Abs 10.1 (H) 1.4 - 6.5 K/uL   Lymphs Abs 0.7 (L) 1.0 - 3.6 K/uL   Monocytes Absolute 0.8 0.2 - 1.0 K/uL   Eosinophils Absolute 0.1 0 - 0.7 K/uL   Basophils Absolute 0.0 0 - 0.1 K/uL   Smear Review PLATELETS APPEAR ADEQUATE     Comment: MORPHOLOGY UNREMARKABLE  Protime-INR     Status: Abnormal   Collection Time: 09/25/15 10:25 AM  Result Value Ref Range   Prothrombin Time 22.0 (H) 11.4 - 15.0 seconds   INR 1.91   APTT     Status: None   Collection Time: 09/25/15 10:25 AM  Result Value Ref Range   aPTT 31 24 - 36 seconds  Type and screen [facility]     Status: None   Collection Time: 09/25/15 11:16 AM   Result Value Ref Range   ABO/RH(D) O NEG    Antibody Screen NEG    Sample Expiration 09/28/2015   Urinalysis complete, with microscopic     Status: Abnormal   Collection Time: 09/25/15 11:16 AM  Result Value Ref Range   Color, Urine RED (A) YELLOW   APPearance CLOUDY (A) CLEAR   Glucose, UA >500 (A) NEGATIVE mg/dL   Bilirubin Urine NEGATIVE NEGATIVE   Ketones, ur  1+ (A) NEGATIVE mg/dL   Specific Gravity, Urine 1.015 1.005 - 1.030   Hgb urine dipstick 3+ (A) NEGATIVE   pH 5.0 5.0 - 8.0   Protein, ur 100 (A) NEGATIVE mg/dL   Nitrite NEGATIVE NEGATIVE   Leukocytes, UA NEGATIVE NEGATIVE   RBC / HPF TOO NUMEROUS TO COUNT 0 - 5 RBC/hpf   WBC, UA TOO NUMEROUS TO COUNT 0 - 5 WBC/hpf   Bacteria, UA MANY (A) NONE SEEN   Squamous Epithelial / LPF NONE SEEN NONE SEEN   Mucous PRESENT   ABO/Rh     Status: None   Collection Time: 09/25/15 11:17 AM  Result Value Ref Range   ABO/RH(D) O NEG   Hemoglobin     Status: Abnormal   Collection Time: 09/25/15  6:29 PM  Result Value Ref Range   Hemoglobin 8.0 (L) 13.0 - 18.0 g/dL  Glucose, capillary     Status: Abnormal   Collection Time: 09/25/15  9:06 PM  Result Value Ref Range   Glucose-Capillary 255 (H) 65 - 99 mg/dL   Comment 1 Notify RN   Basic metabolic panel     Status: Abnormal   Collection Time: 09/26/15  6:48 AM  Result Value Ref Range   Sodium 130 (L) 135 - 145 mmol/L   Potassium 4.2 3.5 - 5.1 mmol/L   Chloride 102 101 - 111 mmol/L   CO2 20 (L) 22 - 32 mmol/L   Glucose, Bld 205 (H) 65 - 99 mg/dL   BUN 27 (H) 6 - 20 mg/dL   Creatinine, Ser 1.92 (H) 0.61 - 1.24 mg/dL   Calcium 9.1 8.9 - 10.3 mg/dL   GFR calc non Af Amer 33 (L) >60 mL/min   GFR calc Af Amer 38 (L) >60 mL/min    Comment: (NOTE) The eGFR has been calculated using the CKD EPI equation. This calculation has not been validated in all clinical situations. eGFR's persistently <60 mL/min signify possible Chronic Kidney Disease.    Anion gap 8 5 - 15  CBC     Status:  Abnormal   Collection Time: 09/26/15  6:48 AM  Result Value Ref Range   WBC 10.5 3.8 - 10.6 K/uL   RBC 2.82 (L) 4.40 - 5.90 MIL/uL   Hemoglobin 8.2 (L) 13.0 - 18.0 g/dL   HCT 24.2 (L) 40.0 - 52.0 %   MCV 85.6 80.0 - 100.0 fL   MCH 29.1 26.0 - 34.0 pg   MCHC 34.0 32.0 - 36.0 g/dL   RDW 18.6 (H) 11.5 - 14.5 %   Platelets 128 (L) 150 - 440 K/uL  Glucose, capillary     Status: Abnormal   Collection Time: 09/26/15  7:25 AM  Result Value Ref Range   Glucose-Capillary 187 (H) 65 - 99 mg/dL  Glucose, capillary     Status: Abnormal   Collection Time: 09/26/15 11:03 AM  Result Value Ref Range   Glucose-Capillary 289 (H) 65 - 99 mg/dL  APTT     Status: None   Collection Time: 09/26/15 11:05 AM  Result Value Ref Range   aPTT 27 24 - 36 seconds  Protime-INR     Status: Abnormal   Collection Time: 09/26/15 11:05 AM  Result Value Ref Range   Prothrombin Time 17.6 (H) 11.4 - 15.0 seconds   INR 1.44    No results found for this or any previous visit (from the past 240 hour(s)). Creatinine:  Recent Labs  09/26/15 0648  CREATININE 1.92*   Baseline Creatinine:  unknown  Impression/Assessment:  74yo with metastatic transitional cell carcinoma and gross hematuria  Plan:  1. Hematuria likely related to eliquis and will likely resolve spontaneously with discontinuing the medication 2. Please send urine for culture. Please do not treat urine culture unless hematuria falls to resolve int he next 3 days 3. Urology to continue to follow 4. Pt will require followup with [facility] 2 weeks after discharge  Kizer Nobbe, [place] 09/26/2015, 12:47 PM

## 2015-09-26 NOTE — Progress Notes (Signed)
Primary Cardiologist: Dr. Neoma Laming    Reason for Consultation : CAD treatment in setting of hematuria    HPI : This is a 16yoM well known to our practice with PMHx of HTN, HL, CAD s/p PCI 05/2015, a-fib, metastatic prostate cancer whom we sent to [facility] from our practice yesterday in setting of worsening SOB 2/2 PE and development of hematuria on eliquis tx for PE. Today he is s/p IVC filter placement and states he is feeling much better.         Review of Systems: General: negative for chills, fever, night sweats or weight changes.  Cardiovascular: negative for chest pain, edema, orthopnea, palpitations, paroxysmal nocturnal dyspnea, positive for shortness of breath or dyspnea on exertion Dermatological: negative for rash Respiratory: negative for cough or wheezing Urologic: negative for hematuria Abdominal: negative for nausea, vomiting, diarrhea, bright red blood per rectum, melena, or hematemesis Neurologic: negative for visual changes, syncope, or dizziness All other systems reviewed and are otherwise negative except as noted above.    Past Medical History  Diagnosis Date  . Abnormal EKG   . Coronary artery disease   . Diabetes mellitus without complication ([place])   . Obesity   . Hyperlipidemia   . Hypertension   . Myocardial infarction ([place]) 2008 and 2012    x 2  . Sleep apnea     could not tolerate cpap mask  . Arthritis     oa  . Cancer [facility])     bladder and prostate  . History of radiation therapy 2012  . History of chemotherapy 2014  . Bladder cancer ([place])   . History of chicken pox     Medications Prior to Admission  Medication Sig Dispense Refill  . apixaban (ELIQUIS) 5 MG TABS tablet Take 5 mg by mouth 2 (two) times daily.    [name] aspirin EC 81 MG tablet Take 81 mg by mouth daily.    [name] atenolol (TENORMIN) 25 MG tablet Take 25 mg by mouth daily.    . canagliflozin (INVOKANA) 300 MG TABS tablet Take 300 mg by mouth daily before breakfast. 30 tablet 3  .  Coenzyme Q10 10 MG capsule Take 10 mg by mouth.    . fentaNYL (DURAGESIC - DOSED MCG/HR) 75 MCG/HR Place 1 patch (75 mcg total) onto the skin every 3 (three) days. 10 patch 0  . Fluticasone Furoate-Vilanterol (BREO ELLIPTA) 100-25 MCG/INH AEPB Inhale 1 Inhaler into the lungs daily.    . furosemide (LASIX) 20 MG tablet Take 20 mg by mouth 2 (two) times daily.    [name] glipiZIDE (GLUCOTROL) 10 MG tablet TAKE 1 TABLET(S) BY MOUTH DAILY 30 tablet 6  . metFORMIN (GLUCOPHAGE-XR) 500 MG 24 hr tablet 2 (TWO) TABLET, ORAL, TWO TIMES DAILY 120 tablet 11  . Multiple Vitamin (MULTIVITAMIN WITH MINERALS) TABS tablet Take 1 tablet by mouth daily.    . naproxen sodium (ALEVE) 220 MG tablet Take 1 tablet by mouth daily as needed.    . pantoprazole (PROTONIX) 40 MG tablet Take 40 mg by mouth 2 (two) times daily.    . potassium chloride (K-DUR) 10 MEQ tablet Take 10 mEq by mouth daily.    . prochlorperazine (COMPAZINE) 10 MG tablet TAKE 1 TABLET BY MOUTH EVERY 6 HOURS AS NEEDED 40 tablet 1  . sotalol (BETAPACE) 80 MG tablet Take by mouth 2 (two) times daily.    . sucralfate (CARAFATE) 1 G tablet Take 1 g by mouth 4 (four) times daily -  with meals and at bedtime.    . ticagrelor (BRILINTA) 90 MG TABS tablet Take by mouth 2 (two) times daily.       [name] aspirin EC  81 mg Oral Daily  . atenolol  25 mg Oral Daily  . canagliflozin  300 mg Oral QAC breakfast  . fentaNYL  75 mcg Transdermal Q72H  . furosemide  20 mg Oral BID  . glipiZIDE  10 mg Oral QAC breakfast  . insulin aspart  0-5 Units Subcutaneous QHS  . insulin aspart  0-9 Units Subcutaneous TID WC  . mometasone-formoterol  2 puff Inhalation BID  . multivitamin with minerals  1 tablet Oral Daily  . pantoprazole  40 mg Oral BID  . potassium chloride  10 mEq Oral Daily  . sodium chloride  10-40 mL Intracatheter Q12H  . sodium chloride  3 mL Intravenous Q12H  . sotalol  80 mg Oral Q12H  . sucralfate  1 g Oral TID WC & HS  . ticagrelor  90 mg Oral BID     Infusions: . sodium chloride 100 mL/hr at 09/26/15 0913    Allergies  Allergen Reactions  . Celebrex [Celecoxib]     Hematuria Other reaction(s): Bleeding Bleeding in bladder  . Effient [Prasugrel] Other (See Comments)    blood clots, blood in urine Other reaction(s): Bleeding Bleeding in bladder    Social History   Social History  . Marital Status: Married    Spouse Name: N/A  . Number of Children: 3  . Years of Education: N/A   Occupational History  . Retired     previously Worked in Research officer, political party at [employer]  . Smoking status: Never Smoker   . Smokeless tobacco: Never Used  . Alcohol Use: 0.0 oz/week    0 Standard drinks or equivalent per week     Comment: occasional  . Drug Use: No  . Sexual Activity: Not on file   Other Topics Concern  . Not on file   Social History Narrative    Family History  Problem Relation Age of Onset  . Stroke Father 81  . Aneurysm Father   . Heart attack Mother   . Lung cancer Other   . Stomach cancer Other     PHYSICAL EXAM: Filed Vitals:   09/26/15 0911  BP: 128/77  Pulse: 78  Temp: 98.5 F (36.9 C)  Resp: 18     Intake/Output Summary (Last 24 hours) at 09/26/15 0929 Last data filed at 09/26/15 0654  Gross per 24 hour  Intake      0 ml  Output   1300 ml  Net  -1300 ml    General:  Well appearing. No respiratory difficulty HEENT: normal Neck: supple. no JVD. Carotids 2+ bilat; no bruits. No lymphadenopathy or thryomegaly appreciated. Cor: PMI nondisplaced. Regular rate & rhythm. No rubs, gallops or murmurs. Lungs: clear Abdomen: soft, nontender, nondistended. No hepatosplenomegaly. No bruits or masses. Good bowel sounds. Extremities: no cyanosis, clubbing, rash, edema Neuro: alert & oriented x 3, cranial nerves grossly intact. moves all 4 extremities w/o difficulty. Affect pleasant.  ECG: NSR 82 BPM NS ST/T changes  Results for orders placed or performed during the hospital  encounter of 09/25/15 (from the past 24 hour(s))  CBC with Differential     Status: Abnormal   Collection Time: 09/25/15 10:25 AM  Result Value Ref Range   WBC 11.7 (H) 3.8 - 10.6 K/uL   RBC 3.03 (L) 4.40 -  5.90 MIL/uL   Hemoglobin 8.7 (L) 13.0 - 18.0 g/dL   HCT 26.3 (L) 40.0 - 52.0 %   MCV 86.5 80.0 - 100.0 fL   MCH 28.6 26.0 - 34.0 pg   MCHC 33.0 32.0 - 36.0 g/dL   RDW 18.1 (H) 11.5 - 14.5 %   Platelets 157 150 - 440 K/uL   Neutrophils Relative % 83 %   Lymphocytes Relative 6 %   Monocytes Relative 7 %   Eosinophils Relative 1 %   Basophils Relative 0 %   Band Neutrophils 2 %   Metamyelocytes Relative 1 %   Myelocytes 0 %   Promyelocytes Absolute 0 %   Blasts 0 %   nRBC 0 0 /100 WBC   Other 0 %   Neutro Abs 10.1 (H) 1.4 - 6.5 K/uL   Lymphs Abs 0.7 (L) 1.0 - 3.6 K/uL   Monocytes Absolute 0.8 0.2 - 1.0 K/uL   Eosinophils Absolute 0.1 0 - 0.7 K/uL   Basophils Absolute 0.0 0 - 0.1 K/uL   Smear Review PLATELETS APPEAR ADEQUATE   Protime-INR     Status: Abnormal   Collection Time: 09/25/15 10:25 AM  Result Value Ref Range   Prothrombin Time 22.0 (H) 11.4 - 15.0 seconds   INR 1.91   APTT     Status: None   Collection Time: 09/25/15 10:25 AM  Result Value Ref Range   aPTT 31 24 - 36 seconds  Type and screen Catherine     Status: None   Collection Time: 09/25/15 11:16 AM  Result Value Ref Range   ABO/RH(D) O NEG    Antibody Screen NEG    Sample Expiration 09/28/2015   Urinalysis complete, with microscopic     Status: Abnormal   Collection Time: 09/25/15 11:16 AM  Result Value Ref Range   Color, Urine RED (A) YELLOW   APPearance CLOUDY (A) CLEAR   Glucose, UA >500 (A) NEGATIVE mg/dL   Bilirubin Urine NEGATIVE NEGATIVE   Ketones, ur 1+ (A) NEGATIVE mg/dL   Specific Gravity, Urine 1.015 1.005 - 1.030   Hgb urine dipstick 3+ (A) NEGATIVE   pH 5.0 5.0 - 8.0   Protein, ur 100 (A) NEGATIVE mg/dL   Nitrite NEGATIVE NEGATIVE   Leukocytes, UA NEGATIVE  NEGATIVE   RBC / HPF TOO NUMEROUS TO COUNT 0 - 5 RBC/hpf   WBC, UA TOO NUMEROUS TO COUNT 0 - 5 WBC/hpf   Bacteria, UA MANY (A) NONE SEEN   Squamous Epithelial / LPF NONE SEEN NONE SEEN   Mucous PRESENT   ABO/Rh     Status: None   Collection Time: 09/25/15 11:17 AM  Result Value Ref Range   ABO/RH(D) O NEG   Hemoglobin     Status: Abnormal   Collection Time: 09/25/15  6:29 PM  Result Value Ref Range   Hemoglobin 8.0 (L) 13.0 - 18.0 g/dL  Glucose, capillary     Status: Abnormal   Collection Time: 09/25/15  9:06 PM  Result Value Ref Range   Glucose-Capillary 255 (H) 65 - 99 mg/dL   Comment 1 Notify RN   Basic metabolic panel     Status: Abnormal   Collection Time: 09/26/15  6:48 AM  Result Value Ref Range   Sodium 130 (L) 135 - 145 mmol/L   Potassium 4.2 3.5 - 5.1 mmol/L   Chloride 102 101 - 111 mmol/L   CO2 20 (L) 22 - 32 mmol/L   Glucose, Bld 205 (  H) 65 - 99 mg/dL   BUN 27 (H) 6 - 20 mg/dL   Creatinine, Ser 1.92 (H) 0.61 - 1.24 mg/dL   Calcium 9.1 8.9 - 10.3 mg/dL   GFR calc non Af Amer 33 (L) >60 mL/min   GFR calc Af Amer 38 (L) >60 mL/min   Anion gap 8 5 - 15  CBC     Status: Abnormal   Collection Time: 09/26/15  6:48 AM  Result Value Ref Range   WBC 10.5 3.8 - 10.6 K/uL   RBC 2.82 (L) 4.40 - 5.90 MIL/uL   Hemoglobin 8.2 (L) 13.0 - 18.0 g/dL   HCT 24.2 (L) 40.0 - 52.0 %   MCV 85.6 80.0 - 100.0 fL   MCH 29.1 26.0 - 34.0 pg   MCHC 34.0 32.0 - 36.0 g/dL   RDW 18.6 (H) 11.5 - 14.5 %   Platelets 128 (L) 150 - 440 K/uL  Glucose, capillary     Status: Abnormal   Collection Time: 09/26/15  7:25 AM  Result Value Ref Range   Glucose-Capillary 187 (H) 65 - 99 mg/dL   Dg Chest 2 View  09/25/2015  CLINICAL DATA:  History of metastatic prostate cancer. The patient was diagnosed with pulmonary embolus 09/21/2015. Increased dyspnea since decreasing anticoagulation therapy. EXAM: CHEST  2 VIEW COMPARISON:  PET CT scan 07/26/2015. FINDINGS: Port-A-Cath is in place. The lungs are  clear. Heart size is normal. No pneumothorax or pleural effusion. IMPRESSION: No acute disease. Electronically Signed   By: Inge Rise M.D.   On: 09/25/2015 11:21     ASSESSMENT: CAD s/p PCI 05/2015 and acute PE   PLAN/DISCUSSION: Advise holding brilinta and starting pt on heparin as he has acute b/l PEs and also had recent evaluation of coronaries by [facility] in office which showed patent LAD stent. Continue ASA 81mg  and will closely follow CBC.   Patient and plan discussed with supervising provider, Dr. Neoma Laming, who agrees with above findings.   Kelby Fam Maki Hege, [place] 09/26/2015 9:29 AM

## 2015-09-26 NOTE — Progress Notes (Addendum)
ANTICOAGULATION CONSULT NOTE - Initial Consult  Pharmacy Consult for Heparin drip Indication: pulmonary embolus  Allergies  Allergen Reactions  . Celebrex [Celecoxib]     Hematuria Other reaction(s): Bleeding Bleeding in bladder  . Effient [Prasugrel] Other (See Comments)    blood clots, blood in urine Other reaction(s): Bleeding Bleeding in bladder    Patient Measurements: Height: 5\' 10"  (177.8 cm) (stated) Weight: 185 lb 1.6 oz (83.961 kg) (admission) IBW/kg (Calculated) : 73 Heparin Dosing Weight: 84 kg  Vital Signs: Temp: 99.7 F (37.6 C) (11/09 1948) Temp Source: Oral (11/09 1948) BP: 118/65 mmHg (11/09 1948) Pulse Rate: 73 (11/09 1948)  Labs:  Recent Labs  09/25/15 1025 09/25/15 1829 09/26/15 0648 09/26/15 1105 09/26/15 2040  HGB 8.7* 8.0* 8.2*  --   --   HCT 26.3*  --  24.2*  --   --   PLT 157  --  128*  --   --   APTT 31  --   --  27 47*  LABPROT 22.0*  --   --  17.6*  --   INR 1.91  --   --  1.44  --   HEPARINUNFRC  --   --   --  2.40*  --   CREATININE  --   --  1.92*  --   --     Estimated Creatinine Clearance: 35.4 mL/min (by C-G formula based on Cr of 1.92).   Medical History: Past Medical History  Diagnosis Date  . Abnormal EKG   . Coronary artery disease   . Diabetes mellitus without complication ([place])   . Obesity   . Hyperlipidemia   . Hypertension   . Myocardial infarction ([place]) 2008 and 2012    x 2  . Sleep apnea     could not tolerate cpap mask  . Arthritis     oa  . Cancer [facility])     bladder and prostate  . History of radiation therapy 2012  . History of chemotherapy 2014  . Bladder cancer ([place])   . History of chicken pox     Medications:  Scheduled:  . aspirin EC  81 mg Oral Daily  . atenolol  25 mg Oral Daily  . canagliflozin  300 mg Oral QAC breakfast  . fentaNYL  75 mcg Transdermal Q72H  . furosemide  20 mg Oral BID  . glipiZIDE  10 mg Oral QAC breakfast  . insulin aspart  0-5 Units Subcutaneous QHS  . insulin  aspart  0-9 Units Subcutaneous TID WC  . mometasone-formoterol  2 puff Inhalation BID  . multivitamin with minerals  1 tablet Oral Daily  . pantoprazole  40 mg Oral BID  . potassium chloride  10 mEq Oral Daily  . sodium chloride  10-40 mL Intracatheter Q12H  . sodium chloride  3 mL Intravenous Q12H  . sotalol  80 mg Oral Q12H  . sucralfate  1 g Oral TID WC & HS    Assessment: Patient is a 74 yo male admitted for PE and hematuria.  Patient on Eliquis 5 mg po BID as outpatient for treatment.   MD desires therapy with heparin drip in setting of urinary bleeding.  IVC filter placed on 11/8.  Last Eliquis dose was 11/8 AM.   Baseline labs: HL -  2.4, aPTT: 27, INR: 1.44  Goal of Therapy:  APTT goal: 68-109 seconds Heparin level 0.3-0.7 units/ml Monitor platelets by anticoagulation protocol: Yes   Plan:  Give 4000 units bolus x 1  per MD.  Initiate heparin drip at rate of 1300 units/hr.  HL elevated as expected due to apixaban dose on 11/8 in AM.  Will obtain aPTT levels for heparin drip adjustments.  APTT ordered for 8 hours after heparin drip start at 2030.  CBC and HL ordered with AM labs.   11/09:  APTT @ 20:30 = 47 Will increase heparin drip to 1400 units/hr.  Will recheck aPTT 6 hrs after rate change on   Javonte Elenes D 09/26/2015,9:53 PM

## 2015-09-26 NOTE — Progress Notes (Addendum)
[place] [facility] [place] NAME: Louis Alexander    MR#:  [id number]  DATE OF BIRTH:  1941/11/06  SUBJECTIVE:  CHIEF COMPLAINT:   Chief Complaint  Patient presents with  . Shortness of Breath   hematuria is better, no medicine no bloody stool. No hematemesis.  REVIEW OF SYSTEMS:  CONSTITUTIONAL: No fever, has generalized weakness.  EYES: No blurred or double vision.  EARS, NOSE, AND THROAT: No tinnitus or ear pain.  RESPIRATORY: No cough, shortness of breath, wheezing or hemoptysis.  CARDIOVASCULAR: No chest pain, orthopnea, edema.  GASTROINTESTINAL: No nausea, vomiting, diarrhea or abdominal pain.  GENITOURINARY: No dysuria, has hematuria.  ENDOCRINE: No polyuria, nocturia,  HEMATOLOGY: No anemia, easy bruising or bleeding SKIN: No rash or lesion. MUSCULOSKELETAL: No joint pain or arthritis.   NEUROLOGIC: No tingling, numbness, weakness.  PSYCHIATRY: No anxiety or depression.   DRUG ALLERGIES:   Allergies  Allergen Reactions  . Celebrex [Celecoxib]     Hematuria Other reaction(s): Bleeding Bleeding in bladder  . Effient [Prasugrel] Other (See Comments)    blood clots, blood in urine Other reaction(s): Bleeding Bleeding in bladder    VITALS:  Blood pressure 129/70, pulse 72, temperature 98.6 F (37 C), temperature source Oral, resp. rate 19, height 5\' 10"  (1.778 m), weight 83.961 kg (185 lb 1.6 oz), SpO2 97 %.  PHYSICAL EXAMINATION:  GENERAL:  74 y.o.-year-old patient lying in the bed with no acute distress.  EYES: Pupils equal, round, reactive to light and accommodation. No scleral icterus. Extraocular muscles intact.  HEENT: Head atraumatic, normocephalic. Oropharynx and nasopharynx clear.  NECK:  Supple, no jugular venous distention. No thyroid enlargement, no tenderness.  LUNGS: Normal breath sounds bilaterally, no wheezing, rales,rhonchi or crepitation. No use of accessory muscles of respiration.  CARDIOVASCULAR:  S1, S2 normal. No murmurs, rubs, or gallops.  ABDOMEN: Soft, nontender, nondistended. Bowel sounds present. No organomegaly or mass. Urostomy bag. EXTREMITIES: No pedal edema, cyanosis, or clubbing.  NEUROLOGIC: Cranial nerves II through XII are intact. Muscle strength 5/5 in all extremities. Sensation intact. Gait not checked.  PSYCHIATRIC: The patient is alert and oriented x 3.  SKIN: No obvious rash, lesion, or ulcer.    LABORATORY PANEL:   CBC  Recent Labs Lab 09/26/15 0648  WBC 10.5  HGB 8.2*  HCT 24.2*  PLT 128*   ------------------------------------------------------------------------------------------------------------------  Chemistries   Recent Labs Lab 09/26/15 0648  NA 130*  K 4.2  CL 102  CO2 20*  GLUCOSE 205*  BUN 27*  CREATININE 1.92*  CALCIUM 9.1   ------------------------------------------------------------------------------------------------------------------  Cardiac Enzymes No results for input(s): TROPONINI in the last 168 hours. ------------------------------------------------------------------------------------------------------------------  RADIOLOGY:  Dg Chest 2 View  09/25/2015  CLINICAL DATA:  History of metastatic prostate cancer. The patient was diagnosed with pulmonary embolus 09/21/2015. Increased dyspnea since decreasing anticoagulation therapy. EXAM: CHEST  2 VIEW COMPARISON:  PET CT scan 07/26/2015. FINDINGS: Port-A-Cath is in place. The lungs are clear. Heart size is normal. No pneumothorax or pleural effusion. IMPRESSION: No acute disease. Electronically Signed   By: Inge Rise M.D.   On: 09/25/2015 11:21    EKG:   Orders placed or performed during the hospital encounter of 09/25/15  . EKG 12-Lead  . EKG 12-Lead  . ED EKG  . ED EKG    ASSESSMENT AND PLAN:   1. Pulmonary emboli in the right main pulmonary artery and segmental branches on the left.  S/p IVC filter today. Started heparin this  morning, continue aspirin  and discontinue Brilinta per Dr. Humphrey Rolls.   2. Hematuria: Better. Discontinued at increased and Brilinta,  follow-up urology as outpatient in 2 weeks after discharge.  3. Guaiac positive stool: No active bleeding, follow-up hemoglobin. Continue pantoprazole 40 mg by mouth twice a day  4. History of metastatic prostate cancer: Follow-up of [name] as outpatient.   5. Acute on chronic anemia with blood loss: Follow-up hemoglobin.   6. Type 2 diabetes without complication: on sliding scale insulin, glipizide and ADA diet.  7. CAD with previous stents: Continue aspirin, simvastatin and sotalol,  discontinue Brilinta.  * Acute renal failure due to dehydration. Start normal saline IV and follow-up BMP. *Hyponatremia. Normal saline IV and follow-up BMP. *Thrombocytopenia. Follow-up CBC.  All the records are reviewed and case discussed with Care Management/Social Workerr. Management plans discussed with the patient, family and they are in agreement. Greater than 50% time was spent on coordination of care and face-to-face counseling.  CODE STATUS: Full code  TOTAL TIME TAKING CARE OF THIS PATIENT: 43 minutes.   POSSIBLE D/C IN 2-3 DAYS, DEPENDING ON CLINICAL CONDITION.   Demetrios Loll M.D on 09/26/2015 at 2:52 PM  Between 7am to 6pm - Pager - [phone]  After 6pm go to [email or web address] - password [email or web address]  Office  [phone]  CC: Primary care physician; Lelon Huh, MD

## 2015-09-26 NOTE — Progress Notes (Signed)
Patient PAD was removed as per order , no bleeding noted , will continue to monitor

## 2015-09-26 NOTE — Progress Notes (Signed)
Patient currently sitting in chair. No complaints of pain. Patient changed urostomy bag.

## 2015-09-26 NOTE — Consult Note (Signed)
[place]  [phone]) [phone] Fax:(336) [phone]  ID: Louis Alexander OB: February 03, 1941  MR#: [id number]  UVO#:[id number]  Patient Care Team: Birdie Sons, MD as PCP - General (Family Medicine) Murrell Redden, MD (Urology) Dionisio David, MD as Consulting Physician (Cardiology) Lloyd Huger, MD as Consulting Physician (Oncology)  CHIEF COMPLAINT:  Chief Complaint  Patient presents with  . Shortness of Breath    INTERVAL HISTORY: Patient is a 74 year old male undergoing chemotherapy for stage IV urothelial carcinoma. He last received chemotherapy with cisplatin and gemcitabine on September 18, 2015. Patient states that 1-2 days after his chemotherapy he started feeling increasing shortness of breath and went to a pre-scheduled appointment with his cardiologist. CT scan ordered by his cardiologist revealed pulmonary embolism and patient was placed on Eliquis. Patient states his shortness of breath gradually improved, but noticed increase hematuria in his urostomy bag. He subsequently went to the emergency room and was found to have a declining hemoglobin. Eliquis has been discontinued and patient is now on heparin. IVC filter was placed yesterday. He offers no further specific complaints.  REVIEW OF SYSTEMS:   Review of Systems  Constitutional: Positive for malaise/fatigue. Negative for fever.  Respiratory: Positive for shortness of breath.   Cardiovascular: Negative.  Negative for chest pain.  Gastrointestinal: Positive for blood in stool.  Musculoskeletal: Negative.   Neurological: Positive for weakness.  Endo/Heme/Allergies: Bruises/bleeds easily.    As per HPI. Otherwise, a complete review of systems is negatve.  PAST MEDICAL HISTORY: Past Medical History  Diagnosis Date  . Abnormal EKG   . Coronary artery disease   . Diabetes mellitus without complication ([place])   . Obesity   . Hyperlipidemia   . Hypertension   . Myocardial infarction ([place])  2008 and 2012    x 2  . Sleep apnea     could not tolerate cpap mask  . Arthritis     oa  . Cancer [facility])     bladder and prostate  . History of radiation therapy 2012  . History of chemotherapy 2014  . Bladder cancer ([place])   . History of chicken pox     PAST SURGICAL HISTORY: Past Surgical History  Procedure Laterality Date  . Cardiac catheterization  12-06-2003  . Lad stent  2000 x1 stent, 2008 x 1 stent, 2012 x 1 stent  . Hernia repair  yrs ago  . Appendectomy  many yrs ago  . Rotator cuff repair Left yrs ago  . Seed implant to prostate with radiation  2012  . Protatectomy and urostomy  2014  . Total hip arthroplasty Right 12/27/2013    Procedure: RIGHT TOTAL HIP ARTHROPLASTY ANTERIOR APPROACH;  Surgeon: Mauri Pole, MD;  Location: [facility];  Service: Orthopedics;  Laterality: Right;  . Cardiac catheterization N/A 06/04/2015    Procedure: Left Heart Cath and Coronary Angiography;  Surgeon: Corey Skains, MD;  Location: [place] [facility];  Service: Cardiovascular;  Laterality: N/A;  . Cardiac catheterization N/A 06/04/2015    Procedure: Coronary Stent Intervention;  Surgeon: Wellington Hampshire, MD;  Location: [place] [facility];  Service: Cardiovascular;  Laterality: N/A;    FAMILY HISTORY Family History  Problem Relation Age of Onset  . Stroke Father 40  . Aneurysm Father   . Heart attack Mother   . Lung cancer Other   . Stomach cancer Other        ADVANCED DIRECTIVES:    HEALTH MAINTENANCE: Social History  Substance Use Topics  . Smoking status: Never Smoker   . Smokeless tobacco: Never Used  . Alcohol Use: 0.0 oz/week    0 Standard drinks or equivalent per week     Comment: occasional     Colonoscopy:  PAP:  Bone density:  Lipid panel:  Allergies  Allergen Reactions  . Celebrex [Celecoxib]     Hematuria Other reaction(s): Bleeding Bleeding in bladder  . Effient [Prasugrel] Other (See Comments)    blood clots, blood in urine Other  reaction(s): Bleeding Bleeding in bladder    Current Facility-Administered Medications  Medication Dose Route Frequency Provider Last Rate Last Dose  . 0.9 %  sodium chloride infusion   Intravenous Continuous Demetrios Loll, MD 100 mL/hr at 09/26/15 0913    . acetaminophen (TYLENOL) tablet 650 mg  650 mg Oral Q6H PRN Bettey Costa, MD       Or  . acetaminophen (TYLENOL) suppository 650 mg  650 mg Rectal Q6H PRN Bettey Costa, MD      . alum & mag hydroxide-simeth (MAALOX/MYLANTA) 200-200-20 MG/5ML suspension 30 mL  30 mL Oral Q6H PRN Bettey Costa, MD      . aspirin EC tablet 81 mg  81 mg Oral Daily Bettey Costa, MD   81 mg at 09/26/15 0915  . atenolol (TENORMIN) tablet 25 mg  25 mg Oral Daily Bettey Costa, MD   25 mg at 09/26/15 0915  . canagliflozin (INVOKANA) tablet 300 mg  300 mg Oral QAC breakfast Bettey Costa, MD   300 mg at 09/26/15 0916  . fentaNYL (DURAGESIC - dosed mcg/hr) patch 75 mcg  75 mcg Transdermal Q72H Bettey Costa, MD   75 mcg at 09/26/15 0920  . furosemide (LASIX) tablet 20 mg  20 mg Oral BID Bettey Costa, MD   20 mg at 09/26/15 0917  . glipiZIDE (GLUCOTROL) tablet 10 mg  10 mg Oral QAC breakfast Bettey Costa, MD   10 mg at 09/26/15 0917  . heparin ADULT infusion 100 units/mL (25000 units/250 mL)  1,300 Units/hr Intravenous Continuous Dionisio David, MD 13 mL/hr at 09/26/15 1223 1,300 Units/hr at 09/26/15 1223  . HYDROcodone-acetaminophen (NORCO/VICODIN) 5-325 MG per tablet 1-2 tablet  1-2 tablet Oral Q4H PRN Bettey Costa, MD      . insulin aspart (novoLOG) injection 0-5 Units  0-5 Units Subcutaneous QHS Bettey Costa, MD   3 Units at 09/25/15 2223  . insulin aspart (novoLOG) injection 0-9 Units  0-9 Units Subcutaneous TID WC Bettey Costa, MD   5 Units at 09/26/15 1210  . mometasone-formoterol (DULERA) 100-5 MCG/ACT inhaler 2 puff  2 puff Inhalation BID Bettey Costa, MD   2 puff at 09/26/15 0915  . multivitamin with minerals tablet 1 tablet  1 tablet Oral Daily Bettey Costa, MD   1 tablet at 09/26/15 0915  .  ondansetron (ZOFRAN) tablet 4 mg  4 mg Oral Q6H PRN Bettey Costa, MD       Or  . ondansetron (ZOFRAN) injection 4 mg  4 mg Intravenous Q6H PRN Sital Mody, MD      . pantoprazole (PROTONIX) EC tablet 40 mg  40 mg Oral BID Bettey Costa, MD   40 mg at 09/26/15 0937  . potassium chloride (K-DUR) CR tablet 10 mEq  10 mEq Oral Daily Bettey Costa, MD   10 mEq at 09/26/15 0916  . senna-docusate (Senokot-S) tablet 1 tablet  1 tablet Oral QHS PRN Bettey Costa, MD      . sodium chloride 0.9 % injection  10-40 mL  10-40 mL Intracatheter Q12H Demetrios Loll, MD   10 mL at 09/26/15 0930  . sodium chloride 0.9 % injection 10-40 mL  10-40 mL Intracatheter PRN Demetrios Loll, MD   20 mL at 09/26/15 3354  . sodium chloride 0.9 % injection 3 mL  3 mL Intravenous Q12H Bettey Costa, MD   3 mL at 09/26/15 0917  . sotalol (BETAPACE) tablet 80 mg  80 mg Oral Q12H Bettey Costa, MD   80 mg at 09/26/15 0916  . sucralfate (CARAFATE) tablet 1 g  1 g Oral TID WC & HS Bettey Costa, MD   1 g at 09/26/15 1211   Facility-Administered Medications Ordered in Other Encounters  Medication Dose Route Frequency Provider Last Rate Last Dose  . chlorhexidine (HIBICLENS) 4 % liquid 4 application  60 mL Topical Once Danae Orleans, PA-C       And  . chlorhexidine (HIBICLENS) 4 % liquid 4 application  60 mL Topical Once Danae Orleans, PA-C      . sodium chloride 0.9 % injection 10 mL  10 mL Intracatheter PRN Lloyd Huger, MD   10 mL at 05/09/15 0900  . sodium chloride 0.9 % injection 3 mL  3 mL Intravenous PRN Lloyd Huger, MD        OBJECTIVE: Filed Vitals:   09/26/15 1101  BP: 129/70  Pulse: 72  Temp: 98.6 F (37 C)  Resp: 19     Body mass index is 26.56 kg/(m^2).    ECOG FS:0 - Asymptomatic  General: Well-developed, well-nourished, no acute distress. Eyes: Pink conjunctiva, anicteric sclera. HEENT: Normocephalic, moist mucous membranes, clear oropharnyx. Lungs: Clear to auscultation bilaterally. Heart: Regular rate and rhythm. No  rubs, murmurs, or gallops. Abdomen: Soft, nontender, nondistended. No organomegaly noted, normoactive bowel sounds. Musculoskeletal: No edema, cyanosis, or clubbing. Neuro: Alert, answering all questions appropriately. Cranial nerves grossly intact. Skin: No rashes or petechiae noted. Psych: Normal affect. Lymphatics: No cervical, calvicular, axillary or inguinal LAD.   LAB RESULTS:  Lab Results  Component Value Date   NA 130* 09/26/2015   K 4.2 09/26/2015   CL 102 09/26/2015   CO2 20* 09/26/2015   GLUCOSE 205* 09/26/2015   BUN 27* 09/26/2015   CREATININE 1.92* 09/26/2015   CALCIUM 9.1 09/26/2015   PROT 6.6 09/18/2015   ALBUMIN 3.1* 09/18/2015   AST 20 09/18/2015   ALT 17 09/18/2015   ALKPHOS 82 09/18/2015   BILITOT 0.4 09/18/2015   GFRNONAA 33* 09/26/2015   GFRAA 38* 09/26/2015    Lab Results  Component Value Date   WBC 10.5 09/26/2015   NEUTROABS 10.1* 09/25/2015   HGB 8.2* 09/26/2015   HCT 24.2* 09/26/2015   MCV 85.6 09/26/2015   PLT 128* 09/26/2015     STUDIES: Dg Chest 2 View  09/25/2015  CLINICAL DATA:  History of metastatic prostate cancer. The patient was diagnosed with pulmonary embolus 09/21/2015. Increased dyspnea since decreasing anticoagulation therapy. EXAM: CHEST  2 VIEW COMPARISON:  PET CT scan 07/26/2015. FINDINGS: Port-A-Cath is in place. The lungs are clear. Heart size is normal. No pneumothorax or pleural effusion. IMPRESSION: No acute disease. Electronically Signed   By: Inge Rise M.D.   On: 09/25/2015 11:21    ASSESSMENT: Stage IV urothelial carcinoma, PEs with bleeding.  PLAN:    1. Urothelial carcinoma: Patient received cycle 3, day 1 of cisplatin and gemcitabine on September 18, 2015. He missed his appointment earlier this week secondary to his wife fracturing her hip.  He has been instructed to keep his previously scheduled appointment for his next infusion of chemotherapy on October 02, 2015.   2. Pain: Nearly resolved. Secondary  to malignancy. Patient previously had XRT to his pelvic area. Continue current narcotic regimen as prescribed. 3. Hyperglycemia: Patient is diabetic. Continue glipizide and metformin as prescribed. Monitor. 4. Pulmonary embolism: Agree with discontinuing anticoagulation and IVC filter placement. Appreciate vascular surgery input. 5. Anemia: Multifactorial including chemotherapy as well as recent bleed. Continue to monitor hemoglobin and transfuse if it falls below 7.0.  Present consult, call with questions.   Urothelial cancer [facility]   Staging form: Kidney, AJCC 7th Edition     Clinical stage from 03/16/2015: Stage IV (TX, N1, M1) - Signed by Lloyd Huger, MD on 03/16/2015   Lloyd Huger, MD   09/26/2015 2:25 PM

## 2015-09-26 NOTE — Progress Notes (Signed)
ANTICOAGULATION CONSULT NOTE - Initial Consult  Pharmacy Consult for Heparin drip Indication: pulmonary embolus  Allergies  Allergen Reactions  . Celebrex [Celecoxib]     Hematuria Other reaction(s): Bleeding Bleeding in bladder  . Effient [Prasugrel] Other (See Comments)    blood clots, blood in urine Other reaction(s): Bleeding Bleeding in bladder    Patient Measurements: Height: 5\' 10"  (177.8 cm) (stated) Weight: 185 lb 1.6 oz (83.961 kg) (admission) IBW/kg (Calculated) : 73 Heparin Dosing Weight: 84 kg  Vital Signs: Temp: 98.6 F (37 C) (11/09 1101) Temp Source: Oral (11/09 1101) BP: 129/70 mmHg (11/09 1101) Pulse Rate: 72 (11/09 1101)  Labs:  Recent Labs  09/25/15 1025 09/25/15 1829 09/26/15 0648 09/26/15 1105  HGB 8.7* 8.0* 8.2*  --   HCT 26.3*  --  24.2*  --   PLT 157  --  128*  --   APTT 31  --   --  27  LABPROT 22.0*  --   --  17.6*  INR 1.91  --   --  1.44  HEPARINUNFRC  --   --   --  2.40*  CREATININE  --   --  1.92*  --     Estimated Creatinine Clearance: 35.4 mL/min (by C-G formula based on Cr of 1.92).   Medical History: Past Medical History  Diagnosis Date  . Abnormal EKG   . Coronary artery disease   . Diabetes mellitus without complication ([place])   . Obesity   . Hyperlipidemia   . Hypertension   . Myocardial infarction ([place]) 2008 and 2012    x 2  . Sleep apnea     could not tolerate cpap mask  . Arthritis     oa  . Cancer [facility])     bladder and prostate  . History of radiation therapy 2012  . History of chemotherapy 2014  . Bladder cancer ([place])   . History of chicken pox     Medications:  Scheduled:  . aspirin EC  81 mg Oral Daily  . atenolol  25 mg Oral Daily  . canagliflozin  300 mg Oral QAC breakfast  . fentaNYL  75 mcg Transdermal Q72H  . furosemide  20 mg Oral BID  . glipiZIDE  10 mg Oral QAC breakfast  . insulin aspart  0-5 Units Subcutaneous QHS  . insulin aspart  0-9 Units Subcutaneous TID WC  .  mometasone-formoterol  2 puff Inhalation BID  . multivitamin with minerals  1 tablet Oral Daily  . pantoprazole  40 mg Oral BID  . potassium chloride  10 mEq Oral Daily  . sodium chloride  10-40 mL Intracatheter Q12H  . sodium chloride  3 mL Intravenous Q12H  . sotalol  80 mg Oral Q12H  . sucralfate  1 g Oral TID WC & HS    Assessment: Patient is a 74 yo male admitted for PE and hematuria.  Patient on Eliquis 5 mg po BID as outpatient for treatment.   MD desires therapy with heparin drip in setting of urinary bleeding.  IVC filter placed on 11/8.  Last Eliquis dose was 11/8 AM.   Baseline labs: HL -  2.4, aPTT: 27, INR: 1.44  Goal of Therapy:  APTT goal: 68-109 seconds Heparin level 0.3-0.7 units/ml Monitor platelets by anticoagulation protocol: Yes   Plan:  Give 4000 units bolus x 1 per MD.  Initiate heparin drip at rate of 1300 units/hr.  HL elevated as expected due to apixaban dose on 11/8 in AM.  Will obtain aPTT levels for heparin drip adjustments.  APTT ordered for 8 hours after heparin drip start at 2030.  CBC and HL ordered with AM labs.   Pharmacy will continue to follow.  Murrell Converse, PharmD Clinical Pharmacist 09/26/2015   Llewelyn Sheaffer G 09/26/2015,2:11 PM

## 2015-09-26 NOTE — Care Management (Addendum)
Informed that patient's wife has been admitted to 1A.  This patient was diagnosed with  pulmonary embolus prior to this admission and placed on Eliquis.  He then developed hematuria and dose to be decreased to 5mg  bid rather than 10mg .  He is currently undergoing chemo for urinary cancer.  Had IVC filter  placed on 11/8.  Currently on bedrest   H/P indicates patient was to have been on heparin drip but there is no order.  Found out that the decision to be placed on heparin was deferred to cardiology who has since seen and ordered heparin drip.  Brilinta is discontinued. Patient wife is currently inpatient on 1A with femur fracture.  Will work with CM on 1A to coordinate discharge plans

## 2015-09-26 NOTE — Progress Notes (Signed)
[facility]  Daily Progress Note   Subjective  - 1 Day Post-Op  Patient denies new onset shortness of breath no new pleuritic chest pain. He states he did ambulate short distances with the assistance of a walker earlier today. His initial dressing in the right groin has been changed. No groin-related complaints. Urine in the gravity bag is significantly improved with a marked decrease in his hematuria  Objective Filed Vitals:   09/26/15 0446 09/26/15 0911 09/26/15 1101 09/26/15 1948  BP: 114/71 128/77 129/70 118/65  Pulse: 72 78 72 73  Temp: 98.6 F (37 C) 98.5 F (36.9 C) 98.6 F (37 C) 99.7 F (37.6 C)  TempSrc: Oral Oral Oral Oral  Resp: 18 18 19 21   Height:      Weight:      SpO2: 98% 97%  100%    Intake/Output Summary (Last 24 hours) at 09/26/15 2212 Last data filed at 09/26/15 1900  Gross per 24 hour  Intake 1584.35 ml  Output   2350 ml  Net -765.65 ml    PULM  Normal effort , no use of accessory muscles CV  No JVD, RRR Abd      No distended, nontender VASC  right groin is evaluated is clean dry and intact no evidence of hematoma bandage is present  Laboratory CBC    Component Value Date/Time   WBC 10.5 09/26/2015 0648   WBC 7.2 03/15/2015 0930   HGB 8.2* 09/26/2015 0648   HGB 11.9* 03/15/2015 0930   HCT 24.2* 09/26/2015 0648   HCT 36.1* 03/15/2015 0930   PLT 128* 09/26/2015 0648   PLT 502* 03/15/2015 0930    BMET    Component Value Date/Time   NA 130* 09/26/2015 0648   NA 133* 03/15/2015 0930   NA 139 12/22/2014   K 4.2 09/26/2015 0648   K 4.5 03/15/2015 0930   CL 102 09/26/2015 0648   CL 100* 03/15/2015 0930   CO2 20* 09/26/2015 0648   CO2 26 03/15/2015 0930   GLUCOSE 205* 09/26/2015 0648   GLUCOSE 179* 03/15/2015 0930   BUN 27* 09/26/2015 0648   BUN 21* 03/15/2015 0930   BUN 21 12/22/2014   CREATININE 1.92* 09/26/2015 0648   CREATININE 0.96 03/15/2015 0930   CREATININE 1.0 12/22/2014   CALCIUM 9.1 09/26/2015 0648    CALCIUM 8.7* 03/15/2015 0930   GFRNONAA 33* 09/26/2015 0648   GFRNONAA >60 03/15/2015 0930   GFRAA 38* 09/26/2015 0648   GFRAA >60 03/15/2015 0930    Assessment/Planning: POD #1 s/p IVC filter placement   Patient is doing well office Eliquis he can resume his usual activities ambulation is without restriction he will follow up with me in the office after discharge at which time graduated compression will be discussed    Katha Cabal  09/26/2015, 10:12 PM

## 2015-09-27 ENCOUNTER — Encounter: Payer: Self-pay | Admitting: Family Medicine

## 2015-09-27 LAB — BASIC METABOLIC PANEL
Anion gap: 8 (ref 5–15)
BUN: 33 mg/dL — AB (ref 6–20)
CO2: 20 mmol/L — ABNORMAL LOW (ref 22–32)
CREATININE: 2.03 mg/dL — AB (ref 0.61–1.24)
Calcium: 8.8 mg/dL — ABNORMAL LOW (ref 8.9–10.3)
Chloride: 105 mmol/L (ref 101–111)
GFR calc Af Amer: 36 mL/min — ABNORMAL LOW (ref >60)
GFR, EST NON AFRICAN AMERICAN: 31 mL/min — AB (ref >60)
GLUCOSE: 156 mg/dL — AB (ref 65–99)
POTASSIUM: 4.4 mmol/L (ref 3.5–5.1)
Sodium: 133 mmol/L — ABNORMAL LOW (ref 135–145)

## 2015-09-27 LAB — HEPARIN LEVEL (UNFRACTIONATED): Heparin Unfractionated: 1.22 IU/mL — ABNORMAL HIGH (ref 0.30–0.70)

## 2015-09-27 LAB — PREPARE RBC (CROSSMATCH)

## 2015-09-27 LAB — APTT: aPTT: 60 seconds — ABNORMAL HIGH (ref 24–36)

## 2015-09-27 LAB — CBC
HCT: 24.1 % — ABNORMAL LOW (ref 40.0–52.0)
Hemoglobin: 8 g/dL — ABNORMAL LOW (ref 13.0–18.0)
MCH: 28.6 pg (ref 26.0–34.0)
MCHC: 33 g/dL (ref 32.0–36.0)
MCV: 86.5 fL (ref 80.0–100.0)
PLATELETS: 109 10*3/uL — AB (ref 150–440)
RBC: 2.78 MIL/uL — AB (ref 4.40–5.90)
RDW: 18.8 % — ABNORMAL HIGH (ref 11.5–14.5)
WBC: 9.9 10*3/uL (ref 3.8–10.6)

## 2015-09-27 LAB — GLUCOSE, CAPILLARY
GLUCOSE-CAPILLARY: 210 mg/dL — AB (ref 65–99)
Glucose-Capillary: 165 mg/dL — ABNORMAL HIGH (ref 65–99)
Glucose-Capillary: 156 mg/dL — ABNORMAL HIGH (ref 65–99)

## 2015-09-27 MED ORDER — SODIUM CHLORIDE 0.9 % IV SOLN
Freq: Once | INTRAVENOUS | Status: AC
  Administered 2015-09-27: 18:00:00 via INTRAVENOUS

## 2015-09-27 MED ORDER — CHLORPROMAZINE HCL 10 MG PO TABS
10.0000 mg | ORAL_TABLET | Freq: Three times a day (TID) | ORAL | Status: DC | PRN
  Administered 2015-09-27 – 2015-09-30 (×6): 10 mg via ORAL
  Filled 2015-09-27 (×7): qty 1

## 2015-09-27 MED ORDER — SODIUM CHLORIDE 0.9 % IV SOLN
Freq: Once | INTRAVENOUS | Status: AC
  Administered 2015-09-27: 12:00:00 via INTRAVENOUS

## 2015-09-27 MED ORDER — APIXABAN 5 MG PO TABS
5.0000 mg | ORAL_TABLET | Freq: Two times a day (BID) | ORAL | Status: DC
  Administered 2015-09-27 – 2015-09-30 (×6): 5 mg via ORAL
  Filled 2015-09-27 (×6): qty 1

## 2015-09-27 MED ORDER — CLOPIDOGREL BISULFATE 75 MG PO TABS
75.0000 mg | ORAL_TABLET | Freq: Every day | ORAL | Status: DC
  Administered 2015-09-27 – 2015-09-30 (×4): 75 mg via ORAL
  Filled 2015-09-27 (×4): qty 1

## 2015-09-27 MED ORDER — SODIUM CHLORIDE 0.9 % IV SOLN
INTRAVENOUS | Status: AC
  Administered 2015-09-27: 09:00:00 via INTRAVENOUS

## 2015-09-27 MED ORDER — FUROSEMIDE 10 MG/ML IJ SOLN
20.0000 mg | Freq: Once | INTRAMUSCULAR | Status: AC
  Administered 2015-09-27: 20 mg via INTRAVENOUS
  Filled 2015-09-27: qty 2

## 2015-09-27 MED ORDER — SOTALOL HCL 80 MG PO TABS
80.0000 mg | ORAL_TABLET | Freq: Every day | ORAL | Status: DC
  Administered 2015-09-28: 80 mg via ORAL
  Administered 2015-09-29: 40 mg via ORAL
  Administered 2015-09-30: 80 mg via ORAL
  Filled 2015-09-27 (×3): qty 1

## 2015-09-27 NOTE — Progress Notes (Signed)
Patient c/o hiccups. States one of his home medication causes them, but is unsure of which one. Does not take medication to reduce symptoms at home, but has requested for some here. [facility] notified. Order for Thorazine obtained. Will educate patient about possible adverse side effects. Will continue to monitor.

## 2015-09-27 NOTE — Progress Notes (Signed)
Blood bank asking about having units ready ahead of time for more blood transufsions and needed to know from the MD. Dr. Bridgett Larsson stated patient does not need transfusions ahead. MD also stated to run 2 units of blood instead of 1. Okay to stop fluids while blood runs through port. Does not need fluids and blood at the same time. Will start first unit shortly.

## 2015-09-27 NOTE — Care Management (Addendum)
IVC filter placed.  Discussed the need to ambulate patient  and obtain exertional room air sats. Reported that patient has "been up to the bathroom."  Obtained order for PT consult from attending.  Heparin drip has been discontinued. Patient on Plavix rather than Eliquis and Brillinta.  Patient would prefer to discharge home.  he is in agreement with home health services such as nurse and physical therapy.  He says he will not be alone- that his daughter will stay with him.  No agency preference for home health.  Provided patient with list of home health agencies

## 2015-09-27 NOTE — Progress Notes (Signed)
ANTICOAGULATION CONSULT NOTE - Initial Consult  Pharmacy Consult for Heparin drip Indication: pulmonary embolus  Allergies  Allergen Reactions  . Celebrex [Celecoxib]     Hematuria Other reaction(s): Bleeding Bleeding in bladder  . Effient [Prasugrel] Other (See Comments)    blood clots, blood in urine Other reaction(s): Bleeding Bleeding in bladder    Patient Measurements: Height: 5\' 10"  (177.8 cm) (stated) Weight: 185 lb 1.6 oz (83.961 kg) (admission) IBW/kg (Calculated) : 73 Heparin Dosing Weight: 84 kg  Vital Signs: Temp: 98.5 F (36.9 C) (11/10 0534) Temp Source: Oral (11/10 0534) BP: 113/69 mmHg (11/10 0534) Pulse Rate: 64 (11/10 0534)  Labs:  Recent Labs  09/25/15 1025  09/26/15 0648 09/26/15 1105 09/26/15 2040 09/27/15 0454  HGB 8.7*  < > 8.2*  --   --  8.0*  HCT 26.3*  --  24.2*  --   --  24.1*  PLT 157  --  128*  --   --  109*  APTT 31  --   --  27 47* 60*  LABPROT 22.0*  --   --  17.6*  --   --   INR 1.91  --   --  1.44  --   --   HEPARINUNFRC  --   --   --  2.40*  --   --   CREATININE  --   --  1.92*  --   --  2.03*  < > = values in this interval not displayed.  Estimated Creatinine Clearance: 33.5 mL/min (by C-G formula based on Cr of 2.03).   Medical History: Past Medical History  Diagnosis Date  . Abnormal EKG   . Coronary artery disease   . Diabetes mellitus without complication ([place])   . Obesity   . Hyperlipidemia   . Hypertension   . Myocardial infarction ([place]) 2008 and 2012    x 2  . Sleep apnea     could not tolerate cpap mask  . Arthritis     oa  . Cancer [facility])     bladder and prostate  . History of radiation therapy 2012  . History of chemotherapy 2014  . Bladder cancer ([place])   . History of chicken pox     Medications:  Scheduled:  . aspirin EC  81 mg Oral Daily  . atenolol  25 mg Oral Daily  . canagliflozin  300 mg Oral QAC breakfast  . fentaNYL  75 mcg Transdermal Q72H  . furosemide  20 mg Oral BID  . glipiZIDE   10 mg Oral QAC breakfast  . insulin aspart  0-5 Units Subcutaneous QHS  . insulin aspart  0-9 Units Subcutaneous TID WC  . mometasone-formoterol  2 puff Inhalation BID  . multivitamin with minerals  1 tablet Oral Daily  . pantoprazole  40 mg Oral BID  . potassium chloride  10 mEq Oral Daily  . sodium chloride  10-40 mL Intracatheter Q12H  . sodium chloride  3 mL Intravenous Q12H  . sotalol  80 mg Oral Q12H  . sucralfate  1 g Oral TID WC & HS    Assessment: Patient is a 74 yo male admitted for PE and hematuria.  Patient on Eliquis 5 mg po BID as outpatient for treatment.   MD desires therapy with heparin drip in setting of urinary bleeding.  IVC filter placed on 11/8.  Last Eliquis dose was 11/8 AM.   Baseline labs: HL -  2.4, aPTT: 27, INR: 1.44  Goal of Therapy:  APTT goal: 68-109 seconds Heparin level 0.3-0.7 units/ml Monitor platelets by anticoagulation protocol: Yes   Plan:  Give 4000 units bolus x 1 per MD.  Initiate heparin drip at rate of 1300 units/hr.  HL elevated as expected due to apixaban dose on 11/8 in AM.  Will obtain aPTT levels for heparin drip adjustments.  APTT ordered for 8 hours after heparin drip start at 2030.  CBC and HL ordered with AM labs.   11/09:  APTT @ 20:30 = 47 Will increase heparin drip to 1400 units/hr.  Will recheck aPTT 6 hrs after rate change on   1110 0454 aPTT subtherapeutic, increase to 1500 units/hr and recheck in 6 hours.  Laural Benes, Pharm.D.  Clinical Pharmacist 09/27/2015,6:25 AM

## 2015-09-27 NOTE — Progress Notes (Signed)
SUBJECTIVE: Patient is feeling much better   Filed Vitals:   09/26/15 0911 09/26/15 1101 09/26/15 1948 09/27/15 0534  BP: 128/77 129/70 118/65 113/69  Pulse: 78 72 73 64  Temp: 98.5 F (36.9 C) 98.6 F (37 C) 99.7 F (37.6 C) 98.5 F (36.9 C)  TempSrc: Oral Oral Oral Oral  Resp: 18 19 21 18   Height:      Weight:      SpO2: 97%  100% 100%    Intake/Output Summary (Last 24 hours) at 09/27/15 0918 Last data filed at 09/27/15 0902  Gross per 24 hour  Intake 2883.13 ml  Output   1500 ml  Net 1383.13 ml    LABS: Basic Metabolic Panel:  Recent Labs  09/26/15 0648 09/27/15 0454  NA 130* 133*  K 4.2 4.4  CL 102 105  CO2 20* 20*  GLUCOSE 205* 156*  BUN 27* 33*  CREATININE 1.92* 2.03*  CALCIUM 9.1 8.8*   Liver Function Tests: No results for input(s): AST, ALT, ALKPHOS, BILITOT, PROT, ALBUMIN in the last 72 hours. No results for input(s): LIPASE, AMYLASE in the last 72 hours. CBC:  Recent Labs  09/25/15 1025  09/26/15 0648 09/27/15 0454  WBC 11.7*  --  10.5 9.9  NEUTROABS 10.1*  --   --   --   HGB 8.7*  < > 8.2* 8.0*  HCT 26.3*  --  24.2* 24.1*  MCV 86.5  --  85.6 86.5  PLT 157  --  128* 109*  < > = values in this interval not displayed. Cardiac Enzymes: No results for input(s): CKTOTAL, CKMB, CKMBINDEX, TROPONINI in the last 72 hours. BNP: Invalid input(s): POCBNP D-Dimer: No results for input(s): DDIMER in the last 72 hours. Hemoglobin A1C: No results for input(s): HGBA1C in the last 72 hours. Fasting Lipid Panel: No results for input(s): CHOL, HDL, LDLCALC, TRIG, CHOLHDL, LDLDIRECT in the last 72 hours. Thyroid Function Tests: No results for input(s): TSH, T4TOTAL, T3FREE, THYROIDAB in the last 72 hours.  Invalid input(s): FREET3 Anemia Panel: No results for input(s): VITAMINB12, FOLATE, FERRITIN, TIBC, IRON, RETICCTPCT in the last 72 hours.   PHYSICAL EXAM General: Well developed, well nourished, in no acute distress HEENT:  Normocephalic and  atramatic Neck:  No JVD.  Lungs: Clear bilaterally to auscultation and percussion. Heart: HRRR . Normal S1 and S2 without gallops or murmurs.  Abdomen: Bowel sounds are positive, abdomen soft and non-tender  Msk:  Back normal, normal gait. Normal strength and tone for age. Extremities: No clubbing, cyanosis or edema.   Neuro: Alert and oriented X 3. Psych:  Good affect, responds appropriately  TELEMETRY: Sinus rhythm  ASSESSMENT AND PLAN: Pulmonary embolism with stent in the LAD deployed in July 2016. Advise changing but plantar to Plavix discontinue heparin after starting Eliquis. Also give one unit of blood as is anemic. Advise stopping heparin as platelet count is coming down. Advise stopping Brilanta changing to Plavix. Advise starting Eliquis and transfuse until hemoglobin is 10  Active Problems:   Pulmonary emboli (HCC)   Pulmonary embolism (HCC)    Brook Geraci A, MD, [facility] 09/27/2015 9:18 AM

## 2015-09-27 NOTE — Progress Notes (Signed)
Inpatient Diabetes Program Recommendations  AACE/ADA: New Consensus Statement on Inpatient Glycemic Control (2015)  Target Ranges:  Prepandial:   less than 140 mg/dL      Peak postprandial:   less than 180 mg/dL (1-2 hours)      Critically ill patients:  140 - 180 mg/dL  Results for Louis Alexander, Louis Alexander (MRN [id number]) as of 09/27/2015 09:45  Ref. Range 09/26/2015 07:25 09/26/2015 11:03 09/26/2015 16:13 09/26/2015 20:57  Glucose-Capillary Latest Ref Range: 65-99 mg/dL 187 (H) 289 (H) 256 (H) 196 (H)   Review of Glycemic Control  Current orders for Inpatient glycemic control: Glipizide 10 mg QAM, Invokana 300 mg QAM, Novolog 0-9 units TID with meals, Novolog 0-5 units HS  Inpatient Diabetes Program Recommendations: Insulin - Meal Coverage: Please consider ordering Novolog 5 units TID with meals for meal coverage (in addition to Novolog correction scale). HgbA1C: A1C 9.2% on 09/12/15 indicating poor glycemic control.  Thanks, Barnie Alderman, RN, MSN, CDE Diabetes Coordinator Inpatient Diabetes Program [phone] (Team Pager from [place] to [place]) [phone] ([place] office) [phone] [facility] office) [phone] [facility] office)

## 2015-09-27 NOTE — Progress Notes (Signed)
Patient has been alert and oriented. Vitals are stable. First unit of blood has been transfused, second one started. No signs of reaction. Will continue to monitor. No complaints of pain. Urine from urostomy is still slightly pink tinged, almost tea colored now. No shortness of breath and patient has been able to walk around in the room from the bed to chair, refuses alarm. Normal saline will be restarted once blood finishes.

## 2015-09-27 NOTE — Progress Notes (Signed)
Date: 09/27/2015,   MRN# [id number] Louis Alexander 1941/01/25 Code Status:     Code Status Orders        Start     Ordered   09/25/15 1614  Full code   Continuous     09/25/15 1613    Advance Directive Documentation        Most Recent Value   Type of Advance Directive  Healthcare Power of Attorney   Pre-existing out of facility DNR order (yellow form or pink MOST form)     "MOST" Form in Place?         HPI: In bed, no pleurisy, no increasing shortness of breath. Hematuria decreasing.    PMHX:   Past Medical History  Diagnosis Date  . Abnormal EKG   . Coronary artery disease   . Diabetes mellitus without complication ([place])   . Obesity   . Hyperlipidemia   . Hypertension   . Myocardial infarction ([place]) 2008 and 2012    x 2  . Sleep apnea     could not tolerate cpap mask  . Arthritis     oa  . Cancer [facility])     bladder and prostate  . History of radiation therapy 2012  . History of chemotherapy 2014  . Bladder cancer ([place])   . History of chicken pox    Surgical Hx:  Past Surgical History  Procedure Laterality Date  . Cardiac catheterization  12-06-2003  . Lad stent  2000 x1 stent, 2008 x 1 stent, 2012 x 1 stent  . Hernia repair  yrs ago  . Appendectomy  many yrs ago  . Rotator cuff repair Left yrs ago  . Seed implant to prostate with radiation  2012  . Protatectomy and urostomy  2014  . Total hip arthroplasty Right 12/27/2013    Procedure: RIGHT TOTAL HIP ARTHROPLASTY ANTERIOR APPROACH;  Surgeon: Mauri Pole, MD;  Location: [facility] ORS;  Service: Orthopedics;  Laterality: Right;  . Cardiac catheterization N/A 06/04/2015    Procedure: Left Heart Cath and Coronary Angiography;  Surgeon: Corey Skains, MD;  Location: [place] CV LAB;  Service: Cardiovascular;  Laterality: N/A;  . Cardiac catheterization N/A 06/04/2015    Procedure: Coronary Stent Intervention;  Surgeon: Wellington Hampshire, MD;  Location: [place] CV LAB;  Service: Cardiovascular;   Laterality: N/A;   Family Hx:  Family History  Problem Relation Age of Onset  . Stroke Father 63  . Aneurysm Father   . Heart attack Mother   . Lung cancer Other   . Stomach cancer Other    Social Hx:   Social History  Substance Use Topics  . Smoking status: Never Smoker   . Smokeless tobacco: Never Used  . Alcohol Use: 0.0 oz/week    0 Standard drinks or equivalent per week     Comment: occasional   Medication:    Home Medication:  No current outpatient prescriptions on file.  Current Medication: @CURMEDTAB @   Allergies:  Celebrex and Effient  Review of Systems: Gen:  Denies  fever, sweats, chills HEENT: Denies blurred vision, double vision, ear pain, eye pain, hearing loss, nose bleeds, sore throat Cvc:  No dizziness, chest pain or heaviness Resp:   No sob, no wheezing Gi: Denies swallowing difficulty, stomach pain, nausea or vomiting, diarrhea, constipation, bowel incontinence Gu:  Denies bladder incontinence, burning urine Ext:   No Joint pain, stiffness or swelling Skin: No skin rash, easy bruising or bleeding or hives Endoc:  No polyuria, polydipsia , polyphagia or weight change Psych: No depression, insomnia or hallucinations  Other:  All other systems negative  Physical Examination:   VS: BP 129/65 mmHg  Pulse 65  Temp(Src) 98.5 F (36.9 C) (Oral)  Resp 18  Ht 5\' 10"  (1.778 m)  Wt 185 lb 1.6 oz (83.961 kg)  BMI 26.56 kg/m2  SpO2 100%  General Appearance: No distress  Neuro: without focal findings, mental status, speech normal, alert and oriented, cranial nerves 2-12 intact, reflexes normal and symmetric, sensation grossly normal  HEENT: PERRLA, EOM intact, no ptosis, no other lesions noticed, Mallampati: Pulmonary:.No wheezing, No rales  Sputum Production:   Cardiovascular:  Normal S1,S2.  No m/r/g.  Abdominal aorta pulsation normal.    Abdomen:Benign, Soft, non-tender, No masses, hepatosplenomegaly, No lymphadenopathy Endoc: No evident thyromegaly,  no signs of acromegaly or Cushing features Skin:   warm, no rashes, no ecchymosis  Extremities: normal, no cyanosis, clubbing, no edema, warm with normal capillary refill. Other findings:   Labs results:   Recent Labs     09/25/15  1025  09/25/15  1829  09/26/15  0648  09/26/15  1105  09/27/15  0454  HGB  8.7*  8.0*  8.2*   --   8.0*  HCT  26.3*   --   24.2*   --   24.1*  MCV  86.5   --   85.6   --   86.5  WBC  11.7*   --   10.5   --   9.9  BUN   --    --   27*   --   33*  CREATININE   --    --   1.92*   --   2.03*  GLUCOSE   --    --   205*   --   156*  CALCIUM   --    --   9.1   --   8.8*  INR  1.91   --    --   1.44   --   ,   Rad results:   Assessment and Plan: Bilateral pulmonary emboli,  hematuria better and metastatic prostate cancer.    If ok with cardiology to stop asa and brilinta and restart Eliquis as hematuria subsides.  Following     I have personally obtained a history, examined the patient, evaluated laboratory and imaging results, formulated the assessment and plan and placed orders.  The Patient requires high complexity decision making for assessment and support, frequent evaluation and titration of therapies, application of advanced monitoring technologies and extensive interpretation of multiple databases.   Herbon Fleming,M.D. Pulmonary & Critical care Medicine [facility], [place]

## 2015-09-27 NOTE — Progress Notes (Signed)
[place] [facility] [place] NAME: Louis Alexander    MR#:  [id number]  DATE OF BIRTH:  12/17/1940  SUBJECTIVE:  CHIEF COMPLAINT:   Chief Complaint  Patient presents with  . Shortness of Breath   hematuria is better, no melena or no bloody stool. No hematemesis.  REVIEW OF SYSTEMS:  CONSTITUTIONAL: No fever, has generalized weakness.  EYES: No blurred or double vision.  EARS, NOSE, AND THROAT: No tinnitus or ear pain.  RESPIRATORY: No cough, shortness of breath, wheezing or hemoptysis.  CARDIOVASCULAR: No chest pain, orthopnea, edema.  GASTROINTESTINAL: No nausea, vomiting, diarrhea or abdominal pain.  GENITOURINARY: No dysuria, has hematuria.  ENDOCRINE: No polyuria, nocturia,  HEMATOLOGY: No anemia, easy bruising or bleeding SKIN: No rash or lesion. MUSCULOSKELETAL: No joint pain or arthritis.   NEUROLOGIC: No tingling, numbness, weakness.  PSYCHIATRY: No anxiety or depression.   DRUG ALLERGIES:   Allergies  Allergen Reactions  . Celebrex [Celecoxib]     Hematuria Other reaction(s): Bleeding Bleeding in bladder  . Effient [Prasugrel] Other (See Comments)    blood clots, blood in urine Other reaction(s): Bleeding Bleeding in bladder    VITALS:  Blood pressure 120/65, pulse 68, temperature 98.2 F (36.8 C), temperature source Oral, resp. rate 18, height 5\' 10"  (1.778 m), weight 83.961 kg (185 lb 1.6 oz), SpO2 100 %.  PHYSICAL EXAMINATION:  GENERAL:  74 y.o.-year-old patient lying in the bed with no acute distress.  EYES: Pupils equal, round, reactive to light and accommodation. No scleral icterus. Extraocular muscles intact.  HEENT: Head atraumatic, normocephalic. Oropharynx and nasopharynx clear.  NECK:  Supple, no jugular venous distention. No thyroid enlargement, no tenderness.  LUNGS: Normal breath sounds bilaterally, no wheezing, rales,rhonchi or crepitation. No use of accessory muscles of respiration.   CARDIOVASCULAR: S1, S2 normal. No murmurs, rubs, or gallops.  ABDOMEN: Soft, nontender, nondistended. Bowel sounds present. No organomegaly or mass. Urostomy bag. EXTREMITIES: No pedal edema, cyanosis, or clubbing.  NEUROLOGIC: Cranial nerves II through XII are intact. Muscle strength 5/5 in all extremities. Sensation intact. Gait not checked.  PSYCHIATRIC: The patient is alert and oriented x 3.  SKIN: No obvious rash, lesion, or ulcer.    LABORATORY PANEL:   CBC  Recent Labs Lab 09/27/15 0454  WBC 9.9  HGB 8.0*  HCT 24.1*  PLT 109*   ------------------------------------------------------------------------------------------------------------------  Chemistries   Recent Labs Lab 09/27/15 0454  NA 133*  K 4.4  CL 105  CO2 20*  GLUCOSE 156*  BUN 33*  CREATININE 2.03*  CALCIUM 8.8*   ------------------------------------------------------------------------------------------------------------------  Cardiac Enzymes No results for input(s): TROPONINI in the last 168 hours. ------------------------------------------------------------------------------------------------------------------  RADIOLOGY:  US Venous Img Lower Bilateral  09/26/2015  CLINICAL DATA:  History of pulmonary embolism (diagnosed 09/21/2015. Post IVC filter placement (09/26/2015). History of malignancy (lymphoma and lung cancer). Evaluate for DVT. EXAM: BILATERAL LOWER EXTREMITY VENOUS DOPPLER ULTRASOUND TECHNIQUE: Gray-scale sonography with graded compression, as well as color Doppler and duplex ultrasound were performed to evaluate the lower extremity deep venous systems from the level of the common femoral vein and including the common femoral, femoral, profunda femoral, popliteal and calf veins including the posterior tibial, peroneal and gastrocnemius veins when visible. The superficial great saphenous vein was also interrogated. Spectral Doppler was utilized to evaluate flow at rest and with distal  augmentation maneuvers in the common femoral, femoral and popliteal veins. COMPARISON:  None. FINDINGS: RIGHT LOWER EXTREMITY There is hypoechoic nonocclusive thrombus within the  right common femoral vein (image 7). There is a mixed echogenic expansile near occlusive thrombus within the proximal (image 9), mid (image 10) and distal (image 11) aspects of the right superficial femoral vein. There is hypoechoic expansile near occlusive thrombus within the proximal this vessel aspects of the right popliteal vein (images 12 and 13). The paired right posterior tibial (image 14) and peroneal (image 5) veins appear occluded where imaged. The right deep femoral vein appears patent were imaged. The right greater saphenous vein as well as the saphenous femoral junction appears patent were imaged. LEFT LOWER EXTREMITY There is hypoechoic nonocclusive thrombus within the left common femoral vein (image 37). There is hypoechoic occlusive thrombus seen throughout the imaged course of the left superficial femoral vein (representative images 40 through 42; image 53 through 58). There is hypoechoic occlusive thrombus within the left deep femoral vein (image 39). There is hypoechoic expansile occlusive thrombus within the proximal and distal aspect of the left popliteal vein (representative images 43 and 44). The paired left posterior tibial veins appear occlude were imaged (image 45). The left peroneal vein appears occluded were imaged. The greater saphenous vein as well as the saphenofemoral junction appears patent where imaged. 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 Electronically Signed   By: Sandi Mariscal M.D.   On: 09/26/2015 15:33    EKG:   Orders placed or performed during the hospital encounter of 09/25/15  . EKG 12-Lead  . EKG 12-Lead  . ED EKG  . ED EKG    ASSESSMENT AND PLAN:   1.  Pulmonary emboli in the right main pulmonary artery and segmental branches on the left.  S/p IVC filter. Started heparin yesterday. Discontinue heparin drip, add Plavix and continue aspirin, discontinued Brilinta per Dr. Humphrey Rolls.   2. Hematuria: Better. Discontinued  Brilinta,  follow-up urology as outpatient in 2 weeks after discharge.  3. Guaiac positive stool: No active bleeding, follow-up hemoglobin. Continue pantoprazole 40 mg by mouth twice a day  4. History of metastatic prostate cancer: Follow-up of [name] as outpatient.   5. Acute on chronic anemia with blood loss: PRBC transfusion 2 units to keep hemoglobin more than 10 per  Dr. Humphrey Rolls. Follow-up hemoglobin.   6. Type 2 diabetes without complication: on sliding scale insulin, glipizide and ADA diet.  7. CAD with previous stents: Continue aspirin, Plavix, simvastatin and sotalol,  discontinued Brilinta.  * Acute renal failure due to dehydration. Started normal saline IV and follow-up BMP. *Hyponatremia. Improving. Normal saline IV and follow-up BMP. *Thrombocytopenia. Follow-up CBC.   I discussed with Dr. Humphrey Rolls. All the records are reviewed and case discussed with Care Management/Social Workerr. Management plans discussed with the patient, family and they are in agreement. Greater than 50% time was spent on coordination of care and face-to-face counseling.  CODE STATUS: Full code  TOTAL TIME TAKING CARE OF THIS PATIENT: 42 minutes.   POSSIBLE D/C IN 2-3 DAYS, DEPENDING ON CLINICAL CONDITION.   Demetrios Loll M.D on 09/27/2015 at 2:10 PM  Between 7am to 6pm - Pager - [phone]  After 6pm go to [email or web address] - password [email or web address] [facility]  Office  [phone]  CC: Primary care physician; Lelon Huh, MD

## 2015-09-28 LAB — BASIC METABOLIC PANEL
ANION GAP: 7 (ref 5–15)
BUN: 31 mg/dL — ABNORMAL HIGH (ref 6–20)
CALCIUM: 8.6 mg/dL — AB (ref 8.9–10.3)
CO2: 20 mmol/L — AB (ref 22–32)
Chloride: 108 mmol/L (ref 101–111)
Creatinine, Ser: 1.78 mg/dL — ABNORMAL HIGH (ref 0.61–1.24)
GFR calc non Af Amer: 36 mL/min — ABNORMAL LOW (ref >60)
GFR, EST AFRICAN AMERICAN: 42 mL/min — AB (ref >60)
Glucose, Bld: 136 mg/dL — ABNORMAL HIGH (ref 65–99)
Potassium: 3.9 mmol/L (ref 3.5–5.1)
Sodium: 135 mmol/L (ref 135–145)

## 2015-09-28 LAB — GLUCOSE, CAPILLARY
GLUCOSE-CAPILLARY: 118 mg/dL — AB (ref 65–99)
GLUCOSE-CAPILLARY: 220 mg/dL — AB (ref 65–99)
GLUCOSE-CAPILLARY: 156 mg/dL — AB (ref 65–99)
GLUCOSE-CAPILLARY: 167 mg/dL — AB (ref 65–99)

## 2015-09-28 LAB — TYPE AND SCREEN
ABO/RH(D): O NEG
Antibody Screen: NEGATIVE
UNIT DIVISION: 0
Unit division: 0

## 2015-09-28 LAB — CBC
HCT: 29.1 % — ABNORMAL LOW (ref 40.0–52.0)
HEMOGLOBIN: 9.9 g/dL — AB (ref 13.0–18.0)
MCH: 29.1 pg (ref 26.0–34.0)
MCHC: 34.1 g/dL (ref 32.0–36.0)
MCV: 85.2 fL (ref 80.0–100.0)
Platelets: 94 10*3/uL — ABNORMAL LOW (ref 150–440)
RBC: 3.41 MIL/uL — AB (ref 4.40–5.90)
RDW: 17.7 % — ABNORMAL HIGH (ref 11.5–14.5)
WBC: 9.9 10*3/uL (ref 3.8–10.6)

## 2015-09-28 LAB — MAGNESIUM: MAGNESIUM: 1.8 mg/dL (ref 1.7–2.4)

## 2015-09-28 MED ORDER — HEPARIN SOD (PORK) LOCK FLUSH 10 UNIT/ML IV SOLN
10.0000 [IU] | INTRAVENOUS | Status: DC | PRN
  Filled 2015-09-28: qty 1

## 2015-09-28 MED ORDER — HEPARIN SOD (PORK) LOCK FLUSH 10 UNIT/ML IV SOLN
10.0000 [IU] | Freq: Two times a day (BID) | INTRAVENOUS | Status: DC
  Filled 2015-09-28 (×2): qty 1

## 2015-09-28 NOTE — Progress Notes (Addendum)
[facility] [place] NAME: Louis Alexander    MR#:  [id number]  DATE OF BIRTH:  20-Oct-1941  SUBJECTIVE:  CHIEF COMPLAINT:   Chief Complaint  Patient presents with  . Shortness of Breath   No hematuria, melena or bloody stool. No hematemesis. SOB on exacertion.  REVIEW OF SYSTEMS:  CONSTITUTIONAL: No fever, has generalized weakness.  EYES: No blurred or double vision.  EARS, NOSE, AND THROAT: No tinnitus or ear pain.  RESPIRATORY: No cough, shortness of breath on exacertion,  No wheezing or hemoptysis.  CARDIOVASCULAR: No chest pain, orthopnea, edema.  GASTROINTESTINAL: No nausea, vomiting, diarrhea or abdominal pain.  GENITOURINARY: No dysuria, no hematuria.  ENDOCRINE: No polyuria, nocturia,  HEMATOLOGY: No anemia, easy bruising or bleeding SKIN: No rash or lesion.  MUSCULOSKELETAL: No joint pain or arthritis.   NEUROLOGIC: No tingling, numbness, weakness.  PSYCHIATRY: No anxiety or depression.   DRUG ALLERGIES:   Allergies  Allergen Reactions  . Celebrex [Celecoxib]     Hematuria Other reaction(s): Bleeding Bleeding in bladder  . Effient [Prasugrel] Other (See Comments)    blood clots, blood in urine Other reaction(s): Bleeding Bleeding in bladder    VITALS:  Blood pressure 113/65, pulse 66, temperature 98.4 F (36.9 C), temperature source Oral, resp. rate 18, height 5\' 10"  (1.778 m), weight 83.961 kg (185 lb 1.6 oz), SpO2 99 %.  PHYSICAL EXAMINATION:  GENERAL:  74 y.o.-year-old patient lying in the bed with no acute distress.  EYES: Pupils equal, round, reactive to light and accommodation. No scleral icterus. Extraocular muscles intact.  HEENT: Head atraumatic, normocephalic. Oropharynx and nasopharynx clear.  NECK:  Supple, no jugular venous distention. No thyroid enlargement, no tenderness.  LUNGS: Normal breath sounds bilaterally, no wheezing, rales,rhonchi or crepitation. No use of accessory muscles of  respiration.  CARDIOVASCULAR: S1, S2 normal. No murmurs, rubs, or gallops.  ABDOMEN: Soft, nontender, nondistended. Bowel sounds present. No organomegaly or mass. Urostomy bag. EXTREMITIES: No pedal edema, cyanosis, or clubbing.  NEUROLOGIC: Cranial nerves II through XII are intact. Muscle strength 5/5 in all extremities. Sensation intact. Gait not checked.  PSYCHIATRIC: The patient is alert and oriented x 3.  SKIN: No obvious rash, lesion, or ulcer.    LABORATORY PANEL:   CBC  Recent Labs Lab 09/28/15 0606  WBC 9.9  HGB 9.9*  HCT 29.1*  PLT 94*   ------------------------------------------------------------------------------------------------------------------  Chemistries   Recent Labs Lab 09/28/15 0606  NA 135  K 3.9  CL 108  CO2 20*  GLUCOSE 136*  BUN 31*  CREATININE 1.78*  CALCIUM 8.6*  MG 1.8   ------------------------------------------------------------------------------------------------------------------  Cardiac Enzymes No results for input(s): TROPONINI in the last 168 hours. ------------------------------------------------------------------------------------------------------------------  RADIOLOGY:  US Venous Img Lower Bilateral  09/26/2015  CLINICAL DATA:  History of pulmonary embolism (diagnosed 09/21/2015. Post IVC filter placement (09/26/2015). History of malignancy (lymphoma and lung cancer). Evaluate for DVT. EXAM: BILATERAL LOWER EXTREMITY VENOUS DOPPLER ULTRASOUND TECHNIQUE: Gray-scale sonography with graded compression, as well as color Doppler and duplex ultrasound were performed to evaluate the lower extremity deep venous systems from the level of the common femoral vein and including the common femoral, femoral, profunda femoral, popliteal and calf veins including the posterior tibial, peroneal and gastrocnemius veins when visible. The superficial great saphenous vein was also interrogated. Spectral Doppler was utilized to evaluate flow at rest  and with distal augmentation maneuvers in the common femoral, femoral and popliteal veins. COMPARISON:  None. FINDINGS: RIGHT LOWER  EXTREMITY There is hypoechoic nonocclusive thrombus within the right common femoral vein (image 7). There is a mixed echogenic expansile near occlusive thrombus within the proximal (image 9), mid (image 10) and distal (image 11) aspects of the right superficial femoral vein. There is hypoechoic expansile near occlusive thrombus within the proximal this vessel aspects of the right popliteal vein (images 12 and 13). The paired right posterior tibial (image 14) and peroneal (image 5) veins appear occluded where imaged. The right deep femoral vein appears patent were imaged. The right greater saphenous vein as well as the saphenous femoral junction appears patent were imaged. LEFT LOWER EXTREMITY There is hypoechoic nonocclusive thrombus within the left common femoral vein (image 37). There is hypoechoic occlusive thrombus seen throughout the imaged course of the left superficial femoral vein (representative images 40 through 42; image 53 through 58). There is hypoechoic occlusive thrombus within the left deep femoral vein (image 39). There is hypoechoic expansile occlusive thrombus within the proximal and distal aspect of the left popliteal vein (representative images 43 and 44). The paired left posterior tibial veins appear occlude were imaged (image 45). The left peroneal vein appears occluded were imaged. The greater saphenous vein as well as the saphenofemoral junction appears patent where imaged. 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 Electronically Signed   By: Sandi Mariscal M.D.   On: 09/26/2015 15:33    EKG:   Orders placed or performed during the hospital encounter of 09/25/15  . EKG 12-Lead  . EKG 12-Lead  . ED EKG  . ED EKG    ASSESSMENT AND  PLAN:   1. Pulmonary emboli in the right main pulmonary artery and segmental branches on the left.  S/p IVC filter. Discontinued heparin yesterday. Started Plavix and resumed eliquis yesterday, continue aspirin, discontinued Brilinta, Per Dr. Humphrey Rolls, watch for bleeding and monitor hemoglobin and platelets for 1-2 days.  2. Hematuria: Improved. Discontinued  Brilinta,  follow-up urology as outpatient in 2 weeks after discharge.  3. Guaiac positive stool: No active bleeding, follow-up hemoglobin. Continue pantoprazole 40 mg by mouth twice a day  4. History of metastatic prostate cancer: Follow-up of [name] as outpatient.   5. Acute on chronic anemia with blood loss: Status post PRBC transfusion 2 units,  keep hemoglobin more than 10 per  Dr. Humphrey Rolls. Follow-up hemoglobin.   6. Type 2 diabetes without complication: on sliding scale insulin, glipizide and ADA diet.  7. CAD with previous stents: Continue aspirin, Plavix, simvastatin and sotalol,  discontinued Brilinta.  * Acute renal failure due to dehydration. Improving, continue normal saline IV and follow-up BMP. *Hyponatremia. Improved.. *Thrombocytopenia. Worsening, Follow-up CBC.   I discussed with Dr. Raul Del. All the records are reviewed and case discussed with Care Management/Social Workerr. Management plans discussed with the patient, his daughter and they are in agreement. Greater than 50% time was spent on coordination of care and face-to-face counseling.  CODE STATUS: Full code  TOTAL TIME TAKING CARE OF THIS PATIENT: 38 minutes.   POSSIBLE D/C IN 2 DAYS, DEPENDING ON CLINICAL CONDITION.   Demetrios Loll M.D on 09/28/2015 at 1:55 PM  Between 7am to 6pm - Pager - [phone]  After 6pm go to [email or web address] - password [email or web address] [facility]  Office  [phone]  CC: Primary care physician; Lelon Huh, MD

## 2015-09-28 NOTE — Care Management (Signed)
Spoke with patients daughter Lelan Pons about agency choice for home health.  [facility] is in network with his insurance.  [facility] would not be able to staff nursing for at least 2 weeks.  Notified [facility] of referral.  Have confirmed the contact information and current with PCP- Lelon Huh.  Dr Laurelyn Sickle is following for cardiology.

## 2015-09-28 NOTE — Care Management Important Message (Signed)
Important Message  Patient Details  Name: Louis Alexander MRN: [id number] Date of Birth: [birth year]   Medicare Important Message Given:  Yes    Katrina Stack, RN 09/28/2015, 9:04 AM

## 2015-09-28 NOTE — Progress Notes (Signed)
Pharmacist/ Provider Communication  Spoke with PA Dorena Cookey about patient's Apixaban dose. Patient previously on Apixaban 5 mg PO BID prior to admission; however is currently admitted with a PE and hematuria. Patient is also on Ticagrelor and Aspirin for secondary prophylaxis of myocardial infaraction  Recommended possibly restarting Apixaban 10 mg PO BID x 7 days; however provider would like to continue Apixaban 5 mg PO BID due to patient bleeding and increased risk with concomitant use of Ticagrelor and Aspirin   Larene Beach, PharmD

## 2015-09-28 NOTE — Progress Notes (Signed)
SUBJECTIVE: patient is feeling much better   Filed Vitals:   09/27/15 2012 09/27/15 2151 09/28/15 0600 09/28/15 0734  BP: 118/60 112/62 123/70 127/71  Pulse: 72 69 75 70  Temp: 98.9 F (37.2 C) 99.2 F (37.3 C) 99.5 F (37.5 C) 99 F (37.2 C)  TempSrc: Oral Oral Oral Oral  Resp: 19 19 18 14   Height:      Weight:      SpO2: 100% 99% 97% 100%    Intake/Output Summary (Last 24 hours) at 09/28/15 0858 Last data filed at 09/28/15 0602  Gross per 24 hour  Intake   1188 ml  Output   3600 ml  Net  -2412 ml    LABS: Basic Metabolic Panel:  Recent Labs  09/27/15 0454 09/28/15 0606  NA 133* 135  K 4.4 3.9  CL 105 108  CO2 20* 20*  GLUCOSE 156* 136*  BUN 33* 31*  CREATININE 2.03* 1.78*  CALCIUM 8.8* 8.6*  MG  --  1.8   Liver Function Tests: No results for input(s): AST, ALT, ALKPHOS, BILITOT, PROT, ALBUMIN in the last 72 hours. No results for input(s): LIPASE, AMYLASE in the last 72 hours. CBC:  Recent Labs  09/25/15 1025  09/27/15 0454 09/28/15 0606  WBC 11.7*  < > 9.9 9.9  NEUTROABS 10.1*  --   --   --   HGB 8.7*  < > 8.0* 9.9*  HCT 26.3*  < > 24.1* 29.1*  MCV 86.5  < > 86.5 85.2  PLT 157  < > 109* 94*  < > = values in this interval not displayed. Cardiac Enzymes: No results for input(s): CKTOTAL, CKMB, CKMBINDEX, TROPONINI in the last 72 hours. BNP: Invalid input(s): POCBNP D-Dimer: No results for input(s): DDIMER in the last 72 hours. Hemoglobin A1C: No results for input(s): HGBA1C in the last 72 hours. Fasting Lipid Panel: No results for input(s): CHOL, HDL, LDLCALC, TRIG, CHOLHDL, LDLDIRECT in the last 72 hours. Thyroid Function Tests: No results for input(s): TSH, T4TOTAL, T3FREE, THYROIDAB in the last 72 hours.  Invalid input(s): FREET3 Anemia Panel: No results for input(s): VITAMINB12, FOLATE, FERRITIN, TIBC, IRON, RETICCTPCT in the last 72 hours.   PHYSICAL EXAM General: Well developed, well nourished, in no acute distress HEENT:   Normocephalic and atramatic Neck:  No JVD.  Lungs: Clear bilaterally to auscultation and percussion. Heart: HRRR . Normal S1 and S2 without gallops or murmurs.  Abdomen: Bowel sounds are positive, abdomen soft and non-tender  Msk:  Back normal, normal gait. Normal strength and tone for age. Extremities: No clubbing, cyanosis or edema.   Neuro: Alert and oriented X 3. Psych:  Good affect, responds appropriately  TELEMETRY: sinus rhythm  ASSESSMENT AND PLAN: pulmonary embolism with history of PCI and stenting in July 2016 mid LAD requiring aspirin and Plavix, complicated by treatment  with anticoagulation. Patient had problem now with the IV heparin with platelet-induced thrombocytopenia. Thus heparin was discontinued and Eliquis was started at 5 mg by mouth twice a day. Advise no heparin flushes and advise observation for another day or 2 so that the platelets don't go down any further and the hemoglobin doesn't go down any further.  Active Problems:   Pulmonary emboli ([facility])   Pulmonary embolism ([facility])    Neoma Laming A, MD, [facility] 09/28/2015 8:58 AM

## 2015-09-28 NOTE — Care Management (Signed)
spoke with patient and his daughter Lelan Pons.  Lelan Pons states that cardiology stated that patient will need to stay a few more days because patient's wife is being discharged to skilled nursing facility today and "there is no one at home."  Discussed that medical necessity must be met for continued stay.  Discussed home health nursing PT and aide services;  and In home contuous care services.  Provided with list of agencies for bother.  Patient's insurance is only in network with [facility].  Of not, it appears that cardiology has restarted Eliquis and patient's platelets are 94 today.  11/1 platelets were 395.  This may require additional observation

## 2015-09-28 NOTE — Evaluation (Signed)
Physical Therapy Evaluation Patient Details Name: AHSIR SIMA MRN: [id number] DOB: December 03, 1940 Today's Date: 09/28/2015   History of Present Illness  Pt is a 74 y.o. male with recent diagnosis of PE early November admitted to hospital with weakness and hematuria on Eliquis.  [place] B LE's shows extensive mixed occlusive and non-occlusive DVT involving B common, superficial, and popliteal veins B.  Pt s/p IVC filter 09/25/15 and has had 2 units PRBC's transfused during hospital stay.  Pt now with platelet-induced thrombocytopenia.  PMH includes metastatic prostate CA, CAD, R THA, palliative chemo.  Clinical Impression  Currently pt demonstrates impairments with strength, balance, and limitations with functional mobility.  Prior to admission, pt was independent ambulating without AD (pt started using B axillary crutches 1 week ago d/t weakness).  Pt lives with his wife in 1 level home with 3 steps to enter with railings (pt's wife currently in [place] but pt's daughter is able to help at night and pt reports church friends will help during the day as needed).  Currently pt is CGA with transfers and ambulation with RW.  Pt would benefit from skilled PT to address above noted impairments and functional limitations.  Recommend pt discharge to home with HHPT and support of family/friends when medically appropriate.     Follow Up Recommendations Home health PT    Equipment Recommendations  Rolling walker with 5" wheels (pt reports he is going to try to get a RW through his church if possible)    Recommendations for Other Services       Precautions / Restrictions Precautions Precautions: Fall Restrictions Weight Bearing Restrictions: No      Mobility  Bed Mobility Overal bed mobility: Modified Independent             General bed mobility comments: Supine to/from sit with HOB elevated  Transfers Overall transfer level: Needs assistance Equipment used: Rolling walker (2  wheeled) Transfers: Sit to/from Stand Sit to Stand: Min guard         General transfer comment: steady without loss of balance  Ambulation/Gait Ambulation/Gait assistance: Min guard Ambulation Distance (Feet): 200 Feet Assistive device: Rolling walker (2 wheeled) Gait Pattern/deviations: Step-through pattern Gait velocity: mild decrease   General Gait Details: pt ambulating on side of R foot and appearing mildly antalgic (pt denying any pain but reports he usually walks with shoes on)  Stairs            Wheelchair Mobility    Modified Rankin (Stroke Patients Only)       Balance Overall balance assessment: Needs assistance Sitting-balance support: No upper extremity supported;Feet supported Sitting balance-Leahy Scale: Good     Standing balance support: Bilateral upper extremity supported (on RW) Standing balance-Leahy Scale: Good                               Pertinent Vitals/Pain Pain Assessment: No/denies pain  Vitals stable and WFL throughout treatment session.    Home Living Family/patient expects to be discharged to:: Private residence Living Arrangements: Spouse/significant other Available Help at Discharge: Family Type of Home: House Home Access: Stairs to enter Entrance Stairs-Rails: Psychiatric nurse of Steps: 3 Home Layout: One level Home Equipment: Crutches      Prior Function Level of Independence: Independent with assistive device(s)         Comments: Pt was independent without AD until a week ago and needed to use B axillary crutches d/t  weakness.  Pt denies any recent falls.  Pt's wife at [facility] and pt's daughter plans to stay with pt during night and will have church friends during the day.     Hand Dominance        Extremity/Trunk Assessment   Upper Extremity Assessment: Generalized weakness           Lower Extremity Assessment: Generalized weakness         Communication   Communication:  No difficulties  Cognition Arousal/Alertness: Awake/alert Behavior During Therapy: WFL for tasks assessed/performed Overall Cognitive Status: Within Functional Limits for tasks assessed                      General Comments General comments (skin integrity, edema, etc.): urostomy bag   Nursing cleared pt for participation in physical therapy.  Pt agreeable to PT session.    Exercises  Encouraged pt to ambulate with staff using RW.      Assessment/Plan    PT Assessment Patient needs continued PT services  PT Diagnosis Difficulty walking;Generalized weakness   PT Problem List Decreased strength;Decreased activity tolerance;Decreased balance;Decreased mobility  PT Treatment Interventions DME instruction;Gait training;Stair training;Functional mobility training;Therapeutic activities;Therapeutic exercise;Balance training;Neuromuscular re-education;Patient/family education   PT Goals (Current goals can be found in the Care Plan section) Acute Rehab PT Goals Patient Stated Goal: to go home PT Goal Formulation: With patient Time For Goal Achievement: 10/12/15 Potential to Achieve Goals: Good    Frequency Min 2X/week   Barriers to discharge        Co-evaluation               End of Session Equipment Utilized During Treatment: Gait belt Activity Tolerance: Patient tolerated treatment well Patient left: in bed;with call bell/phone within reach (pt refused bed alarm; nursing notified) Nurse Communication: Mobility status         Time: [id number] PT Time Calculation (min) (ACUTE ONLY): 27 min   Charges:   PT Evaluation $Initial PT Evaluation Tier I: 1 Procedure     PT G CodesLeitha Bleak 09-29-2015, 4:01 PM Leitha Bleak, [place]

## 2015-09-29 ENCOUNTER — Other Ambulatory Visit: Payer: Self-pay | Admitting: Hematology and Oncology

## 2015-09-29 ENCOUNTER — Encounter: Payer: Self-pay | Admitting: Gastroenterology

## 2015-09-29 DIAGNOSIS — D696 Thrombocytopenia, unspecified: Secondary | ICD-10-CM

## 2015-09-29 DIAGNOSIS — D62 Acute posthemorrhagic anemia: Secondary | ICD-10-CM

## 2015-09-29 DIAGNOSIS — N179 Acute kidney failure, unspecified: Secondary | ICD-10-CM

## 2015-09-29 DIAGNOSIS — C61 Malignant neoplasm of prostate: Secondary | ICD-10-CM

## 2015-09-29 DIAGNOSIS — R631 Polydipsia: Secondary | ICD-10-CM

## 2015-09-29 DIAGNOSIS — E871 Hypo-osmolality and hyponatremia: Secondary | ICD-10-CM

## 2015-09-29 DIAGNOSIS — Z95828 Presence of other vascular implants and grafts: Secondary | ICD-10-CM

## 2015-09-29 DIAGNOSIS — R9431 Abnormal electrocardiogram [ECG] [EKG]: Secondary | ICD-10-CM

## 2015-09-29 DIAGNOSIS — I82409 Acute embolism and thrombosis of unspecified deep veins of unspecified lower extremity: Secondary | ICD-10-CM

## 2015-09-29 DIAGNOSIS — R319 Hematuria, unspecified: Secondary | ICD-10-CM

## 2015-09-29 DIAGNOSIS — R195 Other fecal abnormalities: Secondary | ICD-10-CM

## 2015-09-29 DIAGNOSIS — IMO0001 Reserved for inherently not codable concepts without codable children: Secondary | ICD-10-CM

## 2015-09-29 LAB — GLUCOSE, CAPILLARY
GLUCOSE-CAPILLARY: 215 mg/dL — AB (ref 65–99)
Glucose-Capillary: 191 mg/dL — ABNORMAL HIGH (ref 65–99)
Glucose-Capillary: 231 mg/dL — ABNORMAL HIGH (ref 65–99)

## 2015-09-29 LAB — CBC
HCT: 30 % — ABNORMAL LOW (ref 40.0–52.0)
HCT: 30.9 % — ABNORMAL LOW (ref 40.0–52.0)
Hemoglobin: 10.3 g/dL — ABNORMAL LOW (ref 13.0–18.0)
Hemoglobin: 10.2 g/dL — ABNORMAL LOW (ref 13.0–18.0)
MCH: 29.3 pg (ref 26.0–34.0)
MCH: 28.2 pg (ref 26.0–34.0)
MCHC: 34.3 g/dL (ref 32.0–36.0)
MCHC: 33 g/dL (ref 32.0–36.0)
MCV: 85.5 fL (ref 80.0–100.0)
MCV: 85.4 fL (ref 80.0–100.0)
PLATELETS: 100 10*3/uL — AB (ref 150–440)
PLATELETS: 112 10*3/uL — AB (ref 150–440)
RBC: 3.51 MIL/uL — AB (ref 4.40–5.90)
RBC: 3.61 MIL/uL — AB (ref 4.40–5.90)
RDW: 17.8 % — ABNORMAL HIGH (ref 11.5–14.5)
RDW: 18.1 % — AB (ref 11.5–14.5)
WBC: 10.8 10*3/uL — AB (ref 3.8–10.6)
WBC: 10.9 10*3/uL — AB (ref 3.8–10.6)

## 2015-09-29 LAB — CREATININE, SERUM
CREATININE: 1.77 mg/dL — AB (ref 0.61–1.24)
GFR calc non Af Amer: 36 mL/min — ABNORMAL LOW (ref >60)
GFR, EST AFRICAN AMERICAN: 42 mL/min — AB (ref >60)

## 2015-09-29 LAB — PROTIME-INR
INR: 1.37
PROTHROMBIN TIME: 17 s — AB (ref 11.4–15.0)

## 2015-09-29 MED ORDER — MOMETASONE FURO-FORMOTEROL FUM 100-5 MCG/ACT IN AERO: 2.0000 | INHALATION_SPRAY | Freq: Two times a day (BID) | RESPIRATORY_TRACT | Status: DC

## 2015-09-29 MED ORDER — CLOPIDOGREL BISULFATE 75 MG PO TABS: 75.0000 mg | ORAL_TABLET | Freq: Every day | ORAL | Status: DC

## 2015-09-29 MED ORDER — CHLORPROMAZINE HCL 10 MG PO TABS: 10.0000 mg | ORAL_TABLET | Freq: Three times a day (TID) | ORAL | Status: DC | PRN

## 2015-09-29 NOTE — Consult Note (Signed)
GI Inpatient Consult Note  Reason for Consult: Heme positive stool   Attending Requesting Consult:  History of Present Illness: Louis Alexander is a 74 y.o. male with metastatic prostate cancer, CAD, and PE, who was admitted with hematuria and increasing SOB. Has been on eliquis and plavix in the past. Switched to [place]. Stopped and pt placed on heparin drip. Hematuria stopped. IVC filter placed. Breathing ok now. Found to have heme positive stool. No GI sxs at all otherwise. No prior hx of PUD. No prior EGD/colonoscopy.  Past Medical History:  Past Medical History  Diagnosis Date  . Abnormal EKG   . Coronary artery disease   . Diabetes mellitus without complication ([place])   . Obesity   . Hyperlipidemia   . Hypertension   . Myocardial infarction ([place]) 2008 and 2012    x 2  . Sleep apnea     could not tolerate cpap mask  . Arthritis     oa  . Cancer [facility])     bladder and prostate  . History of radiation therapy 2012  . History of chemotherapy 2014  . Bladder cancer ([place])   . History of chicken pox     Problem List: Patient Active Problem List   Diagnosis Date Noted  . Pulmonary emboli ([place]) 09/25/2015  . Pulmonary embolism ([place]) 09/25/2015  . Atrial fibrillation ([place]) 09/13/2015  . Bladder cancer ([place]) 06/11/2015  . Dyslipidemia 06/11/2015  . Arthralgia of hip 06/11/2015  . History of myocardial infarction 06/11/2015  . Degenerative arthritis of hip 06/11/2015  . Urothelial cancer ([place]) 03/16/2015  . S/P right THA, AA 12/27/2013  . Cancer of lateral wall of urinary bladder ([place]) 01/28/2013  . Prostate cancer ([place]) 01/14/2013  . Incomplete bladder emptying 01/14/2013  . Frank hematuria 01/14/2013  . Difficult or painful urination 01/14/2013  . History of colon polyps 11/01/2010  . 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 Diabetes mellitus type 2, uncontrolled ([place]) 08/31/2008  . Diabetes mellitus with renal complications ([place]) [id number]  .  Benign prostatic hypertrophy without urinary obstruction 07/31/2008  . Coronary artery disease 07/31/2008  . Disturbance of skin sensation 07/26/2008  . Essential (primary) hypertension 07/26/2008  . Idiopathic peripheral autonomic neuropathy 07/26/2008    Past Surgical History: Past Surgical History  Procedure Laterality Date  . Cardiac catheterization  12-06-2003  . Lad stent  2000 x1 stent, 2008 x 1 stent, 2012 x 1 stent  . Hernia repair  yrs ago  . Appendectomy  many yrs ago  . Rotator cuff repair Left yrs ago  . Seed implant to prostate with radiation  2012  . Protatectomy and urostomy  2014  . Total hip arthroplasty Right 12/27/2013    Procedure: RIGHT TOTAL HIP ARTHROPLASTY ANTERIOR APPROACH;  Surgeon: Mauri Pole, MD;  [facility]: [facility];  Service: Orthopedics;  Laterality: Right;  . Cardiac catheterization N/A 06/04/2015    Procedure: Left Heart Cath and Coronary Angiography;  Surgeon: Corey Skains, MD;  Location: [place] [facility];  Service: Cardiovascular;  Laterality: N/A;  . Cardiac catheterization N/A 06/04/2015    Procedure: Coronary Stent Intervention;  Surgeon: Wellington Hampshire, MD;  Location: [place] [facility];  Service: Cardiovascular;  Laterality: N/A;    Allergies: Allergies  Allergen Reactions  . Celebrex [Celecoxib]     Hematuria Other reaction(s): Bleeding Bleeding in bladder  . Effient [Prasugrel] Other (See Comments)    blood clots, blood in urine Other reaction(s): Bleeding Bleeding in bladder  Home Medications: Prescriptions prior to admission  Medication Sig Dispense Refill Last Dose  . apixaban (ELIQUIS) 5 MG TABS tablet Take 5 mg by mouth 2 (two) times daily.   09/24/2015 at Unknown time  . aspirin EC 81 MG tablet Take 81 mg by mouth daily.   09/24/2015 at Unknown time  . atenolol (TENORMIN) 25 MG tablet Take 25 mg by mouth daily.   09/24/2015 at Unknown time  . canagliflozin (INVOKANA) 300 MG TABS tablet Take 300 mg by mouth daily before  breakfast. 30 tablet 3 09/24/2015 at Unknown time  . Coenzyme Q10 10 MG capsule Take 10 mg by mouth.   09/24/2015 at Unknown time  . fentaNYL (DURAGESIC - DOSED MCG/HR) 75 MCG/HR Place 1 patch (75 mcg total) onto the skin every 3 (three) days. 10 patch 0 09/23/2015 at Unknown  . Fluticasone Furoate-Vilanterol (BREO ELLIPTA) 100-25 MCG/INH AEPB Inhale 1 Inhaler into the lungs daily.   09/24/2015 at Unknown time  . furosemide (LASIX) 20 MG tablet Take 20 mg by mouth 2 (two) times daily.   09/24/2015 at Unknown time  . glipiZIDE (GLUCOTROL) 10 MG tablet TAKE 1 TABLET(S) BY MOUTH DAILY 30 tablet 6 09/24/2015 at Unknown time  . metFORMIN (GLUCOPHAGE-XR) 500 MG 24 hr tablet 2 (TWO) TABLET, ORAL, TWO TIMES DAILY 120 tablet 11 09/24/2015 at Unknown time  . Multiple Vitamin (MULTIVITAMIN WITH MINERALS) TABS tablet Take 1 tablet by mouth daily.   09/24/2015 at Unknown time  . naproxen sodium (ALEVE) 220 MG tablet Take 1 tablet by mouth daily as needed.   prn at prn  . pantoprazole (PROTONIX) 40 MG tablet Take 40 mg by mouth 2 (two) times daily.   09/24/2015 at Unknown time  . potassium chloride (K-DUR) 10 MEQ tablet Take 10 mEq by mouth daily.   09/24/2015 at Unknown time  . prochlorperazine (COMPAZINE) 10 MG tablet TAKE 1 TABLET BY MOUTH EVERY 6 HOURS AS NEEDED 40 tablet 1 09/24/2015 at Unknown time  . sotalol (BETAPACE) 80 MG tablet Take by mouth 2 (two) times daily.   09/24/2015 at Unknown time  . sucralfate (CARAFATE) 1 G tablet Take 1 g by mouth 4 (four) times daily -  with meals and at bedtime.   09/24/2015 at Unknown time  . ticagrelor (BRILINTA) 90 MG TABS tablet Take by mouth 2 (two) times daily.   09/24/2015 at Unknown time   Home medication reconciliation was completed with the patient.   Scheduled Inpatient Medications:   . apixaban  5 mg Oral BID  . aspirin EC  81 mg Oral Daily  . atenolol  25 mg Oral Daily  . canagliflozin  300 mg Oral QAC breakfast  . clopidogrel  75 mg Oral Daily  . fentaNYL  75 mcg  Transdermal Q72H  . furosemide  20 mg Oral BID  . glipiZIDE  10 mg Oral QAC breakfast  . insulin aspart  0-5 Units Subcutaneous QHS  . insulin aspart  0-9 Units Subcutaneous TID WC  . mometasone-formoterol  2 puff Inhalation BID  . multivitamin with minerals  1 tablet Oral Daily  . pantoprazole  40 mg Oral BID  . potassium chloride  10 mEq Oral Daily  . sodium chloride  10-40 mL Intracatheter Q12H  . sotalol  80 mg Oral Daily  . sucralfate  1 g Oral TID WC & HS    Continuous Inpatient Infusions:     PRN Inpatient Medications:  acetaminophen **OR** acetaminophen, alum & mag hydroxide-simeth, chlorproMAZINE, HYDROcodone-acetaminophen, ondansetron **OR**  ondansetron (ZOFRAN) IV, senna-docusate, sodium chloride  Family History: family history includes Aneurysm in his father; Heart attack in his mother; Lung cancer in his other; Stomach cancer in his other; Stroke (age of onset: 41) in his father.  The patient's family history is negative for inflammatory bowel disorders, GI malignancy, or solid organ transplantation.  Social History:   reports that he has never smoked. He has never used smokeless tobacco. He reports that he drinks alcohol. He reports that he does not use illicit drugs. The patient denies ETOH, tobacco, or drug use.   Review of Systems: Constitutional: Weight is stable.  Eyes: No changes in vision. ENT: No oral lesions, sore throat.  GI: see HPI.  Heme/Lymph: No easy bruising.  CV: No chest pain.  GU: No hematuria.  Integumentary: No rashes.  Neuro: No headaches.  Psych: No depression/anxiety.  Endocrine: No heat/cold intolerance.  Allergic/Immunologic: No urticaria.  Resp: No cough, SOB.  Musculoskeletal: No joint swelling.    Physical Examination: BP 112/62 mmHg  Pulse 62  Temp(Src) 98.3 F (36.8 C) (Oral)  Resp 23  Ht 5\' 10"  (1.778 m)  Wt 83.961 kg (185 lb 1.6 oz)  BMI 26.56 kg/m2  SpO2 99% Gen: NAD, alert and oriented x 4 HEENT: PEERLA,  EOMI, Neck: supple, no JVD or thyromegaly Chest: CTA bilaterally, no wheezes, crackles, or other adventitious sounds CV: RRR, no m/g/c/r Abd: soft, NT, ND, +BS in all four quadrants; no HSM, guarding, ridigity, or rebound tenderness Ext: no edema, well perfused with 2+ pulses, Skin: no rash or lesions noted Lymph: no LAD  Data: Lab Results  Component Value Date   WBC 10.8* 09/29/2015   HGB 10.3* 09/29/2015   HCT 30.0* 09/29/2015   MCV 85.5 09/29/2015   PLT 100* 09/29/2015    Recent Labs Lab 09/27/15 0454 09/28/15 0606 09/29/15 0515  HGB 8.0* 9.9* 10.3*   Lab Results  Component Value Date   NA 135 09/28/2015   K 3.9 09/28/2015   CL 108 09/28/2015   CO2 20* 09/28/2015   BUN 31* 09/28/2015   CREATININE 1.77* 09/29/2015   GLU 113 12/22/2014   Lab Results  Component Value Date   ALT 17 09/18/2015   AST 20 09/18/2015   ALKPHOS 82 09/18/2015   BILITOT 0.4 09/18/2015    Recent Labs Lab 09/27/15 0454 09/29/15 0817  APTT 60*  --   INR  --  1.37   Assessment/Plan: Louis Alexander is a 74 y.o. male with multiple medical problems, least of which is heme positive stool.  Recommendations: As long as there is no active GI bleeding with drop in hgb, then no intervention necessary. Pt can f/u in GI later if bleeding persists on anticoagulation. Will sign off. Thank you for the consult. Please call with questions or concerns.  Nyra Anspaugh, Lupita Dawn, MD

## 2015-09-29 NOTE — Progress Notes (Signed)
Dr. Clayton Bibles rounding on patient. RN inquired about giving scheduled atenolol and sotalol this morning with BP 110s/60s and fentanyl patch placed today. Per Dr. Clayton Bibles, hold atenolol and give 40 sotalol instead of 80.

## 2015-09-29 NOTE — Progress Notes (Addendum)
Pt ambulated with RN and walker for balance around nurses station x3 (approximately 1,553ft). Tolerated well, no SOB, dizziness or other complaints. Per pt and night RN, patient ambulated twice during the night, with 3 laps around nurses station each time.

## 2015-09-29 NOTE — Progress Notes (Signed)
Per NT, AM CBG 189. Glucometer malfunctioning and not pairing with CHL results.

## 2015-09-29 NOTE — Progress Notes (Signed)
[facility]  Date of admission:  09/25/2015  Inpatient day:  09/29/2015  Consulting physician: Neoma Laming, MD   Reason for Consultation:  Thrombocytopenia.  Chief Complaint: Louis Alexander is a 74 y.o. male with metastatic urothelial carcinoma who was admitted with weakness and hematuria.  HPI:  Patient has stage IV urothelial carcinoma currently day 11 status post cycle #3 cisplatin and gemcitabine.  Patient with documented pulmonary embolism initially on Eliquis then developed hematuria.  IVC filter placed.  Patient transiently on heparin, now back on Eliquis.  Platelet count was 395,000 on 11/1, 157,000 on 11/9, 128,000 on 11/9, 109,000 on 11/10, 94,000 on 11/11, and 100,000 today.  Patient has had thrombocytopenia associated with prior cycles of chemotherapy (36,000 on 05/16/2015 and 112,000 on 08/07/2015).  Currently, patient denies any bleeding.  He denies any shortness of breath or hemoptysis.  Past Medical History  Diagnosis Date  . Abnormal EKG   . Coronary artery disease   . Diabetes mellitus without complication ([place])   . Obesity   . Hyperlipidemia   . Hypertension   . Myocardial infarction ([place]) 2008 and 2012    x 2  . Sleep apnea     could not tolerate cpap mask  . Arthritis     oa  . Cancer [facility])     bladder and prostate  . History of radiation therapy 2012  . History of chemotherapy 2014  . Bladder cancer ([place])   . History of chicken pox     Past Surgical History  Procedure Laterality Date  . Cardiac catheterization  12-06-2003  . Lad stent  2000 x1 stent, 2008 x 1 stent, 2012 x 1 stent  . Hernia repair  yrs ago  . Appendectomy  many yrs ago  . Rotator cuff repair Left yrs ago  . Seed implant to prostate with radiation  2012  . Protatectomy and urostomy  2014  . Total hip arthroplasty Right 12/27/2013    Procedure: RIGHT TOTAL HIP ARTHROPLASTY ANTERIOR APPROACH;  Surgeon: Mauri Pole, MD;  [facility];  Service:  Orthopedics;  Laterality: Right;  . Cardiac catheterization N/A 06/04/2015    Procedure: Left Heart Cath and Coronary Angiography;  Surgeon: Corey Skains, MD;  Location: [place] [facility] LAB;  Service: Cardiovascular;  Laterality: N/A;  . Cardiac catheterization N/A 06/04/2015    Procedure: Coronary Stent Intervention;  Surgeon: Wellington Hampshire, MD;  Location: [place] CV LAB;  Service: Cardiovascular;  Laterality: N/A;    Family History  Problem Relation Age of Onset  . Stroke Father 26  . Aneurysm Father   . Heart attack Mother   . Lung cancer Other   . Stomach cancer Other     Social History:  reports that he has never smoked. He has never used smokeless tobacco. He reports that he drinks alcohol. He reports that he does not use illicit drugs.  He is alone today.  Allergies:  Allergies  Allergen Reactions  . Celebrex [Celecoxib]     Hematuria Other reaction(s): Bleeding Bleeding in bladder  . Effient [Prasugrel] Other (See Comments)    blood clots, blood in urine Other reaction(s): Bleeding Bleeding in bladder    Medications Prior to Admission  Medication Sig Dispense Refill  . apixaban (ELIQUIS) 5 MG TABS tablet Take 5 mg by mouth 2 (two) times daily.    [name] aspirin EC 81 MG tablet Take 81 mg by mouth daily.    [name] atenolol (TENORMIN) 25 MG  tablet Take 25 mg by mouth daily.    . canagliflozin (INVOKANA) 300 MG TABS tablet Take 300 mg by mouth daily before breakfast. 30 tablet 3  . Coenzyme Q10 10 MG capsule Take 10 mg by mouth.    . fentaNYL (DURAGESIC - DOSED MCG/HR) 75 MCG/HR Place 1 patch (75 mcg total) onto the skin every 3 (three) days. 10 patch 0  . Fluticasone Furoate-Vilanterol (BREO ELLIPTA) 100-25 MCG/INH AEPB Inhale 1 Inhaler into the lungs daily.    . furosemide (LASIX) 20 MG tablet Take 20 mg by mouth 2 (two) times daily.    [name] glipiZIDE (GLUCOTROL) 10 MG tablet TAKE 1 TABLET(S) BY MOUTH DAILY 30 tablet 6  . metFORMIN (GLUCOPHAGE-XR) 500 MG 24 hr tablet 2  (TWO) TABLET, ORAL, TWO TIMES DAILY 120 tablet 11  . Multiple Vitamin (MULTIVITAMIN WITH MINERALS) TABS tablet Take 1 tablet by mouth daily.    . naproxen sodium (ALEVE) 220 MG tablet Take 1 tablet by mouth daily as needed.    . pantoprazole (PROTONIX) 40 MG tablet Take 40 mg by mouth 2 (two) times daily.    . potassium chloride (K-DUR) 10 MEQ tablet Take 10 mEq by mouth daily.    . prochlorperazine (COMPAZINE) 10 MG tablet TAKE 1 TABLET BY MOUTH EVERY 6 HOURS AS NEEDED 40 tablet 1  . sotalol (BETAPACE) 80 MG tablet Take by mouth 2 (two) times daily.    . sucralfate (CARAFATE) 1 G tablet Take 1 g by mouth 4 (four) times daily -  with meals and at bedtime.    . ticagrelor (BRILINTA) 90 MG TABS tablet Take by mouth 2 (two) times daily.      Review of Systems: GENERAL:  Feels fine.  No fevers, sweats or weight loss. PERFORMANCE STATUS (ECOG):  1 HEENT:  No visual changes, runny nose, sore throat, mouth sores or tenderness. Lungs: No shortness of breath or cough.  No hemoptysis. Cardiac:  No chest pain, palpitations, orthopnea, or PND. GI:  No nausea, vomiting, diarrhea, constipation, melena or hematochezia. GU:  No urgency, frequency, dysuria, or hematuria. Musculoskeletal:  No back pain.  No joint pain.  No muscle tenderness. Extremities:  No pain or swelling. Skin:  No rashes or skin changes. Neuro:  No headache, numbness or weakness, balance or coordination issues. Endocrine:  No diabetes, thyroid issues, hot flashes or night sweats. Psych:  No mood changes, depression or anxiety. Pain:  No focal pain. Review of systems:  All other systems reviewed and found to be negative.  Physical Exam:  Blood pressure 117/75, pulse 81, temperature 98.2 F (36.8 C), temperature source Oral, resp. rate 18, height 5' 10"  (1.778 m), weight 185 lb 1.6 oz (83.961 kg), SpO2 100 %.  GENERAL:  Well developed, well nourished, sitting comfortably on the medical unit in no acute distress. MENTAL STATUS:   Alert and oriented to person, place and time. HEAD:  Thin gray hair.  Normocephalic, atraumatic, face symmetric, no Cushingoid features. EYES:  Hazel eyes.  Pupils equal round and reactive to light and accomodation.  No conjunctivitis or scleral icterus. ENT:  Posterior pharynx thrush.  Tongue normal. Mucous membranes moist.  RESPIRATORY:  Clear to auscultation without rales, wheezes or rhonchi. CARDIOVASCULAR:  Regular rate and rhythm without murmur, rub or gallop. ABDOMEN:  Soft, non-tender, with active bowel sounds, and no hepatosplenomegaly.  No masses. SKIN:  No rashes, ulcers or lesions. EXTREMITIES: Mild left lower extremity swelling.  No skin discoloration or tenderness.  No palpable cords. LYMPH  NODES: No palpable cervical, supraclavicular, axillary or inguinal adenopathy  NEUROLOGICAL: Unremarkable. PSYCH:  Appropriate.  Results for orders placed or performed during the hospital encounter of 09/25/15 (from the past 48 hour(s))  Glucose, capillary     Status: Abnormal   Collection Time: 09/27/15  4:27 PM  Result Value Ref Range   Glucose-Capillary 165 (H) 65 - 99 mg/dL   Comment 1 Notify RN   Glucose, capillary     Status: Abnormal   Collection Time: 09/27/15  8:13 PM  Result Value Ref Range   Glucose-Capillary 156 (H) 65 - 99 mg/dL  Basic metabolic panel     Status: Abnormal   Collection Time: 09/28/15  6:06 AM  Result Value Ref Range   Sodium 135 135 - 145 mmol/L   Potassium 3.9 3.5 - 5.1 mmol/L   Chloride 108 101 - 111 mmol/L   CO2 20 (L) 22 - 32 mmol/L   Glucose, Bld 136 (H) 65 - 99 mg/dL   BUN 31 (H) 6 - 20 mg/dL   Creatinine, Ser 1.78 (H) 0.61 - 1.24 mg/dL   Calcium 8.6 (L) 8.9 - 10.3 mg/dL   GFR calc non Af Amer 36 (L) >60 mL/min   GFR calc Af Amer 42 (L) >60 mL/min    Comment: (NOTE) The eGFR has been calculated using the CKD EPI equation. This calculation has not been validated in all clinical situations. eGFR's persistently <60 mL/min signify possible Chronic  Kidney Disease.    Anion gap 7 5 - 15  CBC     Status: Abnormal   Collection Time: 09/28/15  6:06 AM  Result Value Ref Range   WBC 9.9 3.8 - 10.6 K/uL   RBC 3.41 (L) 4.40 - 5.90 MIL/uL   Hemoglobin 9.9 (L) 13.0 - 18.0 g/dL   HCT 29.1 (L) 40.0 - 52.0 %   MCV 85.2 80.0 - 100.0 fL   MCH 29.1 26.0 - 34.0 pg   MCHC 34.1 32.0 - 36.0 g/dL   RDW 17.7 (H) 11.5 - 14.5 %   Platelets 94 (L) 150 - 440 K/uL  Magnesium     Status: None   Collection Time: 09/28/15  6:06 AM  Result Value Ref Range   Magnesium 1.8 1.7 - 2.4 mg/dL  Glucose, capillary     Status: Abnormal   Collection Time: 09/28/15  7:23 AM  Result Value Ref Range   Glucose-Capillary 118 (H) 65 - 99 mg/dL   Comment 1 Notify RN   Glucose, capillary     Status: Abnormal   Collection Time: 09/28/15 11:53 AM  Result Value Ref Range   Glucose-Capillary 220 (H) 65 - 99 mg/dL  Glucose, capillary     Status: Abnormal   Collection Time: 09/28/15  4:24 PM  Result Value Ref Range   Glucose-Capillary 156 (H) 65 - 99 mg/dL  Glucose, capillary     Status: Abnormal   Collection Time: 09/28/15  9:45 PM  Result Value Ref Range   Glucose-Capillary 167 (H) 65 - 99 mg/dL  Creatinine, serum     Status: Abnormal   Collection Time: 09/29/15  5:15 AM  Result Value Ref Range   Creatinine, Ser 1.77 (H) 0.61 - 1.24 mg/dL   GFR calc non Af Amer 36 (L) >60 mL/min   GFR calc Af Amer 42 (L) >60 mL/min    Comment: (NOTE) The eGFR has been calculated using the CKD EPI equation. This calculation has not been validated in all clinical situations. eGFR's persistently <60 mL/min  signify possible Chronic Kidney Disease.   CBC     Status: Abnormal   Collection Time: 09/29/15  5:15 AM  Result Value Ref Range   WBC 10.8 (H) 3.8 - 10.6 K/uL   RBC 3.51 (L) 4.40 - 5.90 MIL/uL   Hemoglobin 10.3 (L) 13.0 - 18.0 g/dL   HCT 30.0 (L) 40.0 - 52.0 %   MCV 85.5 80.0 - 100.0 fL   MCH 29.3 26.0 - 34.0 pg   MCHC 34.3 32.0 - 36.0 g/dL   RDW 17.8 (H) 11.5 - 14.5 %    Platelets 100 (L) 150 - 440 K/uL  Protime-INR     Status: Abnormal   Collection Time: 09/29/15  8:17 AM  Result Value Ref Range   Prothrombin Time 17.0 (H) 11.4 - 15.0 seconds   INR 1.37   Glucose, capillary     Status: Abnormal   Collection Time: 09/29/15 11:36 AM  Result Value Ref Range   Glucose-Capillary 215 (H) 65 - 99 mg/dL   Comment 1 Notify RN    No results found.  Assessment:  The patient is a 74 y.o.  gentleman with metastatic urothelial carcinoma currently day 11 status post cycle #3 cisplatin and gemcitabine.  Patient has mild thrombocytopenia likely secondary to nadir counts (thrombocytopenia with prior cycles) and consumption due to recent hematuria.  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  He has pulmonary emboli and extensive mixed occlusive and non occlusive DVT involving the bilateral common, superficial and popliteal veins bilaterally.  He was initially on Eliquis then developed hematuria.  IVC filter placed. He is back on Eliquis.  Symptomatically, he denies any bleeding.  He denies any shortness of breath or hemoptysis.  Plan:   1.  Monitor CBC with diff daily.   2.  Will request HIT assay and serotonin release assay.  3.  Continue Eliquis.   Thank you for allowing me to participate in Louis Alexander 's care.  I will follow him closely with you while hospitalized.  Lequita Asal, MD  09/29/2015, 1:24 PM

## 2015-09-29 NOTE — Progress Notes (Signed)
   SUBJECTIVE: Pt states he is doing well this morning, less SOB, no hematuria.    Filed Vitals:   09/28/15 1440 09/28/15 2020 09/29/15 0457 09/29/15 0814  BP:  106/60 116/70 112/62  Pulse: 62 65 65 62  Temp:  98.6 F (37 C) 98.3 F (36.8 C)   TempSrc:  Oral    Resp:  19 23   Height:      Weight:      SpO2: 98% 98% 99%     Intake/Output Summary (Last 24 hours) at 09/29/15 1029 Last data filed at 09/29/15 1016  Gross per 24 hour  Intake    740 ml  Output   2020 ml  Net  -1280 ml    LABS: Basic Metabolic Panel:  Recent Labs  09/27/15 0454 09/28/15 0606 09/29/15 0515  NA 133* 135  --   K 4.4 3.9  --   CL 105 108  --   CO2 20* 20*  --   GLUCOSE 156* 136*  --   BUN 33* 31*  --   CREATININE 2.03* 1.78* 1.77*  CALCIUM 8.8* 8.6*  --   MG  --  1.8  --    Liver Function Tests: No results for input(s): AST, ALT, ALKPHOS, BILITOT, PROT, ALBUMIN in the last 72 hours. No results for input(s): LIPASE, AMYLASE in the last 72 hours. CBC:  Recent Labs  09/28/15 0606 09/29/15 0515  WBC 9.9 10.8*  HGB 9.9* 10.3*  HCT 29.1* 30.0*  MCV 85.2 85.5  PLT 94* 100*   Cardiac Enzymes: No results for input(s): CKTOTAL, CKMB, CKMBINDEX, TROPONINI in the last 72 hours. BNP: Invalid input(s): POCBNP D-Dimer: No results for input(s): DDIMER in the last 72 hours. Hemoglobin A1C: No results for input(s): HGBA1C in the last 72 hours. Fasting Lipid Panel: No results for input(s): CHOL, HDL, LDLCALC, TRIG, CHOLHDL, LDLDIRECT in the last 72 hours. Thyroid Function Tests: No results for input(s): TSH, T4TOTAL, T3FREE, THYROIDAB in the last 72 hours.  Invalid input(s): FREET3 Anemia Panel: No results for input(s): VITAMINB12, FOLATE, FERRITIN, TIBC, IRON, RETICCTPCT in the last 72 hours.   PHYSICAL EXAM General: Well developed, well nourished, in no acute distress HEENT:  Normocephalic and atramatic Neck:  No JVD.  Lungs: Clear bilaterally to auscultation and  percussion. Heart: HRRR . Normal S1 and S2 without gallops or murmurs.  Abdomen: Bowel sounds are positive, abdomen soft and non-tender  Msk:  Back normal, normal gait. Normal strength and tone for age. Extremities: No clubbing, cyanosis or edema.   Neuro: Alert and oriented X 3. Psych:  Good affect, responds appropriately  TELEMETRY: Reviewed telemetry pt in NSR  ASSESSMENT AND PLAN: 1. Pulmonary embolism: s/p IVC filter, continue eliquis 5mg  BID   2. CAD s/p PCI in July: continue asa and plavix  3. Guaiac positive stools: GI referral today  4. 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 Neoma Laming, who agrees with above findings.   [facility] [name], [place]  09/29/2015 10:29 AM

## 2015-09-30 DIAGNOSIS — R310 Gross hematuria: Secondary | ICD-10-CM

## 2015-09-30 LAB — GLUCOSE, CAPILLARY
Glucose-Capillary: 152 mg/dL — ABNORMAL HIGH (ref 65–99)
Glucose-Capillary: 245 mg/dL — ABNORMAL HIGH (ref 65–99)
Glucose-Capillary: 210 mg/dL — ABNORMAL HIGH (ref 65–99)

## 2015-09-30 LAB — CBC
HEMATOCRIT: 33 % — AB (ref 40.0–52.0)
Hemoglobin: 10.7 g/dL — ABNORMAL LOW (ref 13.0–18.0)
MCH: 27.8 pg (ref 26.0–34.0)
MCHC: 32.5 g/dL (ref 32.0–36.0)
MCV: 85.5 fL (ref 80.0–100.0)
PLATELETS: 144 10*3/uL — AB (ref 150–440)
RBC: 3.86 MIL/uL — ABNORMAL LOW (ref 4.40–5.90)
RDW: 17.6 % — AB (ref 11.5–14.5)
WBC: 14.5 10*3/uL — AB (ref 3.8–10.6)

## 2015-09-30 LAB — BASIC METABOLIC PANEL
ANION GAP: 6 (ref 5–15)
BUN: 28 mg/dL — ABNORMAL HIGH (ref 6–20)
CHLORIDE: 105 mmol/L (ref 101–111)
CO2: 19 mmol/L — AB (ref 22–32)
Calcium: 8.9 mg/dL (ref 8.9–10.3)
Creatinine, Ser: 1.81 mg/dL — ABNORMAL HIGH (ref 0.61–1.24)
GFR calc non Af Amer: 35 mL/min — ABNORMAL LOW (ref >60)
GFR, EST AFRICAN AMERICAN: 41 mL/min — AB (ref >60)
GLUCOSE: 239 mg/dL — AB (ref 65–99)
Potassium: 4.5 mmol/L (ref 3.5–5.1)
Sodium: 130 mmol/L — ABNORMAL LOW (ref 135–145)

## 2015-09-30 NOTE — Progress Notes (Signed)
Dr. Clayton Bibles updated with EKG results, HR went up to 180 while ambulating, pt is reporting significant anxiety regarding his wife's readmission to hospital. MD also updated with O2 results while ambulating, pt qualifies for O2 which MD will order for patient at discharge. MD attributes HR and SpO2 to PE.

## 2015-09-30 NOTE — Progress Notes (Signed)
Pt remains A &O x4. Denies pain. Relief obtained from PRN for hiccups. Pt ambulated in hall with assist of one. R chest POC intact, drsg dry, flushed per orders. + blood return. VSS afebrile.

## 2015-09-30 NOTE — Discharge Summary (Signed)
[facility] [place] NAME: Louis Alexander    MR#:  [id number]  DATE OF BIRTH:  May 08, 1941  DATE OF ADMISSION:  09/25/2015 ADMITTING PHYSICIAN: Louis Costa, MD  DATE OF DISCHARGE: No discharge date for patient encounter.  PRIMARY CARE PHYSICIAN: Louis Huh, MD     ADMISSION DIAGNOSIS:  Weakness [R53.1] Anemia, unspecified anemia type [D64.9] Other acute pulmonary embolism without acute cor pulmonale (HCC) [I26.99]  DISCHARGE DIAGNOSIS:  Principal Problem:   Hematuria Active Problems:   S/P IVC filter   Acute posthemorrhagic anemia   Blood transfusion during current hospitalization   Thrombocytopenia (HCC)   Pulmonary emboli (HCC)   Pulmonary embolism (HCC)   DVT (deep venous thrombosis) (HCC)   Guaiac positive stools   Prostate cancer metastatic to multiple sites ([place])   Hyponatremia   Acute renal failure ([place])   SECONDARY DIAGNOSIS:   Past Medical History  Diagnosis Date  . Abnormal EKG   . Coronary artery disease   . Diabetes mellitus without complication ([place])   . Obesity   . Hyperlipidemia   . Hypertension   . Myocardial infarction ([place]) 2008 and 2012    x 2  . Sleep apnea     could not tolerate cpap mask  . Arthritis     oa  . Cancer [facility])     bladder and prostate  . History of radiation therapy 2012  . History of chemotherapy 2014  . Bladder cancer ([place])   . History of chicken pox     .pro HOSPITAL COURSE:   Patient is 74 year old Caucasian male with history of metastatic prostate cancer, type 2 diabetes mellitus, coronary artery disease and a recent diagnosis of pulmonary embolism for which she was started on Eliquis comes to the hospital with complaints of hematuria and weakness. On arrival to the hospital patient was noted to have renal insufficiency with creatinine of 1.92 and his hemoglobin level was 8.2. He was not hypotensive or tachycardic, but because of diagnosis of pulmonary embolism  and anemia. He was transfused 2 units of packed red blood cells after which she is hemoglobin level improved to around 10. Patient was seen by multiple physicians including Dr. Raul Del, cardiologist Dr. Humphrey Rolls, Dr. Grayland Ormond, gastroenterologist Dr. Candace Cruise, Dr. Mike Gip, Dr. Jeffie Pollock. Patient underwent IVC filter placement on 09/26/2015. Patient was noted to have intermittent tachycardia episodes, although remained in sinus rhythm. O2 sats were documented as low as 91% Discussion by problem 1. Pulmonary emboli in the right main pulmonary artery and segmental branches on the left. 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 Continue Plavix, aspirin and resumed eliquis yesterday at non loading dose, hemoglobin and platelet Count remains stable. Likely discharge home today. Getting CBC  2. Hematuria: Resolved  off Brilinta and high-dose of Eliquis, follow-up urology as outpatient in 2 weeks after discharge.  3. Guaiac positive stool: No active bleeding, stable hemoglobin. Continue pantoprazole 40 mg by mouth twice a day, no intervention at present by gastroenterologist, but if hemoglobin trends down, the patient may benefit from gastroenterologist evaluation in the future  4. History of metastatic prostate cancer: Follow-up of [name] as outpatient.   5. Acute on chronic anemia with urinary blood loss: Status post PRBC transfusion 2 units, hemoglobin level has improved and remained stable . Follow-up hemoglobin as outpatient. Repeat CBC stat  6. Type 2 diabetes without complication: on sliding scale insulin, glipizide and ADA diet.  7. CAD with previous stents: Continue aspirin, Plavix, simvastatin and sotalol, atenolol  8 Acute on chronic renal failure due to ATN. Improved some, the patient has Foley catheter , continue follow-up kidney function as outpatient., Getting BMP now   9Hyponatremia. Improved..  10 Thrombocytopenia. Improved, Follow-up CBC as outpatient.  11. Generalized weakness. Patient will be given  physical therapy nurse and home health aide upon discharge   DISCHARGE CONDITIONS:   Stable  CONSULTS OBTAINED:  Treatment Team:  Evlyn Kanner, NP Sela Hua, PA-C Lloyd Huger, MD Katha Cabal, MD Dionisio David, MD Lequita Asal, MD  DRUG ALLERGIES:   Allergies  Allergen Reactions  . Celebrex [Celecoxib]     Hematuria Other reaction(s): Bleeding Bleeding in bladder  . Effient [Prasugrel] Other (See Comments)    blood clots, blood in urine Other reaction(s): Bleeding Bleeding in bladder    DISCHARGE MEDICATIONS:   Current Discharge Medication List    START taking these medications   Details  chlorproMAZINE (THORAZINE) 10 MG tablet Take 1 tablet (10 mg total) by mouth 3 (three) times daily as needed for hiccoughs. Qty: 60 tablet, Refills: 3    clopidogrel (PLAVIX) 75 MG tablet Take 1 tablet (75 mg total) by mouth daily. Qty: 30 tablet, Refills: 6    mometasone-formoterol (DULERA) 100-5 MCG/ACT AERO Inhale 2 puffs into the lungs 2 (two) times daily. Qty: 1 Inhaler, Refills: 3      CONTINUE these medications which have NOT CHANGED   Details  apixaban (ELIQUIS) 5 MG TABS tablet Take 5 mg by mouth 2 (two) times daily.    aspirin EC 81 MG tablet Take 81 mg by mouth daily.    atenolol (TENORMIN) 25 MG tablet Take 25 mg by mouth daily.    canagliflozin (INVOKANA) 300 MG TABS tablet Take 300 mg by mouth daily before breakfast. Qty: 30 tablet, Refills: 3   Associated Diagnoses: Uncontrolled type 2 diabetes mellitus without complication, without long-term current use of insulin (HCC)    Coenzyme Q10 10 MG capsule Take 10 mg by mouth.    fentaNYL (DURAGESIC - DOSED MCG/HR) 75 MCG/HR Place 1 patch (75 mcg total) onto the skin every 3 (three) days. Qty: 10 patch, Refills: 0    Fluticasone Furoate-Vilanterol (BREO ELLIPTA) 100-25 MCG/INH AEPB Inhale 1 Inhaler into the lungs daily.    furosemide (LASIX) 20 MG tablet Take 20 mg by mouth 2  (two) times daily.    glipiZIDE (GLUCOTROL) 10 MG tablet TAKE 1 TABLET(S) BY MOUTH DAILY Qty: 30 tablet, Refills: 6    metFORMIN (GLUCOPHAGE-XR) 500 MG 24 hr tablet 2 (TWO) TABLET, ORAL, TWO TIMES DAILY Qty: 120 tablet, Refills: 11    Multiple Vitamin (MULTIVITAMIN WITH MINERALS) TABS tablet Take 1 tablet by mouth daily.    pantoprazole (PROTONIX) 40 MG tablet Take 40 mg by mouth 2 (two) times daily.    potassium chloride (K-DUR) 10 MEQ tablet Take 10 mEq by mouth daily.    prochlorperazine (COMPAZINE) 10 MG tablet TAKE 1 TABLET BY MOUTH EVERY 6 HOURS AS NEEDED Qty: 40 tablet, Refills: 1    sotalol (BETAPACE) 80 MG tablet Take by mouth 2 (two) times daily.   Associated Diagnoses: Atrial fibrillation, unspecified type (HCC)    sucralfate (CARAFATE) 1 G tablet Take 1 g by mouth 4 (four) times daily -  with meals and at bedtime.      STOP taking these medications     naproxen sodium (ALEVE) 220 MG tablet      ticagrelor (BRILINTA) 90 MG TABS tablet  DISCHARGE INSTRUCTIONS:    Patient is to follow-up with Dr. Caryn Section, Dr. Humphrey Rolls, Dr. Grayland Ormond, Dr. Candace Cruise as needed. Please follow kidney function closely as well as CBC  If you experience worsening of your admission symptoms, develop shortness of breath, life threatening emergency, suicidal or homicidal thoughts you must seek medical attention immediately by calling 911 or calling your MD immediately  if symptoms less severe.  You Must read complete instructions/literature along with all the possible adverse reactions/side effects for all the Medicines you take and that have been prescribed to you. Take any new Medicines after you have completely understood and accept all the possible adverse reactions/side effects.   Please note  You were cared for by a hospitalist during your hospital stay. If you have any questions about your discharge medications or the care you received while you were in the hospital after you are  discharged, you can call the unit and asked to speak with the hospitalist on call if the hospitalist that took care of you is not available. Once you are discharged, your primary care physician will handle any further medical issues. Please note that NO REFILLS for any discharge medications will be authorized once you are discharged, as it is imperative that you return to your primary care physician (or establish a relationship with a primary care physician if you do not have one) for your aftercare needs so that they can reassess your need for medications and monitor your lab values.    Today   CHIEF COMPLAINT:   Chief Complaint  Patient presents with  . Shortness of Breath    HISTORY OF PRESENT ILLNESS:  Louis Alexander  is a 74 y.o. male with a known history of  metastatic prostate cancer, type 2 diabetes mellitus, coronary artery disease and a recent diagnosis of pulmonary embolism for which she was started on Eliquis comes to the hospital with complaints of hematuria and weakness. On arrival to the hospital patient was noted to have renal insufficiency with creatinine of 1.92 and his hemoglobin level was 8.2. He was not hypotensive or tachycardic, but because of diagnosis of pulmonary embolism and anemia. He was transfused 2 units of packed red blood cells after which she is hemoglobin level improved to around 10. Patient was seen by multiple physicians including Dr. Raul Del, cardiologist Dr. Humphrey Rolls, Dr. Grayland Ormond, gastroenterologist Dr. Candace Cruise, Dr. Mike Gip, Dr. Jeffie Pollock. Patient underwent IVC filter placement on 09/26/2015. Patient was noted to have intermittent tachycardia episodes, although remained in sinus rhythm. O2 sats were documented as low as 91% Discussion by problem 1. Pulmonary emboli in the right main pulmonary artery and segmental branches on the left. 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 Continue Plavix, aspirin and resumed eliquis yesterday at non loading dose, hemoglobin and platelet Count  remains stable. Likely discharge home today. Getting CBC  2. Hematuria: Resolved  off Brilinta and high-dose of Eliquis, follow-up urology as outpatient in 2 weeks after discharge.  3. Guaiac positive stool: No active bleeding, stable hemoglobin. Continue pantoprazole 40 mg by mouth twice a day, no intervention at present by gastroenterologist, but if hemoglobin trends down, the patient may benefit from gastroenterologist evaluation in the future  4. History of metastatic prostate cancer: Follow-up of [name] as outpatient.   5. Acute on chronic anemia with urinary blood loss: Status post PRBC transfusion 2 units, hemoglobin level has improved and remained stable . Follow-up hemoglobin as outpatient. Repeat CBC stat  6. Type 2 diabetes without complication: on sliding scale insulin, glipizide and ADA  diet.  7. CAD with previous stents: Continue aspirin, Plavix, simvastatin and sotalol, atenolol  8 Acute on chronic renal failure due to ATN. Improved some, the patient has Foley catheter , continue follow-up kidney function as outpatient., Getting BMP now   9Hyponatremia. Improved..  10 Thrombocytopenia. Improved, Follow-up CBC as outpatient.  11. Generalized weakness. Patient will be given physical therapy nurse and home health aide upon discharge     VITAL SIGNS:  Blood pressure 108/76, pulse 100, temperature 97.5 F (36.4 C), temperature source Oral, resp. rate 14, height 5\' 10"  (1.778 m), weight 83.961 kg (185 lb 1.6 oz), SpO2 91 %.  I/O:   Intake/Output Summary (Last 24 hours) at 09/30/15 1408 Last data filed at 09/30/15 1348  Gross per 24 hour  Intake   1200 ml  Output   2700 ml  Net  -1500 ml    PHYSICAL EXAMINATION:  GENERAL:  74 y.o.-year-old patient lying in the bed with no acute distress.  EYES: Pupils equal, round, reactive to light and accommodation. No scleral icterus. Extraocular muscles intact.  HEENT: Head atraumatic, normocephalic. Oropharynx and  nasopharynx clear.  NECK:  Supple, no jugular venous distention. No thyroid enlargement, no tenderness.  LUNGS: Normal breath sounds bilaterally, no wheezing, rales,rhonchi or crepitation. No use of accessory muscles of respiration.  CARDIOVASCULAR: S1, S2 normal. No murmurs, rubs, or gallops.  ABDOMEN: Soft, non-tender, non-distended. Bowel sounds present. No organomegaly or mass.  EXTREMITIES: No pedal edema, cyanosis, or clubbing.  NEUROLOGIC: Cranial nerves II through XII are intact. Muscle strength 5/5 in all extremities. Sensation intact. Gait not checked.  PSYCHIATRIC: The patient is alert and oriented x 3.  SKIN: No obvious rash, lesion, or ulcer.   DATA REVIEW:   CBC  Recent Labs Lab 09/29/15 1700  WBC 10.9*  HGB 10.2*  HCT 30.9*  PLT 112*    Chemistries   Recent Labs Lab 09/28/15 0606 09/29/15 0515  NA 135  --   K 3.9  --   CL 108  --   CO2 20*  --   GLUCOSE 136*  --   BUN 31*  --   CREATININE 1.78* 1.77*  CALCIUM 8.6*  --   MG 1.8  --     Cardiac Enzymes No results for input(s): TROPONINI in the last 168 hours.  Microbiology Results  Results for orders placed or performed during the hospital encounter of 12/21/13  Surgical pcr screen     Status: None   Collection Time: 12/21/13  8:21 AM  Result Value Ref Range Status   MRSA, PCR NEGATIVE NEGATIVE Final   Staphylococcus aureus NEGATIVE NEGATIVE Final    Comment:        The Xpert SA Assay (FDA approved for NASAL specimens in patients over 62 years of age), is one component of a comprehensive surveillance program.  Test performance has been validated by [employer] for patients greater than or equal to 53 year old. It is not intended to diagnose infection nor to guide or monitor treatment.  Urine culture     Status: None   Collection Time: 12/21/13  8:21 AM  Result Value Ref Range Status   Specimen Description URINE, CLEAN CATCH  Final   Special Requests NONE  Final   Culture  Setup Time    Final    12/21/2013 13:30 Performed at [facility]   Final    >=100,000 COLONIES/ML Performed at [facility]   Final  PSEUDOMONAS AERUGINOSA KLEBSIELLA PNEUMONIAE Performed at [facility]   Report Status 12/24/2013 FINAL  Final   Organism ID, Bacteria PSEUDOMONAS AERUGINOSA  Final   Organism ID, Bacteria KLEBSIELLA PNEUMONIAE  Final      Susceptibility   Klebsiella pneumoniae - MIC*    AMPICILLIN >=32 RESISTANT Resistant     CEFAZOLIN <=4 SENSITIVE Sensitive     CEFTRIAXONE <=1 SENSITIVE Sensitive     CIPROFLOXACIN <=0.25 SENSITIVE Sensitive     GENTAMICIN <=1 SENSITIVE Sensitive     LEVOFLOXACIN <=0.12 SENSITIVE Sensitive     NITROFURANTOIN 128 RESISTANT Resistant     TOBRAMYCIN <=1 SENSITIVE Sensitive     TRIMETH/SULFA <=20 SENSITIVE Sensitive     PIP/TAZO <=4 SENSITIVE Sensitive     * KLEBSIELLA PNEUMONIAE   Pseudomonas aeruginosa - MIC*    CEFEPIME 2 SENSITIVE Sensitive     CEFTAZIDIME 4 SENSITIVE Sensitive     CIPROFLOXACIN <=0.25 SENSITIVE Sensitive     GENTAMICIN <=1 SENSITIVE Sensitive     IMIPENEM 2 SENSITIVE Sensitive     PIP/TAZO 8 SENSITIVE Sensitive     TOBRAMYCIN* <=1 SENSITIVE Sensitive      * SET UP TIME:  [id number]    * PSEUDOMONAS AERUGINOSA    RADIOLOGY:  No results found.  EKG:   Orders placed or performed during the hospital encounter of 09/25/15  . EKG 12-Lead  . EKG 12-Lead  . ED EKG  . ED EKG  . EKG 12-Lead  . EKG 12-Lead      Management plans discussed with the patient, family and they are in agreement.  CODE STATUS:     Code Status Orders        Start     Ordered   09/25/15 1614  Full code   Continuous     09/25/15 1613    Advance Directive Documentation        Most Recent Value   Type of Advance Directive  Healthcare Power of Attorney   Pre-existing out of facility DNR order (yellow form or pink MOST form)     "MOST" Form in Place?        TOTAL TIME TAKING  CARE OF THIS PATIENT: 40 minutes.    Theodoro Grist M.D on 09/30/2015 at 2:08 PM  Between 7am to 6pm - Pager - [phone]  After 6pm go to [email or web address] - password [email or web address] [facility]  Office  [phone]  CC: Primary care physician; Louis Huh, MD

## 2015-09-30 NOTE — Progress Notes (Signed)
Patient ID: BONNER JANI, male   DOB: 03-17-1941, 74 y.o.   MRN: [id number] 5 Days Post-Op  Subjective: Mr. Coady is doing well with cessation of Brilinta.  His urine has cleared.  ROS:  Review of Systems  Genitourinary: Negative for hematuria and flank pain.    Anti-infectives: Anti-infectives    None      Current Facility-Administered Medications  Medication Dose Route Frequency Provider Last Rate Last Dose  . acetaminophen (TYLENOL) tablet 650 mg  650 mg Oral Q6H PRN Bettey Costa, MD       Or  . acetaminophen (TYLENOL) suppository 650 mg  650 mg Rectal Q6H PRN Bettey Costa, MD      . alum & mag hydroxide-simeth (MAALOX/MYLANTA) 200-200-20 MG/5ML suspension 30 mL  30 mL Oral Q6H PRN Bettey Costa, MD      . apixaban (ELIQUIS) tablet 5 mg  5 mg Oral BID Barnabas Harries, PA-C   5 mg at 09/30/15 [id number]  . aspirin EC tablet 81 mg  81 mg Oral Daily Bettey Costa, MD   81 mg at 09/30/15 0814  . atenolol (TENORMIN) tablet 25 mg  25 mg Oral Daily Bettey Costa, MD   25 mg at 09/30/15 0813  . canagliflozin (INVOKANA) tablet 300 mg  300 mg Oral QAC breakfast Bettey Costa, MD   300 mg at 09/30/15 0811  . chlorproMAZINE (THORAZINE) tablet 10 mg  10 mg Oral TID PRN Lance Coon, MD   10 mg at 09/29/15 1931  . clopidogrel (PLAVIX) tablet 75 mg  75 mg Oral Daily Demetrios Loll, MD   75 mg at 09/30/15 0813  . fentaNYL (DURAGESIC - dosed mcg/hr) patch 75 mcg  75 mcg Transdermal Q72H Bettey Costa, MD   75 mcg at 09/29/15 0819  . furosemide (LASIX) tablet 20 mg  20 mg Oral BID Bettey Costa, MD   20 mg at 09/30/15 0813  . glipiZIDE (GLUCOTROL) tablet 10 mg  10 mg Oral QAC breakfast Bettey Costa, MD   10 mg at 09/30/15 0814  . HYDROcodone-acetaminophen (NORCO/VICODIN) 5-325 MG per tablet 1-2 tablet  1-2 tablet Oral Q4H PRN Bettey Costa, MD   1 tablet at 09/28/15 1829  . insulin aspart (novoLOG) injection 0-5 Units  0-5 Units Subcutaneous QHS Bettey Costa, MD   2 Units at 09/29/15 2320  . insulin aspart (novoLOG) injection 0-9  Units  0-9 Units Subcutaneous TID WC Bettey Costa, MD   2 Units at 09/30/15 0811  . mometasone-formoterol (DULERA) 100-5 MCG/ACT inhaler 2 puff  2 puff Inhalation BID Bettey Costa, MD   2 puff at 09/30/15 0817  . multivitamin with minerals tablet 1 tablet  1 tablet Oral Daily Bettey Costa, MD   1 tablet at 09/30/15 0811  . ondansetron (ZOFRAN) tablet 4 mg  4 mg Oral Q6H PRN Bettey Costa, MD       Or  . ondansetron (ZOFRAN) injection 4 mg  4 mg Intravenous Q6H PRN Sital Mody, MD      . pantoprazole (PROTONIX) EC tablet 40 mg  40 mg Oral BID Bettey Costa, MD   40 mg at 09/30/15 0814  . potassium chloride (K-DUR) CR tablet 10 mEq  10 mEq Oral Daily Bettey Costa, MD   10 mEq at 09/30/15 0813  . senna-docusate (Senokot-S) tablet 1 tablet  1 tablet Oral QHS PRN Bettey Costa, MD   1 tablet at 09/29/15 0819  . sodium chloride 0.9 % injection 10-40 mL  10-40 mL Intracatheter Q12H Demetrios Loll, MD  10 mL at 09/29/15 2305  . sodium chloride 0.9 % injection 10-40 mL  10-40 mL Intracatheter PRN Demetrios Loll, MD   20 mL at 09/28/15 0605  . sotalol (BETAPACE) tablet 80 mg  80 mg Oral Daily Demetrios Loll, MD   80 mg at 09/30/15 0813  . sucralfate (CARAFATE) tablet 1 g  1 g Oral TID WC & HS Bettey Costa, MD   1 g at 09/30/15 [id number]   Facility-Administered Medications Ordered in Other Encounters  Medication Dose Route Frequency Provider Last Rate Last Dose  . chlorhexidine (HIBICLENS) 4 % liquid 4 application  60 mL Topical Once Danae Orleans, PA-C       And  . chlorhexidine (HIBICLENS) 4 % liquid 4 application  60 mL Topical Once Danae Orleans, PA-C      . sodium chloride 0.9 % injection 10 mL  10 mL Intracatheter PRN Lloyd Huger, MD   10 mL at 05/09/15 0900  . sodium chloride 0.9 % injection 3 mL  3 mL Intravenous PRN Lloyd Huger, MD         Objective: Vital signs in last 24 hours: Temp:  [97.8 F (36.6 C)-98.7 F (37.1 C)] 97.8 F (36.6 C) (11/13 0519) Pulse Rate:  [81-95] 89 (11/13 0519) Resp:  [18-20] 20  (11/13 0519) BP: (109-121)/(60-75) 121/71 mmHg (11/13 0519) SpO2:  [97 %-100 %] 98 % (11/13 0519)  Intake/Output from previous day: 11/12 0701 - 11/13 0700 In: 730 [P.O.:720; I.V.:10] Out: 3600 [Urine:3600] Intake/Output this shift: Total I/O In: 480 [P.O.:480] Out: -    Physical Exam  Constitutional: He is well-developed, well-nourished, and in no distress.  Vitals reviewed.   Lab Results:   Recent Labs  09/29/15 0515 09/29/15 1700  WBC 10.8* 10.9*  HGB 10.3* 10.2*  HCT 30.0* 30.9*  PLT 100* 112*   BMET  Recent Labs  09/28/15 0606 09/29/15 0515  NA 135  --   K 3.9  --   CL 108  --   CO2 20*  --   GLUCOSE 136*  --   BUN 31*  --   CREATININE 1.78* 1.77*  CALCIUM 8.6*  --    PT/INR  Recent Labs  09/29/15 0817  LABPROT 17.0*  INR 1.37   ABG No results for input(s): PHART, HCO3 in the last 72 hours.  Invalid input(s): PCO2, PO2  Studies/Results: No results found.   Assessment and Plan: The hematuria has resolved.  Please contact [facility] if further assistance is needed.       LOS: 5 days    Laelle Bridgett J 09/30/2015 [id number]

## 2015-09-30 NOTE — Progress Notes (Signed)
Pt. Discharged to home with [facility] PT via [place]. Discharge instructions and medication regimen reviewed at bedside with patient. Pt. verbalizes understanding of instructions and medication regimen. Prescriptions at pharmacy and pt is aware. Patient assessment unchanged from this morning. TELE discontinued per policy; PAC de-accessed per protocol.

## 2015-09-30 NOTE — Care Management Note (Signed)
Case Management Note  Patient Details  Name: Louis Alexander MRN: [id number] Date of Birth: 1941-08-21  Subjective/Objective:    A referral for home health PT, RN, Aid was faxed and called to [facility]. [name] lives alone now that his wife is in [place]. Daughter is checking on both of them. Dr Ether Griffins was updated on home situation.                 Action/Plan:   Expected Discharge Date:                  Expected Discharge Plan:     In-House Referral:     Discharge planning Services     Post Acute Care Choice:    Choice offered to:     DME Arranged:    DME Agency:     HH Arranged:    [place] Agency:     Status of Service:     Medicare Important Message Given:  Yes Date Medicare IM Given:    Medicare IM give by:    Date Additional Medicare IM Given:    Additional Medicare Important Message give by:     If discussed at [place] of Stay Meetings, dates discussed:    Additional Comments:  Adaysha Dubinsky A, RN 09/30/2015, 12:44 PM

## 2015-09-30 NOTE — Progress Notes (Signed)
RN and tele clerk have noted increased HR 130s-150s sustained while patient is ambulating in room. Patient has not had this occur during any prior ambulation. RN and tele clerk also noted irregularity to rhythm, though P waves are still present. Dr. Ether Griffins paged and made aware; MD aware that patient received atenolol and sotalol this AM as ordered. MD ordered stat EKG and asked RN to check O2 sats while ambulating. If O2 sats are too low, MD would like a CXR ordered.

## 2015-09-30 NOTE — Progress Notes (Signed)
[facility] NAME: Louis Alexander    MR#:  [id number]  DATE OF BIRTH:  [birth year]  SUBJECTIVE:  CHIEF COMPLAINT:   Chief Complaint  Patient presents with  . Shortness of Breath   No hematuria, melena or bloody stool. No hematemesis. SOB on exacertion. Hematuria have subsided. Platelet count remains low but stable  REVIEW OF SYSTEMS:  CONSTITUTIONAL: No fever, has generalized weakness.  EYES: No blurred or double vision.  EARS, NOSE, AND THROAT: No tinnitus or ear pain.  RESPIRATORY: No cough, shortness of breath on exacertion,  No wheezing or hemoptysis.  CARDIOVASCULAR: No chest pain, orthopnea, edema.  GASTROINTESTINAL: No nausea, vomiting, diarrhea or abdominal pain.  GENITOURINARY: No dysuria, no hematuria.  ENDOCRINE: No polyuria, nocturia,  HEMATOLOGY: No anemia, easy bruising or bleeding SKIN: No rash or lesion.  MUSCULOSKELETAL: No joint pain or arthritis.   NEUROLOGIC: No tingling, numbness, weakness.  PSYCHIATRY: No anxiety or depression.   DRUG ALLERGIES:   Allergies  Allergen Reactions  . Celebrex [Celecoxib]     Hematuria Other reaction(s): Bleeding Bleeding in bladder  . Effient [Prasugrel] Other (See Comments)    blood clots, blood in urine Other reaction(s): Bleeding Bleeding in bladder    VITALS:  Blood pressure 108/76, pulse 100, temperature 97.5 F (36.4 C), temperature source Oral, resp. rate 14, height 5\' 10"  (1.778 m), weight 83.961 kg (185 lb 1.6 oz), SpO2 91 %.  PHYSICAL EXAMINATION:  GENERAL:  74 y.o.-year-old patient lying in the bed with no acute distress.  EYES: Pupils equal, round, reactive to light and accommodation. No scleral icterus. Extraocular muscles intact.  HEENT: Head atraumatic, normocephalic. Oropharynx and nasopharynx clear.  NECK:  Supple, no jugular venous distention. No thyroid enlargement, no tenderness.  LUNGS: Normal breath sounds bilaterally, no wheezing,  rales,rhonchi or crepitation. No use of accessory muscles of respiration.  CARDIOVASCULAR: S1, S2 normal. No murmurs, rubs, or gallops.  ABDOMEN: Soft, nontender, nondistended. Bowel sounds present. No organomegaly or mass. Urostomy bag. EXTREMITIES: No pedal edema, cyanosis, or clubbing.  NEUROLOGIC: Cranial nerves II through XII are intact. Muscle strength 5/5 in all extremities. Sensation intact. Gait not checked.  PSYCHIATRIC: The patient is alert and oriented x 3.  SKIN: No obvious rash, lesion, or ulcer.    LABORATORY PANEL:   CBC  Recent Labs Lab 09/29/15 1700  WBC 10.9*  HGB 10.2*  HCT 30.9*  PLT 112*   ------------------------------------------------------------------------------------------------------------------  Chemistries   Recent Labs Lab 09/28/15 0606 09/29/15 0515  NA 135  --   K 3.9  --   CL 108  --   CO2 20*  --   GLUCOSE 136*  --   BUN 31*  --   CREATININE 1.78* 1.77*  CALCIUM 8.6*  --   MG 1.8  --    ------------------------------------------------------------------------------------------------------------------  Cardiac Enzymes No results for input(s): TROPONINI in the last 168 hours. ------------------------------------------------------------------------------------------------------------------  RADIOLOGY:  No results found.  EKG:   Orders placed or performed during the hospital encounter of 09/25/15  . EKG 12-Lead  . EKG 12-Lead  . ED EKG  . ED EKG  . EKG 12-Lead  . EKG 12-Lead    ASSESSMENT AND PLAN:   1. Pulmonary emboli in the right main pulmonary artery and segmental branches on the left.  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 Continue Plavix, aspirin and resumed eliquis yesterday at non loading dose, hemoglobin and. Count remains stable. Likely discharge home today  2. Hematuria: Resolved ,  off Brilinta and high-dose of Eliquis,  follow-up urology as outpatient in 2 weeks after discharge.  3. Guaiac positive stool: No active  bleeding, stable hemoglobin. Continue pantoprazole 40 mg by mouth twice a day, no intervention at present by gastroenterologist  4. History of metastatic prostate cancer: Follow-up of [name] as outpatient.   5. Acute on chronic anemia with urinary blood loss: Status post PRBC transfusion 2 units,  hemoglobin level has improved and remained stable . Follow-up hemoglobin as outpatient.   6. Type 2 diabetes without complication: on sliding scale insulin, glipizide and ADA diet.  7. CAD with previous stents: Continue aspirin, Plavix, simvastatin and sotalol,  atenolol  8 Acute on chronic renal failure due to ATN. Improved some, the patient has Foley catheter , continue follow-up kidney function as outpatient.    9Hyponatremia. Improved..  10 Thrombocytopenia. Improved, Follow-up CBC as outpatient.   . All the records are reviewed and case discussed with Care Management/Social Workerr. Management plans discussed with the patient, his daughter and they are in agreement. Greater than 50% time was spent on coordination of care and face-to-face counseling.  CODE STATUS: Full code  TOTAL TIME TAKING CARE OF THIS PATIENT: 40 minutes.   Possibly discharge today. Depending on CBC results   Markia Kyer M.D on 09/30/2015 at 2:15 PM  Between 7am to 6pm - Pager - [phone]  After 6pm go to [email or web address] - password [email or web address] [facility]  Office  [phone]  CC: Primary care physician; Lelon Huh, MD

## 2015-09-30 NOTE — Progress Notes (Signed)
   SUBJECTIVE: Pt states he is doing well today, much less SOB and no further hematuria. C/o hiccups.    Filed Vitals:   09/29/15 0814 09/29/15 1138 09/29/15 1959 09/30/15 0519  BP: 112/62 117/75 109/60 121/71  Pulse: 62 81 95 89  Temp:  98.2 F (36.8 C) 98.7 F (37.1 C) 97.8 F (36.6 C)  TempSrc:  Oral Oral Oral  Resp:  18 18 20   Height:      Weight:      SpO2:  100% 97% 98%    Intake/Output Summary (Last 24 hours) at 09/30/15 0947 Last data filed at 09/30/15 0943  Gross per 24 hour  Intake   1200 ml  Output   3100 ml  Net  -1900 ml    LABS: Basic Metabolic Panel:  Recent Labs  09/28/15 0606 09/29/15 0515  NA 135  --   K 3.9  --   CL 108  --   CO2 20*  --   GLUCOSE 136*  --   BUN 31*  --   CREATININE 1.78* 1.77*  CALCIUM 8.6*  --   MG 1.8  --    Liver Function Tests: No results for input(s): AST, ALT, ALKPHOS, BILITOT, PROT, ALBUMIN in the last 72 hours. No results for input(s): LIPASE, AMYLASE in the last 72 hours. CBC:  Recent Labs  09/29/15 0515 09/29/15 1700  WBC 10.8* 10.9*  HGB 10.3* 10.2*  HCT 30.0* 30.9*  MCV 85.5 85.4  PLT 100* 112*   Cardiac Enzymes: No results for input(s): CKTOTAL, CKMB, CKMBINDEX, TROPONINI in the last 72 hours. BNP: Invalid input(s): POCBNP D-Dimer: No results for input(s): DDIMER in the last 72 hours. Hemoglobin A1C: No results for input(s): HGBA1C in the last 72 hours. Fasting Lipid Panel: No results for input(s): CHOL, HDL, LDLCALC, TRIG, CHOLHDL, LDLDIRECT in the last 72 hours. Thyroid Function Tests: No results for input(s): TSH, T4TOTAL, T3FREE, THYROIDAB in the last 72 hours.  Invalid input(s): FREET3 Anemia Panel: No results for input(s): VITAMINB12, FOLATE, FERRITIN, TIBC, IRON, RETICCTPCT in the last 72 hours.   PHYSICAL EXAM General: Well developed, well nourished, in no acute distress HEENT: Normocephalic and atramatic Neck: No JVD.  Lungs: Clear bilaterally to auscultation and  percussion. Heart: HRRR . Normal S1 and S2 without gallops or murmurs.  Abdomen: Bowel sounds are positive, abdomen soft and non-tender  Msk: Back normal, normal gait. Normal strength and tone for age. Extremities: No clubbing, cyanosis or edema.  Neuro: Alert and oriented X 3. Psych: Good affect, responds appropriately  TELEMETRY: Reviewed telemetry pt in NSR  ASSESSMENT AND PLAN: 1. Pulmonary embolism: s/p IVC filter, continue eliquis 5mg  BID   2. CAD s/p PCI in July: continue asa and plavix  3. Guaiac positive stools: no testing or intervention, per GI. Will see as outpatient.   4. Thrombocytopenia: improved. Appreciate hematology recs.   Pt ok from cardiology stand point for d/c today. He already has f/u in our office on Thursday.   Patient and plan discussed with supervising provider, Dr. Neoma Laming, who agrees with above findings.   Kelby Fam Loda, [place]  09/30/2015 9:47 AM

## 2015-09-30 NOTE — Progress Notes (Signed)
SATURATION QUALIFICATIONS: (This note is used to comply with regulatory documentation for home oxygen)  Patient Saturations on Room Air at Rest = 100%  Patient Saturations on Room Air while Ambulating = 88%  Patient Saturations on 2 Liters of oxygen while Ambulating = 91%  Please briefly explain why patient needs home oxygen:

## 2015-10-01 ENCOUNTER — Telehealth: Payer: Self-pay | Admitting: Family Medicine

## 2015-10-01 NOTE — Telephone Encounter (Signed)
Pt scheduled hospital f/u for 10/03/15. Pt stated he was discharged from [facility] 09/30/15 for blood clots in lungs. Thanks TNP

## 2015-10-02 ENCOUNTER — Inpatient Hospital Stay: Payer: PPO | Admitting: Oncology

## 2015-10-02 ENCOUNTER — Inpatient Hospital Stay: Payer: PPO

## 2015-10-02 LAB — HEPARIN INDUCED PLATELET AB (HIT ANTIBODY): Heparin Induced Plt Ab: 0.517 OD — ABNORMAL HIGH (ref 0.000–0.400)

## 2015-10-02 LAB — SEROTONIN RELEASE ASSAY (SRA)
SRA .2 IU/mL UFH Ser-aCnc: 1 % (ref 0–20)
SRA 100IU/mL UFH Ser-aCnc: <1 % (ref 0–20)

## 2015-10-03 ENCOUNTER — Ambulatory Visit (INDEPENDENT_AMBULATORY_CARE_PROVIDER_SITE_OTHER): Payer: PPO | Admitting: Family Medicine

## 2015-10-03 ENCOUNTER — Encounter: Payer: Self-pay | Admitting: Vascular Surgery

## 2015-10-03 VITALS — BP 110/60 | HR 69 | Temp 98.7°F | Resp 16 | Wt 188.0 lb

## 2015-10-03 DIAGNOSIS — I82419 Acute embolism and thrombosis of unspecified femoral vein: Secondary | ICD-10-CM

## 2015-10-03 DIAGNOSIS — E871 Hypo-osmolality and hyponatremia: Secondary | ICD-10-CM | POA: Diagnosis not present

## 2015-10-03 DIAGNOSIS — Z95828 Presence of other vascular implants and grafts: Secondary | ICD-10-CM | POA: Diagnosis not present

## 2015-10-03 DIAGNOSIS — D696 Thrombocytopenia, unspecified: Secondary | ICD-10-CM

## 2015-10-03 DIAGNOSIS — I2699 Other pulmonary embolism without acute cor pulmonale: Secondary | ICD-10-CM | POA: Diagnosis not present

## 2015-10-03 NOTE — Progress Notes (Signed)
Patient: Louis Alexander Male    DOB: [birth year]   74 y.o.   MRN: [id number] Visit Date: 10/03/2015  Today's Provider: Lelon Huh, MD   Chief Complaint  Patient presents with  . Hospitalization Follow-up   Subjective:    HPI   Follow up Hospitalization  Patient was admitted to [facility] on 09/25/2015 and discharged on 09/30/2015. He was admitted after CT ordered by Dr. Humphrey Rolls found multiple pulmonary emboli. His only symptom at the time was shortness of breath Subsequent ultrasound found thrombus in right femoral vein. He was admitted and had Brillinta changed to Eliquis. He was prescribed throrazine for hiccoughs, but has not taken any since hiccoughs have since resolved.   He had IVC placed and states that he was told that that it should remain permenantly.   He reports fair compliance with treatment. He is tolerating medications. His breathing is improved, but not to baseline. He remains very fatigued. He was given blood transfusion and found to have thrombocytopenia and hyponatremie.   Lab Results  Component Value Date   WBC 14.5* 09/30/2015   HGB 10.7* 09/30/2015   HCT 33.0* 09/30/2015   MCV 85.5 09/30/2015   PLT 144* 09/30/2015   BMET    Component Value Date/Time   NA 130* 09/30/2015 1544   NA 133* 03/15/2015 0930   NA 139 12/22/2014   K 4.5 09/30/2015 1544   K 4.5 03/15/2015 0930   CL 105 09/30/2015 1544   CL 100* 03/15/2015 0930   CO2 19* 09/30/2015 1544   CO2 26 03/15/2015 0930   GLUCOSE 239* 09/30/2015 1544   GLUCOSE 179* 03/15/2015 0930   BUN 28* 09/30/2015 1544   BUN 21* 03/15/2015 0930   BUN 21 12/22/2014   CREATININE 1.81* 09/30/2015 1544   CREATININE 0.96 03/15/2015 0930   CREATININE 1.0 12/22/2014   CALCIUM 8.9 09/30/2015 1544   CALCIUM 8.7* 03/15/2015 0930   GFRNONAA 35* 09/30/2015 1544   GFRNONAA >60 03/15/2015 0930   GFRAA 41* 09/30/2015 1544   GFRAA >60 03/15/2015 0930    He is scheduled for follow up with Dr. Humphrey Rolls tomorrow and  Dr. Grayland Ormond next week.   He states his blood sugars have been running in the low to high 100s.   ----------------------------------------------------------------------   Allergies  Allergen Reactions  . Celebrex [Celecoxib]     Hematuria Other reaction(s): Bleeding Bleeding in bladder  . Effient [Prasugrel] Other (See Comments)    blood clots, blood in urine Other reaction(s): Bleeding Bleeding in bladder   Previous Medications   APIXABAN (ELIQUIS) 5 MG TABS TABLET    Take 5 mg by mouth 2 (two) times daily.   ASPIRIN EC 81 MG TABLET    Take 81 mg by mouth daily.   ATENOLOL (TENORMIN) 25 MG TABLET    Take 25 mg by mouth daily.   CANAGLIFLOZIN (INVOKANA) 300 MG TABS TABLET    Take 300 mg by mouth daily before breakfast.   CHLORPROMAZINE (THORAZINE) 10 MG TABLET    Take 1 tablet (10 mg total) by mouth 3 (three) times daily as needed for hiccoughs.   CLOPIDOGREL (PLAVIX) 75 MG TABLET    Take 1 tablet (75 mg total) by mouth daily.   COENZYME Q10 10 MG CAPSULE    Take 10 mg by mouth.   FENTANYL (DURAGESIC - DOSED MCG/HR) 75 MCG/HR    Place 1 patch (75 mcg total) onto the skin every 3 (three) days.   FLUTICASONE FUROATE-VILANTEROL (BREO  ELLIPTA) 100-25 MCG/INH AEPB    Inhale 1 Inhaler into the lungs daily.   FUROSEMIDE (LASIX) 20 MG TABLET    Take 20 mg by mouth 2 (two) times daily.   GLIPIZIDE (GLUCOTROL) 10 MG TABLET    TAKE 1 TABLET(S) BY MOUTH DAILY   METFORMIN (GLUCOPHAGE-XR) 500 MG 24 HR TABLET    2 (TWO) TABLET, ORAL, TWO TIMES DAILY   MOMETASONE-FORMOTEROL (DULERA) 100-5 MCG/ACT AERO    Inhale 2 puffs into the lungs 2 (two) times daily.   MULTIPLE VITAMIN (MULTIVITAMIN WITH MINERALS) TABS TABLET    Take 1 tablet by mouth daily.   PANTOPRAZOLE (PROTONIX) 40 MG TABLET    Take 40 mg by mouth 2 (two) times daily.   POTASSIUM CHLORIDE (K-DUR) 10 MEQ TABLET    Take 10 mEq by mouth daily.   PROCHLORPERAZINE (COMPAZINE) 10 MG TABLET    TAKE 1 TABLET BY MOUTH EVERY 6 HOURS AS NEEDED    SOTALOL (BETAPACE) 80 MG TABLET    Take by mouth 2 (two) times daily.   SUCRALFATE (CARAFATE) 1 G TABLET    Take 1 g by mouth 4 (four) times daily -  with meals and at bedtime.    Review of Systems  Respiratory: Positive for shortness of breath.        SOB has improved  Cardiovascular: Negative for chest pain and palpitations.  Neurological: Negative for dizziness and light-headedness.    Social History  Substance Use Topics  . Smoking status: Never Smoker   . Smokeless tobacco: Never Used  . Alcohol Use: 0.0 oz/week    0 Standard drinks or equivalent per week     Comment: occasional   Objective:   BP 110/60 mmHg  Pulse 69  Temp(Src) 98.7 F (37.1 C) (Oral)  Resp 16  Wt 188 lb (85.276 kg)  SpO2 98%  Physical Exam   General Appearance:    Alert, cooperative, no distress  Eyes:    PERRL, conjunctiva/corneas clear, EOM's intact       Lungs:     Clear to auscultation bilaterally, respirations unlabored  Heart:    Regular rate and rhythm. Trace LE edema.   Neurologic:   Awake, alert, oriented x 3. No apparent focal neurological           defect.           Assessment & Plan:     1. Thrombocytopenia (HCC)  - CBC  2. Hyponatremia  - Renal function panel  3. Other acute pulmonary embolism ([place]) Continue current regiment. Follow up [name] next week atherosclerotic heart disease   4. Other pulmonary embolism without acute cor pulmonale, unspecified chronicity ([place])   5. S/P IVC filter   6. Acute deep vein thrombosis (DVT) of femoral vein, unspecified laterality (HCC)         Lelon Huh, MD  [place]

## 2015-10-04 ENCOUNTER — Telehealth: Payer: Self-pay | Admitting: *Deleted

## 2015-10-04 ENCOUNTER — Telehealth: Payer: Self-pay

## 2015-10-04 LAB — RENAL FUNCTION PANEL
Albumin: 3.6 g/dL (ref 3.5–4.8)
BUN/Creatinine Ratio: 18 (ref 10–22)
BUN: 33 mg/dL — ABNORMAL HIGH (ref 8–27)
CALCIUM: 9.5 mg/dL (ref 8.6–10.2)
CO2: 18 mmol/L (ref 18–29)
Chloride: 102 mmol/L (ref 97–106)
Creatinine, Ser: 1.85 mg/dL — ABNORMAL HIGH (ref 0.76–1.27)
GFR calc Af Amer: 41 mL/min/{1.73_m2} — ABNORMAL LOW (ref >59)
GFR, EST NON AFRICAN AMERICAN: 35 mL/min/{1.73_m2} — AB (ref >59)
GLUCOSE: 196 mg/dL — AB (ref 65–99)
PHOSPHORUS: 2.2 mg/dL — AB (ref 2.5–4.5)
POTASSIUM: 5.5 mmol/L — AB (ref 3.5–5.2)
SODIUM: 137 mmol/L (ref 136–144)

## 2015-10-04 LAB — CBC
Hematocrit: 31.4 % — ABNORMAL LOW (ref 37.5–51.0)
Hemoglobin: 10.2 g/dL — ABNORMAL LOW (ref 12.6–17.7)
MCH: 28.1 pg (ref 26.6–33.0)
MCHC: 32.5 g/dL (ref 31.5–35.7)
MCV: 87 fL (ref 79–97)
PLATELETS: 198 10*3/uL (ref 150–379)
RBC: 3.63 x10E6/uL — AB (ref 4.14–5.80)
RDW: 17.7 % — ABNORMAL HIGH (ref 12.3–15.4)
WBC: 12.8 10*3/uL — AB (ref 3.4–10.8)

## 2015-10-04 NOTE — Telephone Encounter (Signed)
Since he has not gotten his last chemo treatment, he cancelled it several times, she is asking if we could order a PET scan to see where things are

## 2015-10-04 NOTE — Telephone Encounter (Signed)
Patient advised as directed below. Labs faxed to Dr. Humphrey Rolls  Thanks,  -Shereese Bonnie

## 2015-10-04 NOTE — Telephone Encounter (Signed)
-----   Message from Birdie Sons, MD sent at 10/04/2015  7:53 AM EST ----- Platelets are back to normal. Kidney functions are stable. Please send copy of labs to Dr. Humphrey Rolls, patient has an appointment this morning. Thanks.

## 2015-10-05 ENCOUNTER — Other Ambulatory Visit: Payer: Self-pay | Admitting: Oncology

## 2015-10-05 ENCOUNTER — Inpatient Hospital Stay: Payer: PPO | Admitting: Family Medicine

## 2015-10-05 NOTE — Telephone Encounter (Addendum)
Per Dr Grayland Ormond his and the patient have discussed the plan and he is scheduled for chemo next week. I explained this to Verdis Frederickson and she said that she and her siblings and mother had talked and decided to take matters into their own hands and see if PET could be done and skip the last chemo tx. She stated she understood that he and md have a plan in place and asked how long after chemo the PET could be done. I explained that it would be 4 - 6 weeks after chemo

## 2015-10-09 ENCOUNTER — Encounter: Payer: Self-pay | Admitting: *Deleted

## 2015-10-09 ENCOUNTER — Inpatient Hospital Stay: Payer: PPO

## 2015-10-09 ENCOUNTER — Inpatient Hospital Stay (HOSPITAL_BASED_OUTPATIENT_CLINIC_OR_DEPARTMENT_OTHER): Payer: PPO | Admitting: Family Medicine

## 2015-10-09 ENCOUNTER — Telehealth: Payer: Self-pay | Admitting: *Deleted

## 2015-10-09 VITALS — BP 140/88 | HR 70 | Temp 97.7°F | Resp 18 | Wt 182.8 lb

## 2015-10-09 DIAGNOSIS — C787 Secondary malignant neoplasm of liver and intrahepatic bile duct: Secondary | ICD-10-CM

## 2015-10-09 DIAGNOSIS — E1165 Type 2 diabetes mellitus with hyperglycemia: Secondary | ICD-10-CM | POA: Diagnosis not present

## 2015-10-09 DIAGNOSIS — I10 Essential (primary) hypertension: Secondary | ICD-10-CM | POA: Diagnosis not present

## 2015-10-09 DIAGNOSIS — E785 Hyperlipidemia, unspecified: Secondary | ICD-10-CM

## 2015-10-09 DIAGNOSIS — C689 Malignant neoplasm of urinary organ, unspecified: Secondary | ICD-10-CM | POA: Diagnosis not present

## 2015-10-09 DIAGNOSIS — I252 Old myocardial infarction: Secondary | ICD-10-CM | POA: Diagnosis not present

## 2015-10-09 DIAGNOSIS — Z923 Personal history of irradiation: Secondary | ICD-10-CM

## 2015-10-09 DIAGNOSIS — R5383 Other fatigue: Secondary | ICD-10-CM | POA: Diagnosis not present

## 2015-10-09 DIAGNOSIS — I82411 Acute embolism and thrombosis of right femoral vein: Secondary | ICD-10-CM | POA: Diagnosis not present

## 2015-10-09 DIAGNOSIS — R531 Weakness: Secondary | ICD-10-CM

## 2015-10-09 DIAGNOSIS — Z8546 Personal history of malignant neoplasm of prostate: Secondary | ICD-10-CM

## 2015-10-09 DIAGNOSIS — Z79899 Other long term (current) drug therapy: Secondary | ICD-10-CM

## 2015-10-09 DIAGNOSIS — M1990 Unspecified osteoarthritis, unspecified site: Secondary | ICD-10-CM

## 2015-10-09 DIAGNOSIS — Z5111 Encounter for antineoplastic chemotherapy: Secondary | ICD-10-CM | POA: Diagnosis not present

## 2015-10-09 DIAGNOSIS — G4730 Sleep apnea, unspecified: Secondary | ICD-10-CM

## 2015-10-09 DIAGNOSIS — Z7984 Long term (current) use of oral hypoglycemic drugs: Secondary | ICD-10-CM

## 2015-10-09 DIAGNOSIS — D649 Anemia, unspecified: Secondary | ICD-10-CM | POA: Diagnosis not present

## 2015-10-09 DIAGNOSIS — E669 Obesity, unspecified: Secondary | ICD-10-CM | POA: Diagnosis not present

## 2015-10-09 DIAGNOSIS — Z7982 Long term (current) use of aspirin: Secondary | ICD-10-CM

## 2015-10-09 DIAGNOSIS — I2510 Atherosclerotic heart disease of native coronary artery without angina pectoris: Secondary | ICD-10-CM | POA: Diagnosis not present

## 2015-10-09 DIAGNOSIS — R102 Pelvic and perineal pain: Secondary | ICD-10-CM

## 2015-10-09 DIAGNOSIS — Z9861 Coronary angioplasty status: Secondary | ICD-10-CM

## 2015-10-09 LAB — CBC WITH DIFFERENTIAL/PLATELET
BASOS ABS: 0.3 10*3/uL — AB (ref 0–0.1)
BASOS PCT: 2 %
EOS ABS: 0.9 10*3/uL — AB (ref 0–0.7)
EOS PCT: 7 %
HCT: 33.2 % — ABNORMAL LOW (ref 40.0–52.0)
Hemoglobin: 11.1 g/dL — ABNORMAL LOW (ref 13.0–18.0)
LYMPHS PCT: 13 %
Lymphs Abs: 1.8 10*3/uL (ref 1.0–3.6)
MCH: 28.3 pg (ref 26.0–34.0)
MCHC: 33.3 g/dL (ref 32.0–36.0)
MCV: 84.9 fL (ref 80.0–100.0)
MONO ABS: 1.4 10*3/uL — AB (ref 0.2–1.0)
Monocytes Relative: 10 %
Neutro Abs: 9.7 10*3/uL — ABNORMAL HIGH (ref 1.4–6.5)
Neutrophils Relative %: 68 %
PLATELETS: 295 10*3/uL (ref 150–440)
RBC: 3.91 MIL/uL — AB (ref 4.40–5.90)
RDW: 17.5 % — AB (ref 11.5–14.5)
WBC: 14.1 10*3/uL — AB (ref 3.8–10.6)

## 2015-10-09 LAB — COMPREHENSIVE METABOLIC PANEL
ALBUMIN: 3.3 g/dL — AB (ref 3.5–5.0)
ALT: 15 U/L — AB (ref 17–63)
AST: 15 U/L (ref 15–41)
Alkaline Phosphatase: 72 U/L (ref 38–126)
Anion gap: 8 (ref 5–15)
BUN: 29 mg/dL — AB (ref 6–20)
CHLORIDE: 107 mmol/L (ref 101–111)
CO2: 19 mmol/L — AB (ref 22–32)
CREATININE: 1.83 mg/dL — AB (ref 0.61–1.24)
Calcium: 9.5 mg/dL (ref 8.9–10.3)
GFR calc Af Amer: 41 mL/min — ABNORMAL LOW (ref >60)
GFR, EST NON AFRICAN AMERICAN: 35 mL/min — AB (ref >60)
GLUCOSE: 202 mg/dL — AB (ref 65–99)
POTASSIUM: 4.7 mmol/L (ref 3.5–5.1)
SODIUM: 134 mmol/L — AB (ref 135–145)
Total Bilirubin: 0.4 mg/dL (ref 0.3–1.2)
Total Protein: 7.1 g/dL (ref 6.5–8.1)

## 2015-10-09 MED ORDER — PALONOSETRON HCL INJECTION 0.25 MG/5ML
0.2500 mg | Freq: Once | INTRAVENOUS | Status: AC
Start: 2015-10-09 — End: 2015-10-09
  Administered 2015-10-09: 0.25 mg via INTRAVENOUS
  Filled 2015-10-09: qty 5

## 2015-10-09 MED ORDER — SODIUM CHLORIDE 0.9 % IJ SOLN
10.0000 mL | INTRAMUSCULAR | Status: DC | PRN
  Administered 2015-10-09: 10 mL via INTRAVENOUS
  Filled 2015-10-09: qty 10

## 2015-10-09 MED ORDER — SODIUM CHLORIDE 0.9 % IV SOLN
1000.0000 mg/m2 | Freq: Once | INTRAVENOUS | Status: AC
  Administered 2015-10-09: 2128 mg via INTRAVENOUS
  Filled 2015-10-09: qty 50.71

## 2015-10-09 MED ORDER — HEPARIN SOD (PORK) LOCK FLUSH 100 UNIT/ML IV SOLN
500.0000 [IU] | Freq: Once | INTRAVENOUS | Status: AC
  Administered 2015-10-09: 500 [IU] via INTRAVENOUS
  Filled 2015-10-09: qty 5

## 2015-10-09 MED ORDER — SODIUM CHLORIDE 0.9 % IV SOLN
Freq: Once | INTRAVENOUS | Status: AC
  Administered 2015-10-09: 12:00:00 via INTRAVENOUS
  Filled 2015-10-09: qty 5

## 2015-10-09 MED ORDER — POTASSIUM CHLORIDE 2 MEQ/ML IV SOLN
Freq: Once | INTRAVENOUS | Status: AC
  Administered 2015-10-09: 10:00:00 via INTRAVENOUS
  Filled 2015-10-09: qty 1000

## 2015-10-09 MED ORDER — SODIUM CHLORIDE 0.9 % IJ SOLN
10.0000 mL | INTRAMUSCULAR | Status: DC | PRN
  Administered 2015-10-09: 10 mL
  Filled 2015-10-09: qty 10

## 2015-10-09 MED ORDER — HEPARIN SOD (PORK) LOCK FLUSH 100 UNIT/ML IV SOLN: 500.0000 [IU] | Freq: Once | INTRAVENOUS | Status: DC | PRN

## 2015-10-09 MED ORDER — SODIUM CHLORIDE 0.9 % IV SOLN
Freq: Once | INTRAVENOUS | Status: AC
  Administered 2015-10-09: 10:00:00 via INTRAVENOUS
  Filled 2015-10-09: qty 1000

## 2015-10-09 MED ORDER — SODIUM CHLORIDE 0.9 % IV SOLN
35.0000 mg/m2 | Freq: Once | INTRAVENOUS | Status: AC
  Administered 2015-10-09: 75 mg via INTRAVENOUS
  Filled 2015-10-09: qty 75

## 2015-10-09 NOTE — Progress Notes (Signed)
[place]  Telephone:(336) [phone] Fax:(336) [phone]  ID: Louis Alexander OB: 16-Jan-1941  MR#: [id number]  [id number]  Patient Care Team: Birdie Sons, MD as PCP - General (Family Medicine) Murrell Redden, MD (Urology) Dionisio David, MD as Consulting Physician (Cardiology) Lloyd Huger, MD as Consulting Physician (Oncology)  CHIEF COMPLAINT:  Chief Complaint  Patient presents with  . Follow-up    urothelial cancer, no complaints    INTERVAL HISTORY: Patient returns to clinic for further evaluation and consideration of cycle 4, day 1 of cisplatin and gemcitabine. The patient was recently discharged from hospital following an acute pulmonary embolism. While in the hospital he did have an IVC filter placed in order to avoid Coumadin therapy. He reports his shortness of breath has improved. He continues to get mildly short of breath with long-distance walking. His weakness and fatigue have improved.  He overall reports feeling fairly well.   REVIEW OF SYSTEMS:   Review of Systems  Constitutional: Positive for malaise/fatigue. Negative for fever and chills.  HENT: Negative for congestion and sore throat.   Respiratory: Positive for cough and shortness of breath.   Cardiovascular: Negative for chest pain, palpitations and leg swelling.  Gastrointestinal: Negative for heartburn, nausea, vomiting, abdominal pain, diarrhea, constipation, blood in stool and melena.  Genitourinary: Negative for urgency, frequency and hematuria.  Musculoskeletal: Negative for myalgias and falls.       Pelvic pain.  Skin: Negative for itching and rash.  Neurological: Negative for dizziness, tingling and focal weakness.  Endo/Heme/Allergies: Does not bruise/bleed easily.    As per HPI. Otherwise, a complete review of systems is negatve.  PAST MEDICAL HISTORY: Past Medical History  Diagnosis Date  . Abnormal EKG   . Coronary artery disease   . Diabetes mellitus  without complication ([place])   . Obesity   . Hyperlipidemia   . Hypertension   . Myocardial infarction ([place]) 2008 and 2012    x 2  . Sleep apnea     could not tolerate cpap mask  . Arthritis     oa  . Cancer [facility])     bladder and prostate  . History of radiation therapy 2012  . History of chemotherapy 2014  . Bladder cancer ([place])   . History of chicken pox     PAST SURGICAL HISTORY: Past Surgical History  Procedure Laterality Date  . Cardiac catheterization  12-06-2003  . Lad stent  2000 x1 stent, 2008 x 1 stent, 2012 x 1 stent  . Hernia repair  yrs ago  . Appendectomy  many yrs ago  . Rotator cuff repair Left yrs ago  . Seed implant to prostate with radiation  2012  . Protatectomy and urostomy  2014  . Total hip arthroplasty Right 12/27/2013    Procedure: RIGHT TOTAL HIP ARTHROPLASTY ANTERIOR APPROACH;  Surgeon: Mauri Pole, MD;  Location: [facility];  Service: Orthopedics;  Laterality: Right;  . Cardiac catheterization N/A 06/04/2015    Procedure: Left Heart Cath and Coronary Angiography;  Surgeon: Corey Skains, MD;  Location: [place] [facility];  Service: Cardiovascular;  Laterality: N/A;  . Cardiac catheterization N/A 06/04/2015    Procedure: Coronary Stent Intervention;  Surgeon: Wellington Hampshire, MD;  Location: [place] [facility];  Service: Cardiovascular;  Laterality: N/A;  . Peripheral vascular catheterization N/A 09/25/2015    Procedure: IVC Filter Insertion;  Surgeon: Katha Cabal, MD;  Location: [place] [facility];  Service: Cardiovascular;  Laterality:  N/A;    FAMILY HISTORY: Reviewed and unchanged. No report of malignancy or chronic disease.     ADVANCED DIRECTIVES:    HEALTH MAINTENANCE: Social History  Substance Use Topics  . Smoking status: Never Smoker   . Smokeless tobacco: Never Used  . Alcohol Use: 0.0 oz/week    0 Standard drinks or equivalent per week     Comment: occasional     Colonoscopy:  PAP:  Bone density:  Lipid  panel:  Allergies  Allergen Reactions  . Celebrex [Celecoxib]     Hematuria Other reaction(s): Bleeding Bleeding in bladder  . Effient [Prasugrel] Other (See Comments)    blood clots, blood in urine Other reaction(s): Bleeding Bleeding in bladder    Current Outpatient Prescriptions  Medication Sig Dispense Refill  . apixaban (ELIQUIS) 5 MG TABS tablet Take 5 mg by mouth 2 (two) times daily.    [name] aspirin EC 81 MG tablet Take 81 mg by mouth daily.    [name] atenolol (TENORMIN) 25 MG tablet Take 25 mg by mouth daily.    . canagliflozin (INVOKANA) 300 MG TABS tablet Take 300 mg by mouth daily before breakfast. 30 tablet 3  . chlorproMAZINE (THORAZINE) 10 MG tablet Take 1 tablet (10 mg total) by mouth 3 (three) times daily as needed for hiccoughs. 60 tablet 3  . clopidogrel (PLAVIX) 75 MG tablet Take 1 tablet (75 mg total) by mouth daily. 30 tablet 6  . Coenzyme Q10 10 MG capsule Take 10 mg by mouth.    . fentaNYL (DURAGESIC - DOSED MCG/HR) 75 MCG/HR Place 1 patch (75 mcg total) onto the skin every 3 (three) days. 10 patch 0  . Fluticasone Furoate-Vilanterol (BREO ELLIPTA) 100-25 MCG/INH AEPB Inhale 1 Inhaler into the lungs daily.    . furosemide (LASIX) 20 MG tablet Take 20 mg by mouth 2 (two) times daily.    [name] glipiZIDE (GLUCOTROL) 10 MG tablet TAKE 1 TABLET(S) BY MOUTH DAILY 30 tablet 6  . metFORMIN (GLUCOPHAGE-XR) 500 MG 24 hr tablet 2 (TWO) TABLET, ORAL, TWO TIMES DAILY 120 tablet 11  . Multiple Vitamin (MULTIVITAMIN WITH MINERALS) TABS tablet Take 1 tablet by mouth daily.    . pantoprazole (PROTONIX) 40 MG tablet Take 40 mg by mouth 2 (two) times daily.    . prochlorperazine (COMPAZINE) 10 MG tablet TAKE 1 TABLET BY MOUTH EVERY 6 HOURS AS NEEDED 40 tablet 1  . sotalol (BETAPACE) 80 MG tablet Take by mouth 2 (two) times daily.    . potassium chloride (K-DUR) 10 MEQ tablet Take 10 mEq by mouth daily.     No current facility-administered medications for this visit.    Facility-Administered Medications Ordered in Other Visits  Medication Dose Route Frequency Provider Last Rate Last Dose  . chlorhexidine (HIBICLENS) 4 % liquid 4 application  60 mL Topical Once Danae Orleans, PA-C       And  . chlorhexidine (HIBICLENS) 4 % liquid 4 application  60 mL Topical Once Danae Orleans, PA-C      . heparin lock flush 100 unit/mL  500 Units Intravenous Once Lloyd Huger, MD      . sodium chloride 0.9 % injection 10 mL  10 mL Intracatheter PRN Lloyd Huger, MD   10 mL at 05/09/15 0900  . sodium chloride 0.9 % injection 10 mL  10 mL Intravenous PRN Lloyd Huger, MD   10 mL at 10/09/15 0848  . sodium chloride 0.9 % injection 3 mL  3 mL Intravenous  PRN Lloyd Huger, MD        OBJECTIVE: Filed Vitals:   10/09/15 0929  BP: 140/88  Pulse: 70  Temp: 97.7 F (36.5 C)  Resp: 18     Body mass index is 26.22 kg/(m^2).    ECOG FS:1 - Symptomatic but completely ambulatory  General: Well-developed, well-nourished, no acute distress. Eyes: anicteric sclera. Lungs: Clear to auscultation bilaterally. Heart: Regular rate and rhythm. No rubs, murmurs, or gallops. Abdomen: Soft, nontender, nondistended. No organomegaly noted, normoactive bowel sounds. Musculoskeletal: No edema, cyanosis, or clubbing. Neuro: Alert, answering all questions appropriately. Cranial nerves grossly intact. Skin: No rashes or petechiae noted. Psych: Normal affect.    LAB RESULTS:  Lab Results  Component Value Date   NA 137 10/03/2015   K 5.5* 10/03/2015   CL 102 10/03/2015   CO2 18 10/03/2015   GLUCOSE 196* 10/03/2015   BUN 33* 10/03/2015   CREATININE 1.85* 10/03/2015   CALCIUM 9.5 10/03/2015   PROT 6.6 09/18/2015   ALBUMIN 3.6 10/03/2015   AST 20 09/18/2015   ALT 17 09/18/2015   ALKPHOS 82 09/18/2015   BILITOT 0.4 09/18/2015   GFRNONAA 35* 10/03/2015   GFRAA 41* 10/03/2015    Lab Results  Component Value Date   WBC 14.1* 10/09/2015   NEUTROABS 9.7*  10/09/2015   HGB 11.1* 10/09/2015   HCT 33.2* 10/09/2015   MCV 84.9 10/09/2015   PLT 295 10/09/2015     STUDIES: Dg Chest 2 View  09/25/2015  CLINICAL DATA:  History of metastatic prostate cancer. The patient was diagnosed with pulmonary embolus 09/21/2015. Increased dyspnea since decreasing anticoagulation therapy. EXAM: CHEST  2 VIEW COMPARISON:  PET CT scan 07/26/2015. FINDINGS: Port-A-Cath is in place. The lungs are clear. Heart size is normal. No pneumothorax or pleural effusion. IMPRESSION: No acute disease. Electronically Signed   By: Inge Rise M.D.   On: 09/25/2015 11:21   US Venous Img Lower Bilateral  09/26/2015  CLINICAL DATA:  History of pulmonary embolism (diagnosed 09/21/2015. Post IVC filter placement (09/26/2015). History of malignancy (lymphoma and lung cancer). Evaluate for DVT. EXAM: BILATERAL LOWER EXTREMITY VENOUS DOPPLER ULTRASOUND TECHNIQUE: Gray-scale sonography with graded compression, as well as color Doppler and duplex ultrasound were performed to evaluate the lower extremity deep venous systems from the level of the common femoral vein and including the common femoral, femoral, profunda femoral, popliteal and calf veins including the posterior tibial, peroneal and gastrocnemius veins when visible. The superficial great saphenous vein was also interrogated. Spectral Doppler was utilized to evaluate flow at rest and with distal augmentation maneuvers in the common femoral, femoral and popliteal veins. COMPARISON:  None. FINDINGS: RIGHT LOWER EXTREMITY There is hypoechoic nonocclusive thrombus within the right common femoral vein (image 7). There is a mixed echogenic expansile near occlusive thrombus within the proximal (image 9), mid (image 10) and distal (image 11) aspects of the right superficial femoral vein. There is hypoechoic expansile near occlusive thrombus within the proximal this vessel aspects of the right popliteal vein (images 12 and 13). The paired right  posterior tibial (image 14) and peroneal (image 5) veins appear occluded where imaged. The right deep femoral vein appears patent were imaged. The right greater saphenous vein as well as the saphenous femoral junction appears patent were imaged. LEFT LOWER EXTREMITY There is hypoechoic nonocclusive thrombus within the left common femoral vein (image 37). There is hypoechoic occlusive thrombus seen throughout the imaged course of the left superficial femoral vein (representative images 40 through  42; image 53 through 58). There is hypoechoic occlusive thrombus within the left deep femoral vein (image 39). There is hypoechoic expansile occlusive thrombus within the proximal and distal aspect of the left popliteal vein (representative images 43 and 44). The paired left posterior tibial veins appear occlude were imaged (image 45). The left peroneal vein appears occluded were imaged. The greater saphenous vein as well as the saphenofemoral junction appears patent where imaged. 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 Electronically Signed   By: Sandi Mariscal M.D.   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  PLAN:    1.  Urothelial carcinoma: Patient unfortunately missed the remainder of cycle 3 due to hospitalization for acute PE. Proceed with cycle 4, day 1 of cisplatin and gemcitabine today. Patient will receive this regimen on days 1 and 8 with day 15 off. We will plan for patient to return in approximately 1 week for day 8 chemotherapy with gemcitabine and cisplatin.  2. Pain: Nearly resolved. Secondary to malignancy. Patient previously had XRT to his pelvic area. Continue current narcotic regimen as prescribed. 3. Hyperglycemia: Patient is diabetic. Continue glipizide and metformin as prescribed. Monitor. 4. Anemia: Mild,  monitor. 5. Atrial fibrillation: Patient's heart is in regular rhythm today. Continue treatment and follow up with cardiology next week.  We'll schedule for restaging PET scan following completion of cycle 4 cisplatin and gemcitabine.  Patient expressed understanding and was in agreement with this plan. He also understands that He can call clinic at any time with any questions, concerns, or complaints.   Urothelial cancer   Staging form: Kidney, AJCC 7th Edition     Clinical stage from 03/16/2015: Stage IV (TX, N1, M1) - Signed by Lloyd Huger, MD on 03/16/2015  Dr. Rudean Hitt was available for consultation and review of plan of care for this patient.  Evlyn Kanner, NP   10/09/2015 9:38 AM

## 2015-10-09 NOTE — Progress Notes (Signed)
Cardiac Individual Treatment Plan  Patient Details  Name: Louis Alexander MRN: [id number] Date of Birth: 04-28-1941 Referring Provider:  No ref. provider found  Initial Encounter Date:  07/31/2015  Visit Diagnosis: S/P PTCA (percutaneous transluminal coronary angioplasty)  Patient's Home Medications on Admission:  Current outpatient prescriptions:  .  apixaban (ELIQUIS) 5 MG TABS tablet, Take 5 mg by mouth 2 (two) times daily., Disp: , Rfl:  .  aspirin EC 81 MG tablet, Take 81 mg by mouth daily., Disp: , Rfl:  .  atenolol (TENORMIN) 25 MG tablet, Take 25 mg by mouth daily., Disp: , Rfl:  .  canagliflozin (INVOKANA) 300 MG TABS tablet, Take 300 mg by mouth daily before breakfast., Disp: 30 tablet, Rfl: 3 .  chlorproMAZINE (THORAZINE) 10 MG tablet, Take 1 tablet (10 mg total) by mouth 3 (three) times daily as needed for hiccoughs., Disp: 60 tablet, Rfl: 3 .  clopidogrel (PLAVIX) 75 MG tablet, Take 1 tablet (75 mg total) by mouth daily., Disp: 30 tablet, Rfl: 6 .  Coenzyme Q10 10 MG capsule, Take 10 mg by mouth., Disp: , Rfl:  .  fentaNYL (DURAGESIC - DOSED MCG/HR) 75 MCG/HR, Place 1 patch (75 mcg total) onto the skin every 3 (three) days., Disp: 10 patch, Rfl: 0 .  Fluticasone Furoate-Vilanterol (BREO ELLIPTA) 100-25 MCG/INH AEPB, Inhale 1 Inhaler into the lungs daily., Disp: , Rfl:  .  furosemide (LASIX) 20 MG tablet, Take 20 mg by mouth 2 (two) times daily., Disp: , Rfl:  .  glipiZIDE (GLUCOTROL) 10 MG tablet, TAKE 1 TABLET(S) BY MOUTH DAILY, Disp: 30 tablet, Rfl: 6 .  metFORMIN (GLUCOPHAGE-XR) 500 MG 24 hr tablet, 2 (TWO) TABLET, ORAL, TWO TIMES DAILY, Disp: 120 tablet, Rfl: 11 .  Multiple Vitamin (MULTIVITAMIN WITH MINERALS) TABS tablet, Take 1 tablet by mouth daily., Disp: , Rfl:  .  pantoprazole (PROTONIX) 40 MG tablet, Take 40 mg by mouth 2 (two) times daily., Disp: , Rfl:  .  potassium chloride (K-DUR) 10 MEQ tablet, Take 10 mEq by mouth daily., Disp: , Rfl:  .  prochlorperazine  (COMPAZINE) 10 MG tablet, TAKE 1 TABLET BY MOUTH EVERY 6 HOURS AS NEEDED, Disp: 40 tablet, Rfl: 1 .  sotalol (BETAPACE) 80 MG tablet, Take by mouth 2 (two) times daily., Disp: , Rfl:  No current facility-administered medications for this visit.  Facility-Administered Medications Ordered in Other Visits:  .  Shower Chin To Toes With 60 mL chlorhexidine (HIBICLENS) the night before surgery, , , Once **AND** Shower Chin To Toes With 60 mL chlorhexidine (HIBICLENS) in AM of surgery after pre-op clip completed, , , Once **AND** chlorhexidine (HIBICLENS) 4 % liquid 4 application, 60 mL, Topical, Once **AND** chlorhexidine (HIBICLENS) 4 % liquid 4 application, 60 mL, Topical, Once, [employer], PA-C .  CISplatin (PLATINOL) 75 mg in sodium chloride 0.9 % 250 mL chemo infusion, 35 mg/m2 (Treatment Plan Actual), Intravenous, Once, Lloyd Huger, MD, Stopped at 10/09/15 1430 .  dextrose 5 % and 0.45% NaCl 1,000 mL with potassium chloride 20 mEq, magnesium sulfate 12 mEq infusion, , Intravenous, Once, Lloyd Huger, MD, Stopped at 10/09/15 1530 .  heparin lock flush 100 unit/mL, 500 Units, Intravenous, Once, Lloyd Huger, MD .  heparin lock flush 100 unit/mL, 500 Units, Intracatheter, Once PRN, Lloyd Huger, MD .  sodium chloride 0.9 % injection 10 mL, 10 mL, Intracatheter, PRN, Lloyd Huger, MD, 10 mL at 05/09/15 0900 .  sodium chloride 0.9 % injection 10 mL,  10 mL, Intravenous, PRN, Lloyd Huger, MD, 10 mL at 10/09/15 0848 .  sodium chloride 0.9 % injection 10 mL, 10 mL, Intracatheter, PRN, Lloyd Huger, MD, 10 mL at 10/09/15 0956 .  sodium chloride 0.9 % injection 3 mL, 3 mL, Intravenous, PRN, Lloyd Huger, MD  Past Medical History: Past Medical History  Diagnosis Date  . Abnormal EKG   . Coronary artery disease   . Diabetes mellitus without complication ([place])   . Obesity   . Hyperlipidemia   . Hypertension   . Myocardial infarction ([place]) 2008 and 2012     x 2  . Sleep apnea     could not tolerate cpap mask  . Arthritis     oa  . Cancer [facility])     bladder and prostate  . History of radiation therapy 2012  . History of chemotherapy 2014  . Bladder cancer ([place])   . History of chicken pox     Tobacco Use: History  Smoking status  . Never Smoker   Smokeless tobacco  . Never Used    Labs: Recent Review Flowsheet Data    Labs for ITP Cardiac and Pulmonary Rehab Latest Ref Rng 10/26/2012 12/22/2014 06/12/2015 09/12/2015   Cholestrol 0 - 200 mg/dL 134 189 - -   LDLCALC - 41 115 - -   HDL 35 - 70 mg/dL 36(L) 41 - -   Trlycerides 40 - 160 mg/dL 284(H) 165(A) - -   Hemoglobin A1c - - 7.2(A) 7.7 9.2       Exercise Target Goals:    Exercise Program Goal: Individual exercise prescription set with THRR, safety & activity barriers. Participant demonstrates ability to understand and report RPE using BORG scale, to self-measure pulse accurately, and to acknowledge the importance of the exercise prescription.  Exercise Prescription Goal: Starting with aerobic activity 30 plus minutes a day, 3 days per week for initial exercise prescription. Provide home exercise prescription and guidelines that participant acknowledges understanding prior to discharge.  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   urostomy tube    Risk Stratification High      6 Minute Walk:     6 Minute Walk      07/31/15 1140       6 Minute Walk   Phase Initial     Distance 850 feet     Walk Time 6 minutes     Resting HR 76 bpm     Resting BP 124/70 mmHg     Max Ex. HR 94 bpm     Max Ex. BP 140/66 mmHg     RPE 12     Symptoms No        Initial Exercise Prescription:     Initial Exercise Prescription - 07/31/15 1100    Date of Initial Exercise  Prescription   Date 07/31/15   Treadmill   MPH 1.6   Grade 0   Minutes 10   Bike   Level 0.4   Minutes 10   Recumbant Bike   Level 3   RPM 40   Watts 25   Minutes 10   NuStep   Level 2   Watts 35   Minutes 15   Arm Ergometer   Level 1   Watts 10   Minutes  10   Arm/Foot Ergometer   Level 4   Watts 12   Minutes 10   Cybex   Level 3   RPM 50   Minutes 10   Recumbant Elliptical   Level 1   RPM 40   Watts 10   Minutes 10   Elliptical   Level 1   Speed 3   Minutes 1   REL-XR   Level 2   Watts 30   Minutes 10   Prescription Details   Frequency (times per week) 3   Duration Progress to 30 minutes of continuous aerobic without signs/symptoms of physical distress   Intensity   THRR REST +  30   Ratings of Perceived Exertion 11-15   Progression Continue progressive overload as per policy without signs/symptoms or physical distress.   Resistance Training   Training Prescription Yes   Weight 2   Reps 10-15      Exercise Prescription Changes:     Exercise Prescription Changes      08/21/15 0700 08/22/15 0700 09/18/15 0700       Exercise Review   Progression --  Only two visits recorded No  Only 2 visits recorded and will be out for several weeks No  Absent since last review; last visit 08/06/15     Response to Exercise   Blood Pressure (Admit)  140/80 mmHg      Blood Pressure (Exercise)  168/82 mmHg      Blood Pressure (Exit)  134/72 mmHg      Heart Rate (Admit)  71 bpm      Heart Rate (Exercise)  95 bpm      Heart Rate (Exit)  75 bpm      Rating of Perceived Exertion (Exercise)  12      Symptoms  Tired      Comments  [name] is going to be out for several weeks due to the cancer that he also has. His workloads may need to be reevaluated upon his return.      Duration  Progress to 30 minutes of continuous aerobic without signs/symptoms of physical distress Progress to 30 minutes of continuous aerobic without signs/symptoms of physical distress      Intensity  Rest + 30 Rest + 30     Progression  Continue progressive overload as per policy without signs/symptoms or physical distress. Continue progressive overload as per policy without signs/symptoms or physical distress.     Resistance Training   Training Prescription  Yes Yes     Weight  2 2     Reps  10-15 10-15     Interval Training   Interval Training  No No     Treadmill   MPH  1.5 1.5     Grade  0 0     Minutes  10 10     REL-XR   Level  2 2     Watts  40 40     Minutes  15 15        Discharge Exercise Prescription (Final Exercise Prescription Changes):     Exercise Prescription Changes - 09/18/15 0700    Exercise Review   Progression No  Absent since last review; last visit 08/06/15   Response to Exercise   Duration Progress to 30 minutes of continuous aerobic without signs/symptoms of physical distress   Intensity Rest + 30   Progression Continue progressive overload as per policy without signs/symptoms or physical distress.   Resistance Training  Training Prescription Yes   Weight 2   Reps 10-15   Interval Training   Interval Training No   Treadmill   MPH 1.5   Grade 0   Minutes 10   REL-XR   Level 2   Watts 40   Minutes 15      Nutrition:  Target Goals: Understanding of nutrition guidelines, daily intake of sodium 1500mg , cholesterol 200mg , calories 30% from fat and 7% or less from saturated fats, daily to have 5 or more servings of fruits and vegetables.  Biometrics:     Pre Biometrics - 07/31/15 1136    Pre Biometrics   Height 5' 10.5" (1.791 m)   Weight 203 lb 4.8 oz (92.216 kg)   Waist Circumference 40.25 inches   Hip Circumference 44.5 inches   Waist to Hip Ratio 0.9 %   BMI (Calculated) 28.8       Nutrition Therapy Plan and Nutrition Goals:   Nutrition Discharge: Rate Your Plate Scores:   Nutrition Goals Re-Evaluation:     Nutrition Goals Re-Evaluation      08/23/15 1536 10/09/15 1333         Personal Goal #1  Re-Evaluation   Personal Goal #1 Early has been out of Cardiac Rehab since 08/06/2015 since he told Roanna Epley that "exercising is antogonizing his tumor/cancer".  Kathy called [place] and said he wants to return to Cardiac REhab in about a month. He is currently getting a cancer treatment today. [place] has nutritionist.         Psychosocial: Target Goals: Acknowledge presence or absence of depression, maximize coping skills, provide positive support system. Participant is able to verbalize types and ability to use techniques and skills needed for reducing stress and depression.  Initial Review & Psychosocial Screening:     Initial Psych Review & Screening - 08/01/15 [place]? Yes   Screening Interventions   Interventions Encouraged to exercise;Program counselor consult      Quality of Life Scores:     Quality of Life - 08/01/15 1208    Quality of Life Scores   Health/Function Pre 19.87 %   Socioeconomic Pre 24.21 %   Psych/Spiritual Pre 24 %   Family Pre 24.8 %   GLOBAL Pre 22.93 %      PHQ-9:     Recent Review Flowsheet Data    Depression screen [facility] 2/9 07/31/2015 05/03/2015   Decreased Interest 0 0   Down, Depressed, Hopeless 0 0   PHQ - 2 Score 0 0   Altered sleeping 3 -   Tired, decreased energy 1 -   Change in appetite 0 -   Feeling bad or failure about yourself  0 -   Trouble concentrating 0 -   Moving slowly or fidgety/restless 1 -   Suicidal thoughts 0 -   PHQ-9 Score 5 -   Difficult doing work/chores Not difficult at all -      Psychosocial Evaluation and Intervention:   Psychosocial Re-Evaluation:     Psychosocial Re-Evaluation      08/23/15 1537 08/29/15 1157 10/09/15 1342       Psychosocial Re-Evaluation   Interventions Encouraged to attend Cardiac Rehabilitation for the exercise Stress management education      Comments Many stressors including -Remon has been out of Cardiac Rehab since  08/06/2015 since he told Roanna Epley that "exercising is antogonizing his tumor/cancer".  Luiscarlos called [place] and said he wants to  return to Cardiac REhab in about a month. He is currently getting a cancer treatment today. [place] has a counselor for their patients also. Jaceon was also reported he was in the hospital for Blood clot        Vocational Rehabilitation: Provide vocational rehab assistance to qualifying candidates.   Vocational Rehab Evaluation & Intervention:     Vocational Rehab - 07/31/15 1102    Initial Vocational Rehab Evaluation & Intervention   Assessment shows need for Vocational Rehabilitation No      Education: Education Goals: Education classes will be provided on a weekly basis, covering required topics. Participant will state understanding/return demonstration of topics presented.  Learning Barriers/Preferences:     Learning Barriers/Preferences - 07/31/15 1102    Learning Barriers/Preferences   Learning Barriers None   Learning Preferences None      Education Topics: General Nutrition Guidelines/Fats and Fiber: -Group instruction provided by verbal, written material, models and posters to present the general guidelines for heart healthy nutrition. Gives an explanation and review of dietary fats and fiber.   Controlling Sodium/Reading Food Labels: -Group verbal and written material supporting the discussion of sodium use in heart healthy nutrition. Review and explanation with models, verbal and written materials for utilization of the food label.   Exercise Physiology & Risk Factors: - Group verbal and written instruction with models to review the exercise physiology of the cardiovascular system and associated critical values. Details cardiovascular disease risk factors and the goals associated with each risk factor.   Aerobic Exercise & Resistance Training: - Gives group verbal and written discussion on the health impact of inactivity. On  the components of aerobic and resistive training programs and the benefits of this training and how to safely progress through these programs.   Flexibility, Balance, General Exercise Guidelines: - Provides group verbal and written instruction on the benefits of flexibility and balance training programs. Provides general exercise guidelines with specific guidelines to those with heart or lung disease. Demonstration and skill practice provided.          Cardiac Rehab from 08/06/2015 in [facility]   Date  08/06/15   Educator  [facility]   Instruction Review Code  2- meets goals/outcomes      Stress Management: - Provides group verbal and written instruction about the health risks of elevated stress, cause of high stress, and healthy ways to reduce stress.   Depression: - Provides group verbal and written instruction on the correlation between heart/lung disease and depressed mood, treatment options, and the stigmas associated with seeking treatment.   Anatomy & Physiology of the Heart: - Group verbal and written instruction and models provide basic cardiac anatomy and physiology, with the coronary electrical and arterial systems. Review of: AMI, Angina, Valve disease, Heart Failure, Cardiac Arrhythmia, Pacemakers, and the ICD.   Cardiac Procedures: - Group verbal and written instruction and models to describe the testing methods done to diagnose heart disease. Reviews the outcomes of the test results. Describes the treatment choices: Medical Management, Angioplasty, or Coronary Bypass Surgery.   Cardiac Medications: - Group verbal and written instruction to review commonly prescribed medications for heart disease. Reviews the medication, class of the drug, and side effects. Includes the steps to properly store meds and maintain the prescription regimen.   Go Sex-Intimacy & Heart Disease, Get SMART - Goal Setting: - Group verbal and written instruction through game format to discuss  heart disease and the return to sexual intimacy. Provides group verbal  and written material to discuss and apply goal setting through the application of the S.M.A.R.T. Method.   Other Matters of the Heart: - Provides group verbal, written materials and models to describe Heart Failure, Angina, Valve Disease, and Diabetes in the realm of heart disease. Includes description of the disease process and treatment options available to the cardiac patient.   Exercise & Equipment Safety: - Individual verbal instruction and demonstration of equipment use and safety with use of the equipment.      Cardiac Rehab from 08/06/2015 in [facility]   Date  08/06/15   Educator  RM   Instruction Review Code  2- meets goals/outcomes      Infection Prevention: - Provides verbal and written material to individual with discussion of infection control including proper hand washing and proper equipment cleaning during exercise session.      Cardiac Rehab from 08/06/2015 in [facility]   Date  08/06/15   Educator  RM   Instruction Review Code  2- meets goals/outcomes      Falls Prevention: - Provides verbal and written material to individual with discussion of falls prevention and safety.   Diabetes: - Individual verbal and written instruction to review signs/symptoms of diabetes, desired ranges of glucose level fasting, after meals and with exercise. Advice that pre and post exercise glucose checks will be done for 3 sessions at entry of program.      Cardiac Rehab from 08/06/2015 in [facility]   Date  07/31/15   Educator  SB   Instruction Review Code  2- meets goals/outcomes       Knowledge Questionnaire Score:     Knowledge Questionnaire Score - 08/01/15 1209    Knowledge Questionnaire Score   Pre Score 22/28      Personal Goals and Risk Factors at Admission:     Personal Goals and Risk Factors at Admission - 07/31/15 1103    Personal Goals and Risk Factors on  Admission   Increase Aerobic Exercise and Physical Activity Yes   Intervention While in program, learn and follow the exercise prescription taught. Start at a low level workload and increase workload after able to maintain previous level for 30 minutes. Increase time before increasing intensity.   Understand more about Heart/Pulmonary Disease. Yes   Intervention While in program utilize professionals for any questions, and attend the education sessions. Great websites to use are www.americanheart.org or www.lung.org for reliable information.   Diabetes Yes   Goal Blood glucose control identified by blood glucose values, HgbA1C. Participant verbalizes understanding of the signs/symptoms of hyper/hypo glycemia, proper foot care and importance of medication and nutrition plan for blood glucose control.   Intervention Provide nutrition & aerobic exercise along with prescribed medications to achieve blood glucose in normal ranges: Fasting 65-99 mg/dL   Hypertension Yes   Goal Participant will see blood pressure controlled within the values of 140/61mm/Hg or within value directed by their physician.   Intervention Provide nutrition & aerobic exercise along with prescribed medications to achieve BP 140/90 or less.   Lipids Yes   Goal Cholesterol controlled with medications as prescribed, with individualized exercise RX and with personalized nutrition plan. Value goals: LDL < 70mg , HDL > 40mg . Participant states understanding of desired cholesterol values and following prescriptions.   Intervention Provide nutrition & aerobic exercise along with prescribed medications to achieve LDL 70mg , HDL >40mg .      Personal Goals and Risk Factors Review:      Goals  and Risk Factor Review      08/23/15 1537 08/29/15 1157 10/09/15 1340       Increase Aerobic Exercise and Physical Activity   Goals Progress/Improvement seen  No No No     Comments Kywon has been out of Cardiac Rehab since 08/06/2015 since he told  Roanna Epley that "exercising is antogonizing his tumor/cancer".  Pacen called [place] and said he wants to return to Cardiac REhab in about a month. He is currently getting a cancer treatment today. [place] has nutritionist. Jahmel was also reported he was in the hospital for Blood clots.     Diabetes   Goal --  Vitaly has been out of Cardiac Rehab since 08/06/2015 since he told Roanna Epley that "exercising is antogonizing his tumor/cancer".  --  See [place] lab work. Has been out since Sept from Cardiac Rehab.      Hypertension   Goal --  Yacob has been out of Cardiac Rehab since 08/06/2015 since he told Roanna Epley that "exercising is antogonizing his tumor/cancer".  --  [place] called [place] and said he wants to return to Cardiac REhab in about a month. He is currently getting a cancer treatment today. [place] has nutritionist. Nadav was also reported he was in the hospital for Blood clot     Abnormal Lipids   Goal   --  Zevon called [place] and said he wants to return to Cardiac REhab in about a month. He is currently getting a cancer treatment today. [place] has nutritionist. Saket was also reported he was in the hospital for Blood clot        Personal Goals Discharge (Final Personal Goals and Risk Factors Review):      Goals and Risk Factor Review - 10/09/15 1340    Increase Aerobic Exercise and Physical Activity   Goals Progress/Improvement seen  No   Comments Ishmel called [place] and said he wants to return to Cardiac REhab in about a month. He is currently getting a cancer treatment today. [place] has nutritionist. Havik was also reported he was in the hospital for Blood clots.   Diabetes   Goal --  See [place] lab work. Has been out since Sept from Cardiac Rehab.    Hypertension   Goal --  Eryck called [place] and said he wants to return to Cardiac REhab in about a month. He is currently getting a cancer treatment today. [place]  has nutritionist. [place]on was also reported he was in the hospital for Blood clot   Abnormal Lipids   Goal --  Stefanos called [place] and said he wants to return to Cardiac REhab in about a month. He is currently getting a cancer treatment today. [place] has nutritionist. Ronit was also reported he was in the hospital for Blood clot      ITP Comments:   Comments: Zaren called [place] and said he wants to return to Cardiac REhab in about a month. He is currently getting a cancer treatment today. [place] has nutritionist. Antwone was also reported he was in the hospital for Blood clot

## 2015-10-09 NOTE — Telephone Encounter (Signed)
Louis Alexander called [place] and said he wants to return to Cardiac REhab in about a month. He is currently getting a cancer treatment today. Tre[place]on was also reported he was in the hospital for Blood clot

## 2015-10-10 DIAGNOSIS — E119 Type 2 diabetes mellitus without complications: Secondary | ICD-10-CM | POA: Diagnosis not present

## 2015-10-10 DIAGNOSIS — I2510 Atherosclerotic heart disease of native coronary artery without angina pectoris: Secondary | ICD-10-CM | POA: Diagnosis not present

## 2015-10-10 DIAGNOSIS — R319 Hematuria, unspecified: Secondary | ICD-10-CM | POA: Diagnosis not present

## 2015-10-10 DIAGNOSIS — I2699 Other pulmonary embolism without acute cor pulmonale: Secondary | ICD-10-CM | POA: Diagnosis not present

## 2015-10-10 DIAGNOSIS — I10 Essential (primary) hypertension: Secondary | ICD-10-CM | POA: Diagnosis not present

## 2015-10-15 ENCOUNTER — Encounter: Payer: Self-pay | Admitting: *Deleted

## 2015-10-16 ENCOUNTER — Inpatient Hospital Stay (HOSPITAL_BASED_OUTPATIENT_CLINIC_OR_DEPARTMENT_OTHER): Payer: PPO | Admitting: Oncology

## 2015-10-16 ENCOUNTER — Inpatient Hospital Stay: Payer: PPO

## 2015-10-16 ENCOUNTER — Encounter: Payer: Self-pay | Admitting: Oncology

## 2015-10-16 VITALS — BP 143/81 | HR 73 | Temp 98.7°F | Resp 20 | Ht 70.5 in | Wt 185.0 lb

## 2015-10-16 DIAGNOSIS — D649 Anemia, unspecified: Secondary | ICD-10-CM

## 2015-10-16 DIAGNOSIS — C787 Secondary malignant neoplasm of liver and intrahepatic bile duct: Secondary | ICD-10-CM

## 2015-10-16 DIAGNOSIS — Z7984 Long term (current) use of oral hypoglycemic drugs: Secondary | ICD-10-CM

## 2015-10-16 DIAGNOSIS — C689 Malignant neoplasm of urinary organ, unspecified: Secondary | ICD-10-CM | POA: Diagnosis not present

## 2015-10-16 DIAGNOSIS — E669 Obesity, unspecified: Secondary | ICD-10-CM

## 2015-10-16 DIAGNOSIS — R102 Pelvic and perineal pain: Secondary | ICD-10-CM

## 2015-10-16 DIAGNOSIS — R531 Weakness: Secondary | ICD-10-CM

## 2015-10-16 DIAGNOSIS — I2510 Atherosclerotic heart disease of native coronary artery without angina pectoris: Secondary | ICD-10-CM

## 2015-10-16 DIAGNOSIS — Z7982 Long term (current) use of aspirin: Secondary | ICD-10-CM

## 2015-10-16 DIAGNOSIS — I10 Essential (primary) hypertension: Secondary | ICD-10-CM

## 2015-10-16 DIAGNOSIS — I82411 Acute embolism and thrombosis of right femoral vein: Secondary | ICD-10-CM

## 2015-10-16 DIAGNOSIS — Z5111 Encounter for antineoplastic chemotherapy: Secondary | ICD-10-CM | POA: Diagnosis not present

## 2015-10-16 DIAGNOSIS — E1165 Type 2 diabetes mellitus with hyperglycemia: Secondary | ICD-10-CM

## 2015-10-16 DIAGNOSIS — M1990 Unspecified osteoarthritis, unspecified site: Secondary | ICD-10-CM

## 2015-10-16 DIAGNOSIS — Z8546 Personal history of malignant neoplasm of prostate: Secondary | ICD-10-CM

## 2015-10-16 DIAGNOSIS — G4730 Sleep apnea, unspecified: Secondary | ICD-10-CM

## 2015-10-16 DIAGNOSIS — R5383 Other fatigue: Secondary | ICD-10-CM

## 2015-10-16 DIAGNOSIS — Z79899 Other long term (current) drug therapy: Secondary | ICD-10-CM

## 2015-10-16 DIAGNOSIS — Z923 Personal history of irradiation: Secondary | ICD-10-CM

## 2015-10-16 DIAGNOSIS — I252 Old myocardial infarction: Secondary | ICD-10-CM

## 2015-10-16 DIAGNOSIS — C7910 Secondary malignant neoplasm of unspecified urinary organs: Secondary | ICD-10-CM

## 2015-10-16 DIAGNOSIS — E785 Hyperlipidemia, unspecified: Secondary | ICD-10-CM

## 2015-10-16 LAB — CBC WITH DIFFERENTIAL/PLATELET
Basophils Absolute: 0.1 10*3/uL (ref 0–0.1)
Basophils Relative: 1 %
EOS PCT: 3 %
Eosinophils Absolute: 0.2 10*3/uL (ref 0–0.7)
HEMATOCRIT: 29.1 % — AB (ref 40.0–52.0)
Hemoglobin: 9.8 g/dL — ABNORMAL LOW (ref 13.0–18.0)
Lymphocytes Relative: 17 %
Lymphs Abs: 1 10*3/uL (ref 1.0–3.6)
MCH: 28.5 pg (ref 26.0–34.0)
MCHC: 33.7 g/dL (ref 32.0–36.0)
MCV: 84.5 fL (ref 80.0–100.0)
MONO ABS: 0.5 10*3/uL (ref 0.2–1.0)
MONOS PCT: 9 %
NEUTROS ABS: 4.3 10*3/uL (ref 1.4–6.5)
Neutrophils Relative %: 70 %
PLATELETS: 110 10*3/uL — AB (ref 150–440)
RBC: 3.44 MIL/uL — ABNORMAL LOW (ref 4.40–5.90)
RDW: 17.7 % — AB (ref 11.5–14.5)
WBC: 6.1 10*3/uL (ref 3.8–10.6)

## 2015-10-16 LAB — COMPREHENSIVE METABOLIC PANEL
ALBUMIN: 3.3 g/dL — AB (ref 3.5–5.0)
ALT: 18 U/L (ref 17–63)
ANION GAP: 10 (ref 5–15)
AST: 22 U/L (ref 15–41)
Alkaline Phosphatase: 77 U/L (ref 38–126)
BILIRUBIN TOTAL: 0.7 mg/dL (ref 0.3–1.2)
BUN: 33 mg/dL — ABNORMAL HIGH (ref 6–20)
CHLORIDE: 104 mmol/L (ref 101–111)
CO2: 16 mmol/L — ABNORMAL LOW (ref 22–32)
Calcium: 8.8 mg/dL — ABNORMAL LOW (ref 8.9–10.3)
Creatinine, Ser: 2.12 mg/dL — ABNORMAL HIGH (ref 0.61–1.24)
GFR calc Af Amer: 34 mL/min — ABNORMAL LOW (ref >60)
GFR, EST NON AFRICAN AMERICAN: 29 mL/min — AB (ref >60)
GLUCOSE: 282 mg/dL — AB (ref 65–99)
POTASSIUM: 4.5 mmol/L (ref 3.5–5.1)
Sodium: 130 mmol/L — ABNORMAL LOW (ref 135–145)
TOTAL PROTEIN: 6.6 g/dL (ref 6.5–8.1)

## 2015-10-16 MED ORDER — PALONOSETRON HCL INJECTION 0.25 MG/5ML
0.2500 mg | Freq: Once | INTRAVENOUS | Status: AC
  Administered 2015-10-16: 0.25 mg via INTRAVENOUS
  Filled 2015-10-16: qty 5

## 2015-10-16 MED ORDER — SODIUM CHLORIDE 0.9 % IV SOLN
Freq: Once | INTRAVENOUS | Status: AC
  Administered 2015-10-16: 10:00:00 via INTRAVENOUS
  Filled 2015-10-16: qty 1000

## 2015-10-16 MED ORDER — SODIUM CHLORIDE 0.9 % IV SOLN
1000.0000 mg/m2 | Freq: Once | INTRAVENOUS | Status: AC
  Administered 2015-10-16: 2128 mg via INTRAVENOUS
  Filled 2015-10-16: qty 50.71

## 2015-10-16 MED ORDER — SODIUM CHLORIDE 0.9 % IV SOLN
Freq: Once | INTRAVENOUS | Status: AC
  Administered 2015-10-16: 13:00:00 via INTRAVENOUS
  Filled 2015-10-16: qty 5

## 2015-10-16 MED ORDER — SODIUM CHLORIDE 0.9 % IV SOLN
35.0000 mg/m2 | Freq: Once | INTRAVENOUS | Status: AC
  Administered 2015-10-16: 75 mg via INTRAVENOUS
  Filled 2015-10-16: qty 75

## 2015-10-16 MED ORDER — POTASSIUM CHLORIDE 2 MEQ/ML IV SOLN
Freq: Once | INTRAVENOUS | Status: AC
  Administered 2015-10-16: 11:00:00 via INTRAVENOUS
  Filled 2015-10-16: qty 1000

## 2015-10-16 MED ORDER — HEPARIN SOD (PORK) LOCK FLUSH 100 UNIT/ML IV SOLN
500.0000 [IU] | Freq: Once | INTRAVENOUS | Status: AC | PRN
  Administered 2015-10-16: 500 [IU]
  Filled 2015-10-16: qty 5

## 2015-10-16 MED ORDER — SODIUM CHLORIDE 0.9 % IJ SOLN
10.0000 mL | INTRAMUSCULAR | Status: DC | PRN
  Administered 2015-10-16: 10 mL
  Filled 2015-10-16: qty 10

## 2015-10-16 NOTE — Progress Notes (Signed)
Patient states "I feel good today. I think the chemotherapy is helping me to feel better." Reports slight SOB on exertion. Denies pain.

## 2015-10-16 NOTE — Progress Notes (Signed)
[facility]: 2.12. MD, Dr. Grayland Ormond, notified via telephone. Per MD, Dr. Grayland Ormond, order: proceed with chemotherapy treatment today.

## 2015-10-17 ENCOUNTER — Telehealth: Payer: Self-pay | Admitting: Family Medicine

## 2015-10-17 NOTE — Telephone Encounter (Signed)
Louis Alexander called saying she was to have a visit with him this week but he is out of town.  She wants to know if she can see him when he comes back and reevaluate him for discharge  Her call back is .[phone]  Thanks. [name]

## 2015-10-17 NOTE — Telephone Encounter (Signed)
Louis Alexander was notified.

## 2015-10-17 NOTE — Telephone Encounter (Signed)
That's fine

## 2015-10-19 ENCOUNTER — Encounter: Payer: PPO | Attending: Cardiovascular Disease

## 2015-10-19 DIAGNOSIS — I214 Non-ST elevation (NSTEMI) myocardial infarction: Secondary | ICD-10-CM | POA: Insufficient documentation

## 2015-10-19 DIAGNOSIS — Z9861 Coronary angioplasty status: Secondary | ICD-10-CM | POA: Insufficient documentation

## 2015-10-21 NOTE — Progress Notes (Signed)
[place]  [phone] [phone]) [phone]  ID: Louis Alexander OB: [birth year]  MR#: [id number]  [id number]  Patient Care Team: Birdie Sons, MD as PCP - General (Family Medicine) Murrell Redden, MD (Urology) Dionisio David, MD as Consulting Physician (Cardiology) Lloyd Huger, MD as Consulting Physician (Oncology)  CHIEF COMPLAINT:  Chief Complaint  Patient presents with  . Biliary Tract Cancer    INTERVAL HISTORY: Patient returns to clinic for further evaluation and consideration of cycle 4, day 8 of cisplatin and gemcitabine. He currently feels well and is asymptomatic. He does not complain of chest pain or shortness of breath. He has no neurologic complaints. Patient denies any fevers. His abdominal and pelvic pain have essentially resolved. He denies any nausea, vomiting, constipation, or diarrhea. Patient offers no further specific complaints today.   REVIEW OF SYSTEMS:   Review of Systems  Constitutional: Positive for malaise/fatigue. Negative for fever.  Respiratory: Negative.  Negative for shortness of breath.   Cardiovascular: Negative.  Negative for chest pain.  Gastrointestinal: Positive for nausea and abdominal pain.  Musculoskeletal: Negative.   Neurological: Positive for weakness.    As per HPI. Otherwise, a complete review of systems is negatve.  PAST MEDICAL HISTORY: Past Medical History  Diagnosis Date  . Abnormal EKG   . Coronary artery disease   . Diabetes mellitus without complication ([place])   . Obesity   . Hyperlipidemia   . Hypertension   . Myocardial infarction ([place]) 2008 and 2012    x 2  . Sleep apnea     could not tolerate cpap mask  . Arthritis     oa  . Cancer [facility])     bladder and prostate  . History of radiation therapy 2012  . History of chemotherapy 2014  . Bladder cancer ([place])   . History of chicken pox     PAST SURGICAL HISTORY: Past Surgical History  Procedure Laterality Date  .  Cardiac catheterization  12-06-2003  . Lad stent  2000 x1 stent, 2008 x 1 stent, 2012 x 1 stent  . Hernia repair  yrs ago  . Appendectomy  many yrs ago  . Rotator cuff repair Left yrs ago  . Seed implant to prostate with radiation  2012  . Protatectomy and urostomy  2014  . Total hip arthroplasty Right 12/27/2013    Procedure: RIGHT TOTAL HIP ARTHROPLASTY ANTERIOR APPROACH;  Surgeon: Mauri Pole, MD;  Location: [facility];  Service: Orthopedics;  Laterality: Right;  . Cardiac catheterization N/A 06/04/2015    Procedure: Left Heart Cath and Coronary Angiography;  Surgeon: Corey Skains, MD;  Location: [place] [facility];  Service: Cardiovascular;  Laterality: N/A;  . Cardiac catheterization N/A 06/04/2015    Procedure: Coronary Stent Intervention;  Surgeon: Wellington Hampshire, MD;  Location: [place] [facility];  Service: Cardiovascular;  Laterality: N/A;  . Peripheral vascular catheterization N/A 09/25/2015    Procedure: IVC Filter Insertion;  Surgeon: Katha Cabal, MD;  Location: [place] [facility];  Service: Cardiovascular;  Laterality: N/A;    FAMILY HISTORY: Reviewed and unchanged. No report of malignancy or chronic disease.     ADVANCED DIRECTIVES:    HEALTH MAINTENANCE: Social History  Substance Use Topics  . Smoking status: Never Smoker   . Smokeless tobacco: Never Used  . Alcohol Use: 0.0 oz/week    0 Standard drinks or equivalent per week     Comment: occasional     Colonoscopy:  PAP:  Bone density:  Lipid panel:  Allergies  Allergen Reactions  . Celebrex [Celecoxib]     Hematuria Other reaction(s): Bleeding Bleeding in bladder  . Effient [Prasugrel] Other (See Comments)    blood clots, blood in urine Other reaction(s): Bleeding Bleeding in bladder    Current Outpatient Prescriptions  Medication Sig Dispense Refill  . apixaban (ELIQUIS) 5 MG TABS tablet Take 5 mg by mouth 2 (two) times daily.    [name] aspirin EC 81 MG tablet Take 81 mg by mouth daily.      [name] atenolol (TENORMIN) 25 MG tablet Take 25 mg by mouth daily.    . canagliflozin (INVOKANA) 300 MG TABS tablet Take 300 mg by mouth daily before breakfast. 30 tablet 3  . chlorproMAZINE (THORAZINE) 10 MG tablet Take 1 tablet (10 mg total) by mouth 3 (three) times daily as needed for hiccoughs. 60 tablet 3  . clopidogrel (PLAVIX) 75 MG tablet Take 1 tablet (75 mg total) by mouth daily. 30 tablet 6  . Coenzyme Q10 10 MG capsule Take 10 mg by mouth.    . Fluticasone Furoate-Vilanterol (BREO ELLIPTA) 100-25 MCG/INH AEPB Inhale 1 Inhaler into the lungs daily.    . furosemide (LASIX) 20 MG tablet Take 20 mg by mouth 2 (two) times daily.    [name] glipiZIDE (GLUCOTROL) 10 MG tablet TAKE 1 TABLET(S) BY MOUTH DAILY 30 tablet 6  . metFORMIN (GLUCOPHAGE-XR) 500 MG 24 hr tablet 2 (TWO) TABLET, ORAL, TWO TIMES DAILY 120 tablet 11  . Multiple Vitamin (MULTIVITAMIN WITH MINERALS) TABS tablet Take 1 tablet by mouth daily.    . pantoprazole (PROTONIX) 40 MG tablet Take 40 mg by mouth 2 (two) times daily.    . potassium chloride (K-DUR) 10 MEQ tablet Take 10 mEq by mouth daily.    . prochlorperazine (COMPAZINE) 10 MG tablet TAKE 1 TABLET BY MOUTH EVERY 6 HOURS AS NEEDED 40 tablet 1  . sotalol (BETAPACE) 80 MG tablet Take by mouth 2 (two) times daily.    . fentaNYL (DURAGESIC - DOSED MCG/HR) 75 MCG/HR Place 1 patch (75 mcg total) onto the skin every 3 (three) days. (Patient not taking: Reported on 10/16/2015) 10 patch 0   No current facility-administered medications for this visit.   Facility-Administered Medications Ordered in Other Visits  Medication Dose Route Frequency Provider Last Rate Last Dose  . chlorhexidine (HIBICLENS) 4 % liquid 4 application  60 mL Topical Once Danae Orleans, PA-C       And  . chlorhexidine (HIBICLENS) 4 % liquid 4 application  60 mL Topical Once Danae Orleans, PA-C      . sodium chloride 0.9 % injection 10 mL  10 mL Intracatheter PRN Lloyd Huger, MD   10 mL at 05/09/15 0900   . sodium chloride 0.9 % injection 3 mL  3 mL Intravenous PRN Lloyd Huger, MD        OBJECTIVE: Filed Vitals:   10/16/15 0903  BP: 143/81  Pulse: 73  Temp: 98.7 F (37.1 C)  Resp: 20     Body mass index is 26.16 kg/(m^2).    ECOG FS:1 - Symptomatic but completely ambulatory  General: Well-developed, well-nourished, no acute distress. Eyes: anicteric sclera. Lungs: Clear to auscultation bilaterally. Heart: Regular rate and rhythm. No rubs, murmurs, or gallops. Abdomen: Soft, nontender, nondistended. No organomegaly noted, normoactive bowel sounds. Musculoskeletal: No edema, cyanosis, or clubbing. Neuro: Alert, answering all questions appropriately. Cranial nerves grossly intact. Skin: No rashes or petechiae noted. Psych:  Normal affect.    LAB RESULTS:  Lab Results  Component Value Date   NA 130* 10/16/2015   K 4.5 10/16/2015   CL 104 10/16/2015   CO2 16* 10/16/2015   GLUCOSE 282* 10/16/2015   BUN 33* 10/16/2015   CREATININE 2.12* 10/16/2015   CALCIUM 8.8* 10/16/2015   PROT 6.6 10/16/2015   ALBUMIN 3.3* 10/16/2015   AST 22 10/16/2015   ALT 18 10/16/2015   ALKPHOS 77 10/16/2015   BILITOT 0.7 10/16/2015   GFRNONAA 29* 10/16/2015   GFRAA 34* 10/16/2015    Lab Results  Component Value Date   WBC 6.1 10/16/2015   NEUTROABS 4.3 10/16/2015   HGB 9.8* 10/16/2015   HCT 29.1* 10/16/2015   MCV 84.5 10/16/2015   PLT 110* 10/16/2015     STUDIES: Dg Chest 2 View  09/25/2015  CLINICAL DATA:  History of metastatic prostate cancer. The patient was diagnosed with pulmonary embolus 09/21/2015. Increased dyspnea since decreasing anticoagulation therapy. EXAM: CHEST  2 VIEW COMPARISON:  PET CT scan 07/26/2015. FINDINGS: Port-A-Cath is in place. The lungs are clear. Heart size is normal. No pneumothorax or pleural effusion. IMPRESSION: No acute disease. Electronically Signed   By: Inge Rise M.D.   On: 09/25/2015 11:21   US Venous Img Lower Bilateral  09/26/2015   CLINICAL DATA:  History of pulmonary embolism (diagnosed 09/21/2015. Post IVC filter placement (09/26/2015). History of malignancy (lymphoma and lung cancer). Evaluate for DVT. EXAM: BILATERAL LOWER EXTREMITY VENOUS DOPPLER ULTRASOUND TECHNIQUE: Gray-scale sonography with graded compression, as well as color Doppler and duplex ultrasound were performed to evaluate the lower extremity deep venous systems from the level of the common femoral vein and including the common femoral, femoral, profunda femoral, popliteal and calf veins including the posterior tibial, peroneal and gastrocnemius veins when visible. The superficial great saphenous vein was also interrogated. Spectral Doppler was utilized to evaluate flow at rest and with distal augmentation maneuvers in the common femoral, femoral and popliteal veins. COMPARISON:  None. FINDINGS: RIGHT LOWER EXTREMITY There is hypoechoic nonocclusive thrombus within the right common femoral vein (image 7). There is a mixed echogenic expansile near occlusive thrombus within the proximal (image 9), mid (image 10) and distal (image 11) aspects of the right superficial femoral vein. There is hypoechoic expansile near occlusive thrombus within the proximal this vessel aspects of the right popliteal vein (images 12 and 13). The paired right posterior tibial (image 14) and peroneal (image 5) veins appear occluded where imaged. The right deep femoral vein appears patent were imaged. The right greater saphenous vein as well as the saphenous femoral junction appears patent were imaged. LEFT LOWER EXTREMITY There is hypoechoic nonocclusive thrombus within the left common femoral vein (image 37). There is hypoechoic occlusive thrombus seen throughout the imaged course of the left superficial femoral vein (representative images 40 through 42; image 53 through 58). There is hypoechoic occlusive thrombus within the left deep femoral vein (image 39). There is hypoechoic expansile occlusive  thrombus within the proximal and distal aspect of the left popliteal vein (representative images 43 and 44). The paired left posterior tibial veins appear occlude were imaged (image 45). The left peroneal vein appears occluded were imaged. The greater saphenous vein as well as the saphenofemoral junction appears patent where imaged. 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 Electronically Signed   By: Sandi Mariscal M.D.   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  PLAN:    1.  Urothelial carcinoma:  Proceed with cycle 4, day 8 of cisplatin and gemcitabine today. Patient will receive this regimen on days 1 and 8 with day 15 off. Will reimage with PET scan on November 06, 2015 with follow-up 1-2 days later to determine if further chemotherapy is necessary at this time.  2. Pain: Nearly resolved. Secondary to malignancy. Patient previously had XRT to his pelvic area. Continue current narcotic regimen as prescribed. 3. Hyperglycemia: Patient is diabetic. Continue glipizide and metformin as prescribed. Monitor. 4. Anemia: Mild, monitor. 5. Atrial fibrillation: Patient's heart is in regular rhythm today. Continue treatment and follow up with cardiology next week. 6. Pulmonary embolism: Continue Eliquis as prescribed. Patient also has an IVC filter in place.  Patient expressed understanding and was in agreement with this plan. He also understands that He can call clinic at any time with any questions, concerns, or complaints.   Urothelial cancer   Staging form: Kidney, AJCC 7th Edition     Clinical stage from 03/16/2015: Stage IV (TX, N1, M1) - Signed by Lloyd Huger, MD on 03/16/2015  Lloyd Huger, MD   10/21/2015 8:54 AM

## 2015-10-22 NOTE — Progress Notes (Signed)
Cardiac Individual Treatment Plan  Patient Details  Name: Louis Alexander MRN: [id number] Date of Birth: 1941-06-24 Referring Provider:  No ref. provider found  Initial Encounter Date:    Visit Diagnosis: No diagnosis found.  Patient's Home Medications on Admission:  Current outpatient prescriptions:  .  apixaban (ELIQUIS) 5 MG TABS tablet, Take 5 mg by mouth 2 (two) times daily., Disp: , Rfl:  .  aspirin EC 81 MG tablet, Take 81 mg by mouth daily., Disp: , Rfl:  .  atenolol (TENORMIN) 25 MG tablet, Take 25 mg by mouth daily., Disp: , Rfl:  .  canagliflozin (INVOKANA) 300 MG TABS tablet, Take 300 mg by mouth daily before breakfast., Disp: 30 tablet, Rfl: 3 .  chlorproMAZINE (THORAZINE) 10 MG tablet, Take 1 tablet (10 mg total) by mouth 3 (three) times daily as needed for hiccoughs., Disp: 60 tablet, Rfl: 3 .  clopidogrel (PLAVIX) 75 MG tablet, Take 1 tablet (75 mg total) by mouth daily., Disp: 30 tablet, Rfl: 6 .  Coenzyme Q10 10 MG capsule, Take 10 mg by mouth., Disp: , Rfl:  .  fentaNYL (DURAGESIC - DOSED MCG/HR) 75 MCG/HR, Place 1 patch (75 mcg total) onto the skin every 3 (three) days. (Patient not taking: Reported on 10/16/2015), Disp: 10 patch, Rfl: 0 .  Fluticasone Furoate-Vilanterol (BREO ELLIPTA) 100-25 MCG/INH AEPB, Inhale 1 Inhaler into the lungs daily., Disp: , Rfl:  .  furosemide (LASIX) 20 MG tablet, Take 20 mg by mouth 2 (two) times daily., Disp: , Rfl:  .  glipiZIDE (GLUCOTROL) 10 MG tablet, TAKE 1 TABLET(S) BY MOUTH DAILY, Disp: 30 tablet, Rfl: 6 .  metFORMIN (GLUCOPHAGE-XR) 500 MG 24 hr tablet, 2 (TWO) TABLET, ORAL, TWO TIMES DAILY, Disp: 120 tablet, Rfl: 11 .  Multiple Vitamin (MULTIVITAMIN WITH MINERALS) TABS tablet, Take 1 tablet by mouth daily., Disp: , Rfl:  .  pantoprazole (PROTONIX) 40 MG tablet, Take 40 mg by mouth 2 (two) times daily., Disp: , Rfl:  .  potassium chloride (K-DUR) 10 MEQ tablet, Take 10 mEq by mouth daily., Disp: , Rfl:  .  prochlorperazine  (COMPAZINE) 10 MG tablet, TAKE 1 TABLET BY MOUTH EVERY 6 HOURS AS NEEDED, Disp: 40 tablet, Rfl: 1 .  sotalol (BETAPACE) 80 MG tablet, Take by mouth 2 (two) times daily., Disp: , Rfl:  No current facility-administered medications for this visit.  Facility-Administered Medications Ordered in Other Visits:  .  Shower Chin To Toes With 60 mL chlorhexidine (HIBICLENS) the night before surgery, , , Once **AND** Shower Chin To Toes With 60 mL chlorhexidine (HIBICLENS) in AM of surgery after pre-op clip completed, , , Once **AND** chlorhexidine (HIBICLENS) 4 % liquid 4 application, 60 mL, Topical, Once **AND** chlorhexidine (HIBICLENS) 4 % liquid 4 application, 60 mL, Topical, Once, [employer], PA-C .  sodium chloride 0.9 % injection 10 mL, 10 mL, Intracatheter, PRN, Lloyd Huger, MD, 10 mL at 05/09/15 0900 .  sodium chloride 0.9 % injection 3 mL, 3 mL, Intravenous, PRN, Lloyd Huger, MD  Past Medical History: Past Medical History  Diagnosis Date  . Abnormal EKG   . Coronary artery disease   . Diabetes mellitus without complication ([place])   . Obesity   . Hyperlipidemia   . Hypertension   . Myocardial infarction ([place]) 2008 and 2012    x 2  . Sleep apnea     could not tolerate cpap mask  . Arthritis     oa  . 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 History of radiation therapy 2012  . History of chemotherapy 2014  . Bladder cancer ([place])   . History of chicken pox     Tobacco Use: History  Smoking status  . Never Smoker   Smokeless tobacco  . Never Used    Labs: Recent Review Flowsheet Data    Labs for ITP Cardiac and Pulmonary Rehab Latest Ref Rng 10/26/2012 12/22/2014 06/12/2015 09/12/2015   Cholestrol 0 - 200 mg/dL 134 189 - -   LDLCALC - 41 115 - -   HDL 35 - 70 mg/dL 36(L) 41 - -   Trlycerides 40 - 160 mg/dL 284(H) 165(A) - -   Hemoglobin A1c - - 7.2(A) 7.7 9.2       Exercise Target Goals:    Exercise Program Goal: Individual exercise prescription set  with THRR, safety & activity barriers. Participant demonstrates ability to understand and report RPE using BORG scale, to self-measure pulse accurately, and to acknowledge the importance of the exercise prescription.  Exercise Prescription Goal: Starting with aerobic activity 30 plus minutes a day, 3 days per week for initial exercise prescription. Provide home exercise prescription and guidelines that participant acknowledges understanding prior to discharge.  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   urostomy tube    Risk Stratification High      6 Minute Walk:     6 Minute Walk      07/31/15 1140       6 Minute Walk   Phase Initial     Distance 850 feet     Walk Time 6 minutes     Resting HR 76 bpm     Resting BP 124/70 mmHg     Max Ex. HR 94 bpm     Max Ex. BP 140/66 mmHg     RPE 12     Symptoms No        Initial Exercise Prescription:     Initial Exercise Prescription - 07/31/15 1100    Date of Initial Exercise Prescription   Date 07/31/15   Treadmill   MPH 1.6   Grade 0   Minutes 10   Bike   Level 0.4   Minutes 10   Recumbant Bike   Level 3   RPM 40   Watts 25   Minutes 10   NuStep   Level 2   Watts 35   Minutes 15   Arm Ergometer   Level 1   Watts 10   Minutes 10   Arm/Foot Ergometer   Level 4   Watts 12   Minutes 10   Cybex   Level 3   RPM 50   Minutes 10   Recumbant Elliptical   Level 1   RPM 40   Watts 10   Minutes 10   Elliptical   Level 1   Speed 3   Minutes 1   REL-XR   Level 2   Watts 30   Minutes 10   Prescription Details   Frequency (times per week) 3   Duration Progress to 30 minutes of continuous aerobic without signs/symptoms of physical distress   Intensity   THRR REST +  30   Ratings of Perceived Exertion 11-15    Progression Continue progressive overload as per policy without  signs/symptoms or physical distress.   Resistance Training   Training Prescription Yes   Weight 2   Reps 10-15      Exercise Prescription Changes:     Exercise Prescription Changes      08/21/15 0700 08/22/15 0700 09/18/15 0700 10/15/15 1400     Exercise Review   Progression --  Only two visits recorded No  Only 2 visits recorded and will be out for several weeks No  Absent since last review; last visit 08/06/15 No  Absent since last review; last visit 08/06/15    Response to Exercise   Blood Pressure (Admit)  140/80 mmHg      Blood Pressure (Exercise)  168/82 mmHg      Blood Pressure (Exit)  134/72 mmHg      Heart Rate (Admit)  71 bpm      Heart Rate (Exercise)  95 bpm      Heart Rate (Exit)  75 bpm      Rating of Perceived Exertion (Exercise)  12      Symptoms  Tired      Comments  [name] is going to be out for several weeks due to the cancer that he also has. His workloads may need to be reevaluated upon his return.      Duration  Progress to 30 minutes of continuous aerobic without signs/symptoms of physical distress Progress to 30 minutes of continuous aerobic without signs/symptoms of physical distress Progress to 30 minutes of continuous aerobic without signs/symptoms of physical distress    Intensity  Rest + 30 Rest + 30 Rest + 30    Progression  Continue progressive overload as per policy without signs/symptoms or physical distress. Continue progressive overload as per policy without signs/symptoms or physical distress. Continue progressive overload as per policy without signs/symptoms or physical distress.    Resistance Training   Training Prescription  Yes Yes Yes    Weight  2 2 2     Reps  10-15 10-15 10-15    Interval Training   Interval Training  No No No    Treadmill   MPH  1.5 1.5 1.5    Grade  0 0 0    Minutes  10 10 10     REL-XR   Level  2 2 2     Watts  40 40 40    Minutes  15 15 15         Discharge Exercise Prescription (Final Exercise Prescription Changes):     Exercise Prescription Changes - 10/15/15 1400    Exercise Review   Progression No  Absent since last review; last visit 08/06/15   Response to Exercise   Duration Progress to 30 minutes of continuous aerobic without signs/symptoms of physical distress   Intensity Rest + 30   Progression Continue progressive overload as per policy without signs/symptoms or physical distress.   Resistance Training   Training Prescription Yes   Weight 2   Reps 10-15   Interval Training   Interval Training No   Treadmill   MPH 1.5   Grade 0   Minutes 10   REL-XR   Level 2   Watts 40   Minutes 15      Nutrition:  Target Goals: Understanding of nutrition guidelines, daily intake of sodium 1500mg , cholesterol 200mg , calories 30% from fat and 7% or less from saturated fats, daily to have 5 or more servings of fruits and vegetables.  Biometrics:     Pre Biometrics - 07/31/15 1136  Pre Biometrics   Height 5' 10.5" (1.791 m)   Weight 203 lb 4.8 oz (92.216 kg)   Waist Circumference 40.25 inches   Hip Circumference 44.5 inches   Waist to Hip Ratio 0.9 %   BMI (Calculated) 28.8       Nutrition Therapy Plan and Nutrition Goals:   Nutrition Discharge: Rate Your Plate Scores:   Nutrition Goals Re-Evaluation:     Nutrition Goals Re-Evaluation      08/23/15 1536 10/09/15 1333         Personal Goal #1 Re-Evaluation   Personal Goal #1 Jacquees has been out of Cardiac Rehab since 08/06/2015 since he told Roanna Epley that "exercising is antogonizing his tumor/cancer".  Arib called [place] and said he wants to return to Cardiac REhab in about a month. He is currently getting a cancer treatment today. [place] has nutritionist.         Psychosocial: Target Goals: Acknowledge presence or absence of depression, maximize coping skills, provide positive support system. Participant is able to verbalize types  and ability to use techniques and skills needed for reducing stress and depression.  Initial Review & Psychosocial Screening:     Initial Psych Review & Screening - 08/01/15 [place]? Yes   Screening Interventions   Interventions Encouraged to exercise;Program counselor consult      Quality of Life Scores:     Quality of Life - 08/01/15 1208    Quality of Life Scores   Health/Function Pre 19.87 %   Socioeconomic Pre 24.21 %   Psych/Spiritual Pre 24 %   Family Pre 24.8 %   GLOBAL Pre 22.93 %      PHQ-9:     Recent Review Flowsheet Data    Depression screen [facility] 2/9 07/31/2015 05/03/2015   Decreased Interest 0 0   Down, Depressed, Hopeless 0 0   PHQ - 2 Score 0 0   Altered sleeping 3 -   Tired, decreased energy 1 -   Change in appetite 0 -   Feeling bad or failure about yourself  0 -   Trouble concentrating 0 -   Moving slowly or fidgety/restless 1 -   Suicidal thoughts 0 -   PHQ-9 Score 5 -   Difficult doing work/chores Not difficult at all -      Psychosocial Evaluation and Intervention:   Psychosocial Re-Evaluation:     Psychosocial Re-Evaluation      08/23/15 1537 08/29/15 1157 10/09/15 1342       Psychosocial Re-Evaluation   Interventions Encouraged to attend Cardiac Rehabilitation for the exercise Stress management education      Comments Many stressors including -Cherie has been out of Cardiac Rehab since 08/06/2015 since he told Roanna Epley that "exercising is antogonizing his tumor/cancer".  Harrell called [place] and said he wants to return to Cardiac REhab in about a month. He is currently getting a cancer treatment today. [place] has a counselor for their patients also. Jaxston was also reported he was in the hospital for Blood clot        Vocational Rehabilitation: Provide vocational rehab assistance to qualifying candidates.   Vocational Rehab Evaluation & Intervention:     Vocational Rehab -  07/31/15 1102    Initial Vocational Rehab Evaluation & Intervention   Assessment shows need for Vocational Rehabilitation No      Education: Education Goals: Education classes will be provided on a weekly basis, covering  required topics. Participant will state understanding/return demonstration of topics presented.  Learning Barriers/Preferences:     Learning Barriers/Preferences - 07/31/15 1102    Learning Barriers/Preferences   Learning Barriers None   Learning Preferences None      Education Topics: General Nutrition Guidelines/Fats and Fiber: -Group instruction provided by verbal, written material, models and posters to present the general guidelines for heart healthy nutrition. Gives an explanation and review of dietary fats and fiber.   Controlling Sodium/Reading Food Labels: -Group verbal and written material supporting the discussion of sodium use in heart healthy nutrition. Review and explanation with models, verbal and written materials for utilization of the food label.   Exercise Physiology & Risk Factors: - Group verbal and written instruction with models to review the exercise physiology of the cardiovascular system and associated critical values. Details cardiovascular disease risk factors and the goals associated with each risk factor.   Aerobic Exercise & Resistance Training: - Gives group verbal and written discussion on the health impact of inactivity. On the components of aerobic and resistive training programs and the benefits of this training and how to safely progress through these programs.   Flexibility, Balance, General Exercise Guidelines: - Provides group verbal and written instruction on the benefits of flexibility and balance training programs. Provides general exercise guidelines with specific guidelines to those with heart or lung disease. Demonstration and skill practice provided.          Cardiac Rehab from 08/06/2015 in [facility] Cardiac Rehab    Date  08/06/15   Educator  [facility]   Instruction Review Code  2- meets goals/outcomes      Stress Management: - Provides group verbal and written instruction about the health risks of elevated stress, cause of high stress, and healthy ways to reduce stress.   Depression: - Provides group verbal and written instruction on the correlation between heart/lung disease and depressed mood, treatment options, and the stigmas associated with seeking treatment.   Anatomy & Physiology of the Heart: - Group verbal and written instruction and models provide basic cardiac anatomy and physiology, with the coronary electrical and arterial systems. Review of: AMI, Angina, Valve disease, Heart Failure, Cardiac Arrhythmia, Pacemakers, and the ICD.   Cardiac Procedures: - Group verbal and written instruction and models to describe the testing methods done to diagnose heart disease. Reviews the outcomes of the test results. Describes the treatment choices: Medical Management, Angioplasty, or Coronary Bypass Surgery.   Cardiac Medications: - Group verbal and written instruction to review commonly prescribed medications for heart disease. Reviews the medication, class of the drug, and side effects. Includes the steps to properly store meds and maintain the prescription regimen.   Go Sex-Intimacy & Heart Disease, Get SMART - Goal Setting: - Group verbal and written instruction through game format to discuss heart disease and the return to sexual intimacy. Provides group verbal and written material to discuss and apply goal setting through the application of the S.M.A.R.T. Method.   Other Matters of the Heart: - Provides group verbal, written materials and models to describe Heart Failure, Angina, Valve Disease, and Diabetes in the realm of heart disease. Includes description of the disease process and treatment options available to the cardiac patient.   Exercise & Equipment Safety: - Individual verbal  instruction and demonstration of equipment use and safety with use of the equipment.      Cardiac Rehab from 08/06/2015 in [facility] Rehab   Date  08/06/15   Educator  RM  Instruction Review Code  2- meets goals/outcomes      Infection Prevention: - Provides verbal and written material to individual with discussion of infection control including proper hand washing and proper equipment cleaning during exercise session.      Cardiac Rehab from 08/06/2015 in [facility] Cardiac Rehab   Date  08/06/15   Educator  RM   Instruction Review Code  2- meets goals/outcomes      Falls Prevention: - Provides verbal and written material to individual with discussion of falls prevention and safety.   Diabetes: - Individual verbal and written instruction to review signs/symptoms of diabetes, desired ranges of glucose level fasting, after meals and with exercise. Advice that pre and post exercise glucose checks will be done for 3 sessions at entry of program.      Cardiac Rehab from 08/06/2015 in [facility] Cardiac Rehab   Date  07/31/15   Educator  SB   Instruction Review Code  2- meets goals/outcomes       Knowledge Questionnaire Score:     Knowledge Questionnaire Score - 08/01/15 1209    Knowledge Questionnaire Score   Pre Score 22/28      Personal Goals and Risk Factors at Admission:     Personal Goals and Risk Factors at Admission - 07/31/15 1103    Personal Goals and Risk Factors on Admission   Increase Aerobic Exercise and Physical Activity Yes   Intervention While in program, learn and follow the exercise prescription taught. Start at a low level workload and increase workload after able to maintain previous level for 30 minutes. Increase time before increasing intensity.   Understand more about Heart/Pulmonary Disease. Yes   Intervention While in program utilize professionals for any questions, and attend the education sessions. Great websites to use are www.americanheart.org or  www.lung.org for reliable information.   Diabetes Yes   Goal Blood glucose control identified by blood glucose values, HgbA1C. Participant verbalizes understanding of the signs/symptoms of hyper/hypo glycemia, proper foot care and importance of medication and nutrition plan for blood glucose control.   Intervention Provide nutrition & aerobic exercise along with prescribed medications to achieve blood glucose in normal ranges: Fasting 65-99 mg/dL   Hypertension Yes   Goal Participant will see blood pressure controlled within the values of 140/40mm/Hg or within value directed by their physician.   Intervention Provide nutrition & aerobic exercise along with prescribed medications to achieve BP 140/90 or less.   Lipids Yes   Goal Cholesterol controlled with medications as prescribed, with individualized exercise RX and with personalized nutrition plan. Value goals: LDL < 70mg , HDL > 40mg . Participant states understanding of desired cholesterol values and following prescriptions.   Intervention Provide nutrition & aerobic exercise along with prescribed medications to achieve LDL 70mg , HDL >40mg .      Personal Goals and Risk Factors Review:      Goals and Risk Factor Review      08/23/15 1537 08/29/15 1157 10/09/15 1340       Increase Aerobic Exercise and Physical Activity   Goals Progress/Improvement seen  No No No     Comments Fonzo has been out of Cardiac Rehab since 08/06/2015 since he told Roanna Epley that "exercising is antogonizing his tumor/cancer".  Dontavius called [place] and said he wants to return to Cardiac REhab in about a month. He is currently getting a cancer treatment today. [place] has nutritionist. Vitaly was also reported he was in the hospital for Blood clots.  Diabetes   Goal --  Darold has been out of Cardiac Rehab since 08/06/2015 since he told Roanna Epley that "exercising is antogonizing his tumor/cancer".  --  See [place] lab work. Has been out since  Sept from Cardiac Rehab.      Hypertension   Goal --  Dontrey has been out of Cardiac Rehab since 08/06/2015 since he told Roanna Epley that "exercising is antogonizing his tumor/cancer".  --  Franko called [place] and said he wants to return to Cardiac REhab in about a month. He is currently getting a cancer treatment today. [place] has nutritionist. Demetric was also reported he was in the hospital for Blood clot     Abnormal Lipids   Goal   --  Johntay called [place] and said he wants to return to Cardiac REhab in about a month. He is currently getting a cancer treatment today. [place] has nutritionist. Asan was also reported he was in the hospital for Blood clot        Personal Goals Discharge (Final Personal Goals and Risk Factors Review):      Goals and Risk Factor Review - 10/09/15 1340    Increase Aerobic Exercise and Physical Activity   Goals Progress/Improvement seen  No   Comments Mylon called [place] and said he wants to return to Cardiac REhab in about a month. He is currently getting a cancer treatment today. [place] has nutritionist. Ahmire was also reported he was in the hospital for Blood clots.   Diabetes   Goal --  See [place] lab work. Has been out since Sept from Cardiac Rehab.    Hypertension   Goal --  Djon called [place] and said he wants to return to Cardiac REhab in about a month. He is currently getting a cancer treatment today. [place] has nutritionist. Jaqwan was also reported he was in the hospital for Blood clot   Abnormal Lipids   Goal --  Blaze called [place] and said he wants to return to Cardiac REhab in about a month. He is currently getting a cancer treatment today. [place] has nutritionist. Shykeem was also reported he was in the hospital for Blood clot      ITP Comments:     ITP Comments      10/22/15 1216           ITP Comments 30 day review preparation: Continue with ITP          Comments: 30 day  review preparation: Continue with ITP

## 2015-10-24 NOTE — Addendum Note (Signed)
Addended by: Lynford Humphrey on: 10/24/2015 10:51 AM   Modules accepted: Orders

## 2015-11-05 ENCOUNTER — Telehealth: Payer: Self-pay | Admitting: *Deleted

## 2015-11-05 NOTE — Telephone Encounter (Signed)
Called to inform patient that form for handicap card has been completed and is ready for pick up, left vm message for patient.

## 2015-11-06 ENCOUNTER — Ambulatory Visit
Admission: RE | Admit: 2015-11-06 | Discharge: 2015-11-06 | Disposition: A | Discharge status: Home / Self Care | Payer: PPO | Source: Ambulatory Visit | Attending: Family Medicine | Admitting: Family Medicine

## 2015-11-06 DIAGNOSIS — C781 Secondary malignant neoplasm of mediastinum: Secondary | ICD-10-CM | POA: Diagnosis not present

## 2015-11-06 DIAGNOSIS — C7951 Secondary malignant neoplasm of bone: Secondary | ICD-10-CM | POA: Insufficient documentation

## 2015-11-06 DIAGNOSIS — R911 Solitary pulmonary nodule: Secondary | ICD-10-CM | POA: Diagnosis not present

## 2015-11-06 DIAGNOSIS — C787 Secondary malignant neoplasm of liver and intrahepatic bile duct: Secondary | ICD-10-CM | POA: Insufficient documentation

## 2015-11-06 DIAGNOSIS — C689 Malignant neoplasm of urinary organ, unspecified: Secondary | ICD-10-CM | POA: Diagnosis not present

## 2015-11-06 LAB — GLUCOSE, CAPILLARY: Glucose-Capillary: 99 mg/dL (ref 65–99)

## 2015-11-06 MED ORDER — FLUDEOXYGLUCOSE F - 18 (FDG) INJECTION
12.4300 | Freq: Once | INTRAVENOUS | Status: AC | PRN
  Administered 2015-11-06: 12.43 via INTRAVENOUS

## 2015-11-08 ENCOUNTER — Ambulatory Visit: Payer: PPO | Admitting: Oncology

## 2015-11-08 ENCOUNTER — Inpatient Hospital Stay: Payer: PPO | Attending: Oncology | Admitting: Oncology

## 2015-11-08 VITALS — BP 134/77 | HR 62 | Temp 98.3°F | Resp 16 | Wt 186.7 lb

## 2015-11-08 DIAGNOSIS — C779 Secondary and unspecified malignant neoplasm of lymph node, unspecified: Secondary | ICD-10-CM

## 2015-11-08 DIAGNOSIS — I10 Essential (primary) hypertension: Secondary | ICD-10-CM | POA: Diagnosis not present

## 2015-11-08 DIAGNOSIS — G4730 Sleep apnea, unspecified: Secondary | ICD-10-CM | POA: Insufficient documentation

## 2015-11-08 DIAGNOSIS — R5383 Other fatigue: Secondary | ICD-10-CM | POA: Insufficient documentation

## 2015-11-08 DIAGNOSIS — M1990 Unspecified osteoarthritis, unspecified site: Secondary | ICD-10-CM | POA: Diagnosis not present

## 2015-11-08 DIAGNOSIS — Z923 Personal history of irradiation: Secondary | ICD-10-CM | POA: Diagnosis not present

## 2015-11-08 DIAGNOSIS — E785 Hyperlipidemia, unspecified: Secondary | ICD-10-CM | POA: Insufficient documentation

## 2015-11-08 DIAGNOSIS — Z7984 Long term (current) use of oral hypoglycemic drugs: Secondary | ICD-10-CM

## 2015-11-08 DIAGNOSIS — Z8546 Personal history of malignant neoplasm of prostate: Secondary | ICD-10-CM

## 2015-11-08 DIAGNOSIS — Z79899 Other long term (current) drug therapy: Secondary | ICD-10-CM | POA: Diagnosis not present

## 2015-11-08 DIAGNOSIS — R531 Weakness: Secondary | ICD-10-CM | POA: Diagnosis not present

## 2015-11-08 DIAGNOSIS — E669 Obesity, unspecified: Secondary | ICD-10-CM | POA: Diagnosis not present

## 2015-11-08 DIAGNOSIS — C7951 Secondary malignant neoplasm of bone: Secondary | ICD-10-CM | POA: Insufficient documentation

## 2015-11-08 DIAGNOSIS — Z7982 Long term (current) use of aspirin: Secondary | ICD-10-CM | POA: Diagnosis not present

## 2015-11-08 DIAGNOSIS — C787 Secondary malignant neoplasm of liver and intrahepatic bile duct: Secondary | ICD-10-CM | POA: Insufficient documentation

## 2015-11-08 DIAGNOSIS — E1165 Type 2 diabetes mellitus with hyperglycemia: Secondary | ICD-10-CM | POA: Insufficient documentation

## 2015-11-08 DIAGNOSIS — I4891 Unspecified atrial fibrillation: Secondary | ICD-10-CM | POA: Diagnosis not present

## 2015-11-08 DIAGNOSIS — Z9221 Personal history of antineoplastic chemotherapy: Secondary | ICD-10-CM | POA: Diagnosis not present

## 2015-11-08 DIAGNOSIS — C679 Malignant neoplasm of bladder, unspecified: Secondary | ICD-10-CM

## 2015-11-08 DIAGNOSIS — Z7901 Long term (current) use of anticoagulants: Secondary | ICD-10-CM

## 2015-11-08 DIAGNOSIS — Z86711 Personal history of pulmonary embolism: Secondary | ICD-10-CM | POA: Diagnosis not present

## 2015-11-08 DIAGNOSIS — I2510 Atherosclerotic heart disease of native coronary artery without angina pectoris: Secondary | ICD-10-CM | POA: Diagnosis not present

## 2015-11-08 DIAGNOSIS — R918 Other nonspecific abnormal finding of lung field: Secondary | ICD-10-CM

## 2015-11-08 DIAGNOSIS — I252 Old myocardial infarction: Secondary | ICD-10-CM | POA: Diagnosis not present

## 2015-11-08 DIAGNOSIS — D649 Anemia, unspecified: Secondary | ICD-10-CM | POA: Insufficient documentation

## 2015-11-13 ENCOUNTER — Encounter: Payer: Self-pay | Admitting: *Deleted

## 2015-11-13 ENCOUNTER — Other Ambulatory Visit: Payer: Self-pay | Admitting: Oncology

## 2015-11-15 ENCOUNTER — Encounter: Payer: Self-pay | Admitting: *Deleted

## 2015-11-15 ENCOUNTER — Other Ambulatory Visit: Payer: Self-pay | Admitting: *Deleted

## 2015-11-15 NOTE — Patient Outreach (Signed)
[facility]) Care Management  11/15/2015  Louis Alexander 1941/03/23 [id number]  Subjective: Telephone call to patient's home number, spoke with patient and HIPAA verified.  Discussed [facility] Care Management services and patient declined services at this time.   Patient states he does not have any care management service needs at this time.  Patient states he longer has home health and is getting ready to start cardiac rehab.   Patient he is no transportation or medication needs at this time.  Patient states he may be interested in the [facility] pharmacy program in the future and declined being referred to [facility] pharmacist at this time.  Patient has agreed to receive [facility] pamphlet, magnet and will contact [facility] if services needed.  Patient very appreciative of call and will let [facility] know if his needs change.  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  Last hospitalization dates of service 09/25/15 -  09/30/15 for acute pulmonary embolus.   Patient currently lives alone and wife is at [facility].  Patient has had home health through [facility] in the past.   Patient was discharged from [facility] Management transition of care services on 11/14/15.   Assessment: Received referral on 11/14/15 from Sheryn Bison at [facility].   Referral states member diagnosed with multiple types of cancer with ongoing treatment.  Has a history of hypertension, diabetes and pulmonary embolus.  Requesting community RNCM.   Plan: RNCM will send patient successful outreach letter, [facility] pamphlet and magnet. RNCM will send case closure letter due to refusal of services to patient's primary MD.  RNCM will notify Damita Rhodie to close case due to refusal of care management services.   Taylour Lietzke H. Annia Friendly, BSN, [place] Management [facility] Telephonic CM Phone: [phone] Fax: [phone]

## 2015-11-16 ENCOUNTER — Telehealth: Payer: Self-pay | Admitting: *Deleted

## 2015-11-16 ENCOUNTER — Encounter: Payer: Self-pay | Admitting: *Deleted

## 2015-11-16 NOTE — Progress Notes (Signed)
Patient is scheduled for CT Guided Lung Biopsy on 11/26/15. Patient will have to discontinue Plavix for 5 days prior to procedure, Eliquis 48 hours prior to procedure. Patient will have to be NPO 8 hours prior to procedure, may take blood pressure or any heart medications with only a sip of water the morning of procedure. Procedure is scheduled at 9:00 am, patient to arrive at 8:00 am. Left message for radiology RN for clarification regarding aspirin and scheduling.

## 2015-11-16 NOTE — Telephone Encounter (Signed)
Called to check on status to return to program. Wants to return next week. Will restart chemo in February.  Plans to return on Wed Jan 4th.

## 2015-11-18 NOTE — Progress Notes (Signed)
[place]  [phone] [phone]) [phone]  ID: Louis Alexander OB: 27-Jul-1941  MR#: [id number]  [id number]  Patient Care Team: Birdie Sons, MD as PCP - General (Family Medicine) Murrell Redden, MD (Urology) Dionisio David, MD as Consulting Physician (Cardiology) Lloyd Huger, MD as Consulting Physician (Oncology)  CHIEF COMPLAINT:  Chief Complaint  Patient presents with  . urothelial cancer    discuss test results    INTERVAL HISTORY: Patient returns to clinic for further evaluation and discussion of his PET scan results. He continues to feel well and is asymptomatic. He does not complain of chest pain or shortness of breath. He has no neurologic complaints. Patient denies any fevers. His abdominal and pelvic pain have essentially resolved. He denies any nausea, vomiting, constipation, or diarrhea. Patient offers no further specific complaints today.   REVIEW OF SYSTEMS:   Review of Systems  Constitutional: Positive for malaise/fatigue. Negative for fever.  Respiratory: Negative.  Negative for shortness of breath.   Cardiovascular: Negative.  Negative for chest pain.  Gastrointestinal: Negative for nausea and abdominal pain.  Musculoskeletal: Negative.   Neurological: Positive for weakness.    As per HPI. Otherwise, a complete review of systems is negatve.  PAST MEDICAL HISTORY: Past Medical History  Diagnosis Date  . Abnormal EKG   . Coronary artery disease   . Diabetes mellitus without complication ([place])   . Obesity   . Hyperlipidemia   . Hypertension   . Myocardial infarction ([place]) 2008 and 2012    x 2  . Sleep apnea     could not tolerate cpap mask  . Arthritis     oa  . Cancer [facility]     bladder and prostate  . History of radiation therapy 2012  . History of chemotherapy 2014  . Bladder cancer ([place])   . History of chicken pox     PAST SURGICAL HISTORY: Past Surgical History  Procedure Laterality Date  .  Cardiac catheterization  12-06-2003  . Lad stent  2000 x1 stent, 2008 x 1 stent, 2012 x 1 stent  . Hernia repair  yrs ago  . Appendectomy  many yrs ago  . Rotator cuff repair Left yrs ago  . Seed implant to prostate with radiation  2012  . Protatectomy and urostomy  2014  . Total hip arthroplasty Right 12/27/2013    Procedure: RIGHT TOTAL HIP ARTHROPLASTY ANTERIOR APPROACH;  Surgeon: Mauri Pole, MD;  Location: [facility];  Service: Orthopedics;  Laterality: Right;  . Cardiac catheterization N/A 06/04/2015    Procedure: Left Heart Cath and Coronary Angiography;  Surgeon: Corey Skains, MD;  Location: [place] [facility];  Service: Cardiovascular;  Laterality: N/A;  . Cardiac catheterization N/A 06/04/2015    Procedure: Coronary Stent Intervention;  Surgeon: Wellington Hampshire, MD;  Location: [place] [facility];  Service: Cardiovascular;  Laterality: N/A;  . Peripheral vascular catheterization N/A 09/25/2015    Procedure: IVC Filter Insertion;  Surgeon: Katha Cabal, MD;  Location: [place] [facility];  Service: Cardiovascular;  Laterality: N/A;    FAMILY HISTORY: Reviewed and unchanged. No report of malignancy or chronic disease.     ADVANCED DIRECTIVES:    HEALTH MAINTENANCE: Social History  Substance Use Topics  . Smoking status: Never Smoker   . Smokeless tobacco: Never Used  . Alcohol Use: 0.0 oz/week    0 Standard drinks or equivalent per week     Comment: occasional  Colonoscopy:  PAP:  Bone density:  Lipid panel:  Allergies  Allergen Reactions  . Celebrex [Celecoxib]     Hematuria Other reaction(s): Bleeding Bleeding in bladder  . Effient [Prasugrel] Other (See Comments)    blood clots, blood in urine Other reaction(s): Bleeding Bleeding in bladder    Current Outpatient Prescriptions  Medication Sig Dispense Refill  . apixaban (ELIQUIS) 5 MG TABS tablet Take 5 mg by mouth 2 (two) times daily.    [name] aspirin EC 81 MG tablet Take 81 mg by mouth daily.      [name] atenolol (TENORMIN) 25 MG tablet Take 25 mg by mouth daily.    . canagliflozin (INVOKANA) 300 MG TABS tablet Take 300 mg by mouth daily before breakfast. 30 tablet 3  . chlorproMAZINE (THORAZINE) 10 MG tablet Take 1 tablet (10 mg total) by mouth 3 (three) times daily as needed for hiccoughs. 60 tablet 3  . clopidogrel (PLAVIX) 75 MG tablet Take 1 tablet (75 mg total) by mouth daily. 30 tablet 6  . Coenzyme Q10 10 MG capsule Take 10 mg by mouth.    . Fluticasone Furoate-Vilanterol (BREO ELLIPTA) 100-25 MCG/INH AEPB Inhale 1 Inhaler into the lungs daily.    . furosemide (LASIX) 20 MG tablet Take 20 mg by mouth 2 (two) times daily.    [name] glipiZIDE (GLUCOTROL) 10 MG tablet TAKE 1 TABLET(S) BY MOUTH DAILY 30 tablet 6  . metFORMIN (GLUCOPHAGE-XR) 500 MG 24 hr tablet 2 (TWO) TABLET, ORAL, TWO TIMES DAILY 120 tablet 11  . Multiple Vitamin (MULTIVITAMIN WITH MINERALS) TABS tablet Take 1 tablet by mouth daily.    . pantoprazole (PROTONIX) 40 MG tablet Take 40 mg by mouth 2 (two) times daily.    . potassium chloride (K-DUR) 10 MEQ tablet Take 10 mEq by mouth daily.    . prochlorperazine (COMPAZINE) 10 MG tablet TAKE 1 TABLET BY MOUTH EVERY 6 HOURS AS NEEDED 40 tablet 1  . sotalol (BETAPACE) 80 MG tablet Take by mouth 2 (two) times daily.    . fentaNYL (DURAGESIC - DOSED MCG/HR) 75 MCG/HR Place 1 patch (75 mcg total) onto the skin every 3 (three) days. (Patient not taking: Reported on 10/16/2015) 10 patch 0   No current facility-administered medications for this visit.   Facility-Administered Medications Ordered in Other Visits  Medication Dose Route Frequency Provider Last Rate Last Dose  . chlorhexidine (HIBICLENS) 4 % liquid 4 application  60 mL Topical Once Danae Orleans, PA-C       And  . chlorhexidine (HIBICLENS) 4 % liquid 4 application  60 mL Topical Once Danae Orleans, PA-C      . sodium chloride 0.9 % injection 10 mL  10 mL Intracatheter PRN Lloyd Huger, MD   10 mL at 05/09/15 0900   . sodium chloride 0.9 % injection 3 mL  3 mL Intravenous PRN Lloyd Huger, MD        OBJECTIVE: Filed Vitals:   11/08/15 1036  BP: 134/77  Pulse: 62  Temp: 98.3 F (36.8 C)  Resp: 16     Body mass index is 26.41 kg/(m^2).    ECOG FS:1 - Symptomatic but completely ambulatory  General: Well-developed, well-nourished, no acute distress. Eyes: anicteric sclera. Lungs: Clear to auscultation bilaterally. Heart: Regular rate and rhythm. No rubs, murmurs, or gallops. Abdomen: Soft, nontender, nondistended. No organomegaly noted, normoactive bowel sounds. Musculoskeletal: No edema, cyanosis, or clubbing. Neuro: Alert, answering all questions appropriately. Cranial nerves grossly intact. Skin: No rashes or petechiae  noted. Psych: Normal affect.    LAB RESULTS:  Lab Results  Component Value Date   NA 130* 10/16/2015   K 4.5 10/16/2015   CL 104 10/16/2015   CO2 16* 10/16/2015   GLUCOSE 282* 10/16/2015   BUN 33* 10/16/2015   CREATININE 2.12* 10/16/2015   CALCIUM 8.8* 10/16/2015   PROT 6.6 10/16/2015   ALBUMIN 3.3* 10/16/2015   AST 22 10/16/2015   ALT 18 10/16/2015   ALKPHOS 77 10/16/2015   BILITOT 0.7 10/16/2015   GFRNONAA 29* 10/16/2015   GFRAA 34* 10/16/2015    Lab Results  Component Value Date   WBC 6.1 10/16/2015   NEUTROABS 4.3 10/16/2015   HGB 9.8* 10/16/2015   HCT 29.1* 10/16/2015   MCV 84.5 10/16/2015   PLT 110* 10/16/2015     STUDIES: Nm Pet Image Restag (ps) Skull Base To Thigh  11/06/2015  CLINICAL DATA:  Subsequent Treatment strategy for urothelial carcinoma. EXAM: NUCLEAR MEDICINE PET SKULL BASE TO THIGH TECHNIQUE: 12.4 mCi F-18 FDG was injected intravenously. Full-ring PET imaging was performed from the skull base to thigh after the radiotracer. CT data was obtained and used for attenuation correction and anatomic localization. FASTING BLOOD GLUCOSE:  Value: 99 mg/dl COMPARISON:  07/26/2015 FINDINGS: NECK No hypermetabolic lymph nodes in the neck.  CHEST There is a 9 mm subcarinal lymph node which has an SUV max equal 11.69. On the previous exam this measured 11 mm and had an SUV max equal to 9.8. No pleural fluid. New cavitary nodule within the left upper lobe measures 2.8 cm and has an SUV max equal to 3.68, image 106 of series 3. Stable 6 mm Perifissural nodule within the right upper lung zone, image 99 of series 3. ABDOMEN/PELVIS Hypermetabolic liver lesions are again noted. The index lesion within the anterior right hepatic lobe measures 3.5 cm, image 139 of series 3, and has an SUV max equal to 15.9. Previously this measured 3.6 cm and had an SUV max equal to 15.4. The index lesion within the lateral segment of left lobe of liver measures 2.7 cm and has an SUV max equal to 18.8, image 161 of series 3. On the previous exam this measure 2.7 cm and had an SUV max equal to 17.9. No abnormal uptake identified within the pancreas, spleen or adrenal glands. Hyper meta retroperitoneal lymph nodes within the upper abdomen are again noted. Sub cm periaortic lymph node have an SUV max equal to 3.86. Previously 4.8. Enlarged and hypermetabolic bilateral inguinal nodes are identified. Left inguinal node measure 1.5 cm and has an SUV max equal to 20.3, image 260 of series 3. Previously this measured 1.9 cm and had an SUV max equal to 18.9. Hypermetabolic penile tumor has an SUV max equal to 26. Previously this measured the same. SKELETON A hypermetabolic bone metastases are identified. Index lesion involving the posterior right iliac bone measure 4.4 cm and has an SUV max equal to 8.58 previously this measured the same. Two foci of abnormal uptake within the roof of the right acetabulum have an SUV max equal to 12.18. Also unchanged. IMPRESSION: 1. There is a new, cavitary nodule identified within the left upper lobe which exhibits malignant range FDG uptake. 2. The remainder of the exam is not significantly changed in the interval. 3. Stable liver metastasis 4.  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 5. Stable bone metastasis. Electronically Signed   By: Kerby Moors M.D.   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  PLAN:    1.  Urothelial carcinoma:  PET scan results reviewed independently and reported as above with at least stable disease other than a new cavitary nodule in his left upper lobe. After lengthy discussion with the patient and he would like some time off prior to continuing with chemotherapy. He will return to clinic the first week of February for consideration of cycle 5, day 1 of cisplatin and gemcitabine. If patient progresses can consider treatment with atezolizumab 2. Pain: Nearly resolved. Secondary to malignancy. Patient previously had XRT to his pelvic area. Continue current narcotic regimen as prescribed. 3. Hyperglycemia: Patient is diabetic. Continue glipizide and metformin as prescribed. Monitor. 4. Anemia: Mild, monitor. 5. Atrial fibrillation: Patient's heart is in regular rhythm today. Continue treatment and follow up with cardiology next week. 6. Pulmonary embolism: Continue Eliquis as prescribed. Patient also has an IVC filter in place.  7. New pulmonary nodule: Recommended CT-guided biopsy to assess if this is his underlying urothelial cancer, a new primary lesion, or infectious origin.  Patient expressed understanding and was in agreement with this plan. He also understands that He can call clinic at any time with any questions, concerns, or complaints.   Urothelial cancer   Staging form: Kidney, AJCC 7th Edition     Clinical stage from 03/16/2015: Stage IV (TX, N1, M1) - Signed by Lloyd Huger, MD on 03/16/2015  Lloyd Huger, MD   11/18/2015 10:57 AM

## 2015-11-18 NOTE — Progress Notes (Signed)
Cardiac Individual Treatment Plan  Patient Details  Name: Louis Alexander MRN: [id number] Date of Birth: [birth year] Referring Provider:  Dionisio David, MD  Initial Encounter Date:    Visit Diagnosis: NSTEMI (non-ST elevated myocardial infarction) - Plan: CARDIAC REHAB 30 DAY REVIEW  S/P PTCA (percutaneous transluminal coronary angioplasty) - Plan: CARDIAC REHAB 30 DAY REVIEW  Patient's Home Medications on Admission:  Current outpatient prescriptions:  .  apixaban (ELIQUIS) 5 MG TABS tablet, Take 5 mg by mouth 2 (two) times daily., Disp: , Rfl:  .  aspirin EC 81 MG tablet, Take 81 mg by mouth daily., Disp: , Rfl:  .  atenolol (TENORMIN) 25 MG tablet, Take 25 mg by mouth daily., Disp: , Rfl:  .  canagliflozin (INVOKANA) 300 MG TABS tablet, Take 300 mg by mouth daily before breakfast., Disp: 30 tablet, Rfl: 3 .  chlorproMAZINE (THORAZINE) 10 MG tablet, Take 1 tablet (10 mg total) by mouth 3 (three) times daily as needed for hiccoughs., Disp: 60 tablet, Rfl: 3 .  clopidogrel (PLAVIX) 75 MG tablet, Take 1 tablet (75 mg total) by mouth daily., Disp: 30 tablet, Rfl: 6 .  Coenzyme Q10 10 MG capsule, Take 10 mg by mouth., Disp: , Rfl:  .  fentaNYL (DURAGESIC - DOSED MCG/HR) 75 MCG/HR, Place 1 patch (75 mcg total) onto the skin every 3 (three) days. (Patient not taking: Reported on 10/16/2015), Disp: 10 patch, Rfl: 0 .  Fluticasone Furoate-Vilanterol (BREO ELLIPTA) 100-25 MCG/INH AEPB, Inhale 1 Inhaler into the lungs daily., Disp: , Rfl:  .  furosemide (LASIX) 20 MG tablet, Take 20 mg by mouth 2 (two) times daily., Disp: , Rfl:  .  glipiZIDE (GLUCOTROL) 10 MG tablet, TAKE 1 TABLET(S) BY MOUTH DAILY, Disp: 30 tablet, Rfl: 6 .  metFORMIN (GLUCOPHAGE-XR) 500 MG 24 hr tablet, 2 (TWO) TABLET, ORAL, TWO TIMES DAILY, Disp: 120 tablet, Rfl: 11 .  Multiple Vitamin (MULTIVITAMIN WITH MINERALS) TABS tablet, Take 1 tablet by mouth daily., Disp: , Rfl:  .  pantoprazole (PROTONIX) 40 MG tablet, Take 40 mg  by mouth 2 (two) times daily., Disp: , Rfl:  .  potassium chloride (K-DUR) 10 MEQ tablet, Take 10 mEq by mouth daily., Disp: , Rfl:  .  prochlorperazine (COMPAZINE) 10 MG tablet, TAKE 1 TABLET BY MOUTH EVERY 6 HOURS AS NEEDED, Disp: 40 tablet, Rfl: 1 .  sotalol (BETAPACE) 80 MG tablet, Take by mouth 2 (two) times daily., Disp: , Rfl:  No current facility-administered medications for this visit.  Facility-Administered Medications Ordered in Other Visits:  .  Shower Chin To Toes With 60 mL chlorhexidine (HIBICLENS) the night before surgery, , , Once **AND** Shower Chin To Toes With 60 mL chlorhexidine (HIBICLENS) in AM of surgery after pre-op clip completed, , , Once **AND** chlorhexidine (HIBICLENS) 4 % liquid 4 application, 60 mL, Topical, Once **AND** chlorhexidine (HIBICLENS) 4 % liquid 4 application, 60 mL, Topical, Once, [employer], PA-C .  sodium chloride 0.9 % injection 10 mL, 10 mL, Intracatheter, PRN, Lloyd Huger, MD, 10 mL at 05/09/15 0900 .  sodium chloride 0.9 % injection 3 mL, 3 mL, Intravenous, PRN, Lloyd Huger, MD  Past Medical History: Past Medical History  Diagnosis Date  . Abnormal EKG   . Coronary artery disease   . Diabetes mellitus without complication ([place])   . Obesity   . Hyperlipidemia   . Hypertension   . Myocardial infarction ([place]) 2008 and 2012    x 2  . Sleep apnea  could not tolerate cpap mask  . Arthritis     oa  . Cancer [facility])     bladder and prostate  . History of radiation therapy 2012  . History of chemotherapy 2014  . Bladder cancer ([place])   . History of chicken pox     Tobacco Use: History  Smoking status  . Never Smoker   Smokeless tobacco  . Never Used    Labs: Recent Review Flowsheet Data    Labs for ITP Cardiac and Pulmonary Rehab Latest Ref Rng 10/26/2012 12/22/2014 06/12/2015 09/12/2015   Cholestrol 0 - 200 mg/dL 134 189 - -   LDLCALC - 41 115 - -   HDL 35 - 70 mg/dL 36(L) 41 - -   Trlycerides 40 - 160 mg/dL  284(H) 165(A) - -   Hemoglobin A1c - - 7.2(A) 7.7 9.2       Exercise Target Goals:    Exercise Program Goal: Individual exercise prescription set with THRR, safety & activity barriers. Participant demonstrates ability to understand and report RPE using BORG scale, to self-measure pulse accurately, and to acknowledge the importance of the exercise prescription.  Exercise Prescription Goal: Starting with aerobic activity 30 plus minutes a day, 3 days per week for initial exercise prescription. Provide home exercise prescription and guidelines that participant acknowledges understanding prior to discharge.  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   urostomy tube    Risk Stratification High      6 Minute Walk:     6 Minute Walk      07/31/15 1140       6 Minute Walk   Phase Initial     Distance 850 feet     Walk Time 6 minutes     Resting HR 76 bpm     Resting BP 124/70 mmHg     Max Ex. HR 94 bpm     Max Ex. BP 140/66 mmHg     RPE 12     Symptoms No        Initial Exercise Prescription:     Initial Exercise Prescription - 07/31/15 1100    Date of Initial Exercise Prescription   Date 07/31/15   Treadmill   MPH 1.6   Grade 0   Minutes 10   Bike   Level 0.4   Minutes 10   Recumbant Bike   Level 3   RPM 40   Watts 25   Minutes 10   NuStep   Level 2   Watts 35   Minutes 15   Arm Ergometer   Level 1   Watts 10   Minutes 10   Arm/Foot Ergometer   Level 4   Watts 12   Minutes 10   Cybex   Level 3   RPM 50   Minutes 10   Recumbant Elliptical   Level 1   RPM 40   Watts 10   Minutes 10   Elliptical   Level 1   Speed 3   Minutes 1   REL-XR   Level 2   Watts 30   Minutes 10   Prescription Details   Frequency (times per week) 3   Duration  Progress to 30 minutes of continuous aerobic without signs/symptoms of physical distress   Intensity   THRR  REST +  30   Ratings of Perceived Exertion 11-15   Progression Continue progressive overload as per policy without signs/symptoms or physical distress.   Resistance Training   Training Prescription Yes   Weight 2   Reps 10-15      Exercise Prescription Changes:     Exercise Prescription Changes      08/21/15 0700 08/22/15 0700 09/18/15 0700 10/15/15 1400 11/13/15 0700   Exercise Review   Progression --  Only two visits recorded No  Only 2 visits recorded and will be out for several weeks No  Absent since last review; last visit 08/06/15 No  Absent since last review; last visit 08/06/15 No  Absent since last review; last visit 08/06/15   Response to Exercise   Blood Pressure (Admit)  140/80 mmHg      Blood Pressure (Exercise)  168/82 mmHg      Blood Pressure (Exit)  134/72 mmHg      Heart Rate (Admit)  71 bpm      Heart Rate (Exercise)  95 bpm      Heart Rate (Exit)  75 bpm      Rating of Perceived Exertion (Exercise)  12      Symptoms  Tired      Comments  [name] is going to be out for several weeks due to the cancer that he also has. His workloads may need to be reevaluated upon his return.   Exercise workloads will need to be reviewed upon his return based on his exercise capacity.    Duration  Progress to 30 minutes of continuous aerobic without signs/symptoms of physical distress Progress to 30 minutes of continuous aerobic without signs/symptoms of physical distress Progress to 30 minutes of continuous aerobic without signs/symptoms of physical distress Progress to 30 minutes of continuous aerobic without signs/symptoms of physical distress   Intensity  Rest + 30 Rest + 30 Rest + 30 Rest + 30   Progression  Continue progressive overload as per policy without signs/symptoms or physical distress. Continue progressive overload as per policy without signs/symptoms or physical  distress. Continue progressive overload as per policy without signs/symptoms or physical distress. Continue progressive overload as per policy without signs/symptoms or physical distress.   Resistance Training   Training Prescription  Yes Yes Yes Yes   Weight  2 2 2 2    Reps  10-15 10-15 10-15 10-15   Interval Training   Interval Training  No No No No   Treadmill   MPH  1.5 1.5 1.5 1.5   Grade  0 0 0 0   Minutes  10 10 10 10    REL-XR   Level  2 2 2 2    Watts  40 40 40 40   Minutes  15 15 15 15       Discharge Exercise Prescription (Final Exercise Prescription Changes):     Exercise Prescription Changes - 11/13/15 0700    Exercise Review   Progression No  Absent since last review; last visit 08/06/15   Response to Exercise   Comments Exercise workloads will need to be reviewed upon his return based on his exercise capacity.    Duration Progress to 30 minutes of continuous aerobic without signs/symptoms of physical distress   Intensity Rest + 30   Progression Continue progressive overload as per policy without signs/symptoms or physical distress.   Resistance Training   Training Prescription Yes   Weight 2   Reps 10-15   Interval Training   Interval  Training No   Treadmill   MPH 1.5   Grade 0   Minutes 10   REL-XR   Level 2   Watts 40   Minutes 15      Nutrition:  Target Goals: Understanding of nutrition guidelines, daily intake of sodium 1500mg , cholesterol 200mg , calories 30% from fat and 7% or less from saturated fats, daily to have 5 or more servings of fruits and vegetables.  Biometrics:     Pre Biometrics - 07/31/15 1136    Pre Biometrics   Height 5' 10.5" (1.791 m)   Weight 203 lb 4.8 oz (92.216 kg)   Waist Circumference 40.25 inches   Hip Circumference 44.5 inches   Waist to Hip Ratio 0.9 %   BMI (Calculated) 28.8       Nutrition Therapy Plan and Nutrition Goals:   Nutrition Discharge: Rate Your Plate Scores:   Nutrition Goals  Re-Evaluation:     Nutrition Goals Re-Evaluation      08/23/15 1536 10/09/15 1333         Personal Goal #1 Re-Evaluation   Personal Goal #1 Bayro has been out of Cardiac Rehab since 08/06/2015 since he told Roanna Epley that "exercising is antogonizing his tumor/cancer".  Onaje called [place] and said he wants to return to Cardiac REhab in about a month. He is currently getting a cancer treatment today. [place] has nutritionist.         Psychosocial: Target Goals: Acknowledge presence or absence of depression, maximize coping skills, provide positive support system. Participant is able to verbalize types and ability to use techniques and skills needed for reducing stress and depression.  Initial Review & Psychosocial Screening:     Initial Psych Review & Screening - 08/01/15 [place]? Yes   Screening Interventions   Interventions Encouraged to exercise;Program counselor consult      Quality of Life Scores:     Quality of Life - 08/01/15 1208    Quality of Life Scores   Health/Function Pre 19.87 %   Socioeconomic Pre 24.21 %   Psych/Spiritual Pre 24 %   Family Pre 24.8 %   GLOBAL Pre 22.93 %      PHQ-9:     Recent Review Flowsheet Data    Depression screen [facility] 2/9 07/31/2015 05/03/2015   Decreased Interest 0 0   Down, Depressed, Hopeless 0 0   PHQ - 2 Score 0 0   Altered sleeping 3 -   Tired, decreased energy 1 -   Change in appetite 0 -   Feeling bad or failure about yourself  0 -   Trouble concentrating 0 -   Moving slowly or fidgety/restless 1 -   Suicidal thoughts 0 -   PHQ-9 Score 5 -   Difficult doing work/chores Not difficult at all -      Psychosocial Evaluation and Intervention:   Psychosocial Re-Evaluation:     Psychosocial Re-Evaluation      08/23/15 1537 08/29/15 1157 10/09/15 1342       Psychosocial Re-Evaluation   Interventions Encouraged to attend Cardiac Rehabilitation for the exercise  Stress management education      Comments Many stressors including -Majesty has been out of Cardiac Rehab since 08/06/2015 since he told Roanna Epley that "exercising is antogonizing his tumor/cancer".  Sharief called [place] and said he wants to return to Cardiac REhab in about a month. He is currently getting a cancer treatment today. [facility] has a counselor for their patients also. Brayde was also reported he was in the hospital for Blood clot        Vocational Rehabilitation: Provide vocational rehab assistance to qualifying candidates.   Vocational Rehab Evaluation & Intervention:     Vocational Rehab - 07/31/15 1102    Initial Vocational Rehab Evaluation & Intervention   Assessment shows need for Vocational Rehabilitation No      Education: Education Goals: Education classes will be provided on a weekly basis, covering required topics. Participant will state understanding/return demonstration of topics presented.  Learning Barriers/Preferences:     Learning Barriers/Preferences - 07/31/15 1102    Learning Barriers/Preferences   Learning Barriers None   Learning Preferences None      Education Topics: General Nutrition Guidelines/Fats and Fiber: -Group instruction provided by verbal, written material, models and posters to present the general guidelines for heart healthy nutrition. Gives an explanation and review of dietary fats and fiber.   Controlling Sodium/Reading Food Labels: -Group verbal and written material supporting the discussion of sodium use in heart healthy nutrition. Review and explanation with models, verbal and written materials for utilization of the food label.   Exercise Physiology & Risk Factors: - Group verbal and written instruction with models to review the exercise physiology of the cardiovascular system and associated critical values. Details cardiovascular disease risk factors and the goals associated with each risk factor.   Aerobic  Exercise & Resistance Training: - Gives group verbal and written discussion on the health impact of inactivity. On the components of aerobic and resistive training programs and the benefits of this training and how to safely progress through these programs.   Flexibility, Balance, General Exercise Guidelines: - Provides group verbal and written instruction on the benefits of flexibility and balance training programs. Provides general exercise guidelines with specific guidelines to those with heart or lung disease. Demonstration and skill practice provided.          Cardiac Rehab from 08/06/2015 in [facility] Cardiac and Pulmonary Rehab   Date  08/06/15   Educator  [facility]   Instruction Review Code  2- meets goals/outcomes      Stress Management: - Provides group verbal and written instruction about the health risks of elevated stress, cause of high stress, and healthy ways to reduce stress.   Depression: - Provides group verbal and written instruction on the correlation between heart/lung disease and depressed mood, treatment options, and the stigmas associated with seeking treatment.   Anatomy & Physiology of the Heart: - Group verbal and written instruction and models provide basic cardiac anatomy and physiology, with the coronary electrical and arterial systems. Review of: AMI, Angina, Valve disease, Heart Failure, Cardiac Arrhythmia, Pacemakers, and the ICD.   Cardiac Procedures: - Group verbal and written instruction and models to describe the testing methods done to diagnose heart disease. Reviews the outcomes of the test results. Describes the treatment choices: Medical Management, Angioplasty, or Coronary Bypass Surgery.   Cardiac Medications: - Group verbal and written instruction to review commonly prescribed medications for heart disease. Reviews the medication, class of the drug, and side effects. Includes the steps to properly store meds and maintain the prescription regimen.   Go  Sex-Intimacy & Heart Disease, Get SMART - Goal Setting: - Group verbal and written instruction through game format to discuss heart disease and the return to sexual intimacy. Provides group verbal and written material to discuss and apply goal setting through the application of the S.M.A.R.T. Method.  Other Matters of the Heart: - Provides group verbal, written materials and models to describe Heart Failure, Angina, Valve Disease, and Diabetes in the realm of heart disease. Includes description of the disease process and treatment options available to the cardiac patient.   Exercise & Equipment Safety: - Individual verbal instruction and demonstration of equipment use and safety with use of the equipment.      Cardiac Rehab from 08/06/2015 in [facility] Cardiac and Pulmonary Rehab   Date  08/06/15   Educator  RM   Instruction Review Code  2- meets goals/outcomes      Infection Prevention: - Provides verbal and written material to individual with discussion of infection control including proper hand washing and proper equipment cleaning during exercise session.      Cardiac Rehab from 08/06/2015 in [facility] Cardiac and Pulmonary Rehab   Date  08/06/15   Educator  RM   Instruction Review Code  2- meets goals/outcomes      Falls Prevention: - Provides verbal and written material to individual with discussion of falls prevention and safety.   Diabetes: - Individual verbal and written instruction to review signs/symptoms of diabetes, desired ranges of glucose level fasting, after meals and with exercise. Advice that pre and post exercise glucose checks will be done for 3 sessions at entry of program.      Cardiac Rehab from 08/06/2015 in [facility] Cardiac and Pulmonary Rehab   Date  07/31/15   Educator  SB   Instruction Review Code  2- meets goals/outcomes       Knowledge Questionnaire Score:     Knowledge Questionnaire Score - 08/01/15 1209    Knowledge Questionnaire Score   Pre Score 22/28       Personal Goals and Risk Factors at Admission:     Personal Goals and Risk Factors at Admission - 07/31/15 1103    Personal Goals and Risk Factors on Admission   Increase Aerobic Exercise and Physical Activity Yes   Intervention While in program, learn and follow the exercise prescription taught. Start at a low level workload and increase workload after able to maintain previous level for 30 minutes. Increase time before increasing intensity.   Understand more about Heart/Pulmonary Disease. Yes   Intervention While in program utilize professionals for any questions, and attend the education sessions. Great websites to use are www.americanheart.org or www.lung.org for reliable information.   Diabetes Yes   Goal Blood glucose control identified by blood glucose values, HgbA1C. Participant verbalizes understanding of the signs/symptoms of hyper/hypo glycemia, proper foot care and importance of medication and nutrition plan for blood glucose control.   Intervention Provide nutrition & aerobic exercise along with prescribed medications to achieve blood glucose in normal ranges: Fasting 65-99 mg/dL   Hypertension Yes   Goal Participant will see blood pressure controlled within the values of 140/73mm/Hg or within value directed by their physician.   Intervention Provide nutrition & aerobic exercise along with prescribed medications to achieve BP 140/90 or less.   Lipids Yes   Goal Cholesterol controlled with medications as prescribed, with individualized exercise RX and with personalized nutrition plan. Value goals: LDL < 70mg , HDL > 40mg . Participant states understanding of desired cholesterol values and following prescriptions.   Intervention Provide nutrition & aerobic exercise along with prescribed medications to achieve LDL 70mg , HDL >40mg .      Personal Goals and Risk Factors Review:      Goals and Risk Factor Review      08/23/15 1537 08/29/15  1157 10/09/15 1340       Increase  Aerobic Exercise and Physical Activity   Goals Progress/Improvement seen  No No No     Comments Gentry has been out of Cardiac Rehab since 08/06/2015 since he told Roanna Epley that "exercising is antogonizing his tumor/cancer".  Anyelo called [place] and said he wants to return to Cardiac REhab in about a month. He is currently getting a cancer treatment today. [place] has nutritionist. Cairon was also reported he was in the hospital for Blood clots.     Diabetes   Goal --  Bensyn has been out of Cardiac Rehab since 08/06/2015 since he told Roanna Epley that "exercising is antogonizing his tumor/cancer".  --  See [place] lab work. Has been out since Sept from Cardiac Rehab.      Hypertension   Goal --  Dalton has been out of Cardiac Rehab since 08/06/2015 since he told Roanna Epley that "exercising is antogonizing his tumor/cancer".  --  Kaivon called [place] and said he wants to return to Cardiac REhab in about a month. He is currently getting a cancer treatment today. [facility] has nutritionist. [place] was also reported he was in the hospital for Blood clot     Abnormal Lipids   Goal   --  Kaulder called [place] and said he wants to return to Cardiac REhab in about a month. He is currently getting a cancer treatment today. [place] has nutritionist. Juwan was also reported he was in the hospital for Blood clot        Personal Goals Discharge (Final Personal Goals and Risk Factors Review):      Goals and Risk Factor Review - 10/09/15 1340    Increase Aerobic Exercise and Physical Activity   Goals Progress/Improvement seen  No   Comments Lathen called [place] and said he wants to return to Cardiac REhab in about a month. He is currently getting a cancer treatment today. [place] has nutritionist. Griff was also reported he was in the hospital for Blood clots.   Diabetes   Goal --  See [place] lab work. Has been out since Sept from Cardiac Rehab.     Hypertension   Goal --  [place] called [place] and said he wants to return to Cardiac REhab in about a month. He is currently getting a cancer treatment today. [place] has nutritionist. Jyrin was also reported he was in the hospital for Blood clot   Abnormal Lipids   Goal --  Deymar called [place] and said he wants to return to Cardiac REhab in about a month. He is currently getting a cancer treatment today. [place] has nutritionist. Shorty was also reported he was in the hospital for Blood clot      ITP Comments:     ITP Comments      10/22/15 1216 11/18/15 1225         ITP Comments 30 day review preparation: Continue with ITP Ready for 30 day review.  Continue with ITP  Has only attended 4 sessions. Last on 08/06/2015. Has been out with medical concerns. Plans to return Jan 4.          Comments:

## 2015-11-20 ENCOUNTER — Telehealth: Payer: Self-pay | Admitting: *Deleted

## 2015-11-20 NOTE — Telephone Encounter (Signed)
I left a message with Dr. Laurelyn Sickle office regarding need for clearance for patient to discontinue Plavix and Eliquis prior to CT Guided Lung Biopsy, office to call back once they have spoken with Dr. Humphrey Rolls.

## 2015-11-21 ENCOUNTER — Encounter: Payer: PPO | Attending: Cardiovascular Disease

## 2015-11-21 ENCOUNTER — Telehealth: Payer: Self-pay | Admitting: *Deleted

## 2015-11-21 DIAGNOSIS — Z9861 Coronary angioplasty status: Secondary | ICD-10-CM | POA: Diagnosis present

## 2015-11-21 DIAGNOSIS — I214 Non-ST elevation (NSTEMI) myocardial infarction: Secondary | ICD-10-CM | POA: Insufficient documentation

## 2015-11-21 LAB — GLUCOSE, CAPILLARY
Glucose-Capillary: 167 mg/dL — ABNORMAL HIGH (ref 65–99)
Glucose-Capillary: 180 mg/dL — ABNORMAL HIGH (ref 65–99)

## 2015-11-21 NOTE — Addendum Note (Signed)
Addended by: Lynford Humphrey on: 11/21/2015 10:38 AM   Modules accepted: Orders

## 2015-11-21 NOTE — Telephone Encounter (Signed)
[facility] contacted Triage. Patient needs to stop his Eliquis 48 hours prior and Plavix 5 days prior days to the CT guided biopsy.

## 2015-11-21 NOTE — Progress Notes (Signed)
Daily Session Note  Patient Details  Name: Louis Alexander MRN: [id number] Date of Birth: 05-31-1941 Referring Provider:  Dionisio David, MD  Encounter Date: 11/21/2015  Check In:     Session Check In - 11/21/15 0831    Check-In   Staff Present Heath Lark, RN, BSN, CCRP;Renee Dillard [place], MS, ACSM CEP, Exercise Physiologist;Dorna Mallet, BS, ACSM EP-C, Exercise Physiologist   ER physicians immediately available to respond to emergencies See telemetry face sheet for immediately available ER MD   Medication changes reported     Yes   Fall or balance concerns reported    No   Warm-up and Cool-down Performed on first and last piece of equipment   VAD Patient? No   Pain Assessment   Currently in Pain? No/denies         Goals Met:  Proper associated with RPD/PD & O2 Sat Exercise tolerated well No report of cardiac concerns or symptoms Strength training completed today  Goals Unmet:  Not Applicable  Goals Comments:   Dr. Emily Filbert is Medical Director for [facility] and [facility].

## 2015-11-23 ENCOUNTER — Other Ambulatory Visit: Payer: Self-pay | Admitting: Radiology

## 2015-11-23 ENCOUNTER — Encounter: Payer: PPO | Admitting: *Deleted

## 2015-11-23 DIAGNOSIS — I214 Non-ST elevation (NSTEMI) myocardial infarction: Secondary | ICD-10-CM | POA: Diagnosis not present

## 2015-11-23 DIAGNOSIS — Z9861 Coronary angioplasty status: Secondary | ICD-10-CM

## 2015-11-23 LAB — GLUCOSE, CAPILLARY: Glucose-Capillary: 121 mg/dL — ABNORMAL HIGH (ref 65–99)

## 2015-11-23 NOTE — Addendum Note (Signed)
Addended by: Lynford Humphrey on: 11/23/2015 09:58 AM   Modules accepted: Medications

## 2015-11-23 NOTE — Progress Notes (Signed)
Daily Session Note  Patient Details  Name: Louis Alexander MRN: [id number] Date of Birth: [birth year] Referring Provider:  Dionisio David, MD  Encounter Date: 11/23/2015  Check In:     Session Check In - 11/23/15 0944    Check-In   Staff Present Heath Lark, RN, BSN, CCRP;Carroll Enterkin, RN, Drusilla Kanner, MS, ACSM CEP, Exercise Physiologist   ER physicians immediately available to respond to emergencies See telemetry face sheet for immediately available ER MD   Medication changes reported     No   Fall or balance concerns reported    No   Warm-up and Cool-down Performed on first and last piece of equipment   VAD Patient? No   Pain Assessment   Currently in Pain? No/denies         Goals Met:  Exercise tolerated well No report of cardiac concerns or symptoms Strength training completed today  Goals Unmet:  Not Applicable  Goals Comments: [name] did well after first day back on Wed. He was tired today, so was advised to slow down and take rest breaks as needed.    Dr. Emily Filbert is Medical Director for [place] and [facility].

## 2015-11-26 ENCOUNTER — Ambulatory Visit
Admission: RE | Admit: 2015-11-26 | Discharge: 2015-11-26 | Disposition: A | Discharge status: Home / Self Care | Payer: PPO | Source: Ambulatory Visit | Attending: Oncology | Admitting: Oncology

## 2015-11-26 MED ORDER — SODIUM CHLORIDE 0.9 % IV SOLN: Freq: Once | INTRAVENOUS | Status: DC

## 2015-11-30 ENCOUNTER — Encounter: Payer: PPO | Admitting: *Deleted

## 2015-11-30 ENCOUNTER — Other Ambulatory Visit: Payer: Self-pay | Admitting: Physician Assistant

## 2015-11-30 DIAGNOSIS — Z9861 Coronary angioplasty status: Secondary | ICD-10-CM

## 2015-11-30 DIAGNOSIS — I214 Non-ST elevation (NSTEMI) myocardial infarction: Secondary | ICD-10-CM | POA: Diagnosis not present

## 2015-11-30 NOTE — Progress Notes (Signed)
Daily Session Note  Patient Details  Name: FAREED FUNG MRN: [id number] Date of Birth: 1941/08/16 Referring Provider:  Dionisio David, MD  Encounter Date: 11/30/2015  Check In:     Session Check In - 11/30/15 1159    Check-In   Staff Present Heath Lark, RN, BSN, CCRP;Stacey Blanch Media, RRT, RCP, Respiratory Therapist;Renee Dillard Essex, MS, ACSM CEP, Exercise Physiologist   ER physicians immediately available to respond to emergencies See telemetry face sheet for immediately available ER MD   Medication changes reported     No   Fall or balance concerns reported    No   Warm-up and Cool-down Performed on first and last piece of equipment   VAD Patient? No   Pain Assessment   Currently in Pain? No/denies         Goals Met:  Exercise tolerated well No report of cardiac concerns or symptoms Strength training completed today  Goals Unmet:  Not Applicable  Goals Comments: [name] is progressing well with exercise prescription since returning to program after chemo.   Dr. Emily Filbert is Medical Director for [place] and [facility].

## 2015-12-03 ENCOUNTER — Ambulatory Visit
Admission: RE | Admit: 2015-12-03 | Discharge: 2015-12-03 | Disposition: A | Discharge status: Home / Self Care | Payer: PPO | Source: Ambulatory Visit | Attending: Oncology | Admitting: Oncology

## 2015-12-03 ENCOUNTER — Ambulatory Visit
Admission: RE | Admit: 2015-12-03 | Discharge: 2015-12-03 | Disposition: A | Discharge status: Home / Self Care | Payer: PPO | Source: Ambulatory Visit | Attending: Diagnostic Radiology | Admitting: Diagnostic Radiology

## 2015-12-03 DIAGNOSIS — E119 Type 2 diabetes mellitus without complications: Secondary | ICD-10-CM | POA: Insufficient documentation

## 2015-12-03 DIAGNOSIS — Z8550 Personal history of malignant neoplasm of unspecified urinary tract organ: Secondary | ICD-10-CM | POA: Insufficient documentation

## 2015-12-03 DIAGNOSIS — I10 Essential (primary) hypertension: Secondary | ICD-10-CM | POA: Diagnosis not present

## 2015-12-03 DIAGNOSIS — E669 Obesity, unspecified: Secondary | ICD-10-CM | POA: Insufficient documentation

## 2015-12-03 DIAGNOSIS — E785 Hyperlipidemia, unspecified: Secondary | ICD-10-CM | POA: Insufficient documentation

## 2015-12-03 DIAGNOSIS — Z01812 Encounter for preprocedural laboratory examination: Secondary | ICD-10-CM | POA: Diagnosis present

## 2015-12-03 DIAGNOSIS — Z9889 Other specified postprocedural states: Secondary | ICD-10-CM | POA: Insufficient documentation

## 2015-12-03 DIAGNOSIS — G4733 Obstructive sleep apnea (adult) (pediatric): Secondary | ICD-10-CM | POA: Diagnosis not present

## 2015-12-03 DIAGNOSIS — Z923 Personal history of irradiation: Secondary | ICD-10-CM | POA: Insufficient documentation

## 2015-12-03 DIAGNOSIS — R918 Other nonspecific abnormal finding of lung field: Secondary | ICD-10-CM | POA: Diagnosis not present

## 2015-12-03 DIAGNOSIS — I2510 Atherosclerotic heart disease of native coronary artery without angina pectoris: Secondary | ICD-10-CM | POA: Diagnosis not present

## 2015-12-03 LAB — BASIC METABOLIC PANEL WITH GFR
Anion gap: 10 (ref 5–15)
BUN: 27 mg/dL — ABNORMAL HIGH (ref 6–20)
CO2: 24 mmol/L (ref 22–32)
Calcium: 9.3 mg/dL (ref 8.9–10.3)
Chloride: 102 mmol/L (ref 101–111)
Creatinine, Ser: 1.78 mg/dL — ABNORMAL HIGH (ref 0.61–1.24)
GFR calc Af Amer: 42 mL/min — ABNORMAL LOW
GFR calc non Af Amer: 36 mL/min — ABNORMAL LOW
Glucose, Bld: 118 mg/dL — ABNORMAL HIGH (ref 65–99)
Potassium: 4.2 mmol/L (ref 3.5–5.1)
Sodium: 136 mmol/L (ref 135–145)

## 2015-12-03 LAB — PROTIME-INR
INR: 1.01
PROTHROMBIN TIME: 13.5 s (ref 11.4–15.0)

## 2015-12-03 LAB — CBC
HCT: 32.1 % — ABNORMAL LOW (ref 40.0–52.0)
Hemoglobin: 10.4 g/dL — ABNORMAL LOW (ref 13.0–18.0)
MCH: 29.4 pg (ref 26.0–34.0)
MCHC: 32.5 g/dL (ref 32.0–36.0)
MCV: 90.5 fL (ref 80.0–100.0)
Platelets: 220 10*3/uL (ref 150–440)
RBC: 3.55 MIL/uL — ABNORMAL LOW (ref 4.40–5.90)
RDW: 19.1 % — AB (ref 11.5–14.5)
WBC: 10.5 10*3/uL (ref 3.8–10.6)

## 2015-12-03 LAB — APTT: aPTT: 30 s (ref 24–36)

## 2015-12-03 MED ORDER — MIDAZOLAM HCL 5 MG/5ML IJ SOLN
INTRAMUSCULAR | Status: AC
  Filled 2015-12-03: qty 5

## 2015-12-03 MED ORDER — FENTANYL CITRATE (PF) 100 MCG/2ML IJ SOLN
INTRAMUSCULAR | Status: AC
  Filled 2015-12-03: qty 2

## 2015-12-03 MED ORDER — SODIUM CHLORIDE 0.9 % IV SOLN
INTRAVENOUS | Status: DC
  Administered 2015-12-03: 12:00:00 via INTRAVENOUS

## 2015-12-03 MED ORDER — BUTAMBEN-TETRACAINE-BENZOCAINE 2-2-14 % EX AERO
INHALATION_SPRAY | CUTANEOUS | Status: AC
  Filled 2015-12-03: qty 20

## 2015-12-03 MED ORDER — FENTANYL CITRATE (PF) 100 MCG/2ML IJ SOLN
INTRAMUSCULAR | Status: AC | PRN
  Administered 2015-12-03: 50 ug via INTRAVENOUS

## 2015-12-03 MED ORDER — HYDROCODONE-ACETAMINOPHEN 5-325 MG PO TABS: 1.0000 | ORAL_TABLET | ORAL | Status: DC | PRN

## 2015-12-03 MED ORDER — MIDAZOLAM HCL 5 MG/5ML IJ SOLN
INTRAMUSCULAR | Status: AC | PRN
  Administered 2015-12-03: 1 mg via INTRAVENOUS

## 2015-12-03 MED ORDER — HEPARIN SOD (PORK) LOCK FLUSH 10 UNIT/ML IV SOLN
INTRAVENOUS | Status: AC
  Filled 2015-12-03: qty 1

## 2015-12-03 NOTE — OR Nursing (Signed)
Pt had CXR as ordered and per radiology nurse who spoke with Dr. Nyoka Cowden, pt is clear for discharge. Port flushed by S. BrownRN per hospital protocal.

## 2015-12-03 NOTE — Procedures (Signed)
Under CT guidance, biopsy of LUL mass was performed. CXR to follow.

## 2015-12-03 NOTE — Procedures (Signed)
Procedure and risks (including pneumothorax requiring chest tube placement) were discussed with patient. Informed consent was obtained.

## 2015-12-04 LAB — SURGICAL PATHOLOGY

## 2015-12-05 DIAGNOSIS — I214 Non-ST elevation (NSTEMI) myocardial infarction: Secondary | ICD-10-CM | POA: Diagnosis not present

## 2015-12-05 DIAGNOSIS — Z9861 Coronary angioplasty status: Secondary | ICD-10-CM

## 2015-12-05 NOTE — Progress Notes (Signed)
Daily Session Note  Patient Details  Name: Louis Alexander MRN: [id number] Date of Birth: Jul 02, 1941 Referring Provider:  Dionisio David, MD  Encounter Date: 12/05/2015  Check In:     Session Check In - 12/05/15 0911    Check-In   Staff Present Heath Lark, RN, BSN, CCRP;Renee Dillard Essex, MS, ACSM CEP, Exercise Physiologist;Bernard Slayden, BS, ACSM EP-C, Exercise Physiologist   ER physicians immediately available to respond to emergencies See telemetry face sheet for immediately available ER MD   Medication changes reported     No   Fall or balance concerns reported    No   Warm-up and Cool-down Performed on first and last piece of equipment   VAD Patient? No   Pain Assessment   Currently in Pain? No/denies         Goals Met:  Proper associated with RPD/PD & O2 Sat Exercise tolerated well No report of cardiac concerns or symptoms Strength training completed today  Goals Unmet:  Not Applicable  Goals Comments:    Dr. Emily Filbert is Medical Director for [place] and [facility].

## 2015-12-07 ENCOUNTER — Encounter: Payer: PPO | Admitting: *Deleted

## 2015-12-07 DIAGNOSIS — I214 Non-ST elevation (NSTEMI) myocardial infarction: Secondary | ICD-10-CM | POA: Diagnosis not present

## 2015-12-07 DIAGNOSIS — Z9861 Coronary angioplasty status: Secondary | ICD-10-CM

## 2015-12-07 NOTE — Progress Notes (Signed)
Daily Session Note  Patient Details  Name: Louis Alexander MRN: [id number] Date of Birth: [birth year] Referring Provider:  Allyne Gee, MD  Encounter Date: 12/07/2015  Check In:     Session Check In - 12/07/15 0911    Check-In   Staff Present Gerlene Burdock, RN, Drusilla Kanner, MS, ACSM CEP, Exercise Physiologist;Susanne Bice, RN, BSN, [facility]   ER physicians immediately available to respond to emergencies See telemetry face sheet for immediately available ER MD   Medication changes reported     No   Fall or balance concerns reported    No   Warm-up and Cool-down Performed on first and last piece of equipment   VAD Patient? No   Pain Assessment   Currently in Pain? No/denies         Goals Met:  Proper associated with RPD/PD & O2 Sat Exercise tolerated well  Goals Unmet:  Not Applicable  Goals Comments:    Dr. Emily Filbert is Medical Director for [employer] and [facility].

## 2015-12-11 ENCOUNTER — Inpatient Hospital Stay: Payer: PPO | Attending: Oncology | Admitting: Oncology

## 2015-12-11 VITALS — BP 145/102 | HR 61 | Temp 97.9°F | Resp 16 | Wt 189.8 lb

## 2015-12-11 DIAGNOSIS — Z79899 Other long term (current) drug therapy: Secondary | ICD-10-CM | POA: Insufficient documentation

## 2015-12-11 DIAGNOSIS — E785 Hyperlipidemia, unspecified: Secondary | ICD-10-CM | POA: Insufficient documentation

## 2015-12-11 DIAGNOSIS — I10 Essential (primary) hypertension: Secondary | ICD-10-CM

## 2015-12-11 DIAGNOSIS — Z8546 Personal history of malignant neoplasm of prostate: Secondary | ICD-10-CM | POA: Diagnosis not present

## 2015-12-11 DIAGNOSIS — E1165 Type 2 diabetes mellitus with hyperglycemia: Secondary | ICD-10-CM | POA: Diagnosis not present

## 2015-12-11 DIAGNOSIS — C689 Malignant neoplasm of urinary organ, unspecified: Secondary | ICD-10-CM

## 2015-12-11 DIAGNOSIS — I4891 Unspecified atrial fibrillation: Secondary | ICD-10-CM | POA: Insufficient documentation

## 2015-12-11 DIAGNOSIS — C680 Malignant neoplasm of urethra: Secondary | ICD-10-CM

## 2015-12-11 DIAGNOSIS — Z923 Personal history of irradiation: Secondary | ICD-10-CM

## 2015-12-11 DIAGNOSIS — R5383 Other fatigue: Secondary | ICD-10-CM

## 2015-12-11 DIAGNOSIS — G4730 Sleep apnea, unspecified: Secondary | ICD-10-CM | POA: Insufficient documentation

## 2015-12-11 DIAGNOSIS — D649 Anemia, unspecified: Secondary | ICD-10-CM | POA: Insufficient documentation

## 2015-12-11 DIAGNOSIS — F419 Anxiety disorder, unspecified: Secondary | ICD-10-CM | POA: Insufficient documentation

## 2015-12-11 DIAGNOSIS — R531 Weakness: Secondary | ICD-10-CM | POA: Diagnosis not present

## 2015-12-11 DIAGNOSIS — Z7984 Long term (current) use of oral hypoglycemic drugs: Secondary | ICD-10-CM | POA: Diagnosis not present

## 2015-12-11 DIAGNOSIS — E669 Obesity, unspecified: Secondary | ICD-10-CM | POA: Diagnosis not present

## 2015-12-11 DIAGNOSIS — I252 Old myocardial infarction: Secondary | ICD-10-CM | POA: Insufficient documentation

## 2015-12-11 DIAGNOSIS — C787 Secondary malignant neoplasm of liver and intrahepatic bile duct: Secondary | ICD-10-CM

## 2015-12-11 DIAGNOSIS — Z86711 Personal history of pulmonary embolism: Secondary | ICD-10-CM | POA: Diagnosis not present

## 2015-12-11 DIAGNOSIS — M129 Arthropathy, unspecified: Secondary | ICD-10-CM | POA: Diagnosis not present

## 2015-12-11 DIAGNOSIS — Z9221 Personal history of antineoplastic chemotherapy: Secondary | ICD-10-CM | POA: Diagnosis not present

## 2015-12-11 DIAGNOSIS — Z7982 Long term (current) use of aspirin: Secondary | ICD-10-CM | POA: Diagnosis not present

## 2015-12-11 DIAGNOSIS — I2510 Atherosclerotic heart disease of native coronary artery without angina pectoris: Secondary | ICD-10-CM

## 2015-12-11 DIAGNOSIS — R918 Other nonspecific abnormal finding of lung field: Secondary | ICD-10-CM | POA: Diagnosis not present

## 2015-12-11 MED ORDER — PROCHLORPERAZINE MALEATE 10 MG PO TABS: 10.0000 mg | ORAL_TABLET | Freq: Four times a day (QID) | ORAL | Status: DC | PRN

## 2015-12-11 NOTE — Progress Notes (Signed)
Patient is feeling very anxious about results today.  His pain has returned and is now using the patch again, pain today is 4/10 on pain scale.

## 2015-12-12 DIAGNOSIS — Z9861 Coronary angioplasty status: Secondary | ICD-10-CM

## 2015-12-12 DIAGNOSIS — I214 Non-ST elevation (NSTEMI) myocardial infarction: Secondary | ICD-10-CM

## 2015-12-12 NOTE — Progress Notes (Signed)
Daily Session Note  Patient Details  Name: LISANDRO MEGGETT MRN: [id number] Date of Birth: December 23, 1940 Referring Provider:  Dionisio David, MD  Encounter Date: 12/12/2015  Check In:     Session Check In - 12/12/15 0819    Check-In   Staff Present Heath Lark, RN, BSN, CCRP;Mary Kellie Shropshire, RN;Natha Guin, BS, ACSM EP-C, Exercise Physiologist   ER physicians immediately available to respond to emergencies See telemetry face sheet for immediately available ER MD   Medication changes reported     No   Fall or balance concerns reported    No   Warm-up and Cool-down Performed on first and last piece of equipment   VAD Patient? No   Pain Assessment   Currently in Pain? No/denies         Goals Met:  Proper associated with RPD/PD & O2 Sat Exercise tolerated well No report of cardiac concerns or symptoms Strength training completed today  Goals Unmet:  Not Applicable  Goals Comments:    Dr. Emily Filbert is Medical Director for [place] and [facility].

## 2015-12-13 NOTE — Progress Notes (Signed)
[place]  [phone]) [phone] Fax:([phone]) [phone]  ID: Louis Alexander OB: September 05, 1941  MR#: [id number]  [id number]  Patient Care Team: Birdie Sons, MD as PCP - General (Family Medicine) Murrell Redden, MD (Urology) Dionisio David, MD as Consulting Physician (Cardiology) Lloyd Huger, MD as Consulting Physician (Oncology)  CHIEF COMPLAINT:  Chief Complaint  Patient presents with  . Results    INTERVAL HISTORY: Patient returns to clinic for further evaluation and discussion of his biopsy results. His pain has increased and is now 4/10.  He has replaced his fentanyl patch. He is highly anxious.  He does not complain of chest pain or shortness of breath. He has no neurologic complaints. Patient denies any fevers. He denies any nausea, vomiting, constipation, or diarrhea. Patient offers no further specific complaints today.   REVIEW OF SYSTEMS:   Review of Systems  Constitutional: Positive for malaise/fatigue. Negative for fever.  Respiratory: Negative.  Negative for shortness of breath.   Cardiovascular: Negative.  Negative for chest pain.  Gastrointestinal: Positive for abdominal pain. Negative for nausea.  Genitourinary: Positive for flank pain.  Musculoskeletal: Negative.   Neurological: Negative.   Psychiatric/Behavioral: The patient is nervous/anxious.     As per HPI. Otherwise, a complete review of systems is negatve.  PAST MEDICAL HISTORY: Past Medical History  Diagnosis Date  . Abnormal EKG   . Coronary artery disease   . Diabetes mellitus without complication ([place])   . Obesity   . Hyperlipidemia   . Hypertension   . Sleep apnea     could not tolerate cpap mask  . Arthritis     oa  . Cancer [facility])     bladder and prostate  . History of radiation therapy 2012  . History of chemotherapy 2014  . Bladder cancer ([place])   . History of chicken pox   . Myocardial infarction ([place]) 2008 and 2012    x 2    PAST SURGICAL  HISTORY: Past Surgical History  Procedure Laterality Date  . Cardiac catheterization  12-06-2003  . Lad stent  2000 x1 stent, 2008 x 1 stent, 2012 x 1 stent  . Hernia repair  yrs ago  . Appendectomy  many yrs ago  . Rotator cuff repair Left yrs ago  . Seed implant to prostate with radiation  2012  . Protatectomy and urostomy  2014  . Total hip arthroplasty Right 12/27/2013    Procedure: RIGHT TOTAL HIP ARTHROPLASTY ANTERIOR APPROACH;  Surgeon: Mauri Pole, MD;  Location: [facility];  Service: Orthopedics;  Laterality: Right;  . Cardiac catheterization N/A 06/04/2015    Procedure: Left Heart Cath and Coronary Angiography;  Surgeon: Corey Skains, MD;  Location: [place] CV LAB;  Service: Cardiovascular;  Laterality: N/A;  . Cardiac catheterization N/A 06/04/2015    Procedure: Coronary Stent Intervention;  Surgeon: Wellington Hampshire, MD;  Location: [place] CV LAB;  Service: Cardiovascular;  Laterality: N/A;  . Peripheral vascular catheterization N/A 09/25/2015    Procedure: IVC Filter Insertion;  Surgeon: Katha Cabal, MD;  Location: [place] CV LAB;  Service: Cardiovascular;  Laterality: N/A;    FAMILY HISTORY: Reviewed and unchanged. No report of malignancy or chronic disease.     ADVANCED DIRECTIVES:    HEALTH MAINTENANCE: Social History  Substance Use Topics  . Smoking status: Never Smoker   . Smokeless tobacco: Never Used  . Alcohol Use: 0.0 oz/week    0 Standard drinks or equivalent per week  Comment: occasional     Colonoscopy:  PAP:  Bone density:  Lipid panel:  Allergies  Allergen Reactions  . Celebrex [Celecoxib]     Hematuria Other reaction(s): Bleeding Bleeding in bladder  . Effient [Prasugrel] Other (See Comments)    blood clots, blood in urine Other reaction(s): Bleeding Bleeding in bladder    Current Outpatient Prescriptions  Medication Sig Dispense Refill  . apixaban (ELIQUIS) 5 MG TABS tablet Take 5 mg by mouth 2 (two) times  daily.    [name] aspirin EC 81 MG tablet Take 81 mg by mouth daily.    [name] atenolol (TENORMIN) 25 MG tablet Take 25 mg by mouth daily.    . canagliflozin (INVOKANA) 300 MG TABS tablet Take 300 mg by mouth daily before breakfast. 30 tablet 3  . chlorproMAZINE (THORAZINE) 10 MG tablet Take 1 tablet (10 mg total) by mouth 3 (three) times daily as needed for hiccoughs. 60 tablet 3  . clopidogrel (PLAVIX) 75 MG tablet Take 1 tablet (75 mg total) by mouth daily. 30 tablet 6  . Coenzyme Q10 10 MG capsule Take 10 mg by mouth.    . fentaNYL (DURAGESIC - DOSED MCG/HR) 75 MCG/HR Place 1 patch (75 mcg total) onto the skin every 3 (three) days. 10 patch 0  . Fluticasone Furoate-Vilanterol (BREO ELLIPTA) 100-25 MCG/INH AEPB Inhale 1 Inhaler into the lungs daily.    . furosemide (LASIX) 20 MG tablet Take 20 mg by mouth 2 (two) times daily.    [name] glipiZIDE (GLUCOTROL) 10 MG tablet TAKE 1 TABLET(S) BY MOUTH DAILY 30 tablet 6  . metFORMIN (GLUCOPHAGE-XR) 500 MG 24 hr tablet 2 (TWO) TABLET, ORAL, TWO TIMES DAILY 120 tablet 11  . Multiple Vitamin (MULTIVITAMIN WITH MINERALS) TABS tablet Take 1 tablet by mouth daily.    . pantoprazole (PROTONIX) 40 MG tablet Take 40 mg by mouth 2 (two) times daily.    . potassium chloride (K-DUR) 10 MEQ tablet Take 10 mEq by mouth daily.    . prasugrel (EFFIENT) 10 MG TABS tablet Take 10 mg by mouth daily. Reported on 12/03/2015    . prochlorperazine (COMPAZINE) 10 MG tablet Take 1 tablet (10 mg total) by mouth every 6 (six) hours as needed. 40 tablet 1  . sotalol (BETAPACE) 80 MG tablet Take by mouth 2 (two) times daily.     No current facility-administered medications for this visit.   Facility-Administered Medications Ordered in Other Visits  Medication Dose Route Frequency Provider Last Rate Last Dose  . chlorhexidine (HIBICLENS) 4 % liquid 4 application  60 mL Topical Once Danae Orleans, PA-C       And  . chlorhexidine (HIBICLENS) 4 % liquid 4 application  60 mL Topical Once  Danae Orleans, PA-C      . sodium chloride 0.9 % injection 10 mL  10 mL Intracatheter PRN Lloyd Huger, MD   10 mL at 05/09/15 0900  . sodium chloride 0.9 % injection 3 mL  3 mL Intravenous PRN Lloyd Huger, MD        OBJECTIVE: Filed Vitals:   12/11/15 1016  BP: 145/102  Pulse: 61  Temp: 97.9 F (36.6 C)  Resp: 16     Body mass index is 27.24 kg/(m^2).    ECOG FS:1 - Symptomatic but completely ambulatory  General: Well-developed, well-nourished, no acute distress. Eyes: anicteric sclera. Lungs: Clear to auscultation bilaterally. Heart: Regular rate and rhythm. No rubs, murmurs, or gallops. Abdomen: Soft, nontender, nondistended. No organomegaly noted, normoactive bowel  sounds. Musculoskeletal: No edema, cyanosis, or clubbing. Neuro: Alert, answering all questions appropriately. Cranial nerves grossly intact. Skin: No rashes or petechiae noted. Psych: Normal affect.    LAB RESULTS:  Lab Results  Component Value Date   NA 136 12/03/2015   K 4.2 12/03/2015   CL 102 12/03/2015   CO2 24 12/03/2015   GLUCOSE 118* 12/03/2015   BUN 27* 12/03/2015   CREATININE 1.78* 12/03/2015   CALCIUM 9.3 12/03/2015   PROT 6.6 10/16/2015   ALBUMIN 3.3* 10/16/2015   AST 22 10/16/2015   ALT 18 10/16/2015   ALKPHOS 77 10/16/2015   BILITOT 0.7 10/16/2015   GFRNONAA 36* 12/03/2015   GFRAA 42* 12/03/2015    Lab Results  Component Value Date   WBC 10.5 12/03/2015   NEUTROABS 4.3 10/16/2015   HGB 10.4* 12/03/2015   HCT 32.1* 12/03/2015   MCV 90.5 12/03/2015   PLT 220 12/03/2015     STUDIES: Dg Chest 1 View  12/04/2015  CLINICAL DATA:  Post lung biopsy EXAM: CHEST 1 VIEW COMPARISON:  12/03/2015 FINDINGS: Cardiomediastinal silhouette is stable. Right IJ Port-A-Cath is unchanged in position. Persistent cavitary mass in left upper lobe laterally. No infiltrate or pulmonary edema. There is no pneumothorax. IMPRESSION: No infiltrate or pulmonary edema. Persistent cavitary nodule  in left upper lobe laterally. No pneumothorax. Electronically Signed   By: Lahoma Crocker M.D.   On: 12/04/2015 08:19   Ct Biopsy  12/03/2015  CLINICAL DATA:  Left upper lobe lung mass. Current history of urothelial carcinoma. EXAM: CT GUIDED core BIOPSY OF left upper lobe lung mass. ANESTHESIA/SEDATION: 1.0  Mg IV Versed; 50 mcg IV Fentanyl Total Moderate Sedation Time: 25 minutes. PROCEDURE: The procedure risks, benefits, and alternatives were explained to the patient. Questions regarding the procedure were encouraged and answered. The patient understands and consents to the procedure. The left upper chest wall was prepped with chlorhexidinein a sterile fashion, and a sterile drape was applied covering the operative field. Sterile gloves were used for the procedure. Local anesthesia was provided with 1% Lidocaine. Under CT guidance, 17 gauge guiding needle was directed toward left upper lobe lung mass. Three core samples were obtained using 18 gauge biopsy needle. The samples were placed in formalin vial and delivered to pathology. Needle was removed with the simultaneous injection of approximately 10 cc of the patient's own blood drawn prior to procedure to serve as patch. Appropriate dressing was applied. Complications: None immediate. No pneumothorax seen on follow-up CT imaging. Follow-up chest radiograph will be obtained in 2 hours. FINDINGS: Left upper lobe lung mass as described above. IMPRESSION: Under CT guidance, percutaneous biopsy of left upper lobe lung mass was performed. Electronically Signed   By: Marijo Conception, M.D.   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  PLAN:    1.  Urothelial carcinoma:  PET scan results reviewed independently and reported as above with at least stable disease Because of his increased pain, will re-image with PET scan proior reinitiating treatment with cycle 5, day 1 of cisplatin and gemcitabine on December 25, 2015. If patient progresses can consider treatment with atezolizumab.  2.  Lung nodule: Pathology result reviewed independently with hyphae noted on sample. No malignant cells. Patient was given a referral to infectious disease for further evaluation and possible initiation will voriconazole. 3. Pain: Likely secondary to malignancy. Patient previously had XRT to his pelvic area. Continue current narcotic regimen as prescribed. 4. Hyperglycemia:  Patient is diabetic. Continue glipizide and metformin as prescribed. Monitor. 5. Anemia: Mild, monitor. 6. Atrial fibrillation: Patient's heart is in regular rhythm today. Continue treatment and follow up with cardiology next week. 7. Pulmonary embolism: Continue Eliquis as prescribed. Patient also has an IVC filter in place.  Approximately 30 minutes was spend in discussion of which greater than 50% was consultation.  Patient expressed understanding and was in agreement with this plan. He also understands that He can call clinic at any time with any questions, concerns, or complaints.   Urothelial cancer   Staging form: Kidney, AJCC 7th Edition     Clinical stage from 03/16/2015: Stage IV (TX, N1, M1) - Signed by Lloyd Huger, MD on 03/16/2015  Lloyd Huger, MD   12/13/2015 6:51 AM

## 2015-12-13 NOTE — Progress Notes (Signed)
Cardiac Individual Treatment Plan  Patient Details  Name: Louis Alexander MRN: [id number] Date of Birth: 07-13-1941 Referring Provider:  Dionisio David, MD  Initial Encounter Date:    Visit Diagnosis: S/P PTCA (percutaneous transluminal coronary angioplasty)  NSTEMI (non-ST elevated myocardial infarction) [facility])  Patient's Home Medications on Admission:  Current outpatient prescriptions:  .  apixaban (ELIQUIS) 5 MG TABS tablet, Take 5 mg by mouth 2 (two) times daily., Disp: , Rfl:  .  aspirin EC 81 MG tablet, Take 81 mg by mouth daily., Disp: , Rfl:  .  atenolol (TENORMIN) 25 MG tablet, Take 25 mg by mouth daily., Disp: , Rfl:  .  canagliflozin (INVOKANA) 300 MG TABS tablet, Take 300 mg by mouth daily before breakfast., Disp: 30 tablet, Rfl: 3 .  chlorproMAZINE (THORAZINE) 10 MG tablet, Take 1 tablet (10 mg total) by mouth 3 (three) times daily as needed for hiccoughs., Disp: 60 tablet, Rfl: 3 .  clopidogrel (PLAVIX) 75 MG tablet, Take 1 tablet (75 mg total) by mouth daily., Disp: 30 tablet, Rfl: 6 .  Coenzyme Q10 10 MG capsule, Take 10 mg by mouth., Disp: , Rfl:  .  fentaNYL (DURAGESIC - DOSED MCG/HR) 75 MCG/HR, Place 1 patch (75 mcg total) onto the skin every 3 (three) days., Disp: 10 patch, Rfl: 0 .  Fluticasone Furoate-Vilanterol (BREO ELLIPTA) 100-25 MCG/INH AEPB, Inhale 1 Inhaler into the lungs daily., Disp: , Rfl:  .  furosemide (LASIX) 20 MG tablet, Take 20 mg by mouth 2 (two) times daily., Disp: , Rfl:  .  glipiZIDE (GLUCOTROL) 10 MG tablet, TAKE 1 TABLET(S) BY MOUTH DAILY, Disp: 30 tablet, Rfl: 6 .  metFORMIN (GLUCOPHAGE-XR) 500 MG 24 hr tablet, 2 (TWO) TABLET, ORAL, TWO TIMES DAILY, Disp: 120 tablet, Rfl: 11 .  Multiple Vitamin (MULTIVITAMIN WITH MINERALS) TABS tablet, Take 1 tablet by mouth daily., Disp: , Rfl:  .  pantoprazole (PROTONIX) 40 MG tablet, Take 40 mg by mouth 2 (two) times daily., Disp: , Rfl:  .  potassium chloride (K-DUR) 10 MEQ tablet, Take 10 mEq by mouth  daily., Disp: , Rfl:  .  prasugrel (EFFIENT) 10 MG TABS tablet, Take 10 mg by mouth daily. Reported on 12/03/2015, Disp: , Rfl:  .  prochlorperazine (COMPAZINE) 10 MG tablet, Take 1 tablet (10 mg total) by mouth every 6 (six) hours as needed., Disp: 40 tablet, Rfl: 1 .  sotalol (BETAPACE) 80 MG tablet, Take by mouth 2 (two) times daily., Disp: , Rfl:  No current facility-administered medications for this visit.  Facility-Administered Medications Ordered in Other Visits:  .  Shower Chin To Toes With 60 mL chlorhexidine (HIBICLENS) the night before surgery, , , Once **AND** Shower Chin To Toes With 60 mL chlorhexidine (HIBICLENS) in AM of surgery after pre-op clip completed, , , Once **AND** chlorhexidine (HIBICLENS) 4 % liquid 4 application, 60 mL, Topical, Once **AND** chlorhexidine (HIBICLENS) 4 % liquid 4 application, 60 mL, Topical, Once, [employer], PA-C .  sodium chloride 0.9 % injection 10 mL, 10 mL, Intracatheter, PRN, Lloyd Huger, MD, 10 mL at 05/09/15 0900 .  sodium chloride 0.9 % injection 3 mL, 3 mL, Intravenous, PRN, Lloyd Huger, MD  Past Medical History: Past Medical History  Diagnosis Date  . Abnormal EKG   . Coronary artery disease   . Diabetes mellitus without complication ([place])   . Obesity   . Hyperlipidemia   . Hypertension   . Sleep apnea     could not tolerate  cpap mask  . Arthritis     oa  . Cancer [facility])     bladder and prostate  . History of radiation therapy 2012  . History of chemotherapy 2014  . Bladder cancer ([place])   . History of chicken pox   . Myocardial infarction ([place]) 2008 and 2012    x 2    Tobacco Use: History  Smoking status  . Never Smoker   Smokeless tobacco  . Never Used    Labs: Recent Review Flowsheet Data    Labs for ITP Cardiac and Pulmonary Rehab Latest Ref Rng 10/26/2012 12/22/2014 06/12/2015 09/12/2015   Cholestrol 0 - 200 mg/dL 134 189 - -   LDLCALC - 41 115 - -   HDL 35 - 70 mg/dL 36(L) 41 - -   Trlycerides 40  - 160 mg/dL 284(H) 165(A) - -   Hemoglobin A1c - - 7.2(A) 7.7 9.2       Exercise Target Goals:    Exercise Program Goal: Individual exercise prescription set with THRR, safety & activity barriers. Participant demonstrates ability to understand and report RPE using BORG scale, to self-measure pulse accurately, and to acknowledge the importance of the exercise prescription.  Exercise Prescription Goal: Starting with aerobic activity 30 plus minutes a day, 3 days per week for initial exercise prescription. Provide home exercise prescription and guidelines that participant acknowledges understanding prior to discharge.  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   urostomy tube    Risk Stratification High      6 Minute Walk:     6 Minute Walk      07/31/15 1140       6 Minute Walk   Phase Initial     Distance 850 feet     Walk Time 6 minutes     Resting HR 76 bpm     Resting BP 124/70 mmHg     Max Ex. HR 94 bpm     Max Ex. BP 140/66 mmHg     RPE 12     Symptoms No        Initial Exercise Prescription:     Initial Exercise Prescription - 07/31/15 1100    Date of Initial Exercise Prescription   Date 07/31/15   Treadmill   MPH 1.6   Grade 0   Minutes 10   Bike   Level 0.4   Minutes 10   Recumbant Bike   Level 3   RPM 40   Watts 25   Minutes 10   NuStep   Level 2   Watts 35   Minutes 15   Arm Ergometer   Level 1   Watts 10   Minutes 10   Arm/Foot Ergometer   Level 4   Watts 12   Minutes 10   Cybex   Level 3   RPM 50   Minutes 10   Recumbant Elliptical   Level 1   RPM 40   Watts 10   Minutes 10   Elliptical   Level 1   Speed 3   Minutes 1   REL-XR   Level 2   Watts 30   Minutes 10   Prescription Details   Frequency (times per week) 3    Duration Progress to 30 minutes of continuous aerobic without  signs/symptoms of physical distress   Intensity   THRR REST +  30   Ratings of Perceived Exertion 11-15   Progression Continue progressive overload as per policy without signs/symptoms or physical distress.   Resistance Training   Training Prescription Yes   Weight 2   Reps 10-15      Exercise Prescription Changes:     Exercise Prescription Changes      08/21/15 0700 08/22/15 0700 09/18/15 0700 10/15/15 1400 11/13/15 0700   Exercise Review   Progression --  Only two visits recorded No  Only 2 visits recorded and will be out for several weeks No  Absent since last review; last visit 08/06/15 No  Absent since last review; last visit 08/06/15 No  Absent since last review; last visit 08/06/15   Response to Exercise   Blood Pressure (Admit)  140/80 mmHg      Blood Pressure (Exercise)  168/82 mmHg      Blood Pressure (Exit)  134/72 mmHg      Heart Rate (Admit)  71 bpm      Heart Rate (Exercise)  95 bpm      Heart Rate (Exit)  75 bpm      Rating of Perceived Exertion (Exercise)  12      Symptoms  Tired      Comments  [name] is going to be out for several weeks due to the cancer that he also has. His workloads may need to be reevaluated upon his return.   Exercise workloads will need to be reviewed upon his return based on his exercise capacity.    Duration  Progress to 30 minutes of continuous aerobic without signs/symptoms of physical distress Progress to 30 minutes of continuous aerobic without signs/symptoms of physical distress Progress to 30 minutes of continuous aerobic without signs/symptoms of physical distress Progress to 30 minutes of continuous aerobic without signs/symptoms of physical distress   Intensity  Rest + 30 Rest + 30 Rest + 30 Rest + 30   Progression  Continue progressive overload as per policy without signs/symptoms or physical distress. Continue progressive overload as per policy without signs/symptoms or  physical distress. Continue progressive overload as per policy without signs/symptoms or physical distress. Continue progressive overload as per policy without signs/symptoms or physical distress.   Resistance Training   Training Prescription  Yes Yes Yes Yes   Weight  _0 Reps  10-15 10-15 10-15 10-15   Interval Training   Interval Training  No No No No   Treadmill   MPH  1.5 1.5 1.5 1.5   Grade  0 0 0 0   Minutes  _1 REL-XR   Level  _2 Watts  40 40 40 40   Minutes  _3 11/30/15 0733 12/12/15 0800         Exercise Review   Progression Yes       Response to Exercise   Blood Pressure (Admit) 138/82 mmHg       Blood Pressure (Exercise) 130/74 mmHg       Blood Pressure (Exit) 114/66 mmHg       Heart Rate (Admit) 74 bpm       Heart Rate (Exercise) 88 bpm       Heart Rate (Exit) 72 bpm       Rating of Perceived Exertion (Exercise) 13       Symptoms  None None      Comments [name] is actually in good fitness for being out for so long and feels a lot better. He made some increases this month and is encouraged by how well he feels. He is very compliant to exercise suggestions and willing to progress.       Duration Progress to 30 minutes of continuous aerobic without signs/symptoms of physical distress Progress to 30 minutes of continuous aerobic without signs/symptoms of physical distress      Intensity Rest + 30 Rest + 30      Progression Continue progressive overload as per policy without signs/symptoms or physical distress. Continue progressive overload as per policy without signs/symptoms or physical distress.      Resistance Training   Training Prescription Yes Yes      Weight 2 2      Reps 10-15 10-15      Interval Training   Interval Training No No      Treadmill   MPH 1.7 1.7      Grade 0 0      Minutes 15 15      REL-XR   Level 5 6      Watts 60 60      Minutes 15 15      Biostep-RELP   Level 5 5      Watts 84 45      Minutes 15 15          Discharge Exercise Prescription (Final Exercise Prescription Changes):     Exercise Prescription Changes - 12/12/15 0800    Response to Exercise   Symptoms None   Duration Progress to 30 minutes of continuous aerobic without signs/symptoms of physical distress   Intensity Rest + 30   Progression Continue progressive overload as per policy without signs/symptoms or physical distress.   Resistance Training   Training Prescription Yes   Weight 2   Reps 10-15   Interval Training   Interval Training No   Treadmill   MPH 1.7   Grade 0   Minutes 15   REL-XR   Level 6   Watts 60   Minutes 15   Biostep-RELP   Level 5   Watts 45   Minutes 15      Nutrition:  Target Goals: Understanding of nutrition guidelines, daily intake of sodium <1547m, cholesterol <2059m calories 30% from fat and 7% or less from saturated fats, daily to have 5 or more servings of fruits and vegetables.  Biometrics:     Pre Biometrics - 07/31/15 1136    Pre Biometrics   Height 5' 10.5" (1.791 m)   Weight 203 lb 4.8 oz (92.216 kg)   Waist Circumference 40.25 inches   Hip Circumference 44.5 inches   Waist to Hip Ratio 0.9 %   BMI (Calculated) 28.8       Nutrition Therapy Plan and Nutrition Goals:     Nutrition Therapy & Goals - 12/07/15 0931    Personal Nutrition Goals   Personal Goal #1 DoTimmothy Sourss still doing well with his heart healthy eating and has no concerns or questions      Nutrition Discharge: Rate Your Plate Scores:   Nutrition Goals Re-Evaluation:     Nutrition Goals Re-Evaluation      08/23/15 1536 10/09/15 1333         Personal Goal #1 Re-Evaluation   Personal Goal #1 DoTamelas been out of Cardiac Rehab since 08/06/2015 since he told DiRoanna Epleyhat "exercising  is antogonizing his tumor/cancer".  Peregrine called [place] and said he wants to return to Cardiac REhab in about a month. He is currently getting a cancer treatment today. [place] has nutritionist.          Psychosocial: Target Goals: Acknowledge presence or absence of depression, maximize coping skills, provide positive support system. Participant is able to verbalize types and ability to use techniques and skills needed for reducing stress and depression.  Initial Review & Psychosocial Screening:     Initial Psych Review & Screening - 08/01/15 [place]? Yes   Screening Interventions   Interventions Encouraged to exercise;Program counselor consult      Quality of Life Scores:     Quality of Life - 08/01/15 1208    Quality of Life Scores   Health/Function Pre 19.87 %   Socioeconomic Pre 24.21 %   Psych/Spiritual Pre 24 %   Family Pre 24.8 %   GLOBAL Pre 22.93 %      PHQ-9:     Recent Review Flowsheet Data    Depression screen [facility] 2/9 07/31/2015 05/03/2015   Decreased Interest 0 0   Down, Depressed, Hopeless 0 0   PHQ - 2 Score 0 0   Altered sleeping 3 -   Tired, decreased energy 1 -   Change in appetite 0 -   Feeling bad or failure about yourself  0 -   Trouble concentrating 0 -   Moving slowly or fidgety/restless 1 -   Suicidal thoughts 0 -   PHQ-9 Score 5 -   Difficult doing work/chores Not difficult at all -      Psychosocial Evaluation and Intervention:     Psychosocial Evaluation - 12/12/15 1037    Psychosocial Evaluation & Interventions   Interventions Therapist referral;Encouraged to exercise with the program and follow exercise prescription;Stress management education   Comments Counselor met with Mr. Olander for initial psychosocical evaluation.  Mr. Mamie Nick has been attending this program for awhile, but is on a different schedule than counselor, so hard to connect.  Mr. Mamie Nick is a 75 year old who had his 4th stent inserted several months ago.  He has a strong support system with a spouse of 55 years, several adult children close by and active involvement in a local church community.  Mr. Mamie Nick has other health issues that are  impacting his recovery as he is currently on Chemotherapy due to several cancer spots detected in his body.  Mr. Mamie Nick states he sleeps okay (a little better lately) and his appetite is "not good" due to the Chemo.  He denies a history of depression or anxiety, but does admit some current symptoms with sadness and tearfulness more lately.  Mr. Mamie Nick reported his mood is a "3" on a scale of 1-10, with "10" being best.  Mr. Mamie Nick states his mood was better several weeks ago, until he learned about additional "spots" of cancer he was not aware of at the time.  He admits his health, trying to get the house sold, and finances are the biggest stressors for him currently.  He had goals for increased energy for this program and has already experienced that, with Mr. Mamie Nick reporting being able to walk faster and further lately.  He plans to join a gym to continue working out consistently upon completion of this program.  Counselor recommended Mr. Mamie Nick see a local therapist to address his mood and provide supportive services  during this time with so many stressors and health issues impacting his functioning.  Contact information for the therapist was provided.     Continued Psychosocial Services Needed --  Counselor will follow up with recommendation for Mr. Mamie Nick to see a counselor, as well as participation in stress management  psychoeducational component of this program.        Psychosocial Re-Evaluation:     Psychosocial Re-Evaluation      08/23/15 1537 08/29/15 1157 10/09/15 1342       Psychosocial Re-Evaluation   Interventions Encouraged to attend Cardiac Rehabilitation for the exercise Stress management education      Comments Many stressors including -Marquee has been out of Cardiac Rehab since 08/06/2015 since he told Roanna Epley that "exercising is antogonizing his tumor/cancer".  Kelsie called [place] and said he wants to return to Cardiac REhab in about a month. He is currently getting a cancer treatment today. [facility] has a counselor for their patients also. Laverne was also reported he was in the hospital for Blood clot        Vocational Rehabilitation: Provide vocational rehab assistance to qualifying candidates.   Vocational Rehab Evaluation & Intervention:     Vocational Rehab - 07/31/15 1102    Initial Vocational Rehab Evaluation & Intervention   Assessment shows need for Vocational Rehabilitation No      Education: Education Goals: Education classes will be provided on a weekly basis, covering required topics. Participant will state understanding/return demonstration of topics presented.  Learning Barriers/Preferences:     Learning Barriers/Preferences - 07/31/15 1102    Learning Barriers/Preferences   Learning Barriers None   Learning Preferences None      Education Topics: General Nutrition Guidelines/Fats and Fiber: -Group instruction provided by verbal, written material, models and posters to present the general guidelines for heart healthy nutrition. Gives an explanation and review of dietary fats and fiber.   Controlling Sodium/Reading Food Labels: -Group verbal and written material supporting the discussion of sodium use in heart healthy nutrition. Review and explanation with models, verbal and written materials for utilization of the food label.   Exercise Physiology & Risk Factors: - Group verbal and written instruction with models to review the exercise physiology of the cardiovascular system and associated critical values. Details cardiovascular disease risk factors and the goals associated with each risk factor.          Cardiac Rehab from 12/12/2015 in [facility] Cardiac and Pulmonary Rehab   Date  11/21/15   Educator  RM   Instruction Review Code  2- meets goals/outcomes      Aerobic Exercise & Resistance Training: - Gives group verbal and written discussion on the health impact of inactivity. On the components of aerobic and resistive training programs and the  benefits of this training and how to safely progress through these programs.   Flexibility, Balance, General Exercise Guidelines: - Provides group verbal and written instruction on the benefits of flexibility and balance training programs. Provides general exercise guidelines with specific guidelines to those with heart or lung disease. Demonstration and skill practice provided.      Cardiac Rehab from 12/12/2015 in [facility] Cardiac and Pulmonary Rehab   Date  08/06/15   Educator  [facility]   Instruction Review Code  2- meets goals/outcomes      Stress Management: - Provides group verbal and written instruction about the health risks of elevated stress, cause of high stress, and healthy ways to reduce stress.   Depression: -  Provides group verbal and written instruction on the correlation between heart/lung disease and depressed mood, treatment options, and the stigmas associated with seeking treatment.   Anatomy & Physiology of the Heart: - Group verbal and written instruction and models provide basic cardiac anatomy and physiology, with the coronary electrical and arterial systems. Review of: AMI, Angina, Valve disease, Heart Failure, Cardiac Arrhythmia, Pacemakers, and the ICD.   Cardiac Procedures: - Group verbal and written instruction and models to describe the testing methods done to diagnose heart disease. Reviews the outcomes of the test results. Describes the treatment choices: Medical Management, Angioplasty, or Coronary Bypass Surgery.   Cardiac Medications: - Group verbal and written instruction to review commonly prescribed medications for heart disease. Reviews the medication, class of the drug, and side effects. Includes the steps to properly store meds and maintain the prescription regimen.   Go Sex-Intimacy & Heart Disease, Get SMART - Goal Setting: - Group verbal and written instruction through game format to discuss heart disease and the return to sexual intimacy. Provides  group verbal and written material to discuss and apply goal setting through the application of the S.M.A.R.T. Method.   Other Matters of the Heart: - Provides group verbal, written materials and models to describe Heart Failure, Angina, Valve Disease, and Diabetes in the realm of heart disease. Includes description of the disease process and treatment options available to the cardiac patient.   Exercise & Equipment Safety: - Individual verbal instruction and demonstration of equipment use and safety with use of the equipment.      Cardiac Rehab from 12/12/2015 in [facility] Cardiac and Pulmonary Rehab   Date  08/06/15   Educator  RM   Instruction Review Code  2- meets goals/outcomes      Infection Prevention: - Provides verbal and written material to individual with discussion of infection control including proper hand washing and proper equipment cleaning during exercise session.      Cardiac Rehab from 12/12/2015 in [facility] Cardiac and Pulmonary Rehab   Date  08/06/15   Educator  RM   Instruction Review Code  2- meets goals/outcomes      Falls Prevention: - Provides verbal and written material to individual with discussion of falls prevention and safety.   Diabetes: - Individual verbal and written instruction to review signs/symptoms of diabetes, desired ranges of glucose level fasting, after meals and with exercise. Advice that pre and post exercise glucose checks will be done for 3 sessions at entry of program.      Cardiac Rehab from 12/12/2015 in [facility] Cardiac and Pulmonary Rehab   Date  07/31/15   Educator  SB   Instruction Review Code  2- meets goals/outcomes       Knowledge Questionnaire Score:     Knowledge Questionnaire Score - 08/01/15 1209    Knowledge Questionnaire Score   Pre Score 22/28      Personal Goals and Risk Factors at Admission:     Personal Goals and Risk Factors at Admission - 07/31/15 1103    Personal Goals and Risk Factors on Admission   Increase  Aerobic Exercise and Physical Activity Yes   Intervention While in program, learn and follow the exercise prescription taught. Start at a low level workload and increase workload after able to maintain previous level for 30 minutes. Increase time before increasing intensity.   Understand more about Heart/Pulmonary Disease. Yes   Intervention While in program utilize professionals for any questions, and attend the education sessions. [employer]  to use are www.americanheart.org or www.lung.org for reliable information.   Diabetes Yes   Goal Blood glucose control identified by blood glucose values, HgbA1C. Participant verbalizes understanding of the signs/symptoms of hyper/hypo glycemia, proper foot care and importance of medication and nutrition plan for blood glucose control.   Intervention Provide nutrition & aerobic exercise along with prescribed medications to achieve blood glucose in normal ranges: Fasting 65-99 mg/dL   Hypertension Yes   Goal Participant will see blood pressure controlled within the values of 140/14m/Hg or within value directed by their physician.   Intervention Provide nutrition & aerobic exercise along with prescribed medications to achieve BP 140/90 or less.   Lipids Yes   Goal Cholesterol controlled with medications as prescribed, with individualized exercise RX and with personalized nutrition plan. Value goals: LDL < 772m HDL > 4061mParticipant states understanding of desired cholesterol values and following prescriptions.   Intervention Provide nutrition & aerobic exercise along with prescribed medications to achieve LDL <63m51mDL >40mg19m   Personal Goals and Risk Factors Review:      Goals and Risk Factor Review      08/23/15 1537 08/29/15 1157 10/09/15 1340 12/04/15 0737 12/07/15 0931   Increase Aerobic Exercise and Physical Activity   Goals Progress/Improvement seen  No No No Yes    Comments DonalEilanbeen out of Cardiac Rehab since 08/06/2015 since he  told DianeRoanna Epley "exercising is antogonizing his tumor/cancer".  DonalLoyed us anKoreasaid he wants to return to Cardiac REhab in about a month. [facility] DonalJadalso reported he was in the hospital for Blood clots. Don iTimmothy Soursrogressing well ,especially considering he was out for several months. The cancer and treatments were affecting his exercise and he is feeling much better now. Until he has more consistent attendance, we will hold off on interval training and adding exercise at home. We will follow up with him in February on how he is feeling with the exercise and add new goals at that time.     Diabetes   Goal --  DonalCaidencebeen out of Cardiac Rehab since 08/06/2015 since he told DianeRoanna Epley "exercising is antogonizing his tumor/cancer".  --  [place]. Has been out since Sept from Cardiac Rehab.      Progress seen towards goals     No   Comments     No change   Hypertension   Goal --  DonalJoseandresbeen out of Cardiac Rehab since 08/06/2015 since he told DianeRoanna Epley "exercising is antogonizing his tumor/cancer".  --  DonalEulogioed us anKoreasaid he wants to return to Cardiac REhab in about a month. He is currently getting a cancer treatment today. [facility]. DonalTrevynalso reported he was in the hospital for Blood clot     Progress seen toward goals     No   Comments     No change, BP medication is doing its job  and BP readings in class are in appropriate range   Abnormal Lipids   Goal   --  DonalToluwanied us anKoreasaid he wants to return to Cardiac REhab in about a month. He is currently getting a cancer treatment today. [facility]ist. DonalMilledgealso reported he was in the hospital for Blood clot     Progress seen towards goals  No   Comments     Lipids have not been re-evaluated      Personal Goals Discharge (Final Personal Goals  and Risk Factors Review):      Goals and Risk Factor Review - 12/07/15 0931    Diabetes   Progress seen towards goals No   Comments No change   Hypertension   Progress seen toward goals No   Comments No change, BP medication is doing its job  and BP readings in class are in appropriate range   Abnormal Lipids   Progress seen towards goals No   Comments Lipids have not been re-evaluated      ITP Comments:     ITP Comments      10/22/15 1216 11/18/15 1225 11/23/15 0946 12/13/15 1134     ITP Comments 30 day review preparation: Continue with ITP Ready for 30 day review.  Continue with ITP  Has only attended 4 sessions. Last on 08/06/2015. Has been out with medical concerns. Plans to return Jan 4.  [name] did well after first day back on Wed. He was tired today, so was advised to slow down and take rest breaks as needed.  Ready for 30 day review. Continue with ITP.       Comments:

## 2015-12-14 ENCOUNTER — Ambulatory Visit: Payer: PPO | Admitting: Family Medicine

## 2015-12-19 ENCOUNTER — Encounter: Payer: PPO | Attending: Cardiovascular Disease

## 2015-12-19 DIAGNOSIS — I214 Non-ST elevation (NSTEMI) myocardial infarction: Secondary | ICD-10-CM | POA: Diagnosis not present

## 2015-12-19 DIAGNOSIS — Z9861 Coronary angioplasty status: Secondary | ICD-10-CM | POA: Diagnosis present

## 2015-12-19 NOTE — Addendum Note (Signed)
Addended by: Lynford Humphrey on: 12/19/2015 07:10 AM   Modules accepted: Orders

## 2015-12-19 NOTE — Progress Notes (Signed)
Daily Session Note  Patient Details  Name: Louis Alexander MRN: [id number] Date of Birth: [birth year] Referring Provider:  Dionisio David, MD  Encounter Date: 12/19/2015  Check In:     Session Check In - 12/19/15 0901    Check-In   Staff Present Heath Lark, RN, BSN, CCRP;Renee Dillard Essex, MS, ACSM CEP, Exercise Physiologist;Moss Berry, BS, ACSM EP-C, Exercise Physiologist   ER physicians immediately available to respond to emergencies See telemetry face sheet for immediately available ER MD   Medication changes reported     No   Fall or balance concerns reported    No   Warm-up and Cool-down Performed on first and last piece of equipment   VAD Patient? No   Pain Assessment   Currently in Pain? No/denies         Goals Met:  Proper associated with RPD/PD & O2 Sat Exercise tolerated well No report of cardiac concerns or symptoms Strength training completed today  Goals Unmet:  Not Applicable  Goals Comments:    Dr. Emily Filbert is Medical Director for [place] and [facility].

## 2015-12-20 ENCOUNTER — Ambulatory Visit
Admission: RE | Admit: 2015-12-20 | Discharge: 2015-12-20 | Disposition: A | Discharge status: Home / Self Care | Payer: PPO | Source: Ambulatory Visit | Attending: Oncology | Admitting: Oncology

## 2015-12-20 ENCOUNTER — Other Ambulatory Visit: Payer: Self-pay | Admitting: Oncology

## 2015-12-20 DIAGNOSIS — R590 Localized enlarged lymph nodes: Secondary | ICD-10-CM | POA: Insufficient documentation

## 2015-12-20 DIAGNOSIS — Z0189 Encounter for other specified special examinations: Secondary | ICD-10-CM | POA: Diagnosis present

## 2015-12-20 DIAGNOSIS — C782 Secondary malignant neoplasm of pleura: Secondary | ICD-10-CM | POA: Diagnosis not present

## 2015-12-20 DIAGNOSIS — C689 Malignant neoplasm of urinary organ, unspecified: Secondary | ICD-10-CM | POA: Diagnosis not present

## 2015-12-20 LAB — GLUCOSE, CAPILLARY: Glucose-Capillary: 127 mg/dL — ABNORMAL HIGH (ref 65–99)

## 2015-12-20 MED ORDER — FLUDEOXYGLUCOSE F - 18 (FDG) INJECTION
12.0500 | Freq: Once | INTRAVENOUS | Status: AC | PRN
  Administered 2015-12-20: 12.05 via INTRAVENOUS

## 2015-12-21 ENCOUNTER — Encounter: Payer: PPO | Admitting: *Deleted

## 2015-12-21 DIAGNOSIS — Z9861 Coronary angioplasty status: Secondary | ICD-10-CM

## 2015-12-21 DIAGNOSIS — I214 Non-ST elevation (NSTEMI) myocardial infarction: Secondary | ICD-10-CM | POA: Diagnosis not present

## 2015-12-21 NOTE — Progress Notes (Signed)
Daily Session Note  Patient Details  Name: Louis Alexander MRN: [id number] Date of Birth: 1941-02-04 Referring Provider:  Dionisio David, MD  Encounter Date: 12/21/2015  Check In:     Session Check In - 12/21/15 0915    Check-In   Staff Present Candiss Norse, MS, ACSM CEP, Exercise Physiologist;Carroll Enterkin, RN, BSN;Susanne Bice, RN, BSN, [place]   ER physicians immediately available to respond to emergencies See telemetry face sheet for immediately available ER MD   Medication changes reported     No   Fall or balance concerns reported    No   Warm-up and Cool-down Performed on first and last piece of equipment   VAD Patient? No   Pain Assessment   Currently in Pain? No/denies   Multiple Pain Sites No         Goals Met:  Independence with exercise equipment Exercise tolerated well No report of cardiac concerns or symptoms Strength training completed today  Goals Unmet:  Not Applicable  Goals Comments: Patient completed exercise prescription and all exercise goals during rehab session. The exercise was tolerated well and the patient is progressing in the program.    Dr. Emily Filbert is Medical Director for [place] and [facility].

## 2015-12-24 ENCOUNTER — Other Ambulatory Visit: Payer: Self-pay | Admitting: *Deleted

## 2015-12-24 DIAGNOSIS — C689 Malignant neoplasm of urinary organ, unspecified: Secondary | ICD-10-CM

## 2015-12-25 ENCOUNTER — Inpatient Hospital Stay (HOSPITAL_BASED_OUTPATIENT_CLINIC_OR_DEPARTMENT_OTHER): Payer: PPO | Admitting: Oncology

## 2015-12-25 ENCOUNTER — Inpatient Hospital Stay: Payer: PPO | Attending: Oncology

## 2015-12-25 ENCOUNTER — Inpatient Hospital Stay: Payer: PPO

## 2015-12-25 VITALS — BP 144/92 | HR 61 | Temp 97.7°F | Resp 16 | Wt 185.8 lb

## 2015-12-25 DIAGNOSIS — Z8546 Personal history of malignant neoplasm of prostate: Secondary | ICD-10-CM | POA: Diagnosis not present

## 2015-12-25 DIAGNOSIS — R918 Other nonspecific abnormal finding of lung field: Secondary | ICD-10-CM | POA: Diagnosis not present

## 2015-12-25 DIAGNOSIS — Z79899 Other long term (current) drug therapy: Secondary | ICD-10-CM | POA: Diagnosis not present

## 2015-12-25 DIAGNOSIS — G4730 Sleep apnea, unspecified: Secondary | ICD-10-CM

## 2015-12-25 DIAGNOSIS — Z86711 Personal history of pulmonary embolism: Secondary | ICD-10-CM | POA: Insufficient documentation

## 2015-12-25 DIAGNOSIS — Z7901 Long term (current) use of anticoagulants: Secondary | ICD-10-CM | POA: Insufficient documentation

## 2015-12-25 DIAGNOSIS — K435 Parastomal hernia without obstruction or  gangrene: Secondary | ICD-10-CM

## 2015-12-25 DIAGNOSIS — E1165 Type 2 diabetes mellitus with hyperglycemia: Secondary | ICD-10-CM | POA: Insufficient documentation

## 2015-12-25 DIAGNOSIS — Z7984 Long term (current) use of oral hypoglycemic drugs: Secondary | ICD-10-CM | POA: Diagnosis not present

## 2015-12-25 DIAGNOSIS — D649 Anemia, unspecified: Secondary | ICD-10-CM | POA: Insufficient documentation

## 2015-12-25 DIAGNOSIS — R531 Weakness: Secondary | ICD-10-CM

## 2015-12-25 DIAGNOSIS — C782 Secondary malignant neoplasm of pleura: Secondary | ICD-10-CM | POA: Diagnosis not present

## 2015-12-25 DIAGNOSIS — R5383 Other fatigue: Secondary | ICD-10-CM | POA: Diagnosis not present

## 2015-12-25 DIAGNOSIS — R590 Localized enlarged lymph nodes: Secondary | ICD-10-CM

## 2015-12-25 DIAGNOSIS — C679 Malignant neoplasm of bladder, unspecified: Secondary | ICD-10-CM | POA: Insufficient documentation

## 2015-12-25 DIAGNOSIS — Z923 Personal history of irradiation: Secondary | ICD-10-CM

## 2015-12-25 DIAGNOSIS — I252 Old myocardial infarction: Secondary | ICD-10-CM | POA: Insufficient documentation

## 2015-12-25 DIAGNOSIS — E785 Hyperlipidemia, unspecified: Secondary | ICD-10-CM | POA: Insufficient documentation

## 2015-12-25 DIAGNOSIS — E669 Obesity, unspecified: Secondary | ICD-10-CM | POA: Insufficient documentation

## 2015-12-25 DIAGNOSIS — M129 Arthropathy, unspecified: Secondary | ICD-10-CM

## 2015-12-25 DIAGNOSIS — F419 Anxiety disorder, unspecified: Secondary | ICD-10-CM | POA: Diagnosis not present

## 2015-12-25 DIAGNOSIS — I10 Essential (primary) hypertension: Secondary | ICD-10-CM | POA: Diagnosis not present

## 2015-12-25 DIAGNOSIS — Z5111 Encounter for antineoplastic chemotherapy: Secondary | ICD-10-CM | POA: Diagnosis not present

## 2015-12-25 DIAGNOSIS — Z7982 Long term (current) use of aspirin: Secondary | ICD-10-CM | POA: Diagnosis not present

## 2015-12-25 DIAGNOSIS — C787 Secondary malignant neoplasm of liver and intrahepatic bile duct: Secondary | ICD-10-CM | POA: Diagnosis not present

## 2015-12-25 DIAGNOSIS — C7951 Secondary malignant neoplasm of bone: Secondary | ICD-10-CM | POA: Diagnosis not present

## 2015-12-25 DIAGNOSIS — I4891 Unspecified atrial fibrillation: Secondary | ICD-10-CM

## 2015-12-25 DIAGNOSIS — I2510 Atherosclerotic heart disease of native coronary artery without angina pectoris: Secondary | ICD-10-CM | POA: Diagnosis not present

## 2015-12-25 DIAGNOSIS — R110 Nausea: Secondary | ICD-10-CM

## 2015-12-25 DIAGNOSIS — C689 Malignant neoplasm of urinary organ, unspecified: Secondary | ICD-10-CM

## 2015-12-25 DIAGNOSIS — R109 Unspecified abdominal pain: Secondary | ICD-10-CM

## 2015-12-25 DIAGNOSIS — R739 Hyperglycemia, unspecified: Secondary | ICD-10-CM | POA: Diagnosis not present

## 2015-12-25 DIAGNOSIS — Z9221 Personal history of antineoplastic chemotherapy: Secondary | ICD-10-CM

## 2015-12-25 DIAGNOSIS — C7910 Secondary malignant neoplasm of unspecified urinary organs: Secondary | ICD-10-CM

## 2015-12-25 LAB — CBC WITH DIFFERENTIAL/PLATELET
Basophils Absolute: 0.1 10*3/uL (ref 0–0.1)
Basophils Relative: 1 %
Eosinophils Absolute: 0.5 10*3/uL (ref 0–0.7)
Eosinophils Relative: 5 %
HEMATOCRIT: 33.8 % — AB (ref 40.0–52.0)
Hemoglobin: 11.3 g/dL — ABNORMAL LOW (ref 13.0–18.0)
LYMPHS PCT: 21 %
Lymphs Abs: 2 10*3/uL (ref 1.0–3.6)
MCH: 29.9 pg (ref 26.0–34.0)
MCHC: 33.4 g/dL (ref 32.0–36.0)
MCV: 89.4 fL (ref 80.0–100.0)
MONO ABS: 0.7 10*3/uL (ref 0.2–1.0)
MONOS PCT: 8 %
NEUTROS ABS: 6.5 10*3/uL (ref 1.4–6.5)
Neutrophils Relative %: 65 %
Platelets: 266 10*3/uL (ref 150–440)
RBC: 3.78 MIL/uL — ABNORMAL LOW (ref 4.40–5.90)
RDW: 15.8 % — AB (ref 11.5–14.5)
WBC: 9.8 10*3/uL (ref 3.8–10.6)

## 2015-12-25 LAB — COMPREHENSIVE METABOLIC PANEL
ALT: 13 U/L — ABNORMAL LOW (ref 17–63)
ANION GAP: 8 (ref 5–15)
AST: 17 U/L (ref 15–41)
Albumin: 3.8 g/dL (ref 3.5–5.0)
Alkaline Phosphatase: 88 U/L (ref 38–126)
BUN: 31 mg/dL — AB (ref 6–20)
CHLORIDE: 97 mmol/L — AB (ref 101–111)
CO2: 26 mmol/L (ref 22–32)
Calcium: 9.4 mg/dL (ref 8.9–10.3)
Creatinine, Ser: 1.82 mg/dL — ABNORMAL HIGH (ref 0.61–1.24)
GFR, EST AFRICAN AMERICAN: 41 mL/min — AB (ref >60)
GFR, EST NON AFRICAN AMERICAN: 35 mL/min — AB (ref >60)
Glucose, Bld: 195 mg/dL — ABNORMAL HIGH (ref 65–99)
POTASSIUM: 4.3 mmol/L (ref 3.5–5.1)
Sodium: 131 mmol/L — ABNORMAL LOW (ref 135–145)
TOTAL PROTEIN: 7 g/dL (ref 6.5–8.1)
Total Bilirubin: 0.5 mg/dL (ref 0.3–1.2)

## 2015-12-25 MED ORDER — SODIUM CHLORIDE 0.9% FLUSH
10.0000 mL | INTRAVENOUS | Status: AC | PRN
  Administered 2015-12-25: 10 mL via INTRAVENOUS
  Filled 2015-12-25: qty 10

## 2015-12-25 MED ORDER — SODIUM CHLORIDE 0.9 % IV SOLN
35.0000 mg/m2 | Freq: Once | INTRAVENOUS | Status: AC
  Administered 2015-12-25: 75 mg via INTRAVENOUS
  Filled 2015-12-25: qty 75

## 2015-12-25 MED ORDER — SODIUM CHLORIDE 0.9 % IV SOLN
Freq: Once | INTRAVENOUS | Status: AC
  Administered 2015-12-25: 13:00:00 via INTRAVENOUS
  Filled 2015-12-25: qty 5

## 2015-12-25 MED ORDER — HEPARIN SOD (PORK) LOCK FLUSH 100 UNIT/ML IV SOLN
500.0000 [IU] | Freq: Once | INTRAVENOUS | Status: AC
  Administered 2015-12-25: 500 [IU] via INTRAVENOUS
  Filled 2015-12-25: qty 5

## 2015-12-25 MED ORDER — SODIUM CHLORIDE 0.9 % IV SOLN
Freq: Once | INTRAVENOUS | Status: AC
  Administered 2015-12-25: 10:00:00 via INTRAVENOUS
  Filled 2015-12-25: qty 1000

## 2015-12-25 MED ORDER — POTASSIUM CHLORIDE 2 MEQ/ML IV SOLN
Freq: Once | INTRAVENOUS | Status: AC
  Administered 2015-12-25: 10:00:00 via INTRAVENOUS
  Filled 2015-12-25: qty 1000

## 2015-12-25 MED ORDER — PALONOSETRON HCL INJECTION 0.25 MG/5ML
0.2500 mg | Freq: Once | INTRAVENOUS | Status: AC
  Administered 2015-12-25: 0.25 mg via INTRAVENOUS
  Filled 2015-12-25: qty 5

## 2015-12-25 MED ORDER — SODIUM CHLORIDE 0.9 % IV SOLN
1000.0000 mg/m2 | Freq: Once | INTRAVENOUS | Status: AC
  Administered 2015-12-25: 2128 mg via INTRAVENOUS
  Filled 2015-12-25: qty 50.71

## 2015-12-25 NOTE — Progress Notes (Signed)
Pain is 4-5/10 on pain scale today.  Is having nausea that is not relieved with medication.

## 2015-12-26 ENCOUNTER — Encounter: Payer: PPO | Admitting: *Deleted

## 2015-12-26 DIAGNOSIS — Z9861 Coronary angioplasty status: Secondary | ICD-10-CM

## 2015-12-26 DIAGNOSIS — I214 Non-ST elevation (NSTEMI) myocardial infarction: Secondary | ICD-10-CM | POA: Diagnosis not present

## 2015-12-26 NOTE — Progress Notes (Signed)
Daily Session Note  Patient Details  Name: Louis Alexander MRN: [id number] Date of Birth: 1941-08-24 Referring Provider:  Birdie Sons, MD  Encounter Date: 12/26/2015  Check In:     Session Check In - 12/26/15 0856    Check-In   Location [facility]   Staff Present Candiss Norse, MS, ACSM CEP, Exercise Physiologist;Carroll Enterkin, RN, BSN;Susanne Bice, RN, BSN, CCRP   Supervising physician immediately available to respond to emergencies See telemetry face sheet for immediately available ER MD   Medication changes reported     No   Fall or balance concerns reported    No   Warm-up and Cool-down Performed on first and last piece of equipment   Resistance Training Performed Yes   VAD Patient? No   Pain Assessment   Currently in Pain? No/denies   Multiple Pain Sites No           Exercise Prescription Changes - 12/26/15 0800    Response to Exercise   Symptoms None   Comments Reviewed individualized exercise prescription and made increases per departmental policy. Exercise increases were discussed with the patient and they were able to perform the new work loads without issue (no signs or symptoms).    Duration Progress to 50 minutes of aerobic without signs/symptoms of physical distress   Intensity Rest + 30   Progression Continue progressive overload as per policy without signs/symptoms or physical distress.   Resistance Training   Training Prescription Yes   Weight 2   Reps 10-15   Interval Training   Interval Training No   Treadmill   MPH 1.7   Grade 0   Minutes 15   REL-XR   Level 7   Watts 60   Minutes 20   Biostep-RELP   Level 5   Watts 45   Minutes 15      Goals Met:  Independence with exercise equipment Exercise tolerated well Personal goals reviewed No report of cardiac concerns or symptoms Strength training completed today  Goals Unmet:  Not Applicable  Goals Comments: Patient completed exercise prescription and all  exercise goals during rehab session. The exercise was tolerated well and the patient is progressing in the program.    Dr. Emily Filbert is Medical Director for [place] and [facility].

## 2015-12-28 ENCOUNTER — Encounter: Payer: PPO | Admitting: *Deleted

## 2015-12-28 ENCOUNTER — Encounter: Payer: Self-pay | Admitting: Family Medicine

## 2015-12-28 DIAGNOSIS — Z9861 Coronary angioplasty status: Secondary | ICD-10-CM

## 2015-12-28 DIAGNOSIS — I214 Non-ST elevation (NSTEMI) myocardial infarction: Secondary | ICD-10-CM | POA: Diagnosis not present

## 2015-12-28 NOTE — Progress Notes (Signed)
Daily Session Note  Patient Details  Name: MAKHAI FULCO MRN: [id number] Date of Birth: 1941-01-04 Referring Provider:  Dionisio David, MD  Encounter Date: 12/28/2015  Check In:     Session Check In - 12/28/15 0905    Check-In   Location [facility]   Staff Present Candiss Norse, MS, ACSM CEP, Exercise Physiologist;Carroll Enterkin, RN, BSN;Susanne Bice, RN, BSN, CCRP   Supervising physician immediately available to respond to emergencies See telemetry face sheet for immediately available ER MD   Medication changes reported     No   Fall or balance concerns reported    No   Warm-up and Cool-down Performed on first and last piece of equipment   Resistance Training Performed Yes   VAD Patient? No   Pain Assessment   Currently in Pain? No/denies   Multiple Pain Sites No         Goals Met:  Independence with exercise equipment Exercise tolerated well No report of cardiac concerns or symptoms Strength training completed today  Goals Unmet:  Not Applicable  Goals Comments: Patient completed exercise prescription and all exercise goals during rehab session. The exercise was tolerated well and the patient is progressing in the program.    Dr. Emily Filbert is Medical Director for [place] and [facility].

## 2015-12-30 NOTE — Progress Notes (Signed)
Louis Alexander  [phone] [phone]) [phone]  ID: [name] OB: 1941-07-08  MR#: [id number]  [id number]  Patient Care Team: Birdie Sons, MD as PCP - General (Family Medicine) Murrell Redden, MD (Urology) Dionisio David, MD as Consulting Physician (Cardiology) Lloyd Huger, MD as Consulting Physician (Oncology)  CHIEF COMPLAINT:  Chief Complaint  Patient presents with  . urothelial carcinoma    INTERVAL HISTORY: Patient returns to clinic for further evaluation and reinitiation of chemotherapy. His pain is now approximately 5/10.  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 He is highly anxious.  He does not complain of chest pain or shortness of breath. He has no neurologic complaints. Patient denies any fevers. He denies any nausea, vomiting, constipation, or diarrhea. Patient offers no further specific complaints today.   REVIEW OF SYSTEMS:   Review of Systems  Constitutional: Positive for malaise/fatigue. Negative for fever.  Respiratory: Negative.  Negative for shortness of breath.   Cardiovascular: Negative.  Negative for chest pain.  Gastrointestinal: Positive for nausea and abdominal pain.  Genitourinary: Positive for flank pain.  Musculoskeletal: Negative.   Neurological: Negative.   Psychiatric/Behavioral: The patient is nervous/anxious.     As per HPI. Otherwise, a complete review of systems is negatve.  PAST MEDICAL HISTORY: Past Medical History  Diagnosis Date  . Abnormal EKG   . Coronary artery disease   . Diabetes mellitus without complication ([place])   . Obesity   . Hyperlipidemia   . Hypertension   . Sleep apnea     could not tolerate cpap mask  . Arthritis     oa  . Cancer [facility])     bladder and prostate  . History of radiation therapy 2012  . History of chemotherapy 2014  . Bladder cancer ([place])   . History  of chicken pox   . Myocardial infarction ([place]) 2008 and 2012    x 2    PAST SURGICAL HISTORY: Past Surgical History  Procedure Laterality Date  . Cardiac catheterization  12-06-2003  . Lad stent  2000 x1 stent, 2008 x 1 stent, 2012 x 1 stent  . Hernia repair  yrs ago  . Appendectomy  many yrs ago  . Rotator cuff repair Left yrs ago  . Seed implant to prostate with radiation  2012  . Protatectomy and urostomy  2014  . Total hip arthroplasty Right 12/27/2013    Procedure: RIGHT TOTAL HIP ARTHROPLASTY ANTERIOR APPROACH;  Surgeon: Mauri Pole, MD;  Location: [facility];  Service: Orthopedics;  Laterality: Right;  . Cardiac catheterization N/A 06/04/2015    Procedure: Left Heart Cath and Coronary Angiography;  Surgeon: Corey Skains, MD;  Location: [place] CV LAB;  Service: Cardiovascular;  Laterality: N/A;  . Cardiac catheterization N/A 06/04/2015    Procedure: Coronary Stent Intervention;  Surgeon: Wellington Hampshire, MD;  Location: [place] CV LAB;  Service: Cardiovascular;  Laterality: N/A;  . Peripheral vascular catheterization N/A 09/25/2015    Procedure: IVC Filter Insertion;  Surgeon: Katha Cabal, MD;  Location: [place] CV LAB;  Service: Cardiovascular;  Laterality: N/A;    FAMILY HISTORY: Reviewed and unchanged. No report of malignancy or chronic disease.     ADVANCED DIRECTIVES:    HEALTH MAINTENANCE: Social History  Substance Use Topics  . Smoking status: Never Smoker   . Smokeless tobacco: Never Used  . Alcohol Use:  0.0 oz/week    0 Standard drinks or equivalent per week     Comment: occasional     Colonoscopy:  PAP:  Bone density:  Lipid panel:  Allergies  Allergen Reactions  . Celebrex [Celecoxib]     Hematuria Other reaction(s): Bleeding Bleeding in bladder  . Effient [Prasugrel] Other (See Comments)    blood clots, blood in urine Other reaction(s): Bleeding Bleeding in bladder    Current Outpatient Prescriptions  Medication Sig  Dispense Refill  . apixaban (ELIQUIS) 5 MG TABS tablet Take 5 mg by mouth 2 (two) times daily.    [name] aspirin EC 81 MG tablet Take 81 mg by mouth daily.    [name] atenolol (TENORMIN) 25 MG tablet Take 25 mg by mouth daily.    . canagliflozin (INVOKANA) 300 MG TABS tablet Take 300 mg by mouth daily before breakfast. 30 tablet 3  . chlorproMAZINE (THORAZINE) 10 MG tablet Take 1 tablet (10 mg total) by mouth 3 (three) times daily as needed for hiccoughs. 60 tablet 3  . clopidogrel (PLAVIX) 75 MG tablet Take 1 tablet (75 mg total) by mouth daily. 30 tablet 6  . Coenzyme Q10 10 MG capsule Take 10 mg by mouth.    . fentaNYL (DURAGESIC - DOSED MCG/HR) 75 MCG/HR Place 1 patch (75 mcg total) onto the skin every 3 (three) days. 10 patch 0  . Fluticasone Furoate-Vilanterol (BREO ELLIPTA) 100-25 MCG/INH AEPB Inhale 1 Inhaler into the lungs daily.    . furosemide (LASIX) 20 MG tablet Take 20 mg by mouth 2 (two) times daily.    [name] glipiZIDE (GLUCOTROL) 10 MG tablet TAKE 1 TABLET(S) BY MOUTH DAILY 30 tablet 6  . metFORMIN (GLUCOPHAGE-XR) 500 MG 24 hr tablet 2 (TWO) TABLET, ORAL, TWO TIMES DAILY 120 tablet 11  . Multiple Vitamin (MULTIVITAMIN WITH MINERALS) TABS tablet Take 1 tablet by mouth daily.    . pantoprazole (PROTONIX) 40 MG tablet Take 40 mg by mouth 2 (two) times daily.    . potassium chloride (K-DUR) 10 MEQ tablet Take 10 mEq by mouth daily.    . prasugrel (EFFIENT) 10 MG TABS tablet Take 10 mg by mouth daily. Reported on 12/03/2015    . prochlorperazine (COMPAZINE) 10 MG tablet TAKE 1 TABLET BY MOUTH EVERY 6 HOURS AS NEEDED 40 tablet 0  . sotalol (BETAPACE) 80 MG tablet Take by mouth 2 (two) times daily.     No current facility-administered medications for this visit.   Facility-Administered Medications Ordered in Other Visits  Medication Dose Route Frequency Provider Last Rate Last Dose  . chlorhexidine (HIBICLENS) 4 % liquid 4 application  60 mL Topical Once Danae Orleans, PA-C       And  .  chlorhexidine (HIBICLENS) 4 % liquid 4 application  60 mL Topical Once Danae Orleans, PA-C      . sodium chloride 0.9 % injection 10 mL  10 mL Intracatheter PRN Lloyd Huger, MD   10 mL at 05/09/15 0900  . sodium chloride 0.9 % injection 3 mL  3 mL Intravenous PRN Lloyd Huger, MD      . sodium chloride flush (NS) 0.9 % injection 10 mL  10 mL Intravenous PRN Lloyd Huger, MD   10 mL at 12/25/15 0915    OBJECTIVE: Filed Vitals:   12/25/15 0921  BP: 144/92  Pulse: 61  Temp: 97.7 F (36.5 C)  Resp: 16     Body mass index is 26.67 kg/(m^2).    ECOG FS:1 -  Symptomatic but completely ambulatory  General: Well-developed, well-nourished, no acute distress. Eyes: anicteric sclera. Lungs: Clear to auscultation bilaterally. Heart: Regular rate and rhythm. No rubs, murmurs, or gallops. Abdomen: Soft, nontender, nondistended. No organomegaly noted, normoactive bowel sounds. Musculoskeletal: No edema, cyanosis, or clubbing. Neuro: Alert, answering all questions appropriately. Cranial nerves grossly intact. Skin: No rashes or petechiae noted. Psych: Normal affect.    LAB RESULTS:  Lab Results  Component Value Date   NA 131* 12/25/2015   K 4.3 12/25/2015   CL 97* 12/25/2015   CO2 26 12/25/2015   GLUCOSE 195* 12/25/2015   BUN 31* 12/25/2015   CREATININE 1.82* 12/25/2015   CALCIUM 9.4 12/25/2015   PROT 7.0 12/25/2015   ALBUMIN 3.8 12/25/2015   AST 17 12/25/2015   ALT 13* 12/25/2015   ALKPHOS 88 12/25/2015   BILITOT 0.5 12/25/2015   GFRNONAA 35* 12/25/2015   GFRAA 41* 12/25/2015    Lab Results  Component Value Date   WBC 9.8 12/25/2015   NEUTROABS 6.5 12/25/2015   HGB 11.3* 12/25/2015   HCT 33.8* 12/25/2015   MCV 89.4 12/25/2015   PLT 266 12/25/2015     STUDIES: Dg Chest 1 View  12/04/2015  CLINICAL DATA:  Post lung biopsy EXAM: CHEST 1 VIEW COMPARISON:  12/03/2015 FINDINGS: Cardiomediastinal silhouette is stable. Right IJ Port-A-Cath is unchanged in  position. Persistent cavitary mass in left upper lobe laterally. No infiltrate or pulmonary edema. There is no pneumothorax. IMPRESSION: No infiltrate or pulmonary edema. Persistent cavitary nodule in left upper lobe laterally. No pneumothorax. Electronically Signed   By: Lahoma Crocker M.D.   On: 12/04/2015 08:19   Nm Pet Image Restag (ps) Skull Base To Thigh  12/20/2015  CLINICAL DATA:  Subsequent treatment strategy for history of uroepithelial cancer. Pain. EXAM: NUCLEAR MEDICINE PET SKULL BASE TO THIGH TECHNIQUE: 12.1 mCi F-18 FDG was injected intravenously. Full-ring PET imaging was performed from the skull base to thigh after the radiotracer. CT data was obtained and used for attenuation correction and anatomic localization. FASTING BLOOD GLUCOSE:  Value: 127 mg/dl COMPARISON:  11/06/2015. FINDINGS: NECK No areas of abnormal hypermetabolism. CHEST New or increased multi focal right pleural hypermetabolism corresponding to mild thickening. Example measuring 7 mm and a S.U.V. max of 4.0 on image 82/series 3. This measured a S.U.V. max of 2.1 on the prior. Left upper lobe partially cavitary nodule measures 2.5 cm and a S.U.V. max of 2.6 on image 93/ series 3. Compare 2.8 cm and a S.U.V. max of 3.7 on the prior exam. Subcarinal node measures 1.0 cm and a S.U.V. max of 13.7 on image 97/series 3. Compare 9 mm and 11.7 on the prior. Newly hypermetabolic right cardiophrenic angle node measuring 7 mm and a S.U.V. max of 3.1. ABDOMEN/PELVIS Multifocal hepatic metastasis. Index anterior right hepatic lobe lesion measures 3.5 cm and a S.U.V. max of 18.9 on image 121/series 3. Compare similar in size and a S.U.V. max of 15.9 on the prior. Index left hepatic lobe lesion measures 2.8 cm and a S.U.V. max of 18.4 today versus similar in size and an SUV of 18.8 on the prior. Retroperitoneal hypermetabolic nodes, including index node in the left periaortic space which measures 7 mm and a S.U.V. max of 3.8. Similar in size and  hypermetabolism on the prior. Pelvic adenopathy, including a left inguinal node which measures 1.5 cm and a S.U.V. max of 21.2. Compare similar in size and 20.3 on the prior. Presumed primary within the penis measures a S.U.V.  max of 27.0 versus a S.U.V. max of 26 on the prior. SKELETON Osseous metastasis. Right iliac index lesion measures a S.U.V. max of 10.8 today versus a S.U.V. max of 8.6. CT IMAGES PERFORMED FOR ATTENUATION CORRECTION No significant findings within the neck. A right Port-A-Cath which terminates at the cavoatrial junction. Mild cardiomegaly with dense coronary artery atherosclerosis. Areas of left major fissure thickening are similar, including on image 74/series 3. Fatty atrophy throughout the pancreas. Normal adrenal glands. Too small to characterize interpolar right renal lesion. No hydronephrosis. IVC filter. 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 Prostatectomy. IMPRESSION: 1. Mild disease progression, as evidenced by new or increased right pleural metastasis, new right cardiophrenic angle hypermetabolic node. 2. The majority of sites of disease, including left upper lobe, liver, abdominal pelvic nodes, bones and presumed penile primary are similar to minimally progressive, as detailed above Electronically Signed   By: Abigail Miyamoto M.D.   On: 12/20/2015 11:05   Ct Biopsy  12/19/2015  ADDENDUM REPORT: 12/19/2015 15:28 ADDENDUM: While patient was under moderate sedation, he was monitored throughout the entire procedure by the Radiology nurse. Electronically Signed   By: Marijo Conception, M.D.   On: 12/19/2015 15:28  12/19/2015  CLINICAL DATA:  Left upper lobe lung mass. Current history of urothelial carcinoma. EXAM: CT GUIDED core BIOPSY OF left upper lobe lung mass. ANESTHESIA/SEDATION: 1.0  Mg IV Versed; 50 mcg IV Fentanyl Total Moderate Sedation Time: 25 minutes. PROCEDURE: The procedure risks, benefits, and alternatives were explained to the patient.  Questions regarding the procedure were encouraged and answered. The patient understands and consents to the procedure. The left upper chest wall was prepped with chlorhexidinein a sterile fashion, and a sterile drape was applied covering the operative field. Sterile gloves were used for the procedure. Local anesthesia was provided with 1% Lidocaine. Under CT guidance, 17 gauge guiding needle was directed toward left upper lobe lung mass. Three core samples were obtained using 18 gauge biopsy needle. The samples were placed in formalin vial and delivered to pathology. Needle was removed with the simultaneous injection of approximately 10 cc of the patient's own blood drawn prior to procedure to serve as patch. Appropriate dressing was applied. Complications: None immediate. No pneumothorax seen on follow-up CT imaging. Follow-up chest radiograph will be obtained in 2 hours. FINDINGS: Left upper lobe lung mass as described above. IMPRESSION: Under CT guidance, percutaneous biopsy of left upper lobe lung mass was performed. Electronically Signed: By: Marijo Conception, M.D. 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  PLAN:    1.  Urothelial carcinoma:  PET scan results from December 20, 2015 reviewed independently and reported as above with at least stable disease Because of his increased pain, proceed with cycle 5, day 1 of cisplatin and gemcitabine today. Return to clinic in 1 week for consideration of cycle 5, day 8 which will be gemcitabine only. If patient progresses can consider treatment with atezolizumab or nivolumab.  2.  Lung nodule: Pathology result reviewed independently with hyphae noted on sample. Appreciate infectious disease input. Patient currently taking voriconazole and will require treatment for at least 2-3 months.  3. Pain: Likely secondary to malignancy. Patient previously had XRT to his pelvic area. Continue current narcotic regimen  as prescribed. 4. Hyperglycemia: Patient is diabetic. Continue glipizide and metformin as prescribed. Monitor. 5. Anemia: Mild, monitor. 6. Atrial fibrillation: Patient's heart is in regular rhythm today. Continue treatment  and follow up with cardiology next week. 7. Pulmonary embolism: Continue Eliquis as prescribed. Patient also has an IVC filter in place. 8. Nausea: Monitor, continue current medications as prescribed.  Patient expressed understanding and was in agreement with this plan. He also understands that He can call clinic at any time with any questions, concerns, or complaints.   Urothelial cancer   Staging form: Kidney, AJCC 7th Edition     Clinical stage from 03/16/2015: Stage IV (TX, N1, M1) - Signed by Lloyd Huger, MD on 03/16/2015  Lloyd Huger, MD   12/30/2015 6:56 AM

## 2016-01-01 ENCOUNTER — Inpatient Hospital Stay: Payer: PPO

## 2016-01-01 ENCOUNTER — Inpatient Hospital Stay (HOSPITAL_BASED_OUTPATIENT_CLINIC_OR_DEPARTMENT_OTHER): Payer: PPO | Admitting: Oncology

## 2016-01-01 VITALS — BP 148/88 | HR 65 | Temp 98.2°F | Resp 18 | Wt 183.4 lb

## 2016-01-01 DIAGNOSIS — M129 Arthropathy, unspecified: Secondary | ICD-10-CM

## 2016-01-01 DIAGNOSIS — Z79899 Other long term (current) drug therapy: Secondary | ICD-10-CM

## 2016-01-01 DIAGNOSIS — E669 Obesity, unspecified: Secondary | ICD-10-CM

## 2016-01-01 DIAGNOSIS — Z86711 Personal history of pulmonary embolism: Secondary | ICD-10-CM

## 2016-01-01 DIAGNOSIS — C782 Secondary malignant neoplasm of pleura: Secondary | ICD-10-CM

## 2016-01-01 DIAGNOSIS — I2510 Atherosclerotic heart disease of native coronary artery without angina pectoris: Secondary | ICD-10-CM

## 2016-01-01 DIAGNOSIS — R531 Weakness: Secondary | ICD-10-CM

## 2016-01-01 DIAGNOSIS — K435 Parastomal hernia without obstruction or  gangrene: Secondary | ICD-10-CM

## 2016-01-01 DIAGNOSIS — Z7984 Long term (current) use of oral hypoglycemic drugs: Secondary | ICD-10-CM

## 2016-01-01 DIAGNOSIS — Z9221 Personal history of antineoplastic chemotherapy: Secondary | ICD-10-CM

## 2016-01-01 DIAGNOSIS — C689 Malignant neoplasm of urinary organ, unspecified: Secondary | ICD-10-CM

## 2016-01-01 DIAGNOSIS — Z923 Personal history of irradiation: Secondary | ICD-10-CM

## 2016-01-01 DIAGNOSIS — C787 Secondary malignant neoplasm of liver and intrahepatic bile duct: Secondary | ICD-10-CM | POA: Diagnosis not present

## 2016-01-01 DIAGNOSIS — F419 Anxiety disorder, unspecified: Secondary | ICD-10-CM

## 2016-01-01 DIAGNOSIS — C7951 Secondary malignant neoplasm of bone: Secondary | ICD-10-CM | POA: Diagnosis not present

## 2016-01-01 DIAGNOSIS — Z8546 Personal history of malignant neoplasm of prostate: Secondary | ICD-10-CM

## 2016-01-01 DIAGNOSIS — R110 Nausea: Secondary | ICD-10-CM

## 2016-01-01 DIAGNOSIS — R5383 Other fatigue: Secondary | ICD-10-CM

## 2016-01-01 DIAGNOSIS — E1165 Type 2 diabetes mellitus with hyperglycemia: Secondary | ICD-10-CM

## 2016-01-01 DIAGNOSIS — R109 Unspecified abdominal pain: Secondary | ICD-10-CM

## 2016-01-01 DIAGNOSIS — R590 Localized enlarged lymph nodes: Secondary | ICD-10-CM

## 2016-01-01 DIAGNOSIS — E785 Hyperlipidemia, unspecified: Secondary | ICD-10-CM

## 2016-01-01 DIAGNOSIS — I252 Old myocardial infarction: Secondary | ICD-10-CM

## 2016-01-01 DIAGNOSIS — D649 Anemia, unspecified: Secondary | ICD-10-CM

## 2016-01-01 DIAGNOSIS — Z7982 Long term (current) use of aspirin: Secondary | ICD-10-CM

## 2016-01-01 DIAGNOSIS — I10 Essential (primary) hypertension: Secondary | ICD-10-CM

## 2016-01-01 DIAGNOSIS — I4891 Unspecified atrial fibrillation: Secondary | ICD-10-CM

## 2016-01-01 DIAGNOSIS — Z7901 Long term (current) use of anticoagulants: Secondary | ICD-10-CM

## 2016-01-01 DIAGNOSIS — Z5111 Encounter for antineoplastic chemotherapy: Secondary | ICD-10-CM | POA: Diagnosis not present

## 2016-01-01 DIAGNOSIS — G4730 Sleep apnea, unspecified: Secondary | ICD-10-CM

## 2016-01-01 LAB — COMPREHENSIVE METABOLIC PANEL
ALK PHOS: 87 U/L (ref 38–126)
ALT: 15 U/L — ABNORMAL LOW (ref 17–63)
ANION GAP: 7 (ref 5–15)
AST: 16 U/L (ref 15–41)
Albumin: 3.8 g/dL (ref 3.5–5.0)
BUN: 27 mg/dL — ABNORMAL HIGH (ref 6–20)
CALCIUM: 9.4 mg/dL (ref 8.9–10.3)
CO2: 25 mmol/L (ref 22–32)
Chloride: 99 mmol/L — ABNORMAL LOW (ref 101–111)
Creatinine, Ser: 1.42 mg/dL — ABNORMAL HIGH (ref 0.61–1.24)
GFR calc non Af Amer: 47 mL/min — ABNORMAL LOW (ref >60)
GFR, EST AFRICAN AMERICAN: 55 mL/min — AB (ref >60)
Glucose, Bld: 215 mg/dL — ABNORMAL HIGH (ref 65–99)
Potassium: 4.8 mmol/L (ref 3.5–5.1)
SODIUM: 131 mmol/L — AB (ref 135–145)
TOTAL PROTEIN: 7.3 g/dL (ref 6.5–8.1)
Total Bilirubin: 0.5 mg/dL (ref 0.3–1.2)

## 2016-01-01 LAB — CBC WITH DIFFERENTIAL/PLATELET
Basophils Absolute: 0 10*3/uL (ref 0–0.1)
Basophils Relative: 1 %
EOS ABS: 0.1 10*3/uL (ref 0–0.7)
EOS PCT: 3 %
HCT: 33.8 % — ABNORMAL LOW (ref 40.0–52.0)
HEMOGLOBIN: 11.4 g/dL — AB (ref 13.0–18.0)
LYMPHS ABS: 1.4 10*3/uL (ref 1.0–3.6)
Lymphocytes Relative: 30 %
MCH: 30.1 pg (ref 26.0–34.0)
MCHC: 33.9 g/dL (ref 32.0–36.0)
MCV: 88.8 fL (ref 80.0–100.0)
MONOS PCT: 6 %
Monocytes Absolute: 0.3 10*3/uL (ref 0.2–1.0)
NEUTROS PCT: 60 %
Neutro Abs: 2.8 10*3/uL (ref 1.4–6.5)
Platelets: 150 10*3/uL (ref 150–440)
RBC: 3.8 MIL/uL — ABNORMAL LOW (ref 4.40–5.90)
RDW: 15 % — ABNORMAL HIGH (ref 11.5–14.5)
WBC: 4.7 10*3/uL (ref 3.8–10.6)

## 2016-01-01 MED ORDER — HEPARIN SOD (PORK) LOCK FLUSH 100 UNIT/ML IV SOLN
500.0000 [IU] | Freq: Once | INTRAVENOUS | Status: AC | PRN
  Administered 2016-01-01: 500 [IU]
  Filled 2016-01-01: qty 5

## 2016-01-01 MED ORDER — MORPHINE SULFATE (PF) 2 MG/ML IV SOLN
INTRAVENOUS | Status: AC
  Filled 2016-01-01: qty 2

## 2016-01-01 MED ORDER — SODIUM CHLORIDE 0.9 % IV SOLN
1000.0000 mg/m2 | Freq: Once | INTRAVENOUS | Status: AC
  Administered 2016-01-01: 2128 mg via INTRAVENOUS
  Filled 2016-01-01: qty 55.97

## 2016-01-01 MED ORDER — PALONOSETRON HCL INJECTION 0.25 MG/5ML
0.2500 mg | Freq: Once | INTRAVENOUS | Status: AC
  Administered 2016-01-01: 0.25 mg via INTRAVENOUS
  Filled 2016-01-01: qty 5

## 2016-01-01 MED ORDER — SODIUM CHLORIDE 0.9 % IV SOLN
Freq: Once | INTRAVENOUS | Status: AC
  Administered 2016-01-01: 14:00:00 via INTRAVENOUS
  Filled 2016-01-01: qty 5

## 2016-01-01 MED ORDER — SODIUM CHLORIDE 0.9 % IV SOLN
35.0000 mg/m2 | Freq: Once | INTRAVENOUS | Status: AC
  Administered 2016-01-01: 75 mg via INTRAVENOUS
  Filled 2016-01-01: qty 75

## 2016-01-01 MED ORDER — FENTANYL 100 MCG/HR TD PT72: 100.0000 ug | MEDICATED_PATCH | TRANSDERMAL | Status: DC

## 2016-01-01 MED ORDER — ONDANSETRON HCL 8 MG PO TABS: 8.0000 mg | ORAL_TABLET | Freq: Three times a day (TID) | ORAL | Status: DC | PRN

## 2016-01-01 MED ORDER — MORPHINE SULFATE (PF) 4 MG/ML IV SOLN
4.0000 mg | INTRAVENOUS | Status: DC | PRN
Start: 2016-01-01 — End: 2016-01-11
  Administered 2016-01-01 (×2): 4 mg via INTRAVENOUS

## 2016-01-01 MED ORDER — POTASSIUM CHLORIDE 2 MEQ/ML IV SOLN
Freq: Once | INTRAVENOUS | Status: AC
  Administered 2016-01-01: 11:00:00 via INTRAVENOUS
  Filled 2016-01-01: qty 1000

## 2016-01-01 MED ORDER — SODIUM CHLORIDE 0.9 % IV SOLN
Freq: Once | INTRAVENOUS | Status: AC
  Administered 2016-01-01: 11:00:00 via INTRAVENOUS
  Filled 2016-01-01: qty 1000

## 2016-01-01 NOTE — Progress Notes (Signed)
Patient's pain is 7-8/10 on pain scale today.  The Fentanyl patch does help relieve the pain but only for 2 days and today is day 3 so the pain is worse.  Also has nausea that is not relieved with the Compazine that is prescribed.

## 2016-01-02 ENCOUNTER — Encounter: Payer: PPO | Admitting: *Deleted

## 2016-01-02 DIAGNOSIS — I214 Non-ST elevation (NSTEMI) myocardial infarction: Secondary | ICD-10-CM

## 2016-01-02 NOTE — Progress Notes (Signed)
Daily Session Note  Patient Details  Name: Louis Alexander MRN: [id number] Date of Birth: April 17, 1941 Referring Provider:  Dionisio David, MD  Encounter Date: 01/02/2016  Check In:     Session Check In - 01/02/16 0930    Check-In   Location [facility]   Staff Present Carson Myrtle, BS, RRT, Respiratory Therapist;Loyed Wilmes Dillard Essex, MS, ACSM CEP, Exercise Physiologist;Susanne Bice, RN, BSN, CCRP   Supervising physician immediately available to respond to emergencies See telemetry face sheet for immediately available ER MD   Medication changes reported     No   Fall or balance concerns reported    No   Warm-up and Cool-down Not performed (comment)  Did not stay for exercise   Resistance Training Performed No   VAD Patient? No   Pain Assessment   Currently in Pain? No/denies   Multiple Pain Sites No         Goals Met:  Did not stay for exercise due to chemo infusion yesterday and not feeling well today  Goals Unmet:  No exercise today  Goals Comments: No exercise, no visit charge.   Dr. Emily Filbert is Medical Director for [place] and [facility].

## 2016-01-08 NOTE — Progress Notes (Signed)
Cardiac Individual Treatment Plan  Patient Details  Name: Louis Alexander MRN: [id number] Date of Birth: 10/28/1941 Referring Provider:  Dionisio David, MD  Initial Encounter Date:    Visit Diagnosis: NSTEMI (non-ST elevated myocardial infarction) [facility])  Patient's Home Medications on Admission:  Current outpatient prescriptions:  .  apixaban (ELIQUIS) 5 MG TABS tablet, Take 5 mg by mouth 2 (two) times daily., Disp: , Rfl:  .  aspirin EC 81 MG tablet, Take 81 mg by mouth daily., Disp: , Rfl:  .  atenolol (TENORMIN) 25 MG tablet, Take 25 mg by mouth daily., Disp: , Rfl:  .  canagliflozin (INVOKANA) 300 MG TABS tablet, Take 300 mg by mouth daily before breakfast., Disp: 30 tablet, Rfl: 3 .  chlorproMAZINE (THORAZINE) 10 MG tablet, Take 1 tablet (10 mg total) by mouth 3 (three) times daily as needed for hiccoughs., Disp: 60 tablet, Rfl: 3 .  clopidogrel (PLAVIX) 75 MG tablet, Take 1 tablet (75 mg total) by mouth daily., Disp: 30 tablet, Rfl: 6 .  Coenzyme Q10 10 MG capsule, Take 10 mg by mouth., Disp: , Rfl:  .  fentaNYL (DURAGESIC - DOSED MCG/HR) 100 MCG/HR, Place 1 patch (100 mcg total) onto the skin every 3 (three) days., Disp: 10 patch, Rfl: 0 .  Fluticasone Furoate-Vilanterol (BREO ELLIPTA) 100-25 MCG/INH AEPB, Inhale 1 Inhaler into the lungs daily., Disp: , Rfl:  .  furosemide (LASIX) 20 MG tablet, Take 20 mg by mouth 2 (two) times daily., Disp: , Rfl:  .  glipiZIDE (GLUCOTROL) 10 MG tablet, TAKE 1 TABLET(S) BY MOUTH DAILY, Disp: 30 tablet, Rfl: 6 .  metFORMIN (GLUCOPHAGE-XR) 500 MG 24 hr tablet, 2 (TWO) TABLET, ORAL, TWO TIMES DAILY, Disp: 120 tablet, Rfl: 11 .  Multiple Vitamin (MULTIVITAMIN WITH MINERALS) TABS tablet, Take 1 tablet by mouth daily., Disp: , Rfl:  .  ondansetron (ZOFRAN) 8 MG tablet, Take 1 tablet (8 mg total) by mouth every 8 (eight) hours as needed for nausea or vomiting., Disp: 60 tablet, Rfl: 2 .  pantoprazole (PROTONIX) 40 MG tablet, Take 40 mg by mouth 2 (two)  times daily., Disp: , Rfl:  .  potassium chloride (K-DUR) 10 MEQ tablet, Take 10 mEq by mouth daily., Disp: , Rfl:  .  prasugrel (EFFIENT) 10 MG TABS tablet, Take 10 mg by mouth daily. Reported on 12/03/2015, Disp: , Rfl:  .  prochlorperazine (COMPAZINE) 10 MG tablet, TAKE 1 TABLET BY MOUTH EVERY 6 HOURS AS NEEDED, Disp: 40 tablet, Rfl: 0 .  sotalol (BETAPACE) 80 MG tablet, Take by mouth 2 (two) times daily., Disp: , Rfl:  No current facility-administered medications for this visit.  Facility-Administered Medications Ordered in Other Visits:  .  Shower Chin To Toes With 60 mL chlorhexidine (HIBICLENS) the night before surgery, , , Once **AND** Shower Chin To Toes With 60 mL chlorhexidine (HIBICLENS) in AM of surgery after pre-op clip completed, , , Once **AND** chlorhexidine (HIBICLENS) 4 % liquid 4 application, 60 mL, Topical, Once **AND** chlorhexidine (HIBICLENS) 4 % liquid 4 application, 60 mL, Topical, Once, Danae Orleans, PA-C .  morphine 4 MG/ML injection 4 mg, 4 mg, Intravenous, Q2H PRN, Lloyd Huger, MD, 4 mg at 01/01/16 1320 .  sodium chloride 0.9 % injection 10 mL, 10 mL, Intracatheter, PRN, Lloyd Huger, MD, 10 mL at 05/09/15 0900 .  sodium chloride 0.9 % injection 3 mL, 3 mL, Intravenous, PRN, Lloyd Huger, MD .  sodium chloride flush (NS) 0.9 % injection 10 mL,  10 mL, Intravenous, PRN, Lloyd Huger, MD, 10 mL at 12/25/15 0915  Past Medical History: Past Medical History  Diagnosis Date  . Abnormal EKG   . Coronary artery disease   . Diabetes mellitus without complication ([place])   . Obesity   . Hyperlipidemia   . Hypertension   . Sleep apnea     could not tolerate cpap mask  . Arthritis     oa  . Cancer [facility])     bladder and prostate  . History of radiation therapy 2012  . History of chemotherapy 2014  . Bladder cancer ([place])   . History of chicken pox   . Myocardial infarction ([place]) 2008 and 2012    x 2    Tobacco Use: History  Smoking status   . Never Smoker   Smokeless tobacco  . Never Used    Labs: Recent Review Flowsheet Data    Labs for ITP Cardiac and Pulmonary Rehab Latest Ref Rng 10/26/2012 12/22/2014 06/12/2015 09/12/2015   Cholestrol 0 - 200 mg/dL 134 189 - -   LDLCALC - 41 115 - -   HDL 35 - 70 mg/dL 36(L) 41 - -   Trlycerides 40 - 160 mg/dL 284(H) 165(A) - -   Hemoglobin A1c - - 7.2(A) 7.7 9.2       Exercise Target Goals:    Exercise Program Goal: Individual exercise prescription set with THRR, safety & activity barriers. Participant demonstrates ability to understand and report RPE using BORG scale, to self-measure pulse accurately, and to acknowledge the importance of the exercise prescription.  Exercise Prescription Goal: Starting with aerobic activity 30 plus minutes a day, 3 days per week for initial exercise prescription. Provide home exercise prescription and guidelines that participant acknowledges understanding prior to discharge.  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   urostomy tube    Cardiac Risk Stratification High      6 Minute Walk:     6 Minute Walk      07/31/15 1140       6 Minute Walk   Phase Initial     Distance 850 feet     Walk Time 6 minutes     RPE 12     Symptoms No     Resting HR 76 bpm     Resting BP 124/70 mmHg     Max Ex. HR 94 bpm     Max Ex. BP 140/66 mmHg        Initial Exercise Prescription:     Initial Exercise Prescription - 07/31/15 1100    Date of Initial Exercise Prescription   Date 07/31/15   Treadmill   MPH 1.6   Grade 0   Minutes 10   Bike   Level 0.4   Minutes 10   Recumbant Bike   Level 3   RPM 40   Watts 25   Minutes 10   NuStep   Level 2   Watts 35   Minutes 15   Arm Ergometer   Level 1   Watts 10    Minutes 10   Arm/Foot Ergometer   Level 4   Watts 12   Minutes 10   Cybex   Level 3   RPM 50   Minutes 10   Recumbant Elliptical  Level 1   RPM 40   Watts 10   Minutes 10   Elliptical   Level 1   Speed 3   Minutes 1   REL-XR   Level 2   Watts 30   Minutes 10   Prescription Details   Frequency (times per week) 3   Duration Progress to 30 minutes of continuous aerobic without signs/symptoms of physical distress   Intensity   THRR REST +  30   Ratings of Perceived Exertion 11-15   Progression Continue progressive overload as per policy without signs/symptoms or physical distress.   Resistance Training   Training Prescription Yes   Weight 2   Reps 10-15      Exercise Prescription Changes:     Exercise Prescription Changes      08/21/15 0700 08/22/15 0700 09/18/15 0700 10/15/15 1400 11/13/15 0700   Exercise Review   Progression --  Only two visits recorded No  Only 2 visits recorded and will be out for several weeks No  Absent since last review; last visit 08/06/15 No  Absent since last review; last visit 08/06/15 No  Absent since last review; last visit 08/06/15   Response to Exercise   Blood Pressure (Admit)  140/80 mmHg      Blood Pressure (Exercise)  168/82 mmHg      Blood Pressure (Exit)  134/72 mmHg      Heart Rate (Admit)  71 bpm      Heart Rate (Exercise)  95 bpm      Heart Rate (Exit)  75 bpm      Rating of Perceived Exertion (Exercise)  12      Symptoms  Tired      Comments  [name] is going to be out for several weeks due to the cancer that he also has. His workloads may need to be reevaluated upon his return.   Exercise workloads will need to be reviewed upon his return based on his exercise capacity.    Duration  Progress to 30 minutes of continuous aerobic without signs/symptoms of physical distress Progress to 30 minutes of continuous aerobic without signs/symptoms of physical distress Progress to 30 minutes of continuous aerobic without signs/symptoms of  physical distress Progress to 30 minutes of continuous aerobic without signs/symptoms of physical distress   Intensity  Rest + 30 Rest + 30 Rest + 30 Rest + 30   Progression  Continue progressive overload as per policy without signs/symptoms or physical distress. Continue progressive overload as per policy without signs/symptoms or physical distress. Continue progressive overload as per policy without signs/symptoms or physical distress. Continue progressive overload as per policy without signs/symptoms or physical distress.   Resistance Training   Training Prescription  Yes Yes Yes Yes   Weight  2 2 2 2    Reps  10-15 10-15 10-15 10-15   Interval Training   Interval Training  No No No No   Treadmill   MPH  1.5 1.5 1.5 1.5   Grade  0 0 0 0   Minutes  10 10 10 10    REL-XR   Level  2 2 2 2    Watts  40 40 40 40   Minutes  15 15 15 15      11/30/15 0733 12/12/15 0800 12/26/15 0800 12/28/15 1027     Exercise Review   Progression Yes   Yes    Response to Exercise   Blood Pressure (Admit) 138/82 mmHg   150/78 mmHg    Blood  Pressure (Exercise) 130/74 mmHg   148/80 mmHg    Blood Pressure (Exit) 114/66 mmHg   118/70 mmHg    Heart Rate (Admit) 74 bpm   88 bpm    Heart Rate (Exercise) 88 bpm   86 bpm    Heart Rate (Exit) 72 bpm   70 bpm    Rating of Perceived Exertion (Exercise) 13   13    Symptoms None None None None    Comments [name] is actually in good fitness for being out for so long and feels a lot better. He made some increases this month and is encouraged by how well he feels. He is very compliant to exercise suggestions and willing to progress.  Reviewed individualized exercise prescription and made increases per departmental policy. Exercise increases were discussed with the patient and they were able to perform the new work loads without issue (no signs or symptoms).  [name] continues to progress despite ongoing health issues. [name] is going to try to do some activity at home on days he is not in  class. He will start with one day a week of doing light activity at home.    Frequency    Add 1 additional day to program exercise sessions.    Duration Progress to 30 minutes of continuous aerobic without signs/symptoms of physical distress Progress to 30 minutes of continuous aerobic without signs/symptoms of physical distress Progress to 50 minutes of aerobic without signs/symptoms of physical distress Progress to 50 minutes of aerobic without signs/symptoms of physical distress    Intensity Rest + 30 Rest + 30 Rest + 30 Rest + 30    Progression Continue progressive overload as per policy without signs/symptoms or physical distress. Continue progressive overload as per policy without signs/symptoms or physical distress. Continue progressive overload as per policy without signs/symptoms or physical distress. Continue progressive overload as per policy without signs/symptoms or physical distress.    Resistance Training   Training Prescription Yes Yes Yes Yes    Weight 2 2 2 2     Reps 10-15 10-15 10-15 10-15    Interval Training   Interval Training No No No No    Treadmill   MPH 1.7 1.7 1.7 1.7    Grade 0 0 0 0    Minutes 15 15 15 15     REL-XR   Level 5 6 7 7     Watts 60 60 60 60    Minutes 15 15 20 20     Biostep-RELP   Level 5 5 5 7     Watts 45 45 45 40    Minutes 15 15 15 15        Discharge Exercise Prescription (Final Exercise Prescription Changes):     Exercise Prescription Changes - 12/28/15 1027    Exercise Review   Progression Yes   Response to Exercise   Blood Pressure (Admit) 150/78 mmHg   Blood Pressure (Exercise) 148/80 mmHg   Blood Pressure (Exit) 118/70 mmHg   Heart Rate (Admit) 88 bpm   Heart Rate (Exercise) 86 bpm   Heart Rate (Exit) 70 bpm   Rating of Perceived Exertion (Exercise) 13   Symptoms None   Comments [name] continues to progress despite ongoing health issues. [name] is going to try to do some activity at home on days he is not in class. He will start with  one day a week of doing light activity at home.   Frequency Add 1 additional day to program exercise sessions.   Duration Progress  to 50 minutes of aerobic without signs/symptoms of physical distress   Intensity Rest + 30   Progression Continue progressive overload as per policy without signs/symptoms or physical distress.   Resistance Training   Training Prescription Yes   Weight 2   Reps 10-15   Interval Training   Interval Training No   Treadmill   MPH 1.7   Grade 0   Minutes 15   REL-XR   Level 7   Watts 60   Minutes 20   Biostep-RELP   Level 7   Watts 40   Minutes 15      Nutrition:  Target Goals: Understanding of nutrition guidelines, daily intake of sodium <1555m, cholesterol <2065m calories 30% from fat and 7% or less from saturated fats, daily to have 5 or more servings of fruits and vegetables.  Biometrics:     Pre Biometrics - 07/31/15 1136    Pre Biometrics   Height 5' 10.5" (1.791 m)   Weight 203 lb 4.8 oz (92.216 kg)   Waist Circumference 40.25 inches   Hip Circumference 44.5 inches   Waist to Hip Ratio 0.9 %   BMI (Calculated) 28.8       Nutrition Therapy Plan and Nutrition Goals:     Nutrition Therapy & Goals - 12/07/15 0931    Personal Nutrition Goals   Personal Goal #1 DoTimmothy Sourss still doing well with his heart healthy eating and has no concerns or questions      Nutrition Discharge: Rate Your Plate Scores:   Nutrition Goals Re-Evaluation:     Nutrition Goals Re-Evaluation      08/23/15 1536 10/09/15 1333         Personal Goal #1 Re-Evaluation   Personal Goal #1 DoTajas been out of Cardiac Rehab since 08/06/2015 since he told DiRoanna Epleyhat "exercising is antogonizing his tumor/cancer".  Do[place] said he wants to return to Cardiac REhab in about a month. He is currently getting a cancer treatment today. [place].         Psychosocial: Target Goals: Acknowledge presence or absence of  depression, maximize coping skills, provide positive support system. Participant is able to verbalize types and ability to use techniques and skills needed for reducing stress and depression.  Initial Review & Psychosocial Screening:     Initial Psych Review & Screening - 08/01/15 [id number]   Screening Interventions   Interventions Encouraged to exercise;Program counselor consult      Quality of Life Scores:     Quality of Life - 08/01/15 1208    Quality of Life Scores   Health/Function Pre 19.87 %   Socioeconomic Pre 24.21 %   Psych/Spiritual Pre 24 %   Family Pre 24.8 %   GLOBAL Pre 22.93 %      PHQ-9:     Recent Review Flowsheet Data    [facility] 07/31/2015 05/03/2015   Decreased Interest 0 0   Down, Depressed, Hopeless 0 0   PHQ - 2 Score 0 0   Altered sleeping 3 -   Tired, decreased energy 1 -   Change in appetite 0 -   Feeling bad or failure about yourself  0 -   Trouble concentrating 0 -   Moving slowly or fidgety/restless 1 -   Suicidal thoughts 0 -   PHQ-9 Score 5 -   Difficult doing work/chores Not difficult at all -  Psychosocial Evaluation and Intervention:     Psychosocial Evaluation - 12/12/15 1037    Psychosocial Evaluation & Interventions   Interventions Therapist referral;Encouraged to exercise with the program and follow exercise prescription;Stress management education   Comments Counselor met with Mr. Ayars for initial psychosocical evaluation.  Mr. Mamie Nick has been attending this program for awhile, but is on a different schedule than counselor, so hard to connect.  Mr. Mamie Nick is a 75 year old who had his 4th stent inserted several months ago.  He has a strong support system with a spouse of 38 years, several adult children close by and active involvement in a local church community.  Mr. Mamie Nick has other health issues that are impacting his recovery as he is currently on Chemotherapy due to  several cancer spots detected in his body.  Mr. Mamie Nick states he sleeps okay (a little better lately) and his appetite is "not good" due to the Chemo.  He denies a history of depression or anxiety, but does admit some current symptoms with sadness and tearfulness more lately.  Mr. Mamie Nick reported his mood is a "3" on a scale of 1-10, with "10" being best.  Mr. Mamie Nick states his mood was better several weeks ago, until he learned about additional "spots" of cancer he was not aware of at the time.  He admits his health, trying to get the house sold, and finances are the biggest stressors for him currently.  He had goals for increased energy for this program and has already experienced that, with Mr. Mamie Nick reporting being able to walk faster and further lately.  He plans to join a gym to continue working out consistently upon completion of this program.  Counselor recommended Mr. Mamie Nick see a local therapist to address his mood and provide supportive services during this time with so many stressors and health issues impacting his functioning.  Contact information for the therapist was provided.     Continued Psychosocial Services Needed --  Counselor will follow up with recommendation for Mr. Mamie Nick to see a counselor, as well as participation in stress management  psychoeducational component of this program.        Psychosocial Re-Evaluation:     Psychosocial Re-Evaluation      08/23/15 1537 08/29/15 1157 10/09/15 1342       Psychosocial Re-Evaluation   Interventions Encouraged to attend Cardiac Rehabilitation for the exercise Stress management education      Comments Many stressors including -Fontaine has been out of Cardiac Rehab since 08/06/2015 since he told Roanna Epley that "exercising is antogonizing his tumor/cancer".  Brigham called [place] and said he wants to return to Cardiac REhab in about a month. He is currently getting a cancer treatment today. [place] has a counselor for their patients also. Avir was also  reported he was in the hospital for Blood clot        Vocational Rehabilitation: Provide vocational rehab assistance to qualifying candidates.   Vocational Rehab Evaluation & Intervention:     Vocational Rehab - 07/31/15 1102    Initial Vocational Rehab Evaluation & Intervention   Assessment shows need for Vocational Rehabilitation No      Education: Education Goals: Education classes will be provided on a weekly basis, covering required topics. Participant will state understanding/return demonstration of topics presented.  Learning Barriers/Preferences:     Learning Barriers/Preferences - 07/31/15 1102    Learning Barriers/Preferences   Learning Barriers None   Learning Preferences None  Education Topics: General Nutrition Guidelines/Fats and Fiber: -Group instruction provided by verbal, written material, models and posters to present the general guidelines for heart healthy nutrition. Gives an explanation and review of dietary fats and fiber.   Controlling Sodium/Reading Food Labels: -Group verbal and written material supporting the discussion of sodium use in heart healthy nutrition. Review and explanation with models, verbal and written materials for utilization of the food label.   Exercise Physiology & Risk Factors: - Group verbal and written instruction with models to review the exercise physiology of the cardiovascular system and associated critical values. Details cardiovascular disease risk factors and the goals associated with each risk factor.          Cardiac Rehab from 12/26/2015 in [facility] Cardiac and Pulmonary Rehab   Date  11/21/15   Educator  RM   Instruction Review Code  2- meets goals/outcomes      Aerobic Exercise & Resistance Training: - Gives group verbal and written discussion on the health impact of inactivity. On the components of aerobic and resistive training programs and the benefits of this training and how to safely progress through  these programs.   Flexibility, Balance, General Exercise Guidelines: - Provides group verbal and written instruction on the benefits of flexibility and balance training programs. Provides general exercise guidelines with specific guidelines to those with heart or lung disease. Demonstration and skill practice provided.      Cardiac Rehab from 12/26/2015 in [facility] Cardiac and Pulmonary Rehab   Date  08/06/15   Educator  [facility]   Instruction Review Code  2- meets goals/outcomes      Stress Management: - Provides group verbal and written instruction about the health risks of elevated stress, cause of high stress, and healthy ways to reduce stress.   Depression: - Provides group verbal and written instruction on the correlation between heart/lung disease and depressed mood, treatment options, and the stigmas associated with seeking treatment.   Anatomy & Physiology of the Heart: - Group verbal and written instruction and models provide basic cardiac anatomy and physiology, with the coronary electrical and arterial systems. Review of: AMI, Angina, Valve disease, Heart Failure, Cardiac Arrhythmia, Pacemakers, and the ICD.   Cardiac Procedures: - Group verbal and written instruction and models to describe the testing methods done to diagnose heart disease. Reviews the outcomes of the test results. Describes the treatment choices: Medical Management, Angioplasty, or Coronary Bypass Surgery.   Cardiac Medications: - Group verbal and written instruction to review commonly prescribed medications for heart disease. Reviews the medication, class of the drug, and side effects. Includes the steps to properly store meds and maintain the prescription regimen.      Cardiac Rehab from 12/26/2015 in [facility] Cardiac and Pulmonary Rehab   Date  12/26/15   Educator  SB   Instruction Review Code  2- meets goals/outcomes      Go Sex-Intimacy & Heart Disease, Get SMART - Goal Setting: - Group verbal and written  instruction through game format to discuss heart disease and the return to sexual intimacy. Provides group verbal and written material to discuss and apply goal setting through the application of the S.M.A.R.T. Method.   Other Matters of the Heart: - Provides group verbal, written materials and models to describe Heart Failure, Angina, Valve Disease, and Diabetes in the realm of heart disease. Includes description of the disease process and treatment options available to the cardiac patient.   Exercise & Equipment Safety: - Individual verbal instruction and demonstration  of equipment use and safety with use of the equipment.      Cardiac Rehab from 12/26/2015 in [facility] Cardiac and Pulmonary Rehab   Date  08/06/15   Educator  RM   Instruction Review Code  2- meets goals/outcomes      Infection Prevention: - Provides verbal and written material to individual with discussion of infection control including proper hand washing and proper equipment cleaning during exercise session.      Cardiac Rehab from 12/26/2015 in [facility] Cardiac and Pulmonary Rehab   Date  08/06/15   Educator  RM   Instruction Review Code  2- meets goals/outcomes      Falls Prevention: - Provides verbal and written material to individual with discussion of falls prevention and safety.   Diabetes: - Individual verbal and written instruction to review signs/symptoms of diabetes, desired ranges of glucose level fasting, after meals and with exercise. Advice that pre and post exercise glucose checks will be done for 3 sessions at entry of program.      Cardiac Rehab from 12/26/2015 in [facility] Cardiac and Pulmonary Rehab   Date  07/31/15   Educator  SB   Instruction Review Code  2- meets goals/outcomes       Knowledge Questionnaire Score:     Knowledge Questionnaire Score - 08/01/15 1209    Knowledge Questionnaire Score   Pre Score 22/28      Personal Goals and Risk Factors at Admission:     Personal Goals and Risk  Factors at Admission - 07/31/15 1103    Personal Goals and Risk Factors on Admission   Increase Aerobic Exercise and Physical Activity Yes   Intervention While in program, learn and follow the exercise prescription taught. Start at a low level workload and increase workload after able to maintain previous level for 30 minutes. Increase time before increasing intensity.   Understand more about Heart/Pulmonary Disease. Yes   Intervention While in program utilize professionals for any questions, and attend the education sessions. Great websites to use are www.americanheart.org or www.lung.org for reliable information.   Diabetes Yes   Goal Blood glucose control identified by blood glucose values, HgbA1C. Participant verbalizes understanding of the signs/symptoms of hyper/hypo glycemia, proper foot care and importance of medication and nutrition plan for blood glucose control.   Intervention Provide nutrition & aerobic exercise along with prescribed medications to achieve blood glucose in normal ranges: Fasting 65-99 mg/dL   Hypertension Yes   Goal Participant will see blood pressure controlled within the values of 140/58m/Hg or within value directed by their physician.   Intervention Provide nutrition & aerobic exercise along with prescribed medications to achieve BP 140/90 or less.   Lipids Yes   Goal Cholesterol controlled with medications as prescribed, with individualized exercise RX and with personalized nutrition plan. Value goals: LDL < 757m HDL > 4062mParticipant states understanding of desired cholesterol values and following prescriptions.   Intervention Provide nutrition & aerobic exercise along with prescribed medications to achieve LDL <47m77mDL >40mg48m   Personal Goals and Risk Factors Review:      Goals and Risk Factor Review      08/23/15 1537 08/29/15 1157 10/09/15 1340 12/04/15 0737 12/07/15 0931   Increase Aerobic Exercise and Physical Activity   Goals  Progress/Improvement seen  No No No Yes    Comments DonalMalikahbeen out of Cardiac Rehab since 08/06/2015 since he told DianeRoanna Epley "exercising is antogonizing his tumor/cancer".  Donal[place]  and said he wants to return to Cardiac REhab in about a month. He is currently getting a cancer treatment today. [place] has nutritionist. Giovanny was also reported he was in the hospital for Blood clots. [name] is progressing well ,especially considering he was out for several months. The cancer and treatments were affecting his exercise and he is feeling much better now. Until he has more consistent attendance, we will hold off on interval training and adding exercise at home. We will follow up with him in February on how he is feeling with the exercise and add new goals at that time.     Diabetes   Goal --  Markee has been out of Cardiac Rehab since 08/06/2015 since he told Roanna Epley that "exercising is antogonizing his tumor/cancer".  --  See [place] lab work. Has been out since Sept from Cardiac Rehab.      Progress seen towards goals     No   Comments     No change   Hypertension   Goal --  Paxson has been out of Cardiac Rehab since 08/06/2015 since he told Roanna Epley that "exercising is antogonizing his tumor/cancer".  --  Tereso called [place] and said he wants to return to Cardiac REhab in about a month. He is currently getting a cancer treatment today. [place] has nutritionist. Dagen was also reported he was in the hospital for Blood clot     Progress seen toward goals     No   Comments     No change, BP medication is doing its job  and BP readings in class are in appropriate range   Abnormal Lipids   Goal   --  Brentt called [place] and said he wants to return to Cardiac REhab in about a month. He is currently getting a cancer treatment today. [place] has nutritionist. Ivaan was also reported he was in the hospital for Blood clot     Progress seen towards goals      No   Comments     Lipids have not been re-evaluated     12/31/15 1035           Increase Aerobic Exercise and Physical Activity   Goals Progress/Improvement seen  Yes       Comments [name] continues to progress despite ongoing health issues, his attendance has been more consistent which has helped with better exercise progression and him feeling more confident with exercise workloads. [name] is going to try to do some activity at home on days he is not in class. He will start with one day a week of doing light activity at home. We will follow up with him in the coming weeks on how that home exercise is going.           Personal Goals Discharge (Final Personal Goals and Risk Factors Review):      Goals and Risk Factor Review - 12/31/15 1035    Increase Aerobic Exercise and Physical Activity   Goals Progress/Improvement seen  Yes   Comments [name] continues to progress despite ongoing health issues, his attendance has been more consistent which has helped with better exercise progression and him feeling more confident with exercise workloads. [name] is going to try to do some activity at home on days he is not in class. He will start with one day a week of doing light activity at home. We will follow up with him in the coming weeks  on how that home exercise is going.       ITP Comments:     ITP Comments      10/22/15 1216 11/18/15 1225 11/23/15 0946 12/13/15 1134 01/08/16 0804   ITP Comments 30 day review preparation: Continue with ITP Ready for 30 day review.  Continue with ITP  Has only attended 4 sessions. Last on 08/06/2015. Has been out with medical concerns. Plans to return Jan 4.  [name] did well after first day back on Wed. He was tired today, so was advised to slow down and take rest breaks as needed.  Ready for 30 day review. Continue with ITP. 30 day review.  Continue with ITP.  [name] has been unable to attend some sessions secondary to the side effects of the chemotherapy he is  currently  receiving. He attends sessions as often as he can.      Comments:

## 2016-01-11 NOTE — Progress Notes (Signed)
[place]  [phone] [phone]  ID: Louis Alexander OB: Feb 05, 1941  MR#: [id number]  [id number]  Patient Care Team: Birdie Sons, MD as PCP - General (Family Medicine) Murrell Redden, MD (Urology) Dionisio David, MD as Consulting Physician (Cardiology) Lloyd Huger, MD as Consulting Physician (Oncology)  CHIEF COMPLAINT:  Chief Complaint  Patient presents with  . urothelial carcinoma    INTERVAL HISTORY: Patient returns to clinic for further evaluation and consideration of cycle 5, day 8 of cisplatin and gemcitabine. His pain is today approximately 8 out of 10. He states the fentanyl patch helps but then rose off after 48 hours. He also has worsening nausea. He is highly anxious.  He does not complain of chest pain or shortness of breath. He has no neurologic complaints. Patient denies any fevers. He denies any nausea, vomiting, constipation, or diarrhea. Patient offers no further specific complaints today.   REVIEW OF SYSTEMS:   Review of Systems  Constitutional: Positive for malaise/fatigue. Negative for fever.  Respiratory: Negative.  Negative for shortness of breath.   Cardiovascular: Negative.  Negative for chest pain.  Gastrointestinal: Positive for nausea and abdominal pain.  Genitourinary: Positive for flank pain.  Musculoskeletal: Negative.   Neurological: Negative.   Psychiatric/Behavioral: The patient is nervous/anxious.     As per HPI. Otherwise, a complete review of systems is negatve.  PAST MEDICAL HISTORY: Past Medical History  Diagnosis Date  . Abnormal EKG   . Coronary artery disease   . Diabetes mellitus without complication ([place])   . Obesity   . Hyperlipidemia   . Hypertension   . Sleep apnea     could not tolerate cpap mask  . Arthritis     oa  . Cancer [facility])     bladder and prostate  . History of radiation therapy 2012  . History of chemotherapy 2014  . Bladder cancer ([place])   . History  of chicken pox   . Myocardial infarction ([place]) 2008 and 2012    x 2    PAST SURGICAL HISTORY: Past Surgical History  Procedure Laterality Date  . Cardiac catheterization  12-06-2003  . Lad stent  2000 x1 stent, 2008 x 1 stent, 2012 x 1 stent  . Hernia repair  yrs ago  . Appendectomy  many yrs ago  . Rotator cuff repair Left yrs ago  . Seed implant to prostate with radiation  2012  . Protatectomy and urostomy  2014  . Total hip arthroplasty Right 12/27/2013    Procedure: RIGHT TOTAL HIP ARTHROPLASTY ANTERIOR APPROACH;  Surgeon: Mauri Pole, MD;  Location: [facility];  Service: Orthopedics;  Laterality: Right;  . Cardiac catheterization N/A 06/04/2015    Procedure: Left Heart Cath and Coronary Angiography;  Surgeon: Corey Skains, MD;  Location: [place] CV LAB;  Service: Cardiovascular;  Laterality: N/A;  . Cardiac catheterization N/A 06/04/2015    Procedure: Coronary Stent Intervention;  Surgeon: Wellington Hampshire, MD;  Location: [place] CV LAB;  Service: Cardiovascular;  Laterality: N/A;  . Peripheral vascular catheterization N/A 09/25/2015    Procedure: IVC Filter Insertion;  Surgeon: Katha Cabal, MD;  Location: [place] CV LAB;  Service: Cardiovascular;  Laterality: N/A;    FAMILY HISTORY: Reviewed and unchanged. No report of malignancy or chronic disease.     ADVANCED DIRECTIVES:    HEALTH MAINTENANCE: Social History  Substance Use Topics  . Smoking status: Never Smoker   . Smokeless tobacco: Never  Used  . Alcohol Use: 0.0 oz/week    0 Standard drinks or equivalent per week     Comment: occasional     Colonoscopy:  PAP:  Bone density:  Lipid panel:  Allergies  Allergen Reactions  . Celebrex [Celecoxib]     Hematuria Other reaction(s): Bleeding Bleeding in bladder  . Effient [Prasugrel] Other (See Comments)    blood clots, blood in urine Other reaction(s): Bleeding Bleeding in bladder    Current Outpatient Prescriptions  Medication Sig  Dispense Refill  . apixaban (ELIQUIS) 5 MG TABS tablet Take 5 mg by mouth 2 (two) times daily.    [name] aspirin EC 81 MG tablet Take 81 mg by mouth daily.    [name] atenolol (TENORMIN) 25 MG tablet Take 25 mg by mouth daily.    . canagliflozin (INVOKANA) 300 MG TABS tablet Take 300 mg by mouth daily before breakfast. 30 tablet 3  . chlorproMAZINE (THORAZINE) 10 MG tablet Take 1 tablet (10 mg total) by mouth 3 (three) times daily as needed for hiccoughs. 60 tablet 3  . clopidogrel (PLAVIX) 75 MG tablet Take 1 tablet (75 mg total) by mouth daily. 30 tablet 6  . Coenzyme Q10 10 MG capsule Take 10 mg by mouth.    . Fluticasone Furoate-Vilanterol (BREO ELLIPTA) 100-25 MCG/INH AEPB Inhale 1 Inhaler into the lungs daily.    . furosemide (LASIX) 20 MG tablet Take 20 mg by mouth 2 (two) times daily.    [name] glipiZIDE (GLUCOTROL) 10 MG tablet TAKE 1 TABLET(S) BY MOUTH DAILY 30 tablet 6  . metFORMIN (GLUCOPHAGE-XR) 500 MG 24 hr tablet 2 (TWO) TABLET, ORAL, TWO TIMES DAILY 120 tablet 11  . Multiple Vitamin (MULTIVITAMIN WITH MINERALS) TABS tablet Take 1 tablet by mouth daily.    . pantoprazole (PROTONIX) 40 MG tablet Take 40 mg by mouth 2 (two) times daily.    . potassium chloride (K-DUR) 10 MEQ tablet Take 10 mEq by mouth daily.    . prasugrel (EFFIENT) 10 MG TABS tablet Take 10 mg by mouth daily. Reported on 12/03/2015    . prochlorperazine (COMPAZINE) 10 MG tablet TAKE 1 TABLET BY MOUTH EVERY 6 HOURS AS NEEDED 40 tablet 0  . sotalol (BETAPACE) 80 MG tablet Take by mouth 2 (two) times daily.    . fentaNYL (DURAGESIC - DOSED MCG/HR) 100 MCG/HR Place 1 patch (100 mcg total) onto the skin every 3 (three) days. 10 patch 0  . ondansetron (ZOFRAN) 8 MG tablet Take 1 tablet (8 mg total) by mouth every 8 (eight) hours as needed for nausea or vomiting. 60 tablet 2   Current Facility-Administered Medications  Medication Dose Route Frequency Provider Last Rate Last Dose  . morphine 4 MG/ML injection 4 mg  4 mg Intravenous Q2H  PRN Lloyd Huger, MD   4 mg at 01/01/16 1320   Facility-Administered Medications Ordered in Other Visits  Medication Dose Route Frequency Provider Last Rate Last Dose  . chlorhexidine (HIBICLENS) 4 % liquid 4 application  60 mL Topical Once Danae Orleans, PA-C       And  . chlorhexidine (HIBICLENS) 4 % liquid 4 application  60 mL Topical Once Danae Orleans, PA-C      . sodium chloride 0.9 % injection 10 mL  10 mL Intracatheter PRN Lloyd Huger, MD   10 mL at 05/09/15 0900  . sodium chloride 0.9 % injection 3 mL  3 mL Intravenous PRN Lloyd Huger, MD      . sodium  chloride flush (NS) 0.9 % injection 10 mL  10 mL Intravenous PRN Lloyd Huger, MD   10 mL at 12/25/15 0915    OBJECTIVE: Filed Vitals:   01/01/16 1014  BP: 148/88  Pulse: 65  Temp: 98.2 F (36.8 C)  Resp: 18     Body mass index is 26.32 kg/(m^2).    ECOG FS:1 - Symptomatic but completely ambulatory  General: Well-developed, well-nourished, no acute distress. Eyes: anicteric sclera. Lungs: Clear to auscultation bilaterally. Heart: Regular rate and rhythm. No rubs, murmurs, or gallops. Abdomen: Soft, nontender, nondistended. No organomegaly noted, normoactive bowel sounds. Musculoskeletal: No edema, cyanosis, or clubbing. Neuro: Alert, answering all questions appropriately. Cranial nerves grossly intact. Skin: No rashes or petechiae noted. Psych: Normal affect.    LAB RESULTS:  Lab Results  Component Value Date   NA 131* 01/01/2016   K 4.8 01/01/2016   CL 99* 01/01/2016   CO2 25 01/01/2016   GLUCOSE 215* 01/01/2016   BUN 27* 01/01/2016   CREATININE 1.42* 01/01/2016   CALCIUM 9.4 01/01/2016   PROT 7.3 01/01/2016   ALBUMIN 3.8 01/01/2016   AST 16 01/01/2016   ALT 15* 01/01/2016   ALKPHOS 87 01/01/2016   BILITOT 0.5 01/01/2016   GFRNONAA 47* 01/01/2016   GFRAA 55* 01/01/2016    Lab Results  Component Value Date   WBC 4.7 01/01/2016   NEUTROABS 2.8 01/01/2016   HGB 11.4*  01/01/2016   HCT 33.8* 01/01/2016   MCV 88.8 01/01/2016   PLT 150 01/01/2016     STUDIES: Nm Pet Image Restag (ps) Skull Base To Thigh  12/20/2015  CLINICAL DATA:  Subsequent treatment strategy for history of uroepithelial cancer. Pain. EXAM: NUCLEAR MEDICINE PET SKULL BASE TO THIGH TECHNIQUE: 12.1 mCi F-18 FDG was injected intravenously. Full-ring PET imaging was performed from the skull base to thigh after the radiotracer. CT data was obtained and used for attenuation correction and anatomic localization. FASTING BLOOD GLUCOSE:  Value: 127 mg/dl COMPARISON:  11/06/2015. FINDINGS: NECK No areas of abnormal hypermetabolism. CHEST New or increased multi focal right pleural hypermetabolism corresponding to mild thickening. Example measuring 7 mm and a S.U.V. max of 4.0 on image 82/series 3. This measured a S.U.V. max of 2.1 on the prior. Left upper lobe partially cavitary nodule measures 2.5 cm and a S.U.V. max of 2.6 on image 93/ series 3. Compare 2.8 cm and a S.U.V. max of 3.7 on the prior exam. Subcarinal node measures 1.0 cm and a S.U.V. max of 13.7 on image 97/series 3. Compare 9 mm and 11.7 on the prior. Newly hypermetabolic right cardiophrenic angle node measuring 7 mm and a S.U.V. max of 3.1. ABDOMEN/PELVIS Multifocal hepatic metastasis. Index anterior right hepatic lobe lesion measures 3.5 cm and a S.U.V. max of 18.9 on image 121/series 3. Compare similar in size and a S.U.V. max of 15.9 on the prior. Index left hepatic lobe lesion measures 2.8 cm and a S.U.V. max of 18.4 today versus similar in size and an SUV of 18.8 on the prior. Retroperitoneal hypermetabolic nodes, including index node in the left periaortic space which measures 7 mm and a S.U.V. max of 3.8. Similar in size and hypermetabolism on the prior. Pelvic adenopathy, including a left inguinal node which measures 1.5 cm and a S.U.V. max of 21.2. Compare similar in size and 20.3 on the prior. Presumed primary within the penis measures a  S.U.V. max of 27.0 versus a S.U.V. max of 26 on the prior. SKELETON Osseous metastasis. Right  iliac index lesion measures a S.U.V. max of 10.8 today versus a S.U.V. max of 8.6. CT IMAGES PERFORMED FOR ATTENUATION CORRECTION No significant findings within the neck. A right Port-A-Cath which terminates at the cavoatrial junction. Mild cardiomegaly with dense coronary artery atherosclerosis. Areas of left major fissure thickening are similar, including on image 74/series 3. Fatty atrophy throughout the pancreas. Normal adrenal glands. Too small to characterize interpolar right renal lesion. No hydronephrosis. IVC filter. 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 Prostatectomy. IMPRESSION: 1. Mild disease progression, as evidenced by new or increased right pleural metastasis, new right cardiophrenic angle hypermetabolic node. 2. The majority of sites of disease, including left upper lobe, liver, abdominal pelvic nodes, bones and presumed penile primary are similar to minimally progressive, as detailed above Electronically Signed   By: Abigail Miyamoto M.D.   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  PLAN:    1.  Urothelial carcinoma:  PET scan results from December 20, 2015 reviewed independently and reported as above with at least stable disease.  Proceed with cycle 5, day 8 of cisplatin and gemcitabine today. Return to clinic in 2 weeks for consideration of cycle 6, day 1.  If patient progresses can consider treatment with atezolizumab or nivolumab.  2.  Lung nodule: Pathology result reviewed independently with hyphae noted on sample. Appreciate infectious disease input. Patient currently taking voriconazole and will require treatment for at least 2-3 months.  3. Pain: Likely secondary to malignancy. Patient previously had XRT to his pelvic area. We will increase fentanyl patch to every 48 hours. 4. Hyperglycemia: Patient  is diabetic. Continue glipizide and metformin as prescribed. Monitor. 5. Anemia: Mild, monitor. 6. Atrial fibrillation: Patient's heart is in regular rhythm today. Continue treatment and follow up with cardiology. 7. Pulmonary embolism: Continue Eliquis as prescribed. Patient also has an IVC filter in place. 8. Nausea: Patient was given a prescription for Zofran today.  Patient expressed understanding and was in agreement with this plan. He also understands that He can call clinic at any time with any questions, concerns, or complaints.   Urothelial cancer   Staging form: Kidney, AJCC 7th Edition     Clinical stage from 03/16/2015: Stage IV (TX, N1, M1) - Signed by Lloyd Huger, MD on 03/16/2015  Lloyd Huger, MD   01/11/2016 10:03 AM

## 2016-01-15 ENCOUNTER — Inpatient Hospital Stay (HOSPITAL_BASED_OUTPATIENT_CLINIC_OR_DEPARTMENT_OTHER): Payer: PPO | Admitting: Oncology

## 2016-01-15 ENCOUNTER — Other Ambulatory Visit: Payer: Self-pay | Admitting: *Deleted

## 2016-01-15 ENCOUNTER — Encounter: Payer: Self-pay | Admitting: Oncology

## 2016-01-15 ENCOUNTER — Inpatient Hospital Stay: Payer: PPO

## 2016-01-15 VITALS — BP 132/77 | HR 62 | Temp 97.0°F | Wt 179.8 lb

## 2016-01-15 DIAGNOSIS — C689 Malignant neoplasm of urinary organ, unspecified: Secondary | ICD-10-CM

## 2016-01-15 DIAGNOSIS — G4730 Sleep apnea, unspecified: Secondary | ICD-10-CM

## 2016-01-15 DIAGNOSIS — R109 Unspecified abdominal pain: Secondary | ICD-10-CM

## 2016-01-15 DIAGNOSIS — M129 Arthropathy, unspecified: Secondary | ICD-10-CM

## 2016-01-15 DIAGNOSIS — Z7901 Long term (current) use of anticoagulants: Secondary | ICD-10-CM

## 2016-01-15 DIAGNOSIS — R918 Other nonspecific abnormal finding of lung field: Secondary | ICD-10-CM

## 2016-01-15 DIAGNOSIS — K435 Parastomal hernia without obstruction or  gangrene: Secondary | ICD-10-CM

## 2016-01-15 DIAGNOSIS — R531 Weakness: Secondary | ICD-10-CM

## 2016-01-15 DIAGNOSIS — R590 Localized enlarged lymph nodes: Secondary | ICD-10-CM

## 2016-01-15 DIAGNOSIS — Z95828 Presence of other vascular implants and grafts: Secondary | ICD-10-CM

## 2016-01-15 DIAGNOSIS — Z923 Personal history of irradiation: Secondary | ICD-10-CM

## 2016-01-15 DIAGNOSIS — I4891 Unspecified atrial fibrillation: Secondary | ICD-10-CM

## 2016-01-15 DIAGNOSIS — I2510 Atherosclerotic heart disease of native coronary artery without angina pectoris: Secondary | ICD-10-CM

## 2016-01-15 DIAGNOSIS — R5383 Other fatigue: Secondary | ICD-10-CM

## 2016-01-15 DIAGNOSIS — C787 Secondary malignant neoplasm of liver and intrahepatic bile duct: Secondary | ICD-10-CM

## 2016-01-15 DIAGNOSIS — E785 Hyperlipidemia, unspecified: Secondary | ICD-10-CM

## 2016-01-15 DIAGNOSIS — C782 Secondary malignant neoplasm of pleura: Secondary | ICD-10-CM

## 2016-01-15 DIAGNOSIS — C679 Malignant neoplasm of bladder, unspecified: Secondary | ICD-10-CM | POA: Diagnosis not present

## 2016-01-15 DIAGNOSIS — Z7982 Long term (current) use of aspirin: Secondary | ICD-10-CM

## 2016-01-15 DIAGNOSIS — F419 Anxiety disorder, unspecified: Secondary | ICD-10-CM

## 2016-01-15 DIAGNOSIS — R110 Nausea: Secondary | ICD-10-CM

## 2016-01-15 DIAGNOSIS — Z8546 Personal history of malignant neoplasm of prostate: Secondary | ICD-10-CM

## 2016-01-15 DIAGNOSIS — Z79899 Other long term (current) drug therapy: Secondary | ICD-10-CM

## 2016-01-15 DIAGNOSIS — Z9221 Personal history of antineoplastic chemotherapy: Secondary | ICD-10-CM

## 2016-01-15 DIAGNOSIS — I10 Essential (primary) hypertension: Secondary | ICD-10-CM

## 2016-01-15 DIAGNOSIS — E669 Obesity, unspecified: Secondary | ICD-10-CM

## 2016-01-15 DIAGNOSIS — C7951 Secondary malignant neoplasm of bone: Secondary | ICD-10-CM | POA: Diagnosis not present

## 2016-01-15 DIAGNOSIS — E860 Dehydration: Secondary | ICD-10-CM

## 2016-01-15 DIAGNOSIS — Z7984 Long term (current) use of oral hypoglycemic drugs: Secondary | ICD-10-CM

## 2016-01-15 DIAGNOSIS — Z5111 Encounter for antineoplastic chemotherapy: Secondary | ICD-10-CM | POA: Diagnosis not present

## 2016-01-15 DIAGNOSIS — E1165 Type 2 diabetes mellitus with hyperglycemia: Secondary | ICD-10-CM

## 2016-01-15 DIAGNOSIS — R739 Hyperglycemia, unspecified: Secondary | ICD-10-CM

## 2016-01-15 DIAGNOSIS — D649 Anemia, unspecified: Secondary | ICD-10-CM

## 2016-01-15 DIAGNOSIS — Z86711 Personal history of pulmonary embolism: Secondary | ICD-10-CM

## 2016-01-15 DIAGNOSIS — I252 Old myocardial infarction: Secondary | ICD-10-CM

## 2016-01-15 LAB — CBC WITH DIFFERENTIAL/PLATELET
BASOS PCT: 1 %
Basophils Absolute: 0 10*3/uL (ref 0–0.1)
EOS ABS: 0.1 10*3/uL (ref 0–0.7)
Eosinophils Relative: 3 %
HCT: 29.7 % — ABNORMAL LOW (ref 40.0–52.0)
Hemoglobin: 10.1 g/dL — ABNORMAL LOW (ref 13.0–18.0)
Lymphocytes Relative: 52 %
Lymphs Abs: 1.5 10*3/uL (ref 1.0–3.6)
MCH: 30.3 pg (ref 26.0–34.0)
MCHC: 34.1 g/dL (ref 32.0–36.0)
MCV: 88.9 fL (ref 80.0–100.0)
MONO ABS: 0.5 10*3/uL (ref 0.2–1.0)
MONOS PCT: 19 %
NEUTROS PCT: 25 %
Neutro Abs: 0.7 10*3/uL — ABNORMAL LOW (ref 1.4–6.5)
PLATELETS: 113 10*3/uL — AB (ref 150–440)
RBC: 3.34 MIL/uL — ABNORMAL LOW (ref 4.40–5.90)
RDW: 15.5 % — AB (ref 11.5–14.5)
WBC: 2.9 10*3/uL — ABNORMAL LOW (ref 3.8–10.6)

## 2016-01-15 LAB — COMPREHENSIVE METABOLIC PANEL
ALBUMIN: 3.9 g/dL (ref 3.5–5.0)
ALT: 12 U/L — ABNORMAL LOW (ref 17–63)
ANION GAP: 6 (ref 5–15)
AST: 18 U/L (ref 15–41)
Alkaline Phosphatase: 97 U/L (ref 38–126)
BUN: 32 mg/dL — ABNORMAL HIGH (ref 6–20)
CO2: 26 mmol/L (ref 22–32)
Calcium: 9.3 mg/dL (ref 8.9–10.3)
Chloride: 99 mmol/L — ABNORMAL LOW (ref 101–111)
Creatinine, Ser: 2.16 mg/dL — ABNORMAL HIGH (ref 0.61–1.24)
GFR calc Af Amer: 33 mL/min — ABNORMAL LOW (ref >60)
GFR calc non Af Amer: 29 mL/min — ABNORMAL LOW (ref >60)
GLUCOSE: 96 mg/dL (ref 65–99)
POTASSIUM: 4.4 mmol/L (ref 3.5–5.1)
SODIUM: 131 mmol/L — AB (ref 135–145)
Total Bilirubin: 0.5 mg/dL (ref 0.3–1.2)
Total Protein: 7.3 g/dL (ref 6.5–8.1)

## 2016-01-15 MED ORDER — HEPARIN SOD (PORK) LOCK FLUSH 100 UNIT/ML IV SOLN
500.0000 [IU] | Freq: Once | INTRAVENOUS | Status: AC
  Administered 2016-01-15: 500 [IU] via INTRAVENOUS

## 2016-01-15 MED ORDER — HEPARIN SOD (PORK) LOCK FLUSH 100 UNIT/ML IV SOLN
INTRAVENOUS | Status: AC
  Filled 2016-01-15: qty 5

## 2016-01-15 MED ORDER — SODIUM CHLORIDE 0.9 % IV SOLN
Freq: Once | INTRAVENOUS | Status: AC
  Administered 2016-01-15: 10:00:00 via INTRAVENOUS
  Filled 2016-01-15: qty 1000

## 2016-01-15 NOTE — Progress Notes (Signed)
Louis Alexander  [phone] [phone]  ID: Louis Alexander OB: Jul 13, 1941  MR#: [id number]  [id number]  Patient Care Team: Birdie Sons, MD as PCP - General (Family Medicine) Murrell Redden, MD (Urology) Dionisio David, MD as Consulting Physician (Cardiology) Lloyd Huger, MD as Consulting Physician (Oncology)  CHIEF COMPLAINT:  Chief Complaint  Patient presents with  . urothelial cancer  . Chemotherapy  . Nausea    INTERVAL HISTORY: Patient returns to clinic for further evaluation and consideration of cycle 6, day 1 of cisplatin and gemcitabine. His pain is today approximately 2 out of 10. He states the fentanyl patch helps much better now with the increased dose. He also still has nausea. He is highly anxious.  He does not complain of chest pain or shortness of breath. He has no neurologic complaints. Patient denies any fevers. He denies any nausea, vomiting, constipation, or diarrhea. Patient offers no further specific complaints today.   REVIEW OF SYSTEMS:   Review of Systems  Constitutional: Positive for malaise/fatigue. Negative for fever.  Respiratory: Negative.  Negative for shortness of breath.   Cardiovascular: Negative.  Negative for chest pain.  Gastrointestinal: Positive for nausea and abdominal pain.  Genitourinary: Negative.   Musculoskeletal: Negative.   Neurological: Negative.   Psychiatric/Behavioral: The patient is nervous/anxious.     As per HPI. Otherwise, a complete review of systems is negatve.  PAST MEDICAL HISTORY: Past Medical History  Diagnosis Date  . Abnormal EKG   . Coronary artery disease   . Diabetes mellitus without complication ([place])   . Obesity   . Hyperlipidemia   . Hypertension   . Sleep apnea     could not tolerate cpap mask  . Arthritis     oa  . Cancer [facility])     bladder and prostate  . History of radiation therapy 2012  . History of chemotherapy 2014  . Bladder cancer ([place])    . History of chicken pox   . Myocardial infarction ([place]) 2008 and 2012    x 2    PAST SURGICAL HISTORY: Past Surgical History  Procedure Laterality Date  . Cardiac catheterization  12-06-2003  . Lad stent  2000 x1 stent, 2008 x 1 stent, 2012 x 1 stent  . Hernia repair  yrs ago  . Appendectomy  many yrs ago  . Rotator cuff repair Left yrs ago  . Seed implant to prostate with radiation  2012  . Protatectomy and urostomy  2014  . Total hip arthroplasty Right 12/27/2013    Procedure: RIGHT TOTAL HIP ARTHROPLASTY ANTERIOR APPROACH;  Surgeon: Mauri Pole, MD;  Location: [facility];  Service: Orthopedics;  Laterality: Right;  . Cardiac catheterization N/A 06/04/2015    Procedure: Left Heart Cath and Coronary Angiography;  Surgeon: Corey Skains, MD;  Location: [place] [facility];  Service: Cardiovascular;  Laterality: N/A;  . Cardiac catheterization N/A 06/04/2015    Procedure: Coronary Stent Intervention;  Surgeon: Wellington Hampshire, MD;  Location: [place] [facility];  Service: Cardiovascular;  Laterality: N/A;  . Peripheral vascular catheterization N/A 09/25/2015    Procedure: IVC Filter Insertion;  Surgeon: Katha Cabal, MD;  Location: [place] [facility];  Service: Cardiovascular;  Laterality: N/A;    FAMILY HISTORY: Reviewed and unchanged. No report of malignancy or chronic disease.     ADVANCED DIRECTIVES:    HEALTH MAINTENANCE: Social History  Substance Use Topics  . Smoking status: Never Smoker   .  Smokeless tobacco: Never Used  . Alcohol Use: 0.0 oz/week    0 Standard drinks or equivalent per week     Comment: occasional     Colonoscopy:  PAP:  Bone density:  Lipid panel:  Allergies  Allergen Reactions  . Celebrex [Celecoxib]     Hematuria Other reaction(s): Bleeding Bleeding in bladder  . Effient [Prasugrel] Other (See Comments)    blood clots, blood in urine Other reaction(s): Bleeding Bleeding in bladder    Current Outpatient Prescriptions    Medication Sig Dispense Refill  . apixaban (ELIQUIS) 5 MG TABS tablet Take 5 mg by mouth 2 (two) times daily.    [name] aspirin EC 81 MG tablet Take 81 mg by mouth daily.    [name] atenolol (TENORMIN) 25 MG tablet Take 25 mg by mouth daily.    . canagliflozin (INVOKANA) 300 MG TABS tablet Take 300 mg by mouth daily before breakfast. 30 tablet 3  . chlorproMAZINE (THORAZINE) 10 MG tablet Take 1 tablet (10 mg total) by mouth 3 (three) times daily as needed for hiccoughs. 60 tablet 3  . clopidogrel (PLAVIX) 75 MG tablet Take 1 tablet (75 mg total) by mouth daily. 30 tablet 6  . Coenzyme Q10 10 MG capsule Take 10 mg by mouth.    . fentaNYL (DURAGESIC - DOSED MCG/HR) 100 MCG/HR Place 1 patch (100 mcg total) onto the skin every 3 (three) days. 10 patch 0  . Fluticasone Furoate-Vilanterol (BREO ELLIPTA) 100-25 MCG/INH AEPB Inhale 1 Inhaler into the lungs daily.    . furosemide (LASIX) 20 MG tablet Take 20 mg by mouth 2 (two) times daily.    [name] glipiZIDE (GLUCOTROL) 10 MG tablet TAKE 1 TABLET(S) BY MOUTH DAILY 30 tablet 6  . metFORMIN (GLUCOPHAGE-XR) 500 MG 24 hr tablet 2 (TWO) TABLET, ORAL, TWO TIMES DAILY 120 tablet 11  . Multiple Vitamin (MULTIVITAMIN WITH MINERALS) TABS tablet Take 1 tablet by mouth daily.    . ondansetron (ZOFRAN) 8 MG tablet Take 1 tablet (8 mg total) by mouth every 8 (eight) hours as needed for nausea or vomiting. 60 tablet 2  . pantoprazole (PROTONIX) 40 MG tablet Take 40 mg by mouth 2 (two) times daily.    . potassium chloride (K-DUR) 10 MEQ tablet Take 10 mEq by mouth daily.    . prasugrel (EFFIENT) 10 MG TABS tablet Take 10 mg by mouth daily. Reported on 12/03/2015    . prochlorperazine (COMPAZINE) 10 MG tablet TAKE 1 TABLET BY MOUTH EVERY 6 HOURS AS NEEDED 40 tablet 0  . sotalol (BETAPACE) 80 MG tablet Take by mouth 2 (two) times daily.    . sucralfate (CARAFATE) 1 g tablet Take by mouth.     No current facility-administered medications for this visit.   Facility-Administered  Medications Ordered in Other Visits  Medication Dose Route Frequency Provider Last Rate Last Dose  . chlorhexidine (HIBICLENS) 4 % liquid 4 application  60 mL Topical Once Danae Orleans, PA-C       And  . chlorhexidine (HIBICLENS) 4 % liquid 4 application  60 mL Topical Once Danae Orleans, PA-C      . sodium chloride 0.9 % injection 10 mL  10 mL Intracatheter PRN Lloyd Huger, MD   10 mL at 05/09/15 0900  . sodium chloride 0.9 % injection 3 mL  3 mL Intravenous PRN Lloyd Huger, MD      . sodium chloride flush (NS) 0.9 % injection 10 mL  10 mL Intravenous PRN Kathlene November  Grayland Ormond, MD   10 mL at 12/25/15 0915    OBJECTIVE: Filed Vitals:   01/15/16 0859  BP: 132/77  Pulse: 62  Temp: 97 F (36.1 C)     Body mass index is 25.8 kg/(m^2).    ECOG FS:1 - Symptomatic but completely ambulatory  General: Well-developed, well-nourished, no acute distress. Eyes: anicteric sclera. Lungs: Clear to auscultation bilaterally. Heart: Regular rate and rhythm. No rubs, murmurs, or gallops. Abdomen: Soft, nontender, nondistended. No organomegaly noted, normoactive bowel sounds. Musculoskeletal: No edema, cyanosis, or clubbing. Neuro: Alert, answering all questions appropriately. Cranial nerves grossly intact. Skin: No rashes or petechiae noted. Psych: Normal affect.    LAB RESULTS:  Lab Results  Component Value Date   NA 131* 01/15/2016   K 4.4 01/15/2016   CL 99* 01/15/2016   CO2 26 01/15/2016   GLUCOSE 96 01/15/2016   BUN 32* 01/15/2016   CREATININE 2.16* 01/15/2016   CALCIUM 9.3 01/15/2016   PROT 7.3 01/15/2016   ALBUMIN 3.9 01/15/2016   AST 18 01/15/2016   ALT 12* 01/15/2016   ALKPHOS 97 01/15/2016   BILITOT 0.5 01/15/2016   GFRNONAA 29* 01/15/2016   GFRAA 33* 01/15/2016    Lab Results  Component Value Date   WBC 2.9* 01/15/2016   NEUTROABS 0.7* 01/15/2016   HGB 10.1* 01/15/2016   HCT 29.7* 01/15/2016   MCV 88.9 01/15/2016   PLT 113* 01/15/2016      STUDIES: Nm Pet Image Restag (ps) Skull Base To Thigh  12/20/2015  CLINICAL DATA:  Subsequent treatment strategy for history of uroepithelial cancer. Pain. EXAM: NUCLEAR MEDICINE PET SKULL BASE TO THIGH TECHNIQUE: 12.1 mCi F-18 FDG was injected intravenously. Full-ring PET imaging was performed from the skull base to thigh after the radiotracer. CT data was obtained and used for attenuation correction and anatomic localization. FASTING BLOOD GLUCOSE:  Value: 127 mg/dl COMPARISON:  11/06/2015. FINDINGS: NECK No areas of abnormal hypermetabolism. CHEST New or increased multi focal right pleural hypermetabolism corresponding to mild thickening. Example measuring 7 mm and a S.U.V. max of 4.0 on image 82/series 3. This measured a S.U.V. max of 2.1 on the prior. Left upper lobe partially cavitary nodule measures 2.5 cm and a S.U.V. max of 2.6 on image 93/ series 3. Compare 2.8 cm and a S.U.V. max of 3.7 on the prior exam. Subcarinal node measures 1.0 cm and a S.U.V. max of 13.7 on image 97/series 3. Compare 9 mm and 11.7 on the prior. Newly hypermetabolic right cardiophrenic angle node measuring 7 mm and a S.U.V. max of 3.1. ABDOMEN/PELVIS Multifocal hepatic metastasis. Index anterior right hepatic lobe lesion measures 3.5 cm and a S.U.V. max of 18.9 on image 121/series 3. Compare similar in size and a S.U.V. max of 15.9 on the prior. Index left hepatic lobe lesion measures 2.8 cm and a S.U.V. max of 18.4 today versus similar in size and an SUV of 18.8 on the prior. Retroperitoneal hypermetabolic nodes, including index node in the left periaortic space which measures 7 mm and a S.U.V. max of 3.8. Similar in size and hypermetabolism on the prior. Pelvic adenopathy, including a left inguinal node which measures 1.5 cm and a S.U.V. max of 21.2. Compare similar in size and 20.3 on the prior. Presumed primary within the penis measures a S.U.V. max of 27.0 versus a S.U.V. max of 26 on the prior. SKELETON Osseous  metastasis. Right iliac index lesion measures a S.U.V. max of 10.8 today versus a S.U.V. max of 8.6. CT IMAGES  PERFORMED FOR ATTENUATION CORRECTION No significant findings within the neck. A right Port-A-Cath which terminates at the cavoatrial junction. Mild cardiomegaly with dense coronary artery atherosclerosis. Areas of left major fissure thickening are similar, including on image 74/series 3. Fatty atrophy throughout the pancreas. Normal adrenal glands. Too small to characterize interpolar right renal lesion. No hydronephrosis. IVC filter. 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 Prostatectomy. IMPRESSION: 1. Mild disease progression, as evidenced by new or increased right pleural metastasis, new right cardiophrenic angle hypermetabolic node. 2. The majority of sites of disease, including left upper lobe, liver, abdominal pelvic nodes, bones and presumed penile primary are similar to minimally progressive, as detailed above Electronically Signed   By: Abigail Miyamoto M.D.   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  PLAN:    1.  Urothelial carcinoma:  PET scan results from December 20, 2015 reported as above with at least stable disease. Hold today's treatment of cisplatin and gemcitabine due to [place] 700. Return to clinic in 1 week for reconsideration of cycle 6, day 1.  If patient progresses can consider treatment with atezolizumab or nivolumab.  2.  Lung nodule: Pathology result reviewed independently with hyphae noted on sample. Appreciate infectious disease input. Patient currently taking voriconazole and will require treatment for at least 2-3 months.  3. Pain: Improved. Likely secondary to malignancy. Patient previously had XRT to his pelvic area. Fentanyl patch was increased to 100 mcg Q 72 hours. 4. Hyperglycemia: Patient is diabetic. Continue glipizide and metformin as prescribed. Monitor. 5. Anemia: Mild,  monitor. 6. Atrial fibrillation: Patient's heart is in regular rhythm today. Continue treatment and follow up with cardiology. 7. Nausea: Improved. Continue Zofran as needed. 8. Creatinine: Increased today 2.16. Administer one litre fluids today.   Patient expressed understanding and was in agreement with this plan. He also understands that He can call clinic at any time with any questions, concerns, or complaints.   Urothelial cancer   Staging form: Kidney, AJCC 7th Edition     Clinical stage from 03/16/2015: Stage IV (TX, N1, M1) - Signed by Lloyd Huger, MD on 03/16/2015  Mayra Reel, NP   01/15/2016 12:37 PM  Patient was seen and evaluated independently and I agree with the assessment and plan as dictated above.  Lloyd Huger, MD 01/17/2016 8:03 PM

## 2016-01-16 ENCOUNTER — Encounter: Payer: PPO | Attending: Cardiovascular Disease

## 2016-01-16 DIAGNOSIS — I214 Non-ST elevation (NSTEMI) myocardial infarction: Secondary | ICD-10-CM | POA: Insufficient documentation

## 2016-01-16 DIAGNOSIS — Z9861 Coronary angioplasty status: Secondary | ICD-10-CM | POA: Insufficient documentation

## 2016-01-16 NOTE — Addendum Note (Signed)
Addended by: Lynford Humphrey on: 01/16/2016 07:14 AM   Modules accepted: Orders

## 2016-01-18 ENCOUNTER — Other Ambulatory Visit: Payer: Self-pay | Admitting: Oncology

## 2016-01-21 ENCOUNTER — Inpatient Hospital Stay: Payer: PPO

## 2016-01-21 ENCOUNTER — Telehealth: Payer: Self-pay | Admitting: *Deleted

## 2016-01-21 ENCOUNTER — Other Ambulatory Visit: Payer: Self-pay | Admitting: Oncology

## 2016-01-21 VITALS — BP 116/75 | HR 61 | Temp 97.3°F

## 2016-01-21 DIAGNOSIS — Z7984 Long term (current) use of oral hypoglycemic drugs: Secondary | ICD-10-CM | POA: Insufficient documentation

## 2016-01-21 DIAGNOSIS — C680 Malignant neoplasm of urethra: Secondary | ICD-10-CM | POA: Diagnosis not present

## 2016-01-21 DIAGNOSIS — E785 Hyperlipidemia, unspecified: Secondary | ICD-10-CM | POA: Diagnosis not present

## 2016-01-21 DIAGNOSIS — R110 Nausea: Secondary | ICD-10-CM | POA: Diagnosis not present

## 2016-01-21 DIAGNOSIS — M129 Arthropathy, unspecified: Secondary | ICD-10-CM | POA: Diagnosis not present

## 2016-01-21 DIAGNOSIS — Z79899 Other long term (current) drug therapy: Secondary | ICD-10-CM | POA: Diagnosis not present

## 2016-01-21 DIAGNOSIS — I2510 Atherosclerotic heart disease of native coronary artery without angina pectoris: Secondary | ICD-10-CM | POA: Diagnosis not present

## 2016-01-21 DIAGNOSIS — E669 Obesity, unspecified: Secondary | ICD-10-CM | POA: Diagnosis not present

## 2016-01-21 DIAGNOSIS — Z5111 Encounter for antineoplastic chemotherapy: Secondary | ICD-10-CM | POA: Insufficient documentation

## 2016-01-21 DIAGNOSIS — I252 Old myocardial infarction: Secondary | ICD-10-CM | POA: Diagnosis not present

## 2016-01-21 DIAGNOSIS — Z7982 Long term (current) use of aspirin: Secondary | ICD-10-CM | POA: Diagnosis not present

## 2016-01-21 DIAGNOSIS — R531 Weakness: Secondary | ICD-10-CM | POA: Diagnosis not present

## 2016-01-21 DIAGNOSIS — R112 Nausea with vomiting, unspecified: Secondary | ICD-10-CM

## 2016-01-21 DIAGNOSIS — D649 Anemia, unspecified: Secondary | ICD-10-CM | POA: Insufficient documentation

## 2016-01-21 DIAGNOSIS — Z8546 Personal history of malignant neoplasm of prostate: Secondary | ICD-10-CM | POA: Insufficient documentation

## 2016-01-21 DIAGNOSIS — Z923 Personal history of irradiation: Secondary | ICD-10-CM | POA: Insufficient documentation

## 2016-01-21 DIAGNOSIS — I10 Essential (primary) hypertension: Secondary | ICD-10-CM | POA: Diagnosis not present

## 2016-01-21 DIAGNOSIS — C689 Malignant neoplasm of urinary organ, unspecified: Secondary | ICD-10-CM

## 2016-01-21 DIAGNOSIS — R918 Other nonspecific abnormal finding of lung field: Secondary | ICD-10-CM | POA: Insufficient documentation

## 2016-01-21 DIAGNOSIS — F419 Anxiety disorder, unspecified: Secondary | ICD-10-CM | POA: Diagnosis not present

## 2016-01-21 DIAGNOSIS — E1165 Type 2 diabetes mellitus with hyperglycemia: Secondary | ICD-10-CM | POA: Diagnosis not present

## 2016-01-21 DIAGNOSIS — R42 Dizziness and giddiness: Secondary | ICD-10-CM

## 2016-01-21 DIAGNOSIS — R944 Abnormal results of kidney function studies: Secondary | ICD-10-CM | POA: Insufficient documentation

## 2016-01-21 DIAGNOSIS — C787 Secondary malignant neoplasm of liver and intrahepatic bile duct: Secondary | ICD-10-CM | POA: Diagnosis not present

## 2016-01-21 DIAGNOSIS — R630 Anorexia: Secondary | ICD-10-CM | POA: Diagnosis not present

## 2016-01-21 DIAGNOSIS — R5383 Other fatigue: Secondary | ICD-10-CM | POA: Diagnosis not present

## 2016-01-21 LAB — COMPREHENSIVE METABOLIC PANEL
ALT: 24 U/L (ref 17–63)
AST: 45 U/L — AB (ref 15–41)
Albumin: 4.1 g/dL (ref 3.5–5.0)
Alkaline Phosphatase: 115 U/L (ref 38–126)
Anion gap: 7 (ref 5–15)
BUN: 37 mg/dL — AB (ref 6–20)
CHLORIDE: 96 mmol/L — AB (ref 101–111)
CO2: 26 mmol/L (ref 22–32)
CREATININE: 2.06 mg/dL — AB (ref 0.61–1.24)
Calcium: 9.1 mg/dL (ref 8.9–10.3)
GFR calc Af Amer: 35 mL/min — ABNORMAL LOW (ref >60)
GFR calc non Af Amer: 30 mL/min — ABNORMAL LOW (ref >60)
Glucose, Bld: 143 mg/dL — ABNORMAL HIGH (ref 65–99)
Potassium: 4.7 mmol/L (ref 3.5–5.1)
SODIUM: 129 mmol/L — AB (ref 135–145)
Total Bilirubin: 0.6 mg/dL (ref 0.3–1.2)
Total Protein: 7.4 g/dL (ref 6.5–8.1)

## 2016-01-21 LAB — CBC WITH DIFFERENTIAL/PLATELET
BASOS ABS: 0 10*3/uL (ref 0–0.1)
Basophils Relative: 1 %
EOS ABS: 0.1 10*3/uL (ref 0–0.7)
EOS PCT: 3 %
HCT: 31 % — ABNORMAL LOW (ref 40.0–52.0)
HEMOGLOBIN: 10.6 g/dL — AB (ref 13.0–18.0)
LYMPHS PCT: 39 %
Lymphs Abs: 1.6 10*3/uL (ref 1.0–3.6)
MCH: 30.5 pg (ref 26.0–34.0)
MCHC: 34.2 g/dL (ref 32.0–36.0)
MCV: 89.1 fL (ref 80.0–100.0)
Monocytes Absolute: 1.3 10*3/uL — ABNORMAL HIGH (ref 0.2–1.0)
Monocytes Relative: 31 %
NEUTROS PCT: 26 %
Neutro Abs: 1.1 10*3/uL — ABNORMAL LOW (ref 1.4–6.5)
PLATELETS: 339 10*3/uL (ref 150–440)
RBC: 3.48 MIL/uL — AB (ref 4.40–5.90)
RDW: 16.2 % — ABNORMAL HIGH (ref 11.5–14.5)
WBC: 4.2 10*3/uL (ref 3.8–10.6)

## 2016-01-21 LAB — MAGNESIUM: MAGNESIUM: 2 mg/dL (ref 1.7–2.4)

## 2016-01-21 MED ORDER — SODIUM CHLORIDE 0.9 % IV SOLN
Freq: Once | INTRAVENOUS | Status: AC
  Administered 2016-01-21: 14:00:00 via INTRAVENOUS
  Filled 2016-01-21: qty 1000

## 2016-01-21 MED ORDER — HEPARIN SOD (PORK) LOCK FLUSH 100 UNIT/ML IV SOLN
INTRAVENOUS | Status: AC
  Filled 2016-01-21: qty 5

## 2016-01-21 MED ORDER — HEPARIN SOD (PORK) LOCK FLUSH 100 UNIT/ML IV SOLN
500.0000 [IU] | Freq: Once | INTRAVENOUS | Status: AC
  Administered 2016-01-21: 500 [IU] via INTRAVENOUS

## 2016-01-21 MED ORDER — SODIUM CHLORIDE 0.9 % IV SOLN
Freq: Once | INTRAVENOUS | Status: DC
  Filled 2016-01-21: qty 1000

## 2016-01-21 MED ORDER — SODIUM CHLORIDE 0.9% FLUSH
10.0000 mL | Freq: Once | INTRAVENOUS | Status: AC
  Administered 2016-01-21: 10 mL via INTRAVENOUS
  Filled 2016-01-21: qty 10

## 2016-01-21 NOTE — Telephone Encounter (Signed)
Called to report that he is vomiting and has dizziness and an unsteady gait.  Per Dr Grayland Ormond, come in for IVF and lab. Agrees to be here at 115

## 2016-01-22 ENCOUNTER — Inpatient Hospital Stay: Payer: PPO

## 2016-01-22 ENCOUNTER — Other Ambulatory Visit: Payer: Self-pay | Admitting: *Deleted

## 2016-01-22 ENCOUNTER — Inpatient Hospital Stay: Payer: PPO | Attending: Oncology | Admitting: Oncology

## 2016-01-22 VITALS — BP 149/81 | HR 61 | Temp 96.1°F | Resp 16 | Wt 178.4 lb

## 2016-01-22 DIAGNOSIS — F419 Anxiety disorder, unspecified: Secondary | ICD-10-CM

## 2016-01-22 DIAGNOSIS — Z7984 Long term (current) use of oral hypoglycemic drugs: Secondary | ICD-10-CM

## 2016-01-22 DIAGNOSIS — R630 Anorexia: Secondary | ICD-10-CM

## 2016-01-22 DIAGNOSIS — I2510 Atherosclerotic heart disease of native coronary artery without angina pectoris: Secondary | ICD-10-CM

## 2016-01-22 DIAGNOSIS — C689 Malignant neoplasm of urinary organ, unspecified: Secondary | ICD-10-CM

## 2016-01-22 DIAGNOSIS — E669 Obesity, unspecified: Secondary | ICD-10-CM

## 2016-01-22 DIAGNOSIS — R5383 Other fatigue: Secondary | ICD-10-CM

## 2016-01-22 DIAGNOSIS — I252 Old myocardial infarction: Secondary | ICD-10-CM

## 2016-01-22 DIAGNOSIS — I10 Essential (primary) hypertension: Secondary | ICD-10-CM

## 2016-01-22 DIAGNOSIS — Z7982 Long term (current) use of aspirin: Secondary | ICD-10-CM

## 2016-01-22 DIAGNOSIS — R918 Other nonspecific abnormal finding of lung field: Secondary | ICD-10-CM

## 2016-01-22 DIAGNOSIS — Z79899 Other long term (current) drug therapy: Secondary | ICD-10-CM | POA: Diagnosis not present

## 2016-01-22 DIAGNOSIS — D649 Anemia, unspecified: Secondary | ICD-10-CM

## 2016-01-22 DIAGNOSIS — Z8546 Personal history of malignant neoplasm of prostate: Secondary | ICD-10-CM

## 2016-01-22 DIAGNOSIS — E1165 Type 2 diabetes mellitus with hyperglycemia: Secondary | ICD-10-CM

## 2016-01-22 DIAGNOSIS — C7910 Secondary malignant neoplasm of unspecified urinary organs: Secondary | ICD-10-CM

## 2016-01-22 DIAGNOSIS — R531 Weakness: Secondary | ICD-10-CM

## 2016-01-22 DIAGNOSIS — Z923 Personal history of irradiation: Secondary | ICD-10-CM

## 2016-01-22 DIAGNOSIS — C787 Secondary malignant neoplasm of liver and intrahepatic bile duct: Secondary | ICD-10-CM

## 2016-01-22 DIAGNOSIS — R944 Abnormal results of kidney function studies: Secondary | ICD-10-CM

## 2016-01-22 DIAGNOSIS — E785 Hyperlipidemia, unspecified: Secondary | ICD-10-CM

## 2016-01-22 DIAGNOSIS — C680 Malignant neoplasm of urethra: Secondary | ICD-10-CM | POA: Diagnosis not present

## 2016-01-22 DIAGNOSIS — M129 Arthropathy, unspecified: Secondary | ICD-10-CM

## 2016-01-22 DIAGNOSIS — R110 Nausea: Secondary | ICD-10-CM

## 2016-01-22 DIAGNOSIS — Z5111 Encounter for antineoplastic chemotherapy: Secondary | ICD-10-CM | POA: Diagnosis not present

## 2016-01-22 LAB — COMPREHENSIVE METABOLIC PANEL
ALK PHOS: 149 U/L — AB (ref 38–126)
ALT: 45 U/L (ref 17–63)
AST: 92 U/L — AB (ref 15–41)
Albumin: 4 g/dL (ref 3.5–5.0)
Anion gap: 8 (ref 5–15)
BUN: 29 mg/dL — AB (ref 6–20)
CALCIUM: 9.4 mg/dL (ref 8.9–10.3)
CHLORIDE: 100 mmol/L — AB (ref 101–111)
CO2: 25 mmol/L (ref 22–32)
CREATININE: 1.78 mg/dL — AB (ref 0.61–1.24)
GFR, EST AFRICAN AMERICAN: 42 mL/min — AB (ref >60)
GFR, EST NON AFRICAN AMERICAN: 36 mL/min — AB (ref >60)
Glucose, Bld: 130 mg/dL — ABNORMAL HIGH (ref 65–99)
Potassium: 4.4 mmol/L (ref 3.5–5.1)
Sodium: 133 mmol/L — ABNORMAL LOW (ref 135–145)
Total Bilirubin: 0.5 mg/dL (ref 0.3–1.2)
Total Protein: 7.3 g/dL (ref 6.5–8.1)

## 2016-01-22 LAB — CBC WITH DIFFERENTIAL/PLATELET
BASOS ABS: 0 10*3/uL (ref 0–0.1)
Basophils Relative: 1 %
EOS PCT: 3 %
Eosinophils Absolute: 0.1 10*3/uL (ref 0–0.7)
HEMATOCRIT: 32 % — AB (ref 40.0–52.0)
HEMOGLOBIN: 10.9 g/dL — AB (ref 13.0–18.0)
LYMPHS ABS: 1.7 10*3/uL (ref 1.0–3.6)
LYMPHS PCT: 39 %
MCH: 30.8 pg (ref 26.0–34.0)
MCHC: 34.2 g/dL (ref 32.0–36.0)
MCV: 89.9 fL (ref 80.0–100.0)
Monocytes Absolute: 1.3 10*3/uL — ABNORMAL HIGH (ref 0.2–1.0)
Monocytes Relative: 30 %
NEUTROS ABS: 1.2 10*3/uL — AB (ref 1.4–6.5)
NEUTROS PCT: 27 %
PLATELETS: 355 10*3/uL (ref 150–440)
RBC: 3.56 MIL/uL — AB (ref 4.40–5.90)
RDW: 16.3 % — ABNORMAL HIGH (ref 11.5–14.5)
WBC: 4.3 10*3/uL (ref 3.8–10.6)

## 2016-01-22 MED ORDER — HEPARIN SOD (PORK) LOCK FLUSH 100 UNIT/ML IV SOLN
500.0000 [IU] | Freq: Once | INTRAVENOUS | Status: AC
  Administered 2016-01-22: 500 [IU] via INTRAVENOUS

## 2016-01-22 MED ORDER — SODIUM CHLORIDE 0.9 % IV SOLN
Freq: Once | INTRAVENOUS | Status: AC
  Administered 2016-01-22: 10:00:00 via INTRAVENOUS
  Filled 2016-01-22: qty 4

## 2016-01-22 MED ORDER — HEPARIN SOD (PORK) LOCK FLUSH 100 UNIT/ML IV SOLN
INTRAVENOUS | Status: AC
  Filled 2016-01-22: qty 5

## 2016-01-22 MED ORDER — SODIUM CHLORIDE 0.9% FLUSH
10.0000 mL | INTRAVENOUS | Status: DC | PRN
  Administered 2016-01-22: 10 mL via INTRAVENOUS
  Filled 2016-01-22: qty 10

## 2016-01-22 MED ORDER — SODIUM CHLORIDE 0.9 % IV SOLN
Freq: Once | INTRAVENOUS | Status: AC
  Administered 2016-01-22: 10:00:00 via INTRAVENOUS
  Filled 2016-01-22: qty 1000

## 2016-01-22 NOTE — Progress Notes (Signed)
Patient came to office yesterday for IVF due to vomiting.  Still has nausea today but has slightly improved after IVF.  Does not have an appetite.  His pain is well controlled with the pain mediations he is currently taking.

## 2016-01-25 ENCOUNTER — Other Ambulatory Visit: Payer: Self-pay | Admitting: Oncology

## 2016-01-25 DIAGNOSIS — C679 Malignant neoplasm of bladder, unspecified: Secondary | ICD-10-CM

## 2016-01-27 NOTE — Progress Notes (Signed)
Louis Alexander  [phone] [phone]) [phone]  ID: Louis Alexander OB: January 25, 1941  MR#: [id number]  [id number]  Patient Care Team: Birdie Sons, MD as PCP - General (Family Medicine) Murrell Redden, MD (Urology) Dionisio David, MD as Consulting Physician (Cardiology) Lloyd Huger, MD as Consulting Physician (Oncology)  CHIEF COMPLAINT:  Chief Complaint  Patient presents with  . urothelial cancer    INTERVAL HISTORY: Patient returns to clinic for further evaluation and consideration of cycle 7, day 1 of cisplatin and gemcitabine. His performance status is declining and his side effects are becoming more persistent. He continues to have nausea and a poor appetite and was in clinic yesterday for IV fluids. He admits to feeling slightly improved after receiving fluids. His pain is currently well-controlled. He is highly anxious.  He does not complain of chest pain or shortness of breath. He has no neurologic complaints. Patient denies any fevers. He denies any vomiting, constipation, or diarrhea. Patient offers no further specific complaints today.   REVIEW OF SYSTEMS:   Review of Systems  Constitutional: Positive for malaise/fatigue. Negative for fever.  Respiratory: Negative.  Negative for shortness of breath.   Cardiovascular: Negative.  Negative for chest pain.  Gastrointestinal: Positive for nausea and abdominal pain.  Genitourinary: Negative.   Musculoskeletal: Negative.   Neurological: Negative.   Psychiatric/Behavioral: The patient is nervous/anxious.     As per HPI. Otherwise, a complete review of systems is negatve.  PAST MEDICAL HISTORY: Past Medical History  Diagnosis Date  . Abnormal EKG   . Coronary artery disease   . Diabetes mellitus without complication ([place])   . Obesity   . Hyperlipidemia   . Hypertension   . Sleep apnea     could not tolerate cpap mask  . Arthritis     oa  . Cancer [facility])     bladder and  prostate  . History of radiation therapy 2012  . History of chemotherapy 2014  . Bladder cancer ([place])   . History of chicken pox   . Myocardial infarction ([place]) 2008 and 2012    x 2    PAST SURGICAL HISTORY: Past Surgical History  Procedure Laterality Date  . Cardiac catheterization  12-06-2003  . Lad stent  2000 x1 stent, 2008 x 1 stent, 2012 x 1 stent  . Hernia repair  yrs ago  . Appendectomy  many yrs ago  . Rotator cuff repair Left yrs ago  . Seed implant to prostate with radiation  2012  . Protatectomy and urostomy  2014  . Total hip arthroplasty Right 12/27/2013    Procedure: RIGHT TOTAL HIP ARTHROPLASTY ANTERIOR APPROACH;  Surgeon: Mauri Pole, MD;  [facility];  Service: Orthopedics;  Laterality: Right;  . Cardiac catheterization N/A 06/04/2015    Procedure: Left Heart Cath and Coronary Angiography;  Surgeon: Corey Skains, MD;  Location: [place] [facility];  Service: Cardiovascular;  Laterality: N/A;  . Cardiac catheterization N/A 06/04/2015    Procedure: Coronary Stent Intervention;  Surgeon: Wellington Hampshire, MD;  Location: [place] [facility];  Service: Cardiovascular;  Laterality: N/A;  . Peripheral vascular catheterization N/A 09/25/2015    Procedure: IVC Filter Insertion;  Surgeon: Katha Cabal, MD;  Location: [place] [facility];  Service: Cardiovascular;  Laterality: N/A;    FAMILY HISTORY: Reviewed and unchanged. No report of malignancy or chronic disease.     ADVANCED DIRECTIVES:    HEALTH MAINTENANCE: Social History  Substance  Use Topics  . Smoking status: Never Smoker   . Smokeless tobacco: Never Used  . Alcohol Use: 0.0 oz/week    0 Standard drinks or equivalent per week     Comment: occasional     Colonoscopy:  PAP:  Bone density:  Lipid panel:  Allergies  Allergen Reactions  . Celebrex [Celecoxib]     Hematuria Other reaction(s): Bleeding Bleeding in bladder  . Effient [Prasugrel] Other (See Comments)    blood clots,  blood in urine Other reaction(s): Bleeding Bleeding in bladder    Current Outpatient Prescriptions  Medication Sig Dispense Refill  . apixaban (ELIQUIS) 5 MG TABS tablet Take 5 mg by mouth 2 (two) times daily.    [name] aspirin EC 81 MG tablet Take 81 mg by mouth daily.    [name] atenolol (TENORMIN) 25 MG tablet Take 25 mg by mouth daily.    . canagliflozin (INVOKANA) 300 MG TABS tablet Take 300 mg by mouth daily before breakfast. 30 tablet 3  . clopidogrel (PLAVIX) 75 MG tablet Take 1 tablet (75 mg total) by mouth daily. 30 tablet 6  . Coenzyme Q10 10 MG capsule Take 10 mg by mouth.    . fentaNYL (DURAGESIC - DOSED MCG/HR) 100 MCG/HR Place 1 patch (100 mcg total) onto the skin every 3 (three) days. 10 patch 0  . Fluticasone Furoate-Vilanterol (BREO ELLIPTA) 100-25 MCG/INH AEPB Inhale 1 Inhaler into the lungs daily.    . furosemide (LASIX) 20 MG tablet Take 20 mg by mouth 2 (two) times daily.    [name] glipiZIDE (GLUCOTROL) 10 MG tablet TAKE 1 TABLET(S) BY MOUTH DAILY 30 tablet 6  . metFORMIN (GLUCOPHAGE-XR) 500 MG 24 hr tablet 2 (TWO) TABLET, ORAL, TWO TIMES DAILY 120 tablet 11  . Multiple Vitamin (MULTIVITAMIN WITH MINERALS) TABS tablet Take 1 tablet by mouth daily.    . ondansetron (ZOFRAN) 8 MG tablet Take 1 tablet (8 mg total) by mouth every 8 (eight) hours as needed for nausea or vomiting. 60 tablet 2  . pantoprazole (PROTONIX) 40 MG tablet Take 40 mg by mouth 2 (two) times daily.    . potassium chloride (K-DUR) 10 MEQ tablet Take 10 mEq by mouth daily.    . prasugrel (EFFIENT) 10 MG TABS tablet Take 10 mg by mouth daily. Reported on 12/03/2015    . prochlorperazine (COMPAZINE) 10 MG tablet TAKE 1 TABLET BY MOUTH EVERY 6 HOURS AS NEEDED 40 tablet 2  . sotalol (BETAPACE) 80 MG tablet Take by mouth 2 (two) times daily.    . sucralfate (CARAFATE) 1 g tablet Take by mouth.     No current facility-administered medications for this visit.   Facility-Administered Medications Ordered in Other Visits    Medication Dose Route Frequency Provider Last Rate Last Dose  . chlorhexidine (HIBICLENS) 4 % liquid 4 application  60 mL Topical Once Danae Orleans, PA-C       And  . chlorhexidine (HIBICLENS) 4 % liquid 4 application  60 mL Topical Once Danae Orleans, PA-C      . sodium chloride 0.9 % injection 10 mL  10 mL Intracatheter PRN Lloyd Huger, MD   10 mL at 05/09/15 0900  . sodium chloride 0.9 % injection 3 mL  3 mL Intravenous PRN Lloyd Huger, MD      . sodium chloride flush (NS) 0.9 % injection 10 mL  10 mL Intravenous PRN Lloyd Huger, MD   10 mL at 12/25/15 0915    OBJECTIVE: Filed Vitals:  01/22/16 0912  BP: 149/81  Pulse: 61  Temp: 96.1 F (35.6 C)  Resp: 16     Body mass index is 25.59 kg/(m^2).    ECOG FS:2 - Symptomatic, <50% confined to bed  General: Well-developed, well-nourished, no acute distress. Eyes: anicteric sclera. Lungs: Clear to auscultation bilaterally. Heart: Regular rate and rhythm. No rubs, murmurs, or gallops. Abdomen: Soft, nontender, nondistended. No organomegaly noted, normoactive bowel sounds. Musculoskeletal: No edema, cyanosis, or clubbing. Neuro: Alert, answering all questions appropriately. Cranial nerves grossly intact. Skin: No rashes or petechiae noted. Psych: Normal affect.    LAB RESULTS:  Lab Results  Component Value Date   NA 133* 01/22/2016   K 4.4 01/22/2016   CL 100* 01/22/2016   CO2 25 01/22/2016   GLUCOSE 130* 01/22/2016   BUN 29* 01/22/2016   CREATININE 1.78* 01/22/2016   CALCIUM 9.4 01/22/2016   PROT 7.3 01/22/2016   ALBUMIN 4.0 01/22/2016   AST 92* 01/22/2016   ALT 45 01/22/2016   ALKPHOS 149* 01/22/2016   BILITOT 0.5 01/22/2016   GFRNONAA 36* 01/22/2016   GFRAA 42* 01/22/2016    Lab Results  Component Value Date   WBC 4.3 01/22/2016   NEUTROABS 1.2* 01/22/2016   HGB 10.9* 01/22/2016   HCT 32.0* 01/22/2016   MCV 89.9 01/22/2016   PLT 355 01/22/2016     STUDIES: No results  found.  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  PLAN:    1.  Urothelial carcinoma:  PET scan results from December 20, 2015 reported as above with at least stable disease. Given patient's persistent symptoms and declining performance status, will discontinue cisplatin and gemcitabine and proceed with nivolumab 240 mg every 2 weeks. Return to clinic in 1 week for consideration of cycle 1. If patient progresses can consider treatment with atezolizumab. 2.  Lung nodule: Pathology result reviewed independently with hyphae noted on sample. Appreciate infectious disease input. Patient currently taking voriconazole and will require treatment for at least 2-3 months.  3. Pain: Improved. Likely secondary to malignancy. Patient previously had XRT to his pelvic area. Fentanyl patch was increased to 100 mcg Q 72 hours. 4. Hyperglycemia: Patient is diabetic. Continue glipizide and metformin as prescribed. Monitor. 5. Anemia: Mild, monitor. 6. Atrial fibrillation: Patient's heart is in regular rhythm today. Continue treatment and follow up with cardiology. 7. Nausea: Improved. Continue Zofran as needed. 8. Creatinine: Slightly improved. Proceed with IV fluids again today.  Patient expressed understanding and was in agreement with this plan. He also understands that He can call clinic at any time with any questions, concerns, or complaints.   Urothelial cancer   Staging form: Kidney, AJCC 7th Edition     Clinical stage from 03/16/2015: Stage IV ([place], N1, M1) - Signed by Lloyd Huger, MD on 03/16/2015  Lloyd Huger, MD   01/27/2016 5:08 PM

## 2016-01-28 ENCOUNTER — Encounter: Payer: Self-pay | Admitting: *Deleted

## 2016-01-28 ENCOUNTER — Telehealth: Payer: Self-pay | Admitting: *Deleted

## 2016-01-28 NOTE — Telephone Encounter (Signed)
I called Louis Alexander and he and his wife are packing up their house since their house is up for sale. He is nauseaseated today. He is suppose to get another cancer treatment tomorrow.

## 2016-01-28 NOTE — Progress Notes (Signed)
Cardiac Individual Treatment Plan  Patient Details  Name: Louis Alexander MRN: [id number] Date of Birth: 1941-05-22 Referring Provider:  No ref. provider found  Initial Encounter Date:    Visit Diagnosis: No diagnosis found.  Patient's Home Medications on Admission:  Current outpatient prescriptions:  .  apixaban (ELIQUIS) 5 MG TABS tablet, Take 5 mg by mouth 2 (two) times daily., Disp: , Rfl:  .  aspirin EC 81 MG tablet, Take 81 mg by mouth daily., Disp: , Rfl:  .  atenolol (TENORMIN) 25 MG tablet, Take 25 mg by mouth daily., Disp: , Rfl:  .  canagliflozin (INVOKANA) 300 MG TABS tablet, Take 300 mg by mouth daily before breakfast., Disp: 30 tablet, Rfl: 3 .  clopidogrel (PLAVIX) 75 MG tablet, Take 1 tablet (75 mg total) by mouth daily., Disp: 30 tablet, Rfl: 6 .  Coenzyme Q10 10 MG capsule, Take 10 mg by mouth., Disp: , Rfl:  .  fentaNYL (DURAGESIC - DOSED MCG/HR) 100 MCG/HR, Place 1 patch (100 mcg total) onto the skin every 3 (three) days., Disp: 10 patch, Rfl: 0 .  Fluticasone Furoate-Vilanterol (BREO ELLIPTA) 100-25 MCG/INH AEPB, Inhale 1 Inhaler into the lungs daily., Disp: , Rfl:  .  furosemide (LASIX) 20 MG tablet, Take 20 mg by mouth 2 (two) times daily., Disp: , Rfl:  .  glipiZIDE (GLUCOTROL) 10 MG tablet, TAKE 1 TABLET(S) BY MOUTH DAILY, Disp: 30 tablet, Rfl: 6 .  metFORMIN (GLUCOPHAGE-XR) 500 MG 24 hr tablet, 2 (TWO) TABLET, ORAL, TWO TIMES DAILY, Disp: 120 tablet, Rfl: 11 .  Multiple Vitamin (MULTIVITAMIN WITH MINERALS) TABS tablet, Take 1 tablet by mouth daily., Disp: , Rfl:  .  ondansetron (ZOFRAN) 8 MG tablet, Take 1 tablet (8 mg total) by mouth every 8 (eight) hours as needed for nausea or vomiting., Disp: 60 tablet, Rfl: 2 .  pantoprazole (PROTONIX) 40 MG tablet, Take 40 mg by mouth 2 (two) times daily., Disp: , Rfl:  .  potassium chloride (K-DUR) 10 MEQ tablet, Take 10 mEq by mouth daily., Disp: , Rfl:  .  prasugrel (EFFIENT) 10 MG TABS tablet, Take 10 mg by mouth  daily. Reported on 12/03/2015, Disp: , Rfl:  .  prochlorperazine (COMPAZINE) 10 MG tablet, TAKE 1 TABLET BY MOUTH EVERY 6 HOURS AS NEEDED, Disp: 40 tablet, Rfl: 2 .  sotalol (BETAPACE) 80 MG tablet, Take by mouth 2 (two) times daily., Disp: , Rfl:  .  sucralfate (CARAFATE) 1 g tablet, Take by mouth., Disp: , Rfl:  No current facility-administered medications for this visit.  Facility-Administered Medications Ordered in Other Visits:  .  Shower Chin To Toes With 60 mL chlorhexidine (HIBICLENS) the night before surgery, , , Once **AND** Shower Chin To Toes With 60 mL chlorhexidine (HIBICLENS) in AM of surgery after pre-op clip completed, , , Once **AND** chlorhexidine (HIBICLENS) 4 % liquid 4 application, 60 mL, Topical, Once **AND** chlorhexidine (HIBICLENS) 4 % liquid 4 application, 60 mL, Topical, Once, [employer], PA-C .  sodium chloride 0.9 % injection 10 mL, 10 mL, Intracatheter, PRN, Lloyd Huger, MD, 10 mL at 05/09/15 0900 .  sodium chloride 0.9 % injection 3 mL, 3 mL, Intravenous, PRN, Lloyd Huger, MD .  sodium chloride flush (NS) 0.9 % injection 10 mL, 10 mL, Intravenous, PRN, Lloyd Huger, MD, 10 mL at 12/25/15 0915  Past Medical History: Past Medical History  Diagnosis Date  . Abnormal EKG   . Coronary artery disease   . Diabetes mellitus without  complication ([place])   . Obesity   . Hyperlipidemia   . Hypertension   . Sleep apnea     could not tolerate cpap mask  . Arthritis     oa  . Cancer [facility])     bladder and prostate  . History of radiation therapy 2012  . History of chemotherapy 2014  . Bladder cancer ([place])   . History of chicken pox   . Myocardial infarction ([place]) 2008 and 2012    x 2    Tobacco Use: History  Smoking status  . Never Smoker   Smokeless tobacco  . Never Used    Labs: Recent Review Flowsheet Data    Labs for ITP Cardiac and Pulmonary Rehab Latest Ref Rng 10/26/2012 12/22/2014 06/12/2015 09/12/2015   Cholestrol 0 - 200 mg/dL  134 189 - -   LDLCALC - 41 115 - -   HDL 35 - 70 mg/dL 36(L) 41 - -   Trlycerides 40 - 160 mg/dL 284(H) 165(A) - -   Hemoglobin A1c - - 7.2(A) 7.7 9.2       Exercise Target Goals:    Exercise Program Goal: Individual exercise prescription set with THRR, safety & activity barriers. Participant demonstrates ability to understand and report RPE using BORG scale, to self-measure pulse accurately, and to acknowledge the importance of the exercise prescription.  Exercise Prescription Goal: Starting with aerobic activity 30 plus minutes a day, 3 days per week for initial exercise prescription. Provide home exercise prescription and guidelines that participant acknowledges understanding prior to discharge.  Activity Barriers & Risk Stratification:   6 Minute Walk:   Initial Exercise Prescription:   Exercise Prescription Changes:     Exercise Prescription Changes      08/21/15 0700 08/22/15 0700 09/18/15 0700 10/15/15 1400 11/13/15 0700   Exercise Review   Progression --  Only two visits recorded No  Only 2 visits recorded and will be out for several weeks No  Absent since last review; last visit 08/06/15 No  Absent since last review; last visit 08/06/15 No  Absent since last review; last visit 08/06/15   Response to Exercise   Blood Pressure (Admit)  140/80 mmHg      Blood Pressure (Exercise)  168/82 mmHg      Blood Pressure (Exit)  134/72 mmHg      Heart Rate (Admit)  71 bpm      Heart Rate (Exercise)  95 bpm      Heart Rate (Exit)  75 bpm      Rating of Perceived Exertion (Exercise)  12      Symptoms  Tired      Comments  Louis Alexander is going to be out for several weeks due to the cancer that he also has. His workloads may need to be reevaluated upon his return.   Exercise workloads will need to be reviewed upon his return based on his exercise capacity.    Duration  Progress to 30 minutes of continuous aerobic without signs/symptoms of physical distress Progress to 30 minutes of  continuous aerobic without signs/symptoms of physical distress Progress to 30 minutes of continuous aerobic without signs/symptoms of physical distress Progress to 30 minutes of continuous aerobic without signs/symptoms of physical distress   Intensity  Rest + 30 Rest + 30 Rest + 30 Rest + 30   Progression   Progression  Continue progressive overload as per policy without signs/symptoms or physical distress. Continue progressive overload as per policy without signs/symptoms or physical distress.  Continue progressive overload as per policy without signs/symptoms or physical distress. Continue progressive overload as per policy without signs/symptoms or physical distress.   Resistance Training   Training Prescription (read-only)  Yes Yes Yes Yes   Weight (read-only)  _0 Reps (read-only)  10-15 10-15 10-15 10-15   Interval Training   Interval Training  No No No No   Treadmill   MPH (read-only)  1.5 1.5 1.5 1.5   Grade (read-only)  0 0 0 0   Minutes (read-only)  _1 REL-XR   Level (read-only)  _2 Watts (read-only)  40 40 40 40   Minutes (read-only)  _3 11/30/15 0733 12/12/15 0800 12/26/15 0800 12/28/15 1027 01/28/16 1100   Exercise Review   Progression Yes   Yes No  Absent since last review; last visit 12/28/15   Response to Exercise   Blood Pressure (Admit) 138/82 mmHg   150/78 mmHg --   Blood Pressure (Exercise) 130/74 mmHg   148/80 mmHg --   Blood Pressure (Exit) 114/66 mmHg   118/70 mmHg --   Heart Rate (Admit) 74 bpm   88 bpm --   Heart Rate (Exercise) 88 bpm   86 bpm --   Heart Rate (Exit) 72 bpm   70 bpm --   Rating of Perceived Exertion (Exercise) 13   13 --   Symptoms _4    Comments Louis Alexander is actually in good fitness for being out for so long and feels a lot better. He made some increases this month and is encouraged by how well he feels. He is very compliant to exercise suggestions and willing to progress.  Reviewed  individualized exercise prescription and made increases per departmental policy. Exercise increases were discussed with the patient and they were able to perform the new work loads without issue (no signs or symptoms).  Louis Alexander continues to progress despite ongoing health issues. Louis Alexander is going to try to do some activity at home on days he is not in class. He will start with one day a week of doing light activity at home. Ex. rx. will be re-evaluated upon return due to extended absence   Frequency    Add 1 additional day to program exercise sessions. --   Duration Progress to 30 minutes of continuous aerobic without signs/symptoms of physical distress Progress to 30 minutes of continuous aerobic without signs/symptoms of physical distress Progress to 50 minutes of aerobic without signs/symptoms of physical distress Progress to 50 minutes of aerobic without signs/symptoms of physical distress Progress to 50 minutes of aerobic without signs/symptoms of physical distress   Intensity Rest + 30 Rest + 30 Rest + 30 Rest + 30 Rest + 30   Progression   Progression Continue progressive overload as per policy without signs/symptoms or physical distress. Continue progressive overload as per policy without signs/symptoms or physical distress. Continue progressive overload as per policy without signs/symptoms or physical distress. Continue progressive overload as per policy without signs/symptoms or physical distress. Continue progressive overload as per policy without signs/symptoms or physical distress.   Resistance Training   Training Prescription (read-only) Yes Yes Yes Yes    Weight (read-only) _5 Reps (read-only) 10-15 10-15 10-15 10-15    Interval Training   Interval Training _6    Treadmill   MPH (read-only) 1.7 1.7 1.7 1.7  Grade (read-only) 0 0 0 0    Minutes (read-only) _0 REL-XR   Level (read-only) _1 Watts (read-only) 60 60 60 60    Minutes (read-only) _2 Biostep-RELP   Level (read-only) _3 Watts (read-only) 40 45 74 40    Minutes (read-only) _4 Discharge Exercise Prescription (Final Exercise Prescription Changes):     Exercise Prescription Changes - 01/28/16 1100    Exercise Review   Progression No  Absent since last review; last visit 12/28/15   Response to Exercise   Blood Pressure (Admit) --   Blood Pressure (Exercise) --   Blood Pressure (Exit) --   Heart Rate (Admit) --   Heart Rate (Exercise) --   Heart Rate (Exit) --   Rating of Perceived Exertion (Exercise) --   Symptoms None   Comments Ex. rx. will be re-evaluated upon return due to extended absence   Frequency --   Duration Progress to 50 minutes of aerobic without signs/symptoms of physical distress   Intensity Rest + 30   Progression   Progression Continue progressive overload as per policy without signs/symptoms or physical distress.   Interval Training   Interval Training No      Nutrition:  Target Goals: Understanding of nutrition guidelines, daily intake of sodium <1535m, cholesterol <2056m calories 30% from fat and 7% or less from saturated fats, daily to have 5 or more servings of fruits and vegetables.  Biometrics:    Nutrition Therapy Plan and Nutrition Goals:     Nutrition Therapy & Goals - 12/07/15 0931    Personal Nutrition Goals   Personal Goal #1 DoTimmothy Sourss still doing well with his heart healthy eating and has no concerns or questions      Nutrition Discharge: Rate Your Plate Scores:   Nutrition Goals Re-Evaluation:     Nutrition Goals Re-Evaluation      08/23/15 1536 10/09/15 1333         Personal Goal #1 Re-Evaluation   Personal Goal #1 DoDarrioas been out of Cardiac Rehab since 08/06/2015 since he told DiRoanna Epleyhat "exercising is antogonizing his tumor/cancer".  DoSigmundalled us[place]ore[place] said he wants to return to Cardiac REhab in about a month. He is currently getting a cancer treatment today. [place] nutritionist.         Psychosocial: Target Goals: Acknowledge presence or absence of depression, maximize coping skills, provide positive support system. Participant is able to verbalize types and ability to use techniques and skills needed for reducing stress and depression.  Initial Review & Psychosocial Screening:   Quality of Life Scores:   PHQ-9:     Recent Review Flowsheet Data    Depression screen [employer]9 07/31/2015 05/03/2015   Decreased Interest 0 0   Down, Depressed, Hopeless 0 0   PHQ - 2 Score 0 0   Altered sleeping 3 -   Tired, decreased energy 1 -   Change in appetite 0 -   Feeling bad or failure about yourself  0 -   Trouble concentrating 0 -   Moving slowly or fidgety/restless 1 -   Suicidal thoughts 0 -   PHQ-9 Score 5 -   Difficult doing work/chores Not difficult at all -      Psychosocial Evaluation and Intervention:  Psychosocial Evaluation - 12/12/15 1037    Psychosocial Evaluation & Interventions   Interventions Therapist referral;Encouraged to exercise with the program and follow exercise prescription;Stress management education   Comments Counselor met with Mr. Bink for initial psychosocical evaluation.  Mr. Mamie Nick has been attending this program for awhile, but is on a different schedule than counselor, so hard to connect.  Mr. Mamie Nick is a 75 year old who had his 4th stent inserted several months ago.  He has a strong support system with a spouse of 62 years, several adult children close by and active involvement in a local church community.  Mr. Mamie Nick has other health issues that are impacting his recovery as he is currently on Chemotherapy due to several cancer spots detected in his body.  Mr. Mamie Nick states he sleeps okay (a little better lately) and his appetite is "not good" due to the Chemo.  He denies a history of depression or anxiety, but does admit some current symptoms with sadness and tearfulness more lately.  Mr. Mamie Nick reported his mood is  a "3" on a scale of 1-10, with "10" being best.  Mr. Mamie Nick states his mood was better several weeks ago, until he learned about additional "spots" of cancer he was not aware of at the time.  He admits his health, trying to get the house sold, and finances are the biggest stressors for him currently.  He had goals for increased energy for this program and has already experienced that, with Mr. Mamie Nick reporting being able to walk faster and further lately.  He plans to join a gym to continue working out consistently upon completion of this program.  Counselor recommended Mr. Mamie Nick see a local therapist to address his mood and provide supportive services during this time with so many stressors and health issues impacting his functioning.  Contact information for the therapist was provided.     Continued Psychosocial Services Needed --  Counselor will follow up with recommendation for Mr. Mamie Nick to see a counselor, as well as participation in stress management  psychoeducational component of this program.        Psychosocial Re-Evaluation:     Psychosocial Re-Evaluation      08/23/15 1537 08/29/15 1157 10/09/15 1342 01/28/16 1300     Psychosocial Re-Evaluation   Interventions Encouraged to attend Cardiac Rehabilitation for the exercise Stress management education      Comments Many stressors including -Trayden has been out of Cardiac Rehab since 08/06/2015 since he told Roanna Epley that "exercising is antogonizing his tumor/cancer".  Dandrea called [place] and said he wants to return to Cardiac REhab in about a month. He is currently getting a cancer treatment today. [place] has a counselor for their patients also. Waldo was also reported he was in the hospital for Blood clot I called Elenore Rota and he and his wife are packing up their house since their house is up for sale. He is nauseaseated today. He is suppose to get another cancer treatment tomorrow. "It is too much for him to do Cardiac REhab now"        Vocational Rehabilitation: Provide vocational rehab assistance to qualifying candidates.   Vocational Rehab Evaluation & Intervention:   Education: Education Goals: Education classes will be provided on a weekly basis, covering required topics. Participant will state understanding/return demonstration of topics presented.  Learning Barriers/Preferences:   Education Topics: General Nutrition Guidelines/Fats and Fiber: -Group instruction provided by verbal, written material, models and posters to present the general  guidelines for heart healthy nutrition. Gives an explanation and review of dietary fats and fiber.   Controlling Sodium/Reading Food Labels: -Group verbal and written material supporting the discussion of sodium use in heart healthy nutrition. Review and explanation with models, verbal and written materials for utilization of the food label.   Exercise Physiology & Risk Factors: - Group verbal and written instruction with models to review the exercise physiology of the cardiovascular system and associated critical values. Details cardiovascular disease risk factors and the goals associated with each risk factor.          Cardiac Rehab from 12/26/2015 in [facility] Cardiac and Pulmonary Rehab   Date  11/21/15   Educator  RM   Instruction Review Code  2- meets goals/outcomes      Aerobic Exercise & Resistance Training: - Gives group verbal and written discussion on the health impact of inactivity. On the components of aerobic and resistive training programs and the benefits of this training and how to safely progress through these programs.   Flexibility, Balance, General Exercise Guidelines: - Provides group verbal and written instruction on the benefits of flexibility and balance training programs. Provides general exercise guidelines with specific guidelines to those with heart or lung disease. Demonstration and skill practice provided.      Cardiac Rehab from 12/26/2015  in [facility] Cardiac and Pulmonary Rehab   Date  08/06/15   Educator  [facility]   Instruction Review Code  2- meets goals/outcomes      Stress Management: - Provides group verbal and written instruction about the health risks of elevated stress, cause of high stress, and healthy ways to reduce stress.   Depression: - Provides group verbal and written instruction on the correlation between heart/lung disease and depressed mood, treatment options, and the stigmas associated with seeking treatment.   Anatomy & Physiology of the Heart: - Group verbal and written instruction and models provide basic cardiac anatomy and physiology, with the coronary electrical and arterial systems. Review of: AMI, Angina, Valve disease, Heart Failure, Cardiac Arrhythmia, Pacemakers, and the ICD.   Cardiac Procedures: - Group verbal and written instruction and models to describe the testing methods done to diagnose heart disease. Reviews the outcomes of the test results. Describes the treatment choices: Medical Management, Angioplasty, or Coronary Bypass Surgery.   Cardiac Medications: - Group verbal and written instruction to review commonly prescribed medications for heart disease. Reviews the medication, class of the drug, and side effects. Includes the steps to properly store meds and maintain the prescription regimen.      Cardiac Rehab from 12/26/2015 in [facility] Cardiac and Pulmonary Rehab   Date  12/26/15   Educator  SB   Instruction Review Code  2- meets goals/outcomes      Go Sex-Intimacy & Heart Disease, Get SMART - Goal Setting: - Group verbal and written instruction through game format to discuss heart disease and the return to sexual intimacy. Provides group verbal and written material to discuss and apply goal setting through the application of the S.M.A.R.T. Method.   Other Matters of the Heart: - Provides group verbal, written materials and models to describe Heart Failure, Angina, Valve Disease, and  Diabetes in the realm of heart disease. Includes description of the disease process and treatment options available to the cardiac patient.   Exercise & Equipment Safety: - Individual verbal instruction and demonstration of equipment use and safety with use of the equipment.      Cardiac Rehab from 12/26/2015 in [facility]  Cardiac and Pulmonary Rehab   Date  08/06/15   Educator  RM   Instruction Review Code  2- meets goals/outcomes      Infection Prevention: - Provides verbal and written material to individual with discussion of infection control including proper hand washing and proper equipment cleaning during exercise session.      Cardiac Rehab from 12/26/2015 in [facility] Cardiac and Pulmonary Rehab   Date  08/06/15   Educator  RM   Instruction Review Code  2- meets goals/outcomes      Falls Prevention: - Provides verbal and written material to individual with discussion of falls prevention and safety.   Diabetes: - Individual verbal and written instruction to review signs/symptoms of diabetes, desired ranges of glucose level fasting, after meals and with exercise. Advice that pre and post exercise glucose checks will be done for 3 sessions at entry of program.      Cardiac Rehab from 12/26/2015 in [facility] Cardiac and Pulmonary Rehab   Date  07/31/15   Educator  SB   Instruction Review Code  2- meets goals/outcomes       Knowledge Questionnaire Score:   Personal Goals and Risk Factors at Admission:   Personal Goals and Risk Factors Review:      Goals and Risk Factor Review      08/23/15 1537 08/29/15 1157 10/09/15 1340 12/04/15 0737 12/07/15 0931   Core Components/Risk Factors/Patient Goals Review   Personal Goals Review Increase Aerobic Exercise and Physical Activity    Hypertension;Diabetes;Lipids   Increase Aerobic Exercise and Physical Activity (read-only)   Goals Progress/Improvement seen  No No No Yes    Comments Jaimeson has been out of Cardiac Rehab since 08/06/2015 since he  told Roanna Epley that "exercising is antogonizing his tumor/cancer".  Darwyn called [place] and said he wants to return to Cardiac REhab in about a month. He is currently getting a cancer treatment today. [place] has nutritionist. [place] was also reported he was in the hospital for Blood clots. Louis Alexander is progressing well ,especially considering he was out for several months. The cancer and treatments were affecting his exercise and he is feeling much better now. Until he has more consistent attendance, we will hold off on interval training and adding exercise at home. We will follow up with him in February on how he is feeling with the exercise and add new goals at that time.     Diabetes (read-only)   Goal --  Tlaloc has been out of Cardiac Rehab since 08/06/2015 since he told Roanna Epley that "exercising is antogonizing his tumor/cancer".  --  See [facility] lab work. Has been out since Sept from Cardiac Rehab.      Progress seen towards goals     No   Comments     No change   Hypertension (read-only)   Goal --  Devante has been out of Cardiac Rehab since 08/06/2015 since he told Roanna Epley that "exercising is antogonizing his tumor/cancer".  --  Baine called [place] and said he wants to return to Cardiac REhab in about a month. He is currently getting a cancer treatment today. [place] has nutritionist. [place] was also reported he was in the hospital for Blood clot     Progress seen toward goals     No   Comments     No change, BP medication is doing its job  and BP readings in class are in appropriate range   Abnormal Lipids (read-only)  Goal   --  Maritza called [place] and said he wants to return to Cardiac REhab in about a month. He is currently getting a cancer treatment today. [place] has nutritionist. [place] was also reported he was in the hospital for Blood clot     Progress seen towards goals     No   Comments     Lipids have not been re-evaluated     12/31/15 1035            Increase Aerobic Exercise and Physical Activity (read-only)   Goals Progress/Improvement seen  Yes       Comments Louis Alexander continues to progress despite ongoing health issues, his attendance has been more consistent which has helped with better exercise progression and him feeling more confident with exercise workloads. Louis Alexander is going to try to do some activity at home on days he is not in class. He will start with one day a week of doing light activity at home. We will follow up with him in the coming weeks on how that home exercise is going.           Personal Goals Discharge (Final Personal Goals and Risk Factors Review):      Goals and Risk Factor Review - 12/31/15 1035    Increase Aerobic Exercise and Physical Activity (read-only)   Goals Progress/Improvement seen  Yes   Comments Louis Alexander continues to progress despite ongoing health issues, his attendance has been more consistent which has helped with better exercise progression and him feeling more confident with exercise workloads. Louis Alexander is going to try to do some activity at home on days he is not in class. He will start with one day a week of doing light activity at home. We will follow up with him in the coming weeks on how that home exercise is going.       ITP Comments:     ITP Comments      10/22/15 1216 11/18/15 1225 11/23/15 0946 12/13/15 1134 01/08/16 0804   ITP Comments 30 day review preparation: Continue with ITP Ready for 30 day review.  Continue with ITP  Has only attended 4 sessions. Last on 08/06/2015. Has been out with medical concerns. Plans to return Jan 4.  Louis Alexander did well after first day back on Wed. He was tired today, so was advised to slow down and take rest breaks as needed.  Ready for 30 day review. Continue with ITP. 30 day review.  Continue with ITP.  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 He attends sessions as often as he can.      01/28/16 1259           ITP Comments I called Elenore Rota and he and his wife are packing up their house since their house is up for sale. He is nauseaseated today. He is suppose to get another cancer treatment tomorrow.           Comments: I called Anna and he and his wife are packing up their house since their house is up for sale. He is nauseaseated today. He is suppose to get another cancer treatment tomorrow. "It is too much for him to do Cardiac REhab now"

## 2016-01-29 ENCOUNTER — Other Ambulatory Visit: Payer: Self-pay | Admitting: *Deleted

## 2016-01-29 ENCOUNTER — Inpatient Hospital Stay (HOSPITAL_BASED_OUTPATIENT_CLINIC_OR_DEPARTMENT_OTHER): Payer: PPO | Admitting: Oncology

## 2016-01-29 ENCOUNTER — Inpatient Hospital Stay: Payer: PPO

## 2016-01-29 VITALS — BP 147/93 | HR 64 | Temp 97.1°F | Resp 16 | Wt 175.9 lb

## 2016-01-29 DIAGNOSIS — E785 Hyperlipidemia, unspecified: Secondary | ICD-10-CM

## 2016-01-29 DIAGNOSIS — C689 Malignant neoplasm of urinary organ, unspecified: Secondary | ICD-10-CM

## 2016-01-29 DIAGNOSIS — E1165 Type 2 diabetes mellitus with hyperglycemia: Secondary | ICD-10-CM

## 2016-01-29 DIAGNOSIS — R918 Other nonspecific abnormal finding of lung field: Secondary | ICD-10-CM | POA: Diagnosis not present

## 2016-01-29 DIAGNOSIS — R630 Anorexia: Secondary | ICD-10-CM

## 2016-01-29 DIAGNOSIS — C680 Malignant neoplasm of urethra: Secondary | ICD-10-CM | POA: Diagnosis not present

## 2016-01-29 DIAGNOSIS — R110 Nausea: Secondary | ICD-10-CM

## 2016-01-29 DIAGNOSIS — R5383 Other fatigue: Secondary | ICD-10-CM

## 2016-01-29 DIAGNOSIS — R944 Abnormal results of kidney function studies: Secondary | ICD-10-CM

## 2016-01-29 DIAGNOSIS — C679 Malignant neoplasm of bladder, unspecified: Secondary | ICD-10-CM

## 2016-01-29 DIAGNOSIS — I252 Old myocardial infarction: Secondary | ICD-10-CM

## 2016-01-29 DIAGNOSIS — Z8546 Personal history of malignant neoplasm of prostate: Secondary | ICD-10-CM

## 2016-01-29 DIAGNOSIS — R531 Weakness: Secondary | ICD-10-CM

## 2016-01-29 DIAGNOSIS — Z79899 Other long term (current) drug therapy: Secondary | ICD-10-CM

## 2016-01-29 DIAGNOSIS — I10 Essential (primary) hypertension: Secondary | ICD-10-CM

## 2016-01-29 DIAGNOSIS — Z5111 Encounter for antineoplastic chemotherapy: Secondary | ICD-10-CM | POA: Diagnosis not present

## 2016-01-29 DIAGNOSIS — M129 Arthropathy, unspecified: Secondary | ICD-10-CM

## 2016-01-29 DIAGNOSIS — D649 Anemia, unspecified: Secondary | ICD-10-CM

## 2016-01-29 DIAGNOSIS — I2510 Atherosclerotic heart disease of native coronary artery without angina pectoris: Secondary | ICD-10-CM

## 2016-01-29 DIAGNOSIS — E669 Obesity, unspecified: Secondary | ICD-10-CM

## 2016-01-29 DIAGNOSIS — Z7982 Long term (current) use of aspirin: Secondary | ICD-10-CM

## 2016-01-29 DIAGNOSIS — C787 Secondary malignant neoplasm of liver and intrahepatic bile duct: Secondary | ICD-10-CM

## 2016-01-29 DIAGNOSIS — Z923 Personal history of irradiation: Secondary | ICD-10-CM

## 2016-01-29 DIAGNOSIS — Z7984 Long term (current) use of oral hypoglycemic drugs: Secondary | ICD-10-CM

## 2016-01-29 DIAGNOSIS — F419 Anxiety disorder, unspecified: Secondary | ICD-10-CM

## 2016-01-29 LAB — CBC WITH DIFFERENTIAL/PLATELET
Basophils Absolute: 0.1 10*3/uL (ref 0–0.1)
Basophils Relative: 1 %
Eosinophils Absolute: 0.1 10*3/uL (ref 0–0.7)
Eosinophils Relative: 1 %
HEMATOCRIT: 33.1 % — AB (ref 40.0–52.0)
HEMOGLOBIN: 11.2 g/dL — AB (ref 13.0–18.0)
LYMPHS ABS: 2.1 10*3/uL (ref 1.0–3.6)
LYMPHS PCT: 18 %
MCH: 30.7 pg (ref 26.0–34.0)
MCHC: 33.7 g/dL (ref 32.0–36.0)
MCV: 91.2 fL (ref 80.0–100.0)
MONOS PCT: 16 %
Monocytes Absolute: 1.8 10*3/uL — ABNORMAL HIGH (ref 0.2–1.0)
NEUTROS ABS: 7.3 10*3/uL — AB (ref 1.4–6.5)
NEUTROS PCT: 64 %
Platelets: 332 10*3/uL (ref 150–440)
RBC: 3.64 MIL/uL — ABNORMAL LOW (ref 4.40–5.90)
RDW: 16.9 % — ABNORMAL HIGH (ref 11.5–14.5)
WBC: 11.4 10*3/uL — ABNORMAL HIGH (ref 3.8–10.6)

## 2016-01-29 LAB — COMPREHENSIVE METABOLIC PANEL
ALBUMIN: 3.7 g/dL (ref 3.5–5.0)
ALK PHOS: 117 U/L (ref 38–126)
ALT: 19 U/L (ref 17–63)
ANION GAP: 8 (ref 5–15)
AST: 25 U/L (ref 15–41)
BILIRUBIN TOTAL: 0.5 mg/dL (ref 0.3–1.2)
BUN: 30 mg/dL — ABNORMAL HIGH (ref 6–20)
CALCIUM: 9.3 mg/dL (ref 8.9–10.3)
CO2: 24 mmol/L (ref 22–32)
Chloride: 100 mmol/L — ABNORMAL LOW (ref 101–111)
Creatinine, Ser: 1.68 mg/dL — ABNORMAL HIGH (ref 0.61–1.24)
GFR calc non Af Amer: 39 mL/min — ABNORMAL LOW (ref >60)
GFR, EST AFRICAN AMERICAN: 45 mL/min — AB (ref >60)
GLUCOSE: 209 mg/dL — AB (ref 65–99)
Potassium: 5.1 mmol/L (ref 3.5–5.1)
Sodium: 132 mmol/L — ABNORMAL LOW (ref 135–145)
TOTAL PROTEIN: 7.3 g/dL (ref 6.5–8.1)

## 2016-01-29 LAB — TSH: TSH: 4.035 u[IU]/mL (ref 0.350–4.500)

## 2016-01-29 MED ORDER — ALPRAZOLAM 0.25 MG PO TABS
0.2500 mg | ORAL_TABLET | Freq: Two times a day (BID) | ORAL | Status: DC | PRN
Start: 2016-01-29 — End: 2016-04-29

## 2016-01-29 MED ORDER — HEPARIN SOD (PORK) LOCK FLUSH 100 UNIT/ML IV SOLN
500.0000 [IU] | Freq: Once | INTRAVENOUS | Status: AC | PRN
  Administered 2016-01-29: 500 [IU]
  Filled 2016-01-29: qty 5

## 2016-01-29 MED ORDER — SODIUM CHLORIDE 0.9% FLUSH
10.0000 mL | INTRAVENOUS | Status: DC | PRN
  Administered 2016-01-29: 10 mL
  Filled 2016-01-29: qty 10

## 2016-01-29 MED ORDER — SODIUM CHLORIDE 0.9 % IV SOLN
Freq: Once | INTRAVENOUS | Status: AC
  Administered 2016-01-29: 11:00:00 via INTRAVENOUS
  Filled 2016-01-29: qty 1000

## 2016-01-29 MED ORDER — SODIUM CHLORIDE 0.9 % IV SOLN
240.0000 mg | Freq: Once | INTRAVENOUS | Status: AC
  Administered 2016-01-29: 240 mg via INTRAVENOUS
  Filled 2016-01-29: qty 20

## 2016-01-29 NOTE — Progress Notes (Signed)
Patient has been feeling weak and feeling anxious but believes that is due to the stress of selling his house.  He does not have an appetite and has lost 3 pounds since last visit.  Blood pressure is 147/93 today.

## 2016-01-29 NOTE — Progress Notes (Signed)
Opdivo being started for this patient. Sent request to monitor baseline tsh and t4 leves and every 6 weeks while on therapy to Dr Grayland Ormond team. No levels obtained at this time.

## 2016-01-30 LAB — T4: T4 TOTAL: 10.1 ug/dL (ref 4.5–12.0)

## 2016-02-07 ENCOUNTER — Encounter: Payer: Self-pay | Admitting: *Deleted

## 2016-02-07 DIAGNOSIS — Z9861 Coronary angioplasty status: Secondary | ICD-10-CM

## 2016-02-07 NOTE — Progress Notes (Signed)
Cardiac Individual Treatment Plan  Patient Details  Name: Louis Alexander MRN: [id number] Date of Birth: 21-Jul-1941 Referring Provider:  Dionisio David, MD  Initial Encounter Date:       Cardiac Rehab from 07/31/2015 in [facility] Cardiac and Pulmonary Rehab   Date  07/31/15      Visit Diagnosis: S/P PTCA (percutaneous transluminal coronary angioplasty)  Patient's Home Medications on Admission:  Current outpatient prescriptions:  .  ALPRAZolam (XANAX) 0.25 MG tablet, Take 1 tablet (0.25 mg total) by mouth 2 (two) times daily as needed for anxiety., Disp: 30 tablet, Rfl: 0 .  apixaban (ELIQUIS) 5 MG TABS tablet, Take 5 mg by mouth 2 (two) times daily., Disp: , Rfl:  .  aspirin EC 81 MG tablet, Take 81 mg by mouth daily., Disp: , Rfl:  .  atenolol (TENORMIN) 25 MG tablet, Take 25 mg by mouth daily., Disp: , Rfl:  .  canagliflozin (INVOKANA) 300 MG TABS tablet, Take 300 mg by mouth daily before breakfast., Disp: 30 tablet, Rfl: 3 .  Coenzyme Q10 10 MG capsule, Take 10 mg by mouth., Disp: , Rfl:  .  fentaNYL (DURAGESIC - DOSED MCG/HR) 100 MCG/HR, Place 1 patch (100 mcg total) onto the skin every 3 (three) days., Disp: 10 patch, Rfl: 0 .  Fluticasone Furoate-Vilanterol (BREO ELLIPTA) 100-25 MCG/INH AEPB, Inhale 1 Inhaler into the lungs daily., Disp: , Rfl:  .  furosemide (LASIX) 20 MG tablet, Take 20 mg by mouth 2 (two) times daily., Disp: , Rfl:  .  glipiZIDE (GLUCOTROL) 10 MG tablet, TAKE 1 TABLET(S) BY MOUTH DAILY, Disp: 30 tablet, Rfl: 6 .  metFORMIN (GLUCOPHAGE-XR) 500 MG 24 hr tablet, 2 (TWO) TABLET, ORAL, TWO TIMES DAILY, Disp: 120 tablet, Rfl: 11 .  Multiple Vitamin (MULTIVITAMIN WITH MINERALS) TABS tablet, Take 1 tablet by mouth daily., Disp: , Rfl:  .  ondansetron (ZOFRAN) 8 MG tablet, Take 1 tablet (8 mg total) by mouth every 8 (eight) hours as needed for nausea or vomiting., Disp: 60 tablet, Rfl: 2 .  pantoprazole (PROTONIX) 40 MG tablet, Take 40 mg by mouth 2 (two) times daily.,  Disp: , Rfl:  .  potassium chloride (K-DUR) 10 MEQ tablet, Take 10 mEq by mouth daily., Disp: , Rfl:  .  prasugrel (EFFIENT) 10 MG TABS tablet, Take 10 mg by mouth daily. Reported on 12/03/2015, Disp: , Rfl:  .  prochlorperazine (COMPAZINE) 10 MG tablet, TAKE 1 TABLET BY MOUTH EVERY 6 HOURS AS NEEDED, Disp: 40 tablet, Rfl: 2 .  sotalol (BETAPACE) 80 MG tablet, Take by mouth 2 (two) times daily., Disp: , Rfl:  No current facility-administered medications for this visit.  Facility-Administered Medications Ordered in Other Visits:  .  Shower Chin To Toes With 60 mL chlorhexidine (HIBICLENS) the night before surgery, , , Once **AND** Shower Chin To Toes With 60 mL chlorhexidine (HIBICLENS) in AM of surgery after pre-op clip completed, , , Once **AND** chlorhexidine (HIBICLENS) 4 % liquid 4 application, 60 mL, Topical, Once **AND** chlorhexidine (HIBICLENS) 4 % liquid 4 application, 60 mL, Topical, Once, [employer], PA-C .  sodium chloride 0.9 % injection 10 mL, 10 mL, Intracatheter, PRN, Louis Huger, MD, 10 mL at 05/09/15 0900 .  sodium chloride 0.9 % injection 3 mL, 3 mL, Intravenous, PRN, Louis Huger, MD .  sodium chloride flush (NS) 0.9 % injection 10 mL, 10 mL, Intravenous, PRN, Louis Huger, MD, 10 mL at 12/25/15 0915  Past Medical History: Past Medical History  Diagnosis Date  . Abnormal EKG   . Coronary artery disease   . Diabetes mellitus without complication ([place])   . Obesity   . Hyperlipidemia   . Hypertension   . Sleep apnea     could not tolerate cpap mask  . Arthritis     oa  . Cancer [facility])     bladder and prostate  . History of radiation therapy 2012  . History of chemotherapy 2014  . Bladder cancer ([place])   . History of chicken pox   . Myocardial infarction ([place]) 2008 and 2012    x 2    Tobacco Use: History  Smoking status  . Never Smoker   Smokeless tobacco  . Never Used    Labs: Recent Review Flowsheet Data    Labs for ITP Cardiac and  Pulmonary Rehab Latest Ref Rng 10/26/2012 12/22/2014 06/12/2015 09/12/2015   Cholestrol 0 - 200 mg/dL 134 189 - -   LDLCALC - 41 115 - -   HDL 35 - 70 mg/dL 36(L) 41 - -   Trlycerides 40 - 160 mg/dL 284(H) 165(A) - -   Hemoglobin A1c - - 7.2(A) 7.7 9.2       Exercise Target Goals:    Exercise Program Goal: Individual exercise prescription set with THRR, safety & activity barriers. Participant demonstrates ability to understand and report RPE using BORG scale, to self-measure pulse accurately, and to acknowledge the importance of the exercise prescription.  Exercise Prescription Goal: Starting with aerobic activity 30 plus minutes a day, 3 days per week for initial exercise prescription. Provide home exercise prescription and guidelines that participant acknowledges understanding prior to discharge.  Activity Barriers & Risk Stratification:   6 Minute Walk:   Initial Exercise Prescription:   Perform Capillary Blood Glucose checks as needed.  Exercise Prescription Changes:     Exercise Prescription Changes      08/21/15 0700 08/22/15 0700 09/18/15 0700 10/15/15 1400 11/13/15 0700   Exercise Review   Progression --  Only two visits recorded No  Only 2 visits recorded and will be out for several weeks No  Absent since last review; last visit 08/06/15 No  Absent since last review; last visit 08/06/15 No  Absent since last review; last visit 08/06/15   Response to Exercise   Blood Pressure (Admit)  140/80 mmHg      Blood Pressure (Exercise)  168/82 mmHg      Blood Pressure (Exit)  134/72 mmHg      Heart Rate (Admit)  71 bpm      Heart Rate (Exercise)  95 bpm      Heart Rate (Exit)  75 bpm      Rating of Perceived Exertion (Exercise)  12      Symptoms  Tired      Comments  Louis Alexander is going to be out for several weeks due to the cancer that he also has. His workloads may need to be reevaluated upon his return.   Exercise workloads will need to be reviewed upon his return based on his  exercise capacity.    Duration  Progress to 30 minutes of continuous aerobic without signs/symptoms of physical distress Progress to 30 minutes of continuous aerobic without signs/symptoms of physical distress Progress to 30 minutes of continuous aerobic without signs/symptoms of physical distress Progress to 30 minutes of continuous aerobic without signs/symptoms of physical distress   Intensity  Rest + 30 Rest + 30 Rest + 30 Rest + 30   Progression  Progression  Continue progressive overload as per policy without signs/symptoms or physical distress. Continue progressive overload as per policy without signs/symptoms or physical distress. Continue progressive overload as per policy without signs/symptoms or physical distress. Continue progressive overload as per policy without signs/symptoms or physical distress.   Resistance Training   Training Prescription (read-only)  Yes Yes Yes Yes   Weight (read-only)  2 2 2 2    Reps (read-only)  10-15 10-15 10-15 10-15   Interval Training   Interval Training  No No No No   Treadmill   MPH (read-only)  1.5 1.5 1.5 1.5   Grade (read-only)  0 0 0 0   Minutes (read-only)  10 10 10 10    REL-XR   Level (read-only)  2 2 2 2    Watts (read-only)  40 40 40 40   Minutes (read-only)  15 15 15 15      11/30/15 0733 12/12/15 0800 12/26/15 0800 12/28/15 1027 01/28/16 1100   Exercise Review   Progression Yes   Yes No  Absent since last review; last visit 12/28/15   Response to Exercise   Blood Pressure (Admit) 138/82 mmHg   150/78 mmHg --   Blood Pressure (Exercise) 130/74 mmHg   148/80 mmHg --   Blood Pressure (Exit) 114/66 mmHg   118/70 mmHg --   Heart Rate (Admit) 74 bpm   88 bpm --   Heart Rate (Exercise) 88 bpm   86 bpm --   Heart Rate (Exit) 72 bpm   70 bpm --   Rating of Perceived Exertion (Exercise) 13   13 --   Symptoms None None None None None   Comments Louis Alexander is actually in good fitness for being out for so long and feels a lot better. He made some  increases this month and is encouraged by how well he feels. He is very compliant to exercise suggestions and willing to progress.  Reviewed individualized exercise prescription and made increases per departmental policy. Exercise increases were discussed with the patient and they were able to perform the new work loads without issue (no signs or symptoms).  Louis Alexander continues to progress despite ongoing health issues. Louis Alexander is going to try to do some activity at home on days he is not in class. He will start with one day a week of doing light activity at home. Ex. rx. will be re-evaluated upon return due to extended absence   Frequency    Add 1 additional day to program exercise sessions. --   Duration Progress to 30 minutes of continuous aerobic without signs/symptoms of physical distress Progress to 30 minutes of continuous aerobic without signs/symptoms of physical distress Progress to 50 minutes of aerobic without signs/symptoms of physical distress Progress to 50 minutes of aerobic without signs/symptoms of physical distress Progress to 50 minutes of aerobic without signs/symptoms of physical distress   Intensity Rest + 30 Rest + 30 Rest + 30 Rest + 30 Rest + 30   Progression   Progression Continue progressive overload as per policy without signs/symptoms or physical distress. Continue progressive overload as per policy without signs/symptoms or physical distress. Continue progressive overload as per policy without signs/symptoms or physical distress. Continue progressive overload as per policy without signs/symptoms or physical distress. Continue progressive overload as per policy without signs/symptoms or physical distress.   Resistance Training   Training Prescription (read-only) Yes Yes Yes Yes    Weight (read-only) 2 2 2 2     Reps (read-only) 10-15 10-15 10-15 10-15  Interval Training   Interval Training No No No No No   Treadmill   MPH (read-only) 1.7 1.7 1.7 1.7    Grade (read-only) 0 0 0 0     Minutes (read-only) 15 15 15 15     REL-XR   Level (read-only) 5 6 7 7     Watts (read-only) 60 60 60 60    Minutes (read-only) 15 15 20 20     Biostep-RELP   Level (read-only) 5 5 5 7     Watts (read-only) 45 45 45 40    Minutes (read-only) 15 15 15 15        Exercise Comments:   Discharge Exercise Prescription (Final Exercise Prescription Changes):     Exercise Prescription Changes - 01/28/16 1100    Exercise Review   Progression No  Absent since last review; last visit 12/28/15   Response to Exercise   Blood Pressure (Admit) --   Blood Pressure (Exercise) --   Blood Pressure (Exit) --   Heart Rate (Admit) --   Heart Rate (Exercise) --   Heart Rate (Exit) --   Rating of Perceived Exertion (Exercise) --   Symptoms None   Comments Ex. rx. will be re-evaluated upon return due to extended absence   Frequency --   Duration Progress to 50 minutes of aerobic without signs/symptoms of physical distress   Intensity Rest + 30   Progression   Progression Continue progressive overload as per policy without signs/symptoms or physical distress.   Interval Training   Interval Training No      Nutrition:  Target Goals: Understanding of nutrition guidelines, daily intake of sodium <1568m, cholesterol <2035m calories 30% from fat and 7% or less from saturated fats, daily to have 5 or more servings of fruits and vegetables.  Biometrics:    Nutrition Therapy Plan and Nutrition Goals:     Nutrition Therapy & Goals - 12/07/15 0931    Personal Nutrition Goals   Personal Goal #1 DoTimmothy Sourss still doing well with his heart healthy eating and has no concerns or questions      Nutrition Discharge: Rate Your Plate Scores:   Nutrition Goals Re-Evaluation:     Nutrition Goals Re-Evaluation      08/23/15 1536 10/09/15 1333         Personal Goal #1 Re-Evaluation   Personal Goal #1 DoChrisopheras been out of Cardiac Rehab since 08/06/2015 since he told DiRoanna Epleyhat "exercising is  antogonizing his tumor/cancer".  DoJayroalled usKoreand said he wants to return to Cardiac REhab in about a month. He is currently getting a cancer treatment today. ARPacoletas nutritionist.         Psychosocial: Target Goals: Acknowledge presence or absence of depression, maximize coping skills, provide positive support system. Participant is able to verbalize types and ability to use techniques and skills needed for reducing stress and depression.  Initial Review & Psychosocial Screening:   Quality of Life Scores:   PHQ-9:     Recent Review Flowsheet Data    [facility] 05/03/2015   Decreased Interest 0 0   Down, Depressed, Hopeless 0 0   PHQ - 2 Score 0 0   Altered sleeping 3 -   Tired, decreased energy 1 -   Change in appetite 0 -   Feeling bad or failure about yourself  0 -   Trouble concentrating 0 -   Moving slowly or fidgety/restless 1 -   Suicidal thoughts 0 -  PHQ-9 Score 5 -   Difficult doing work/chores Not difficult at all -      Psychosocial Evaluation and Intervention:     Psychosocial Evaluation - 12/12/15 1037    Psychosocial Evaluation & Interventions   Interventions Therapist referral;Encouraged to exercise with the program and follow exercise prescription;Stress management education   Comments Counselor met with Mr. Grabe for initial psychosocical evaluation.  Mr. Mamie Nick has been attending this program for awhile, but is on a different schedule than counselor, so hard to connect.  Mr. Mamie Nick is a 75 year old who had his 4th stent inserted several months ago.  He has a strong support system with a spouse of 86 years, several adult children close by and active involvement in a local church community.  Mr. Mamie Nick has other health issues that are impacting his recovery as he is currently on Chemotherapy due to several cancer spots detected in his body.  Mr. Mamie Nick states he sleeps okay (a little better lately) and his appetite is "not good" due  to the Chemo.  He denies a history of depression or anxiety, but does admit some current symptoms with sadness and tearfulness more lately.  Mr. Mamie Nick reported his mood is a "3" on a scale of 1-10, with "10" being best.  Mr. Mamie Nick states his mood was better several weeks ago, until he learned about additional "spots" of cancer he was not aware of at the time.  He admits his health, trying to get the house sold, and finances are the biggest stressors for him currently.  He had goals for increased energy for this program and has already experienced that, with Mr. Mamie Nick reporting being able to walk faster and further lately.  He plans to join a gym to continue working out consistently upon completion of this program.  Counselor recommended Mr. Mamie Nick see a local therapist to address his mood and provide supportive services during this time with so many stressors and health issues impacting his functioning.  Contact information for the therapist was provided.     Continued Psychosocial Services Needed --  Counselor will follow up with recommendation for Mr. Mamie Nick to see a counselor, as well as participation in stress management  psychoeducational component of this program.        Psychosocial Re-Evaluation:     Psychosocial Re-Evaluation      08/23/15 1537 08/29/15 1157 10/09/15 1342 01/28/16 1300     Psychosocial Re-Evaluation   Interventions Encouraged to attend Cardiac Rehabilitation for the exercise Stress management education      Comments Many stressors including -Erice has been out of Cardiac Rehab since 08/06/2015 since he told Roanna Epley that "exercising is antogonizing his tumor/cancer".  Nathaneil called [place] and said he wants to return to Cardiac REhab in about a month. He is currently getting a cancer treatment today. [place] has a counselor for their patients also. Keean was also reported he was in the hospital for Blood clot I called Elenore Rota and he and his wife are packing up their house since their  house is up for sale. He is nauseaseated today. He is suppose to get another cancer treatment tomorrow. "It is too much for him to do Cardiac REhab now"       Vocational Rehabilitation: Provide vocational rehab assistance to qualifying candidates.   Vocational Rehab Evaluation & Intervention:   Education: Education Goals: Education classes will be provided on a weekly basis, covering required topics. Participant will state understanding/return demonstration of topics  presented.  Learning Barriers/Preferences:   Education Topics: General Nutrition Guidelines/Fats and Fiber: -Group instruction provided by verbal, written material, models and posters to present the general guidelines for heart healthy nutrition. Gives an explanation and review of dietary fats and fiber.   Controlling Sodium/Reading Food Labels: -Group verbal and written material supporting the discussion of sodium use in heart healthy nutrition. Review and explanation with models, verbal and written materials for utilization of the food label.   Exercise Physiology & Risk Factors: - Group verbal and written instruction with models to review the exercise physiology of the cardiovascular system and associated critical values. Details cardiovascular disease risk factors and the goals associated with each risk factor.          Cardiac Rehab from 12/26/2015 in [facility] Cardiac and Pulmonary Rehab   Date  11/21/15   Educator  RM   Instruction Review Code  2- meets goals/outcomes      Aerobic Exercise & Resistance Training: - Gives group verbal and written discussion on the health impact of inactivity. On the components of aerobic and resistive training programs and the benefits of this training and how to safely progress through these programs.   Flexibility, Balance, General Exercise Guidelines: - Provides group verbal and written instruction on the benefits of flexibility and balance training programs. Provides general  exercise guidelines with specific guidelines to those with heart or lung disease. Demonstration and skill practice provided.      Cardiac Rehab from 12/26/2015 in [facility] Cardiac and Pulmonary Rehab   Date  08/06/15   Educator  [facility]   Instruction Review Code  2- meets goals/outcomes      Stress Management: - Provides group verbal and written instruction about the health risks of elevated stress, cause of high stress, and healthy ways to reduce stress.   Depression: - Provides group verbal and written instruction on the correlation between heart/lung disease and depressed mood, treatment options, and the stigmas associated with seeking treatment.   Anatomy & Physiology of the Heart: - Group verbal and written instruction and models provide basic cardiac anatomy and physiology, with the coronary electrical and arterial systems. Review of: AMI, Angina, Valve disease, Heart Failure, Cardiac Arrhythmia, Pacemakers, and the ICD.   Cardiac Procedures: - Group verbal and written instruction and models to describe the testing methods done to diagnose heart disease. Reviews the outcomes of the test results. Describes the treatment choices: Medical Management, Angioplasty, or Coronary Bypass Surgery.   Cardiac Medications: - Group verbal and written instruction to review commonly prescribed medications for heart disease. Reviews the medication, class of the drug, and side effects. Includes the steps to properly store meds and maintain the prescription regimen.      Cardiac Rehab from 12/26/2015 in [facility] Cardiac and Pulmonary Rehab   Date  12/26/15   Educator  SB   Instruction Review Code  2- meets goals/outcomes      Go Sex-Intimacy & Heart Disease, Get SMART - Goal Setting: - Group verbal and written instruction through game format to discuss heart disease and the return to sexual intimacy. Provides group verbal and written material to discuss and apply goal setting through the application of the  S.M.A.R.T. Method.   Other Matters of the Heart: - Provides group verbal, written materials and models to describe Heart Failure, Angina, Valve Disease, and Diabetes in the realm of heart disease. Includes description of the disease process and treatment options available to the cardiac patient.   Exercise & Equipment Safety: -  Individual verbal instruction and demonstration of equipment use and safety with use of the equipment.      Cardiac Rehab from 12/26/2015 in [facility] Cardiac and Pulmonary Rehab   Date  08/06/15   Educator  RM   Instruction Review Code  2- meets goals/outcomes      Infection Prevention: - Provides verbal and written material to individual with discussion of infection control including proper hand washing and proper equipment cleaning during exercise session.      Cardiac Rehab from 12/26/2015 in [facility] Cardiac and Pulmonary Rehab   Date  08/06/15   Educator  RM   Instruction Review Code  2- meets goals/outcomes      Falls Prevention: - Provides verbal and written material to individual with discussion of falls prevention and safety.   Diabetes: - Individual verbal and written instruction to review signs/symptoms of diabetes, desired ranges of glucose level fasting, after meals and with exercise. Advice that pre and post exercise glucose checks will be done for 3 sessions at entry of program.      Cardiac Rehab from 12/26/2015 in [facility] Cardiac and Pulmonary Rehab   Date  07/31/15   Educator  SB   Instruction Review Code  2- meets goals/outcomes       Knowledge Questionnaire Score:   Personal Goals and Risk Factors at Admission:   Personal Goals and Risk Factors Review:      Goals and Risk Factor Review      08/23/15 1537 08/29/15 1157 10/09/15 1340 12/04/15 0737 12/07/15 0931   Core Components/Risk Factors/Patient Goals Review   Personal Goals Review Increase Aerobic Exercise and Physical Activity    Hypertension;Diabetes;Lipids   Increase Aerobic  Exercise and Physical Activity (read-only)   Goals Progress/Improvement seen  No No No Yes    Comments Jayzon has been out of Cardiac Rehab since 08/06/2015 since he told Roanna Epley that "exercising is antogonizing his tumor/cancer".  Luay called [place] and said he wants to return to Cardiac REhab in about a month. He is currently getting a cancer treatment today. [place] has nutritionist. Deshan was also reported he was in the hospital for Blood clots. Louis Alexander is progressing well ,especially considering he was out for several months. The cancer and treatments were affecting his exercise and he is feeling much better now. Until he has more consistent attendance, we will hold off on interval training and adding exercise at home. We will follow up with him in February on how he is feeling with the exercise and add new goals at that time.     Diabetes (read-only)   Goal --  Kingsly has been out of Cardiac Rehab since 08/06/2015 since he told Roanna Epley that "exercising is antogonizing his tumor/cancer".  --  See [facility] lab work. Has been out since Sept from Cardiac Rehab.      Progress seen towards goals     No   Comments     No change   Hypertension (read-only)   Goal --  Hatem has been out of Cardiac Rehab since 08/06/2015 since he told Roanna Epley that "exercising is antogonizing his tumor/cancer".  --  Riaan called [place] and said he wants to return to Cardiac REhab in about a month. He is currently getting a cancer treatment today. [place] has nutritionist. Tayvian was also reported he was in the hospital for Blood clot     Progress seen toward goals     No   Comments  No change, BP medication is doing its job  and BP readings in class are in appropriate range   Abnormal Lipids (read-only)   Goal   --  Mahad called [place] and said he wants to return to Cardiac REhab in about a month. He is currently getting a cancer treatment today. [place] has nutritionist. Noel  was also reported he was in the hospital for Blood clot     Progress seen towards goals     No   Comments     Lipids have not been re-evaluated     12/31/15 1035 01/28/16 1301         Core Components/Risk Factors/Patient Goals Review   Personal Goals Review  Diabetes;Hypertension;Lipids      Review  I just called and Johanna was lying down resting since his is nauseated from his cancer treatments she said. She said it is hard for him to eat with the nausea. Leanard's wife said she knows he hopes to get back exercising in Cardiac Rehab in a month or so.       Expected Outcomes  Long term to be able to exercise in Cardiac Rehab to help his HTN, Diabetes and Lipids.       Increase Aerobic Exercise and Physical Activity (read-only)   Goals Progress/Improvement seen  Yes       Comments Louis Alexander continues to progress despite ongoing health issues, his attendance has been more consistent which has helped with better exercise progression and him feeling more confident with exercise workloads. Louis Alexander is going to try to do some activity at home on days he is not in class. He will start with one day a week of doing light activity at home. We will follow up with him in the coming weeks on how that home exercise is going.           Personal Goals Discharge (Final Personal Goals and Risk Factors Review):      Goals and Risk Factor Review - 01/28/16 1301    Core Components/Risk Factors/Patient Goals Review   Personal Goals Review Diabetes;Hypertension;Lipids   Review I just called and Sheddrick was lying down resting since his is nauseated from his cancer treatments she said. She said it is hard for him to eat with the nausea. Aldous's wife said she knows he hopes to get back exercising in Cardiac Rehab in a month or so.    Expected Outcomes Long term to be able to exercise in Cardiac Rehab to help his HTN, Diabetes and Lipids.       ITP Comments:     ITP Comments      10/22/15 1216 11/18/15 1225 11/23/15 0946  12/13/15 1134 01/08/16 0804   ITP Comments 30 day review preparation: Continue with ITP Ready for 30 day review.  Continue with ITP  Has only attended 4 sessions. Last on 08/06/2015. Has been out with medical concerns. Plans to return Jan 4.  Louis Alexander did well after first day back on Wed. He was tired today, so was advised to slow down and take rest breaks as needed.  Ready for 30 day review. Continue with ITP. 30 day review.  Continue with ITP.  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 He attends sessions as often as he can.     01/28/16 1259 02/07/16 1258         ITP Comments I called Elenore Rota and he  and his wife are packing up their house since their house is up for sale. He is nauseaseated today. He is suppose to get another cancer treatment tomorrow.  30 Day Review. Continue with the ITP.  Remains out secondary to Chemo symptoms.         Comments:

## 2016-02-07 NOTE — Progress Notes (Signed)
[facility]  Telephone:(336) [phone] Fax:(336) [phone]  ID: Louis Alexander OB: [birth year]  MR#: [id number]  [id number]  Patient Care Team: Birdie Sons, MD as PCP - General (Family Medicine) Murrell Redden, MD (Urology) Dionisio David, MD as Consulting Physician (Cardiology) Lloyd Huger, MD as Consulting Physician (Oncology)  CHIEF COMPLAINT:  Chief Complaint  Patient presents with  . urothelial cancer    INTERVAL HISTORY: Patient returns to clinic for further evaluation and to initiate cycle 1 of nivolumab. He is anxious and feels weak, but attributes this to a recent selling of his house.  He continues to have nausea and a poor appetite, but states these have improved. His pain is currently well-controlled.  He is highly anxious.  He does not complain of chest pain or shortness of breath. He has no neurologic complaints. Patient denies any fevers. He denies any vomiting, constipation, or diarrhea. Patient offers no further specific complaints today.   REVIEW OF SYSTEMS:   Review of Systems  Constitutional: Positive for malaise/fatigue. Negative for fever.  Respiratory: Negative.  Negative for shortness of breath.   Cardiovascular: Negative.  Negative for chest pain.  Gastrointestinal: Positive for nausea and abdominal pain.  Genitourinary: Negative.   Musculoskeletal: Negative.   Neurological: Negative.   Psychiatric/Behavioral: The patient is nervous/anxious.     As per HPI. Otherwise, a complete review of systems is negatve.  PAST MEDICAL HISTORY: Past Medical History  Diagnosis Date  . Abnormal EKG   . Coronary artery disease   . Diabetes mellitus without complication ([place])   . Obesity   . Hyperlipidemia   . Hypertension   . Sleep apnea     could not tolerate cpap mask  . Arthritis     oa  . Cancer [facility])     bladder and prostate  . History of radiation therapy 2012  . History of chemotherapy 2014  . Bladder cancer ([place])   .  History of chicken pox   . Myocardial infarction ([place]) 2008 and 2012    x 2    PAST SURGICAL HISTORY: Past Surgical History  Procedure Laterality Date  . Cardiac catheterization  12-06-2003  . Lad stent  2000 x1 stent, 2008 x 1 stent, 2012 x 1 stent  . Hernia repair  yrs ago  . Appendectomy  many yrs ago  . Rotator cuff repair Left yrs ago  . Seed implant to prostate with radiation  2012  . Protatectomy and urostomy  2014  . Total hip arthroplasty Right 12/27/2013    Procedure: RIGHT TOTAL HIP ARTHROPLASTY ANTERIOR APPROACH;  Surgeon: Mauri Pole, MD;  Location: [facility];  Service: Orthopedics;  Laterality: Right;  . Cardiac catheterization N/A 06/04/2015    Procedure: Left Heart Cath and Coronary Angiography;  Surgeon: Corey Skains, MD;  Location: [place] [facility];  Service: Cardiovascular;  Laterality: N/A;  . Cardiac catheterization N/A 06/04/2015    Procedure: Coronary Stent Intervention;  Surgeon: Wellington Hampshire, MD;  Location: [place] [facility];  Service: Cardiovascular;  Laterality: N/A;  . Peripheral vascular catheterization N/A 09/25/2015    Procedure: IVC Filter Insertion;  Surgeon: Katha Cabal, MD;  Location: [place] CV LAB;  Service: Cardiovascular;  Laterality: N/A;    FAMILY HISTORY: Reviewed and unchanged. No report of malignancy or chronic disease.     ADVANCED DIRECTIVES:    HEALTH MAINTENANCE: Social History  Substance Use Topics  . Smoking status: Never Smoker   .  Smokeless tobacco: Never Used  . Alcohol Use: 0.0 oz/week    0 Standard drinks or equivalent per week     Comment: occasional     Colonoscopy:  PAP:  Bone density:  Lipid panel:  Allergies  Allergen Reactions  . Celebrex [Celecoxib]     Hematuria Other reaction(s): Bleeding Bleeding in bladder  . Effient [Prasugrel] Other (See Comments)    blood clots, blood in urine Other reaction(s): Bleeding Bleeding in bladder    Current Outpatient Prescriptions    Medication Sig Dispense Refill  . apixaban (ELIQUIS) 5 MG TABS tablet Take 5 mg by mouth 2 (two) times daily.    [name] aspirin EC 81 MG tablet Take 81 mg by mouth daily.    [name] atenolol (TENORMIN) 25 MG tablet Take 25 mg by mouth daily.    . canagliflozin (INVOKANA) 300 MG TABS tablet Take 300 mg by mouth daily before breakfast. 30 tablet 3  . Coenzyme Q10 10 MG capsule Take 10 mg by mouth.    . fentaNYL (DURAGESIC - DOSED MCG/HR) 100 MCG/HR Place 1 patch (100 mcg total) onto the skin every 3 (three) days. 10 patch 0  . Fluticasone Furoate-Vilanterol (BREO ELLIPTA) 100-25 MCG/INH AEPB Inhale 1 Inhaler into the lungs daily.    . furosemide (LASIX) 20 MG tablet Take 20 mg by mouth 2 (two) times daily.    [name] glipiZIDE (GLUCOTROL) 10 MG tablet TAKE 1 TABLET(S) BY MOUTH DAILY 30 tablet 6  . metFORMIN (GLUCOPHAGE-XR) 500 MG 24 hr tablet 2 (TWO) TABLET, ORAL, TWO TIMES DAILY 120 tablet 11  . Multiple Vitamin (MULTIVITAMIN WITH MINERALS) TABS tablet Take 1 tablet by mouth daily.    . ondansetron (ZOFRAN) 8 MG tablet Take 1 tablet (8 mg total) by mouth every 8 (eight) hours as needed for nausea or vomiting. 60 tablet 2  . pantoprazole (PROTONIX) 40 MG tablet Take 40 mg by mouth 2 (two) times daily.    . potassium chloride (K-DUR) 10 MEQ tablet Take 10 mEq by mouth daily.    . prasugrel (EFFIENT) 10 MG TABS tablet Take 10 mg by mouth daily. Reported on 12/03/2015    . prochlorperazine (COMPAZINE) 10 MG tablet TAKE 1 TABLET BY MOUTH EVERY 6 HOURS AS NEEDED 40 tablet 2  . sotalol (BETAPACE) 80 MG tablet Take by mouth 2 (two) times daily.    [name] ALPRAZolam (XANAX) 0.25 MG tablet Take 1 tablet (0.25 mg total) by mouth 2 (two) times daily as needed for anxiety. 30 tablet 0   No current facility-administered medications for this visit.   Facility-Administered Medications Ordered in Other Visits  Medication Dose Route Frequency Provider Last Rate Last Dose  . chlorhexidine (HIBICLENS) 4 % liquid 4 application  60 mL  Topical Once Danae Orleans, PA-C       And  . chlorhexidine (HIBICLENS) 4 % liquid 4 application  60 mL Topical Once Danae Orleans, PA-C      . sodium chloride 0.9 % injection 10 mL  10 mL Intracatheter PRN Lloyd Huger, MD   10 mL at 05/09/15 0900  . sodium chloride 0.9 % injection 3 mL  3 mL Intravenous PRN Lloyd Huger, MD      . sodium chloride flush (NS) 0.9 % injection 10 mL  10 mL Intravenous PRN Lloyd Huger, MD   10 mL at 12/25/15 0915    OBJECTIVE: Filed Vitals:   01/29/16 0949  BP: 147/93  Pulse: 64  Temp: 97.1 F (36.2 C)  Resp: 16     Body mass index is 25.24 kg/(m^2).    ECOG FS:2 - Symptomatic, <50% confined to bed  General: Well-developed, well-nourished, no acute distress. Eyes: anicteric sclera. Lungs: Clear to auscultation bilaterally. Heart: Regular rate and rhythm. No rubs, murmurs, or gallops. Abdomen: Soft, nontender, nondistended. No organomegaly noted, normoactive bowel sounds. Musculoskeletal: No edema, cyanosis, or clubbing. Neuro: Alert, answering all questions appropriately. Cranial nerves grossly intact. Skin: No rashes or petechiae noted. Psych: Normal affect.    LAB RESULTS:  Lab Results  Component Value Date   NA 132* 01/29/2016   K 5.1 01/29/2016   CL 100* 01/29/2016   CO2 24 01/29/2016   GLUCOSE 209* 01/29/2016   BUN 30* 01/29/2016   CREATININE 1.68* 01/29/2016   CALCIUM 9.3 01/29/2016   PROT 7.3 01/29/2016   ALBUMIN 3.7 01/29/2016   AST 25 01/29/2016   ALT 19 01/29/2016   ALKPHOS 117 01/29/2016   BILITOT 0.5 01/29/2016   GFRNONAA 39* 01/29/2016   GFRAA 45* 01/29/2016    Lab Results  Component Value Date   WBC 11.4* 01/29/2016   NEUTROABS 7.3* 01/29/2016   HGB 11.2* 01/29/2016   HCT 33.1* 01/29/2016   MCV 91.2 01/29/2016   PLT 332 01/29/2016     STUDIES: No results found.  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  PLAN:    1.  Urothelial carcinoma:  PET scan  results from December 20, 2015 reported as above with at least stable disease. Given patient's persistent symptoms and declining performance status, cisplatin and gemcitabine was discontinued. Proceed with cycle 1 of nivolumab 240 mg every 2 weeks. Return to clinic in  2 weeks for consideration of cycle 2.  If patient progresses can consider treatment with atezolizumab. 2.  Lung nodule: Pathology result reviewed independently with hyphae noted on sample. Appreciate infectious disease input. Patient currently taking voriconazole and will require treatment for at least 2-3 months.  3. Pain: Improved. Likely secondary to malignancy. Patient previously had XRT to his pelvic area. Fentanyl patch was increased to 100 mcg Q 72 hours. 4. Hyperglycemia: Patient is diabetic. Continue glipizide and metformin as prescribed. Monitor. 5. Anemia: Mild, monitor. 6. Atrial fibrillation: Patient's heart is in regular rhythm today. Continue treatment and follow up with cardiology. 7. Nausea: Improved. Continue Zofran as needed. 8. Creatinine: Slightly improved. Proceed with IV fluids again today.  Patient expressed understanding and was in agreement with this plan. He also understands that He can call clinic at any time with any questions, concerns, or complaints.   Urothelial cancer   Staging form: Kidney, AJCC 7th Edition     Clinical stage from 03/16/2015: Stage IV (TX, N1, M1) - Signed by Lloyd Huger, MD on 03/16/2015  Lloyd Huger, MD   02/07/2016 11:48 PM

## 2016-02-12 ENCOUNTER — Other Ambulatory Visit: Payer: Self-pay | Admitting: Family Medicine

## 2016-02-12 ENCOUNTER — Inpatient Hospital Stay: Payer: PPO

## 2016-02-12 ENCOUNTER — Inpatient Hospital Stay (HOSPITAL_BASED_OUTPATIENT_CLINIC_OR_DEPARTMENT_OTHER): Payer: PPO | Admitting: Family Medicine

## 2016-02-12 VITALS — BP 136/87 | HR 80 | Temp 96.4°F | Resp 18 | Ht 70.0 in | Wt 170.4 lb

## 2016-02-12 DIAGNOSIS — E785 Hyperlipidemia, unspecified: Secondary | ICD-10-CM

## 2016-02-12 DIAGNOSIS — R944 Abnormal results of kidney function studies: Secondary | ICD-10-CM

## 2016-02-12 DIAGNOSIS — R918 Other nonspecific abnormal finding of lung field: Secondary | ICD-10-CM

## 2016-02-12 DIAGNOSIS — E669 Obesity, unspecified: Secondary | ICD-10-CM

## 2016-02-12 DIAGNOSIS — C787 Secondary malignant neoplasm of liver and intrahepatic bile duct: Secondary | ICD-10-CM

## 2016-02-12 DIAGNOSIS — Z8546 Personal history of malignant neoplasm of prostate: Secondary | ICD-10-CM

## 2016-02-12 DIAGNOSIS — R531 Weakness: Secondary | ICD-10-CM

## 2016-02-12 DIAGNOSIS — C680 Malignant neoplasm of urethra: Secondary | ICD-10-CM | POA: Diagnosis not present

## 2016-02-12 DIAGNOSIS — I10 Essential (primary) hypertension: Secondary | ICD-10-CM

## 2016-02-12 DIAGNOSIS — F419 Anxiety disorder, unspecified: Secondary | ICD-10-CM

## 2016-02-12 DIAGNOSIS — E1165 Type 2 diabetes mellitus with hyperglycemia: Secondary | ICD-10-CM

## 2016-02-12 DIAGNOSIS — Z7982 Long term (current) use of aspirin: Secondary | ICD-10-CM

## 2016-02-12 DIAGNOSIS — R5383 Other fatigue: Secondary | ICD-10-CM

## 2016-02-12 DIAGNOSIS — Z5111 Encounter for antineoplastic chemotherapy: Secondary | ICD-10-CM | POA: Diagnosis not present

## 2016-02-12 DIAGNOSIS — Z923 Personal history of irradiation: Secondary | ICD-10-CM

## 2016-02-12 DIAGNOSIS — M129 Arthropathy, unspecified: Secondary | ICD-10-CM

## 2016-02-12 DIAGNOSIS — I252 Old myocardial infarction: Secondary | ICD-10-CM

## 2016-02-12 DIAGNOSIS — R630 Anorexia: Secondary | ICD-10-CM

## 2016-02-12 DIAGNOSIS — I2510 Atherosclerotic heart disease of native coronary artery without angina pectoris: Secondary | ICD-10-CM

## 2016-02-12 DIAGNOSIS — R112 Nausea with vomiting, unspecified: Secondary | ICD-10-CM | POA: Diagnosis not present

## 2016-02-12 DIAGNOSIS — Z79899 Other long term (current) drug therapy: Secondary | ICD-10-CM

## 2016-02-12 DIAGNOSIS — Z7984 Long term (current) use of oral hypoglycemic drugs: Secondary | ICD-10-CM

## 2016-02-12 DIAGNOSIS — C679 Malignant neoplasm of bladder, unspecified: Secondary | ICD-10-CM

## 2016-02-12 DIAGNOSIS — D649 Anemia, unspecified: Secondary | ICD-10-CM

## 2016-02-12 LAB — CBC WITH DIFFERENTIAL/PLATELET
Basophils Absolute: 0.1 10*3/uL (ref 0–0.1)
Basophils Relative: 1 %
EOS ABS: 0.5 10*3/uL (ref 0–0.7)
EOS PCT: 4 %
HCT: 33.5 % — ABNORMAL LOW (ref 40.0–52.0)
HEMOGLOBIN: 11.5 g/dL — AB (ref 13.0–18.0)
Lymphocytes Relative: 14 %
Lymphs Abs: 1.7 10*3/uL (ref 1.0–3.6)
MCH: 30.6 pg (ref 26.0–34.0)
MCHC: 34.3 g/dL (ref 32.0–36.0)
MCV: 89.1 fL (ref 80.0–100.0)
MONOS PCT: 10 %
Monocytes Absolute: 1.3 10*3/uL — ABNORMAL HIGH (ref 0.2–1.0)
NEUTROS PCT: 71 %
Neutro Abs: 8.8 10*3/uL — ABNORMAL HIGH (ref 1.4–6.5)
Platelets: 354 10*3/uL (ref 150–440)
RBC: 3.76 MIL/uL — ABNORMAL LOW (ref 4.40–5.90)
RDW: 16.6 % — ABNORMAL HIGH (ref 11.5–14.5)
WBC: 12.4 10*3/uL — ABNORMAL HIGH (ref 3.8–10.6)

## 2016-02-12 LAB — COMPREHENSIVE METABOLIC PANEL
ALT: 20 U/L (ref 17–63)
ANION GAP: 11 (ref 5–15)
AST: 30 U/L (ref 15–41)
Albumin: 3.8 g/dL (ref 3.5–5.0)
Alkaline Phosphatase: 141 U/L — ABNORMAL HIGH (ref 38–126)
BILIRUBIN TOTAL: 0.2 mg/dL — AB (ref 0.3–1.2)
BUN: 33 mg/dL — ABNORMAL HIGH (ref 6–20)
CALCIUM: 9.6 mg/dL (ref 8.9–10.3)
CO2: 21 mmol/L — ABNORMAL LOW (ref 22–32)
Chloride: 101 mmol/L (ref 101–111)
Creatinine, Ser: 1.93 mg/dL — ABNORMAL HIGH (ref 0.61–1.24)
GFR calc Af Amer: 38 mL/min — ABNORMAL LOW (ref >60)
GFR, EST NON AFRICAN AMERICAN: 33 mL/min — AB (ref >60)
Glucose, Bld: 159 mg/dL — ABNORMAL HIGH (ref 65–99)
POTASSIUM: 4.7 mmol/L (ref 3.5–5.1)
Sodium: 133 mmol/L — ABNORMAL LOW (ref 135–145)
TOTAL PROTEIN: 7.4 g/dL (ref 6.5–8.1)

## 2016-02-12 MED ORDER — SODIUM CHLORIDE 0.9 % IV SOLN
240.0000 mg | Freq: Once | INTRAVENOUS | Status: AC
  Administered 2016-02-12: 240 mg via INTRAVENOUS
  Filled 2016-02-12: qty 20

## 2016-02-12 MED ORDER — SODIUM CHLORIDE 0.9 % IV SOLN
Freq: Once | INTRAVENOUS | Status: AC
  Administered 2016-02-12: 11:00:00 via INTRAVENOUS
  Filled 2016-02-12: qty 2

## 2016-02-12 MED ORDER — HEPARIN SOD (PORK) LOCK FLUSH 100 UNIT/ML IV SOLN
500.0000 [IU] | Freq: Once | INTRAVENOUS | Status: AC
  Administered 2016-02-12: 500 [IU] via INTRAVENOUS
  Filled 2016-02-12: qty 5

## 2016-02-12 MED ORDER — POSACONAZOLE 40 MG/ML PO SUSP: 400.0000 mg | Freq: Two times a day (BID) | ORAL | Status: DC

## 2016-02-12 MED ORDER — SODIUM CHLORIDE 0.9 % IV SOLN
Freq: Once | INTRAVENOUS | Status: AC
  Administered 2016-02-12: 11:00:00 via INTRAVENOUS
  Filled 2016-02-12: qty 1000

## 2016-02-12 MED ORDER — SODIUM CHLORIDE 0.9 % IV SOLN
INTRAVENOUS | Status: DC
  Administered 2016-02-12: 11:00:00 via INTRAVENOUS
  Filled 2016-02-12: qty 1000

## 2016-02-12 MED ORDER — PROCHLORPERAZINE MALEATE 10 MG PO TABS: 10.0000 mg | ORAL_TABLET | Freq: Four times a day (QID) | ORAL | Status: DC | PRN

## 2016-02-12 MED ORDER — SODIUM CHLORIDE 0.9% FLUSH
10.0000 mL | INTRAVENOUS | Status: DC | PRN
Start: 2016-02-12 — End: 2016-02-12
  Administered 2016-02-12: 10 mL via INTRAVENOUS
  Filled 2016-02-12: qty 10

## 2016-02-12 MED ORDER — SODIUM CHLORIDE 0.9 % IV SOLN
Freq: Once | INTRAVENOUS | Status: AC
  Filled 2016-02-12: qty 1000

## 2016-02-12 MED ORDER — DOXYCYCLINE HYCLATE 100 MG PO TABS: 100.0000 mg | ORAL_TABLET | Freq: Two times a day (BID) | ORAL | Status: DC

## 2016-02-12 NOTE — Progress Notes (Signed)
Per Magda Paganini, NP proceed with treatment with current labs available.  LJ

## 2016-02-12 NOTE — Progress Notes (Signed)
Louis Alexander  Telephone:(336) [phone] Fax:(336) [phone]  ID: [name] OB: [birth year]  MR#: [id number]  [id number]  Patient Care Team: Birdie Sons, MD as PCP - General (Family Medicine) Murrell Redden, MD (Urology) Dionisio David, MD as Consulting Physician (Cardiology) Lloyd Huger, MD as Consulting Physician (Oncology)  CHIEF COMPLAINT:  Chief Complaint  Patient presents with  . Urothelial Cancer  . Chemotherapy    Opdivo    INTERVAL HISTORY: Patient returns to clinic for further evaluation and consideration of cycle 2 of nivolumab. He continues to have nausea with vomiting and no appetite. He has been feeling poorly since about 3 weeks ago when starting Vfend for the fungal infection in his lungs. He has lost weight, about 8 pounds in the past 3 weeks.  His pain is currently well-controlled with the fentanyl patch.  He is highly anxious.  He does not complain of chest pain or shortness of breath. He has no neurologic complaints. Patient denies any fevers. He denies any diarrhea but he has had occasional constipation but it is controlled with miralax.[name]   REVIEW OF SYSTEMS:   Review of Systems  Constitutional: Positive for malaise/fatigue. Negative for fever.  Respiratory: Negative.  Negative for shortness of breath.   Cardiovascular: Negative.  Negative for chest pain.  Gastrointestinal: Positive for nausea, vomiting and constipation. Negative for abdominal pain.  Genitourinary: Negative.   Musculoskeletal: Negative.   Neurological: Negative.   Psychiatric/Behavioral: The patient is nervous/anxious.     As per HPI. Otherwise, a complete review of systems is negatve.  PAST MEDICAL HISTORY: Past Medical History  Diagnosis Date  . Abnormal EKG   . Coronary artery disease   . Diabetes mellitus without complication ([place])   . Obesity   . Hyperlipidemia   . Hypertension   . Sleep apnea     could not tolerate cpap mask  . Arthritis     oa  . Cancer [facility])     bladder and prostate  . History of radiation therapy 2012  . History of chemotherapy 2014  . Bladder cancer ([place])   . History of chicken pox   . Myocardial infarction ([place]) 2008 and 2012    x 2    PAST SURGICAL HISTORY: Past Surgical History  Procedure Laterality Date  . Cardiac catheterization  12-06-2003  . Lad stent  2000 x1 stent, 2008 x 1 stent, 2012 x 1 stent  . Hernia repair  yrs ago  . Appendectomy  many yrs ago  . Rotator cuff repair Left yrs ago  . Seed implant to prostate with radiation  2012  . Protatectomy and urostomy  2014  . Total hip arthroplasty Right 12/27/2013    Procedure: RIGHT TOTAL HIP ARTHROPLASTY ANTERIOR APPROACH;  Surgeon: Mauri Pole, MD;  [facility];  Service: Orthopedics;  Laterality: Right;  . Cardiac catheterization N/A 06/04/2015    Procedure: Left Heart Cath and Coronary Angiography;  Surgeon: Corey Skains, MD;  Location: [place] [facility];  Service: Cardiovascular;  Laterality: N/A;  . Cardiac catheterization N/A 06/04/2015    Procedure: Coronary Stent Intervention;  Surgeon: Wellington Hampshire, MD;  Location: [place] [facility];  Service: Cardiovascular;  Laterality: N/A;  . Peripheral vascular catheterization N/A 09/25/2015    Procedure: IVC Filter Insertion;  Surgeon: Katha Cabal, MD;  Location: [place] [facility] LAB;  Service: Cardiovascular;  Laterality: N/A;    FAMILY HISTORY: Reviewed and unchanged. No report of malignancy or  chronic disease.     ADVANCED DIRECTIVES:    HEALTH MAINTENANCE: Social History  Substance Use Topics  . Smoking status: Never Smoker   . Smokeless tobacco: Never Used  . Alcohol Use: 0.0 oz/week    0 Standard drinks or equivalent per week     Comment: occasional     Allergies  Allergen Reactions  . Celebrex [Celecoxib]     Hematuria Other reaction(s): Bleeding Bleeding in bladder  . Effient [Prasugrel] Other (See Comments)    blood clots, blood in  urine Other reaction(s): Bleeding Bleeding in bladder    Current Outpatient Prescriptions  Medication Sig Dispense Refill  . ALPRAZolam (XANAX) 0.25 MG tablet Take 1 tablet (0.25 mg total) by mouth 2 (two) times daily as needed for anxiety. 30 tablet 0  . apixaban (ELIQUIS) 5 MG TABS tablet Take 5 mg by mouth 2 (two) times daily.    [name] aspirin EC 81 MG tablet Take 81 mg by mouth daily.    [name] atenolol (TENORMIN) 25 MG tablet Take 25 mg by mouth daily.    . canagliflozin (INVOKANA) 300 MG TABS tablet Take 300 mg by mouth daily before breakfast. 30 tablet 3  . Coenzyme Q10 10 MG capsule Take 10 mg by mouth.    . fentaNYL (DURAGESIC - DOSED MCG/HR) 100 MCG/HR Place 1 patch (100 mcg total) onto the skin every 3 (three) days. 10 patch 0  . Fluticasone Furoate-Vilanterol (BREO ELLIPTA) 100-25 MCG/INH AEPB Inhale 1 Inhaler into the lungs daily.    . furosemide (LASIX) 20 MG tablet Take 20 mg by mouth 2 (two) times daily.    [name] glipiZIDE (GLUCOTROL) 10 MG tablet TAKE 1 TABLET(S) BY MOUTH DAILY 30 tablet 6  . metFORMIN (GLUCOPHAGE-XR) 500 MG 24 hr tablet 2 (TWO) TABLET, ORAL, TWO TIMES DAILY 120 tablet 11  . Multiple Vitamin (MULTIVITAMIN WITH MINERALS) TABS tablet Take 1 tablet by mouth daily.    . ondansetron (ZOFRAN) 8 MG tablet Take 1 tablet (8 mg total) by mouth every 8 (eight) hours as needed for nausea or vomiting. 60 tablet 2  . pantoprazole (PROTONIX) 40 MG tablet Take 40 mg by mouth 2 (two) times daily.    . potassium chloride (K-DUR) 10 MEQ tablet Take 10 mEq by mouth daily.    . prasugrel (EFFIENT) 10 MG TABS tablet Take 10 mg by mouth daily. Reported on 12/03/2015    . prochlorperazine (COMPAZINE) 10 MG tablet TAKE 1 TABLET BY MOUTH EVERY 6 HOURS AS NEEDED 40 tablet 2  . sotalol (BETAPACE) 80 MG tablet Take by mouth 2 (two) times daily.    . posaconazole (NOXAFIL) 40 MG/ML suspension Take 10 mLs (400 mg total) by mouth 2 (two) times daily. 237 mL 2   Current Facility-Administered  Medications  Medication Dose Route Frequency Provider Last Rate Last Dose  . ondansetron (ZOFRAN) 4 mg in sodium chloride 0.9 % 50 mL IVPB   Intravenous Once Mayra Reel, NP       Facility-Administered Medications Ordered in Other Visits  Medication Dose Route Frequency Provider Last Rate Last Dose  . 0.9 %  sodium chloride infusion   Intravenous Once Lloyd Huger, MD      . chlorhexidine (HIBICLENS) 4 % liquid 4 application  60 mL Topical Once Danae Orleans, PA-C       And  . chlorhexidine (HIBICLENS) 4 % liquid 4 application  60 mL Topical Once Danae Orleans, PA-C      . heparin lock flush 100  unit/mL  500 Units Intravenous Once Lloyd Huger, MD      . nivolumab (OPDIVO) 240 mg in sodium chloride 0.9 % 100 mL chemo infusion  240 mg Intravenous Once Lloyd Huger, MD      . sodium chloride 0.9 % injection 10 mL  10 mL Intracatheter PRN Lloyd Huger, MD   10 mL at 05/09/15 0900  . sodium chloride 0.9 % injection 3 mL  3 mL Intravenous PRN Lloyd Huger, MD      . sodium chloride flush (NS) 0.9 % injection 10 mL  10 mL Intravenous PRN Lloyd Huger, MD   10 mL at 12/25/15 0915  . sodium chloride flush (NS) 0.9 % injection 10 mL  10 mL Intravenous PRN Lloyd Huger, MD   10 mL at 02/12/16 0850    OBJECTIVE: Filed Vitals:   02/12/16 0859  BP: 136/87  Pulse: 80  Temp: 96.4 F (35.8 C)  Resp: 18     Body mass index is 24.45 kg/(m^2).    ECOG FS:2 - Symptomatic, <50% confined to bed  General: Well-developed, well-nourished, no acute distress. Eyes: anicteric sclera. Lungs: Clear to auscultation bilaterally. Heart: Regular rate and rhythm. No rubs, murmurs, or gallops. Abdomen: Soft, nontender, nondistended. No organomegaly noted, normoactive bowel sounds. Musculoskeletal: No edema, cyanosis, or clubbing. Neuro: Alert, answering all questions appropriately. Cranial nerves grossly intact. Skin: No rashes or petechiae noted. Psych: Normal  affect.    LAB RESULTS:  Lab Results  Component Value Date   NA 133* 02/12/2016   K 4.7 02/12/2016   CL 101 02/12/2016   CO2 21* 02/12/2016   GLUCOSE 159* 02/12/2016   BUN 33* 02/12/2016   CREATININE 1.93* 02/12/2016   CALCIUM 9.6 02/12/2016   PROT 7.4 02/12/2016   ALBUMIN 3.8 02/12/2016   AST 30 02/12/2016   ALT 20 02/12/2016   ALKPHOS 141* 02/12/2016   BILITOT 0.2* 02/12/2016   GFRNONAA 33* 02/12/2016   GFRAA 38* 02/12/2016    Lab Results  Component Value Date   WBC 12.4* 02/12/2016   NEUTROABS 8.8* 02/12/2016   HGB 11.5* 02/12/2016   HCT 33.5* 02/12/2016   MCV 89.1 02/12/2016   PLT 354 02/12/2016     STUDIES: No results found.  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  PLAN:    1.  Urothelial carcinoma:  PET scan results from December 20, 2015 reported as above with at least stable disease. Given patient's persistent symptoms and declining performance status, cisplatin and gemcitabine was discontinued. Proceed with cycle 2 of nivolumab 240 mg every 2 weeks. Return to clinic in  2 weeks for consideration of cycle 3.  If patient progresses can consider treatment with atezolizumab. 2.  Lung nodule: Pathology result reviewed independently with hyphae noted on sample.  Patient taking voriconazole but has been losing weight and is unable to eat. Dr Ola Spurr was not available today so on recommendation of Dr Flossie Dibble, ID in [place], changing treatment today with posaconazole liquid 400 mg Q 12 H  with food. Patient has appointment on Monday with Dr Ola Spurr. Posaconazole had an extremely high copay of more than $1000, discussed this with patient and we will try and better manage his nausea until Dr. Ola Spurr returns and can make his recommendation.  3. Pain: Improved. Likely secondary to malignancy. Patient previously had XRT to his pelvic area. Fentanyl patch was increased to 100 mcg Q 72 hours.  4. Hyperglycemia: Patient is  diabetic. Continue  glipizide and metformin as prescribed. Monitor. 5. Anemia: Mild, monitor. 6. Atrial fibrillation: Patient's heart is in regular rhythm today. Continue treatment and follow up with cardiology. 7. Nausea/Vomiting: IV ondansetron with treatment today. Called compazine to pharmacy today for use at home PRN. 8. Creatinine: Increased to 1.93 today.  Proceed with IV fluids again today. 9. Constipation: Miralax PRN.  Dr. Mike Gip was available for consultation and review of plan of care for this patient. Patient expressed understanding and was in agreement with this plan. He also understands that He can call clinic at any time with any questions, concerns, or complaints.   Urothelial cancer   Staging form: Kidney, AJCC 7th Edition     Clinical stage from 03/16/2015: Stage IV (TX, N1, M1) - Signed by Lloyd Huger, MD on 03/16/2015  Mayra Reel, NP   02/12/2016 10:35 AM   Patient was seen and evaluated independently and I agree with the assessment and plan as dictated above.  Georgeanne Nim, NP 02/12/2016 11:38am

## 2016-02-17 ENCOUNTER — Emergency Department
Admission: EM | Admit: 2016-02-17 | Discharge: 2016-02-17 | Disposition: A | Discharge status: Home / Self Care | Payer: PPO | Attending: Emergency Medicine | Admitting: Emergency Medicine

## 2016-02-17 ENCOUNTER — Encounter: Payer: Self-pay | Admitting: Emergency Medicine

## 2016-02-17 ENCOUNTER — Emergency Department: Payer: PPO

## 2016-02-17 DIAGNOSIS — Z7982 Long term (current) use of aspirin: Secondary | ICD-10-CM | POA: Diagnosis not present

## 2016-02-17 DIAGNOSIS — E119 Type 2 diabetes mellitus without complications: Secondary | ICD-10-CM | POA: Insufficient documentation

## 2016-02-17 DIAGNOSIS — M1990 Unspecified osteoarthritis, unspecified site: Secondary | ICD-10-CM | POA: Insufficient documentation

## 2016-02-17 DIAGNOSIS — Y999 Unspecified external cause status: Secondary | ICD-10-CM | POA: Insufficient documentation

## 2016-02-17 DIAGNOSIS — W0110XA Fall on same level from slipping, tripping and stumbling with subsequent striking against unspecified object, initial encounter: Secondary | ICD-10-CM | POA: Insufficient documentation

## 2016-02-17 DIAGNOSIS — I4891 Unspecified atrial fibrillation: Secondary | ICD-10-CM | POA: Insufficient documentation

## 2016-02-17 DIAGNOSIS — S0181XA Laceration without foreign body of other part of head, initial encounter: Secondary | ICD-10-CM

## 2016-02-17 DIAGNOSIS — Y9389 Activity, other specified: Secondary | ICD-10-CM | POA: Insufficient documentation

## 2016-02-17 DIAGNOSIS — I10 Essential (primary) hypertension: Secondary | ICD-10-CM | POA: Insufficient documentation

## 2016-02-17 DIAGNOSIS — S01412A Laceration without foreign body of left cheek and temporomandibular area, initial encounter: Secondary | ICD-10-CM | POA: Insufficient documentation

## 2016-02-17 DIAGNOSIS — Z794 Long term (current) use of insulin: Secondary | ICD-10-CM | POA: Insufficient documentation

## 2016-02-17 DIAGNOSIS — Z8546 Personal history of malignant neoplasm of prostate: Secondary | ICD-10-CM | POA: Diagnosis not present

## 2016-02-17 DIAGNOSIS — I2510 Atherosclerotic heart disease of native coronary artery without angina pectoris: Secondary | ICD-10-CM | POA: Insufficient documentation

## 2016-02-17 DIAGNOSIS — S0990XA Unspecified injury of head, initial encounter: Secondary | ICD-10-CM

## 2016-02-17 DIAGNOSIS — G9009 Other idiopathic peripheral autonomic neuropathy: Secondary | ICD-10-CM | POA: Insufficient documentation

## 2016-02-17 DIAGNOSIS — E669 Obesity, unspecified: Secondary | ICD-10-CM | POA: Insufficient documentation

## 2016-02-17 DIAGNOSIS — Y929 Unspecified place or not applicable: Secondary | ICD-10-CM | POA: Diagnosis not present

## 2016-02-17 DIAGNOSIS — I213 ST elevation (STEMI) myocardial infarction of unspecified site: Secondary | ICD-10-CM | POA: Insufficient documentation

## 2016-02-17 DIAGNOSIS — Z79899 Other long term (current) drug therapy: Secondary | ICD-10-CM | POA: Diagnosis not present

## 2016-02-17 DIAGNOSIS — E785 Hyperlipidemia, unspecified: Secondary | ICD-10-CM | POA: Diagnosis not present

## 2016-02-17 MED ORDER — LIDOCAINE-EPINEPHRINE (PF) 1 %-1:200000 IJ SOLN
INTRAMUSCULAR | Status: AC
  Filled 2016-02-17: qty 30

## 2016-02-17 NOTE — Discharge Instructions (Signed)
You have been seen in the Emergency Department (ED) today for a laceration (cut).  Please keep the cut clean but do not submerge it in the water.  It has been repaired with staples or sutures that will need to be removed in about 8 days. Please follow up with your doctor, an urgent care, or return to the ED for suture removal.    Please follow up with your doctor as soon as possible regarding today's emergent visit.   Return to the ED or call your doctor if you notice any signs of infection such as fever, increased pain, increased redness, pus, or other symptoms that concern you.   Facial Laceration  A facial laceration is a cut on the face. These injuries can be painful and cause bleeding. Lacerations usually heal quickly, but they need special care to reduce scarring. DIAGNOSIS  Your health care provider will take a medical history, ask for details about how the injury occurred, and examine the wound to determine how deep the cut is. TREATMENT  Some facial lacerations may not require closure. Others may not be able to be closed because of an increased risk of infection. The risk of infection and the chance for successful closure will depend on various factors, including the amount of time since the injury occurred. The wound may be cleaned to help prevent infection. If closure is appropriate, pain medicines may be given if needed. Your health care provider will use stitches (sutures), wound glue (adhesive), or skin adhesive strips to repair the laceration. These tools bring the skin edges together to allow for faster healing and a better cosmetic outcome. If needed, you may also be given a tetanus shot. HOME CARE INSTRUCTIONS  Only take over-the-counter or prescription medicines as directed by your health care provider.  Follow your health care provider's instructions for wound care. These instructions will vary depending on the technique used for closing the wound. For Sutures:  Keep the  wound clean and dry.   If you were given a bandage (dressing), you should change it at least once a day. Also change the dressing if it becomes wet or dirty, or as directed by your health care provider.   Wash the wound with soap and water 2 times a day. Rinse the wound off with water to remove all soap. Pat the wound dry with a clean towel.   After cleaning, apply a thin layer of the antibiotic ointment recommended by your health care provider. This will help prevent infection and keep the dressing from sticking.   You may shower as usual after the first 24 hours. Do not soak the wound in water until the sutures are removed.   Get your sutures removed as directed by your health care provider. With facial lacerations, sutures should usually be taken out after 4-5 days to avoid stitch marks.   Wait a few days after your sutures are removed before applying any makeup. For Skin Adhesive Strips:  Keep the wound clean and dry.   Do not get the skin adhesive strips wet. You may bathe carefully, using caution to keep the wound dry.   If the wound gets wet, pat it dry with a clean towel.   Skin adhesive strips will fall off on their own. You may trim the strips as the wound heals. Do not remove skin adhesive strips that are still stuck to the wound. They will fall off in time.  For Wound Adhesive:  You may briefly wet your wound  in the shower or bath. Do not soak or scrub the wound. Do not swim. Avoid periods of heavy sweating until the skin adhesive has fallen off on its own. After showering or bathing, gently pat the wound dry with a clean towel.   Do not apply liquid medicine, cream medicine, ointment medicine, or makeup to your wound while the skin adhesive is in place. This may loosen the film before your wound is healed.   If a dressing is placed over the wound, be careful not to apply tape directly over the skin adhesive. This may cause the adhesive to be pulled off before the  wound is healed.   Avoid prolonged exposure to sunlight or tanning lamps while the skin adhesive is in place.  The skin adhesive will usually remain in place for 5-10 days, then naturally fall off the skin. Do not pick at the adhesive film.  After Healing: Once the wound has healed, cover the wound with sunscreen during the day for 1 full year. This can help minimize scarring. Exposure to ultraviolet light in the first year will darken the scar. It can take 1-2 years for the scar to lose its redness and to heal completely.  SEEK MEDICAL CARE IF:  You have a fever. SEEK IMMEDIATE MEDICAL CARE IF:  You have redness, pain, or swelling around the wound.   You see ayellowish-white fluid (pus) coming from the wound.    This information is not intended to replace advice given to you by your health care provider. Make sure you discuss any questions you have with your health care provider.   Document Released: 12/11/2004 Document Revised: 11/24/2014 Document Reviewed: 06/16/2013 Elsevier Interactive Patient Education 2016 [place] Injury, Adult You have a head injury. Headaches and throwing up (vomiting) are common after a head injury. It should be easy to wake up from sleeping. Sometimes you must stay in the hospital. Most problems happen within the first 24 hours. Side effects may occur up to 7-10 days after the injury.  WHAT ARE THE TYPES OF HEAD INJURIES? Head injuries can be as minor as a bump. Some head injuries can be more severe. More severe head injuries include:  A jarring injury to the brain (concussion).  A bruise of the brain (contusion). This mean there is bleeding in the brain that can cause swelling.  A cracked skull (skull fracture).  Bleeding in the brain that collects, clots, and forms a bump (hematoma). WHEN SHOULD I GET HELP RIGHT AWAY?   You are confused or sleepy.  You cannot be woken up.  You feel sick to your stomach (nauseous) or keep throwing  up (vomiting).  Your dizziness or unsteadiness is getting worse.  You have very bad, lasting headaches that are not helped by medicine. Take medicines only as told by your doctor.  You cannot use your arms or legs like normal.  You cannot walk.  You notice changes in the black spots in the center of the colored part of your eye (pupil).  You have clear or bloody fluid coming from your nose or ears.  You have trouble seeing. During the next 24 hours after the injury, you must stay with someone who can watch you. This person should get help right away (call 911 in the [place] if you start to shake and are not able to control it (have seizures), you pass out, or you are unable to wake up. HOW CAN I PREVENT A HEAD INJURY IN THE FUTURE?  Wear seat belts.  Wear a helmet while bike riding and playing sports like football.  Stay away from dangerous activities around the house. WHEN CAN I RETURN TO NORMAL ACTIVITIES AND ATHLETICS? See your doctor before doing these activities. You should not do normal activities or play contact sports until 1 week after the following symptoms have stopped:  Headache that does not go away.  Dizziness.  Poor attention.  Confusion.  Memory problems.  Sickness to your stomach or throwing up.  Tiredness.  Fussiness.  Bothered by bright lights or loud noises.  Anxiousness or depression.  Restless sleep. MAKE SURE YOU:   Understand these instructions.  Will watch your condition.  Will get help right away if you are not doing well or get worse.   This information is not intended to replace advice given to you by your health care provider. Make sure you discuss any questions you have with your health care provider.   Document Released: 10/16/2008 Document Revised: 11/24/2014 Document Reviewed: 07/11/2013 Elsevier Interactive Patient Education [employer].

## 2016-02-17 NOTE — ED Notes (Addendum)
Patient tripped on front porch made of concrete and fell head first onto porch hitting the left cheek and side of his face. Small laceration noted to upper cheek.  Denies any other injury. There are a few small scratches on knuckles. Denies hitting any other part of his head.  Denies LOC.  Patient states his foot got caught up while he was carrying groceries.  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 Bleeding well controlled at this time.

## 2016-02-17 NOTE — ED Provider Notes (Signed)
[facility] Emergency Department Provider Note  ____________________________________________  Time seen: Approximately 5:01 PM  I have reviewed the triage vital signs and the nursing notes.   HISTORY  Chief Complaint Facial Laceration    HPI Louis Alexander is a 75 y.o. male who is on Eliquis for a blood clot in the lungs.  Patient was helping unload groceries, when he tripped walking on the porch and fell and struck his right cheek. He denies any headache or pain at this time, but states that the injury occurred about 12:30 and it has kept bleeding for about the last 3-4 hours oozing and he has not been able to get it to stop. Denies any neck pain, numbness tingling or weakness.  Patient reports he just has a small cut that just keeps oozing and doesn't seem to want to stop bleeding which prompted him to come to the ER now.  He had no preceding chest pain. No weakness. No other concerns. States that he simply just "tripped forward. He did not lose consciousness or sustain any other injury.  Past Medical History  Diagnosis Date  . Abnormal EKG   . Coronary artery disease   . Diabetes mellitus without complication ([place])   . Obesity   . Hyperlipidemia   . Hypertension   . Sleep apnea     could not tolerate cpap mask  . Arthritis     oa  . Cancer [facility])     bladder and prostate  . History of radiation therapy 2012  . History of chemotherapy 2014  . Bladder cancer ([place])   . History of chicken pox   . Myocardial infarction [facility]) 2008 and 2012    x 2    Patient Active Problem List   Diagnosis Date Noted  . DVT (deep venous thrombosis) ([place]) 09/29/2015  . Hematuria 09/29/2015  . S/P IVC filter 09/29/2015  . Guaiac positive stools 09/29/2015  . Acute posthemorrhagic anemia 09/29/2015  . Blood transfusion during current hospitalization 09/29/2015  . 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 Hyponatremia 09/29/2015  .  Acute renal failure ([place]) 09/29/2015  . Thrombocytopenia ([place]) 09/29/2015  . Pulmonary emboli ([place]) 09/25/2015  . Pulmonary embolism ([place]) 09/25/2015  . Atrial fibrillation ([place]) 09/13/2015  . Bladder cancer ([place]) 06/11/2015  . Dyslipidemia 06/11/2015  . Arthralgia of hip 06/11/2015  . History of myocardial infarction 06/11/2015  . Degenerative arthritis of hip 06/11/2015  . Urothelial cancer ([place]) 03/16/2015  . S/P right THA, AA 12/27/2013  . Cancer of lateral wall of urinary bladder ([place]) 01/28/2013  . Malignant neoplasm of lateral wall of urinary bladder ([place]) 01/28/2013  . Prostate cancer ([place]) 01/14/2013  . Incomplete bladder emptying 01/14/2013  . Frank hematuria 01/14/2013  . Difficult or painful urination 01/14/2013  . Malignant neoplasm of prostate ([place]) 01/14/2013  . History of colon polyps 11/01/2010  . 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 Diabetes mellitus type 2, uncontrolled ([place]) 08/31/2008  . 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 Benign prostatic hypertrophy without urinary obstruction 07/31/2008  . Coronary artery disease 07/31/2008  . Disturbance of skin sensation 07/26/2008  . Essential (primary) hypertension 07/26/2008  . Idiopathic peripheral autonomic neuropathy 07/26/2008    Past Surgical History  Procedure Laterality Date  . Cardiac catheterization  12-06-2003  . Lad stent  2000 x1 stent, 2008 x 1 stent, 2012 x 1 stent  . Hernia repair  yrs ago  . Appendectomy  many yrs ago  . Rotator cuff repair Left yrs ago  . Seed implant to prostate with radiation  2012  . Protatectomy and urostomy  2014  . Total hip arthroplasty Right 12/27/2013    Procedure: RIGHT TOTAL HIP ARTHROPLASTY ANTERIOR APPROACH;  Surgeon: Mauri Pole, MD;  Location: [facility] OR[facility];  Service: Orthopedics;  Laterality: Right;  . Cardiac catheterization N/A 06/04/2015    Procedure: Left Heart Cath and Coronary Angiography;  Surgeon: Corey Skains,  MD;  Location: [place] CV LAB;  Service: Cardiovascular;  Laterality: N/A;  . Cardiac catheterization N/A 06/04/2015    Procedure: Coronary Stent Intervention;  Surgeon: Wellington Hampshire, MD;  Location: [place] CV LAB;  Service: Cardiovascular;  Laterality: N/A;  . Peripheral vascular catheterization N/A 09/25/2015    Procedure: IVC Filter Insertion;  Surgeon: Katha Cabal, MD;  Location: [place] CV LAB;  Service: Cardiovascular;  Laterality: N/A;    Current Outpatient Rx  Name  Route  Sig  Dispense  Refill  . ALPRAZolam (XANAX) 0.25 MG tablet   Oral   Take 1 tablet (0.25 mg total) by mouth 2 (two) times daily as needed for anxiety.   30 tablet   0   . apixaban (ELIQUIS) 5 MG TABS tablet   Oral   Take 5 mg by mouth 2 (two) times daily.         [name] aspirin EC 81 MG tablet   Oral   Take 81 mg by mouth daily.         [name] atenolol (TENORMIN) 25 MG tablet   Oral   Take 25 mg by mouth daily.         . canagliflozin (INVOKANA) 300 MG TABS tablet   Oral   Take 300 mg by mouth daily before breakfast.   30 tablet   3   . Coenzyme Q10 10 MG capsule   Oral   Take 10 mg by mouth.         . fentaNYL (DURAGESIC - DOSED MCG/HR) 100 MCG/HR   Transdermal   Place 1 patch (100 mcg total) onto the skin every 3 (three) days.   10 patch   0   . Fluticasone Furoate-Vilanterol (BREO ELLIPTA) 100-25 MCG/INH AEPB   Inhalation   Inhale 1 Inhaler into the lungs daily.         . furosemide (LASIX) 20 MG tablet   Oral   Take 20 mg by mouth 2 (two) times daily.         [name] glipiZIDE (GLUCOTROL) 10 MG tablet      TAKE 1 TABLET(S) BY MOUTH DAILY   30 tablet   6     CYCLE FILL MEDICATION. Authorization is required f ...   . metFORMIN (GLUCOPHAGE-XR) 500 MG 24 hr tablet      2 (TWO) TABLET, ORAL, TWO TIMES DAILY   120 tablet   11   . Multiple Vitamin (MULTIVITAMIN WITH MINERALS) TABS tablet   Oral   Take 1 tablet by mouth daily.         . ondansetron  (ZOFRAN) 8 MG tablet   Oral   Take 1 tablet (8 mg total) by mouth every 8 (eight) hours as needed for nausea or vomiting.   60 tablet   2   . pantoprazole (PROTONIX) 40 MG tablet   Oral   Take 40 mg by mouth 2 (two) times daily.         . posaconazole (NOXAFIL) 40  MG/ML suspension   Oral   Take 10 mLs (400 mg total) by mouth 2 (two) times daily.   237 mL   2   . potassium chloride (K-DUR) 10 MEQ tablet   Oral   Take 10 mEq by mouth daily.         . prasugrel (EFFIENT) 10 MG TABS tablet   Oral   Take 10 mg by mouth daily. Reported on 12/03/2015         . prochlorperazine (COMPAZINE) 10 MG tablet      TAKE 1 TABLET BY MOUTH EVERY 6 HOURS AS NEEDED   40 tablet   2     CYCLE FILL MEDICATION. Authorization is required f ...   . prochlorperazine (COMPAZINE) 10 MG tablet   Oral   Take 1 tablet (10 mg total) by mouth every 6 (six) hours as needed for nausea or vomiting.   45 tablet   0   . sotalol (BETAPACE) 80 MG tablet   Oral   Take by mouth 2 (two) times daily.           Allergies Celebrex and Effient  Family History  Problem Relation Age of Onset  . Stroke Father 35  . Aneurysm Father   . Heart attack Mother   . Lung cancer Other   . Stomach cancer Other     Social History Social History  Substance Use Topics  . Smoking status: Never Smoker   . Smokeless tobacco: Never Used  . Alcohol Use: 0.0 oz/week    0 Standard drinks or equivalent per week     Comment: occasional    Review of Systems Constitutional: No fever/chills Eyes: No visual changes. No trouble seeing. Denies pain in the left eye. ENT: No sore throat. Cardiovascular: Denies chest pain. Respiratory: Denies shortness of breath. Gastrointestinal: No abdominal pain.  No nausea, no vomiting.   Musculoskeletal: Negative for back pain. No neck pain. Skin: Negative for rash. Neurological: Negative for headaches, focal weakness or numbness.  10-point ROS otherwise  negative.  ____________________________________________   PHYSICAL EXAM:  VITAL SIGNS: ED Triage Vitals  Enc Vitals Group     BP 02/17/16 1640 133/77 mmHg     Pulse Rate 02/17/16 1640 69     Resp 02/17/16 1640 18     Temp 02/17/16 1640 98.2 F (36.8 C)     Temp Source 02/17/16 1640 Oral     SpO2 02/17/16 1640 100 %     Weight 02/17/16 1640 170 lb (77.111 kg)     Height 02/17/16 1640 5\' 11"  (1.803 m)     Head Cir --      Peak Flow --      Pain Score 02/17/16 1647 0     Pain Loc --      Pain Edu? --      Excl. in [place]? --    Constitutional: Alert and oriented. Well appearing and in no acute distress. Eyes: Conjunctivae are normal. PERRL. EOMI. Head: Atraumatic except for small approximately 2 cm lunate slightly oozing lesion just below the left orbit. Nose: No congestion/rhinnorhea. Mouth/Throat: Mucous membranes are moist.  Oropharynx non-erythematous. Neck: No stridor.  No cervical tenderness. No midline cervical tenderness. Full range of motion of neck without pain. Cardiovascular: Normal rate, regular rhythm. Grossly normal heart sounds.  Good peripheral circulation. Respiratory: Normal respiratory effort.  No retractions. Lungs CTAB. Gastrointestinal: Soft and nontender. No distention. No abdominal bruits. No CVA tenderness. Musculoskeletal: No lower extremity tenderness nor edema.  No joint effusions. Neurologic:  Normal speech and language. No gross focal neurologic deficits are appreciated. Skin:  Skin is warm, dry and intact. No rash noted. Psychiatric: Mood and affect are normal. Speech and behavior are normal.  ____________________________________________   LABS (all labs ordered are listed, but only abnormal results are displayed)  Labs Reviewed - No data to display ____________________________________________  EKG   ____________________________________________  RADIOLOGY   CT Head Wo Contrast (Final result) Result time: 02/17/16 17:31:13   Final result  by Rad Results In Interface (02/17/16 17:31:13)   Narrative:   CLINICAL DATA: Fall today while helping wife with groceries hitting head and face on deck. Patient is on blood thinner medication.  EXAM: CT HEAD WITHOUT CONTRAST  CT MAXILLOFACIAL WITHOUT CONTRAST  TECHNIQUE: Multidetector CT imaging of the head and maxillofacial structures were performed using the standard protocol without intravenous contrast. Multiplanar CT image reconstructions of the maxillofacial structures were also generated.  COMPARISON: None.  FINDINGS: CT HEAD FINDINGS  Ventricles, cisterns and other CSF spaces are within normal. There is no mass, mass effect, shift of midline structures or acute hemorrhage. No evidence of acute infarction. Bones and soft tissues are within normal.  CT MAXILLOFACIAL FINDINGS  Orbits are normal and symmetric. Paranasal sinuses well developed and well aerated without air-fluid levels. There is no evidence of facial bone fracture. Remaining soft tissues are within normal. Minimal degenerative change of the spine.  IMPRESSION: No acute intracranial findings.  No acute facial bone fracture.   Electronically Signed By: Marin Olp M.D. On: 02/17/2016 17:31          CT Maxillofacial WO CM (Final result) Result time: 02/17/16 17:31:13   Final result by Rad Results In Interface (02/17/16 17:31:13)   Narrative:   CLINICAL DATA: Fall today while helping wife with groceries hitting head and face on deck. Patient is on blood thinner medication.  EXAM: CT HEAD WITHOUT CONTRAST  CT MAXILLOFACIAL WITHOUT CONTRAST  TECHNIQUE: Multidetector CT imaging of the head and maxillofacial structures were performed using the standard protocol without intravenous contrast. Multiplanar CT image reconstructions of the maxillofacial structures were also generated.  COMPARISON: None.  FINDINGS: CT HEAD FINDINGS  Ventricles, cisterns and other CSF spaces are  within normal. There is no mass, mass effect, shift of midline structures or acute hemorrhage. No evidence of acute infarction. Bones and soft tissues are within normal.  CT MAXILLOFACIAL FINDINGS  Orbits are normal and symmetric. Paranasal sinuses well developed and well aerated without air-fluid levels. There is no evidence of facial bone fracture. Remaining soft tissues are within normal. Minimal degenerative change of the spine.  IMPRESSION: No acute intracranial findings.  No acute facial bone fracture.   Electronically Signed By: Marin Olp M.D. On: 02/17/2016 17:31       ____________________________________________ Patient believes his tetanus is up-to-date.  PROCEDURES  Procedure(s) performed: Laceration   Critical Care performed: No  LACERATION REPAIR Performed by: Delman Kitten Authorized by: Delman Kitten Consent: Verbal consent obtained. Risks and benefits: risks, benefits and alternatives were discussed Consent given by: patient Patient identity confirmed: provided demographic data Prepped and Draped in normal sterile fashion Wound explored  Laceration Location: Left face, over maxillary region  Laceration Length: 2 cm  No Foreign Bodies seen or palpated  Anesthesia: local infiltration  Local anesthetic: lidocaine 1 % with epinephrine  Anesthetic total: 2 ml  Irrigation method: syringe Amount of cleaning: standard  Skin closure: 6-0 proline   Number of sutures: 5   Technique:  Simple interrupted   Patient tolerance: Patient tolerated the procedure well with no immediate complications.  ____________________________________________   INITIAL IMPRESSION / ASSESSMENT AND PLAN / ED COURSE  Pertinent labs & imaging results that were available during my care of the patient were reviewed by me and considered in my medical decision making (see chart for details).  This presents after tripping and falling on the porch. He has a  laceration over the left maxilla, otherwise no other injury evident. Based on the patient being on Eliquis CT of the head and max face is ordered to evaluate for intracranial hemorrhage or subtle fracture.  Nexus negative.  Denies any other injury. Vital signs stable. Denies any preceding symptoms, no cardiopulmonary symptoms, no syncope or loss of consciousness. Appears likely mechanical fall with laceration with some greater than usual bleeding due to patient being on Eliquis.  Bleeding stopped after applying Surgicel of her wound. No complications. Patient awake alert no distress at time of discharge. Wife driving. Will follow up with primary for suture removal. Careful return precautions for head injury as well as signs and symptoms of infection were laceration and care discussed ____________________________________________   FINAL CLINICAL IMPRESSION(S) / ED DIAGNOSES  Final diagnoses:  Facial laceration, initial encounter  Closed head injury, initial encounter      Delman Kitten, MD 02/17/16 1856

## 2016-02-18 ENCOUNTER — Encounter: Payer: PPO | Attending: Cardiovascular Disease

## 2016-02-18 DIAGNOSIS — I214 Non-ST elevation (NSTEMI) myocardial infarction: Secondary | ICD-10-CM | POA: Insufficient documentation

## 2016-02-18 DIAGNOSIS — Z9861 Coronary angioplasty status: Secondary | ICD-10-CM | POA: Insufficient documentation

## 2016-02-19 ENCOUNTER — Telehealth: Payer: Self-pay | Admitting: Family Medicine

## 2016-02-19 NOTE — Telephone Encounter (Signed)
Pt wife called stating pt fell at home and hit his cheek near his eye.  Pt was discharged from [facility] ER 02/17/2016.  I have scheduled a hospital follow up for Monday/MW

## 2016-02-20 ENCOUNTER — Telehealth: Payer: Self-pay | Admitting: Family Medicine

## 2016-02-20 NOTE — Telephone Encounter (Signed)
Returned call. confirmed that had returned a fax to them approving order.

## 2016-02-20 NOTE — Telephone Encounter (Signed)
[facility] called for clarification on an update notice for an order for a new  leg bag.  The physician signature is in the wrong section so they need clarification.   Her call back is   [phone]  [facility]

## 2016-02-21 ENCOUNTER — Other Ambulatory Visit: Payer: Self-pay | Admitting: *Deleted

## 2016-02-21 ENCOUNTER — Telehealth: Payer: Self-pay | Admitting: Family Medicine

## 2016-02-21 DIAGNOSIS — C689 Malignant neoplasm of urinary organ, unspecified: Secondary | ICD-10-CM

## 2016-02-21 MED ORDER — FENTANYL 100 MCG/HR TD PT72: 100.0000 ug | MEDICATED_PATCH | TRANSDERMAL | Status: DC

## 2016-02-22 ENCOUNTER — Encounter: Payer: Self-pay | Admitting: Family Medicine

## 2016-02-25 ENCOUNTER — Encounter: Payer: Self-pay | Admitting: Family Medicine

## 2016-02-25 ENCOUNTER — Ambulatory Visit (INDEPENDENT_AMBULATORY_CARE_PROVIDER_SITE_OTHER): Payer: PPO | Admitting: Family Medicine

## 2016-02-25 VITALS — BP 124/70 | HR 56 | Temp 97.9°F | Resp 16 | Ht 71.0 in | Wt 172.0 lb

## 2016-02-25 DIAGNOSIS — E1129 Type 2 diabetes mellitus with other diabetic kidney complication: Secondary | ICD-10-CM | POA: Diagnosis not present

## 2016-02-25 DIAGNOSIS — S0181XA Laceration without foreign body of other part of head, initial encounter: Secondary | ICD-10-CM

## 2016-02-25 DIAGNOSIS — N289 Disorder of kidney and ureter, unspecified: Secondary | ICD-10-CM | POA: Diagnosis not present

## 2016-02-25 LAB — POCT GLYCOSYLATED HEMOGLOBIN (HGB A1C)
ESTIMATED AVERAGE GLUCOSE: 134
HEMOGLOBIN A1C: 6.3

## 2016-02-25 NOTE — Progress Notes (Signed)
Patient: Louis Alexander Male    DOB: May 21, 1941   75 y.o.   MRN: [id number] Visit Date: 02/25/2016  Today's Provider: Lelon Huh, MD   Chief Complaint  Patient presents with  . Hospitalization Follow-up   Subjective:    HPI   Follow up ER visit  Patient was seen in ER for [facility] on 02/17/2016. He was treated for Fall and Facial Laceration. Treatment for this included: place of 4 sutures on his cheek bone  He reports good compliance with treatment. He reports this condition is Improved.  ----------------------------------------------------------------------  Still having nausea and vomiting.     Follow up diabetes.   He has been on Invokana since October when his A1c was 9.2. He has not returned for diabetic follow up, but has since has a number of medication issues related to bladder cancer and is on aggressive anti-fungal regiment. He is not eating and drinking well. Has been very week with shuffled gait. He has had a few fells which he states were due to tripping over objects.   BMET    Component Value Date/Time   NA 133* 02/12/2016 0843   NA 137 10/03/2015 1059   NA 133* 03/15/2015 0930   K 4.7 02/12/2016 0843   K 4.5 03/15/2015 0930   CL 101 02/12/2016 0843   CL 100* 03/15/2015 0930   CO2 21* 02/12/2016 0843   CO2 26 03/15/2015 0930   GLUCOSE 159* 02/12/2016 0843   GLUCOSE 196* 10/03/2015 1059   GLUCOSE 179* 03/15/2015 0930   BUN 33* 02/12/2016 0843   BUN 33* 10/03/2015 1059   BUN 21* 03/15/2015 0930   CREATININE 1.93* 02/12/2016 0843   CREATININE 0.96 03/15/2015 0930   CREATININE 1.0 12/22/2014   CALCIUM 9.6 02/12/2016 0843   CALCIUM 8.7* 03/15/2015 0930   GFRNONAA 33* 02/12/2016 0843   GFRNONAA >60 03/15/2015 0930   GFRAA 38* 02/12/2016 0843   GFRAA >60 03/15/2015 0930        Allergies  Allergen Reactions  . Celebrex [Celecoxib]     Hematuria Other reaction(s): Bleeding Bleeding in bladder  . Effient [Prasugrel] Other (See  Comments)    blood clots, blood in urine Other reaction(s): Bleeding Bleeding in bladder   Previous Medications   ALPRAZOLAM (XANAX) 0.25 MG TABLET    Take 1 tablet (0.25 mg total) by mouth 2 (two) times daily as needed for anxiety.   APIXABAN (ELIQUIS) 5 MG TABS TABLET    Take 5 mg by mouth 2 (two) times daily.   ASPIRIN EC 81 MG TABLET    Take 81 mg by mouth daily.   ATENOLOL (TENORMIN) 25 MG TABLET    Take 25 mg by mouth daily.   CANAGLIFLOZIN (INVOKANA) 300 MG TABS TABLET    Take 300 mg by mouth daily before breakfast.   COENZYME Q10 10 MG CAPSULE    Take 10 mg by mouth.   FENTANYL (DURAGESIC - DOSED MCG/HR) 100 MCG/HR    Place 1 patch (100 mcg total) onto the skin every 3 (three) days.   FLUTICASONE FUROATE-VILANTEROL (BREO ELLIPTA) 100-25 MCG/INH AEPB    Inhale 1 Inhaler into the lungs daily.   FUROSEMIDE (LASIX) 20 MG TABLET    Take 20 mg by mouth 2 (two) times daily.   GLIPIZIDE (GLUCOTROL) 10 MG TABLET    TAKE 1 TABLET(S) BY MOUTH DAILY   METFORMIN (GLUCOPHAGE-XR) 500 MG 24 HR TABLET    2 (TWO) TABLET, ORAL, TWO TIMES DAILY  MULTIPLE VITAMIN (MULTIVITAMIN WITH MINERALS) TABS TABLET    Take 1 tablet by mouth daily.   ONDANSETRON (ZOFRAN) 8 MG TABLET    Take 1 tablet (8 mg total) by mouth every 8 (eight) hours as needed for nausea or vomiting.   PANTOPRAZOLE (PROTONIX) 40 MG TABLET    Take 40 mg by mouth 2 (two) times daily.   POSACONAZOLE (NOXAFIL) 40 MG/ML SUSPENSION    Take 10 mLs (400 mg total) by mouth 2 (two) times daily.   POTASSIUM CHLORIDE (K-DUR) 10 MEQ TABLET    Take 10 mEq by mouth daily.   PRASUGREL (EFFIENT) 10 MG TABS TABLET    Take 10 mg by mouth daily. Reported on 12/03/2015   PROCHLORPERAZINE (COMPAZINE) 10 MG TABLET    TAKE 1 TABLET BY MOUTH EVERY 6 HOURS AS NEEDED   PROCHLORPERAZINE (COMPAZINE) 10 MG TABLET    Take 1 tablet (10 mg total) by mouth every 6 (six) hours as needed for nausea or vomiting.   SOTALOL (BETAPACE) 80 MG TABLET    Take by mouth 2 (two) times  daily.    Review of Systems  Constitutional: Negative for fever, chills and appetite change.  Respiratory: Negative for chest tightness, shortness of breath and wheezing.   Cardiovascular: Negative for chest pain and palpitations.  Gastrointestinal: Positive for nausea and vomiting. Negative for abdominal pain.       Stomach is still upset    Social History  Substance Use Topics  . Smoking status: Never Smoker   . Smokeless tobacco: Never Used  . Alcohol Use: 0.0 oz/week    0 Standard drinks or equivalent per week     Comment: occasional   Objective:   BP 124/70 mmHg  Pulse 56  Temp(Src) 97.9 F (36.6 C) (Oral)  Resp 16  Ht _0  (1.803 m)  Wt 172 lb (78.019 kg)  BMI 24.00 kg/m2  SpO2 100%  Physical Exam   General Appearance:    Alert, cooperative, no distress. Appears weak and fatigued.   Eyes:    PERRL, conjunctiva/corneas clear, EOM's intact       Lungs:     Clear to auscultation bilaterally, respirations unlabored  Heart:    Regular rate and rhythm  Neurologic:   Awake, alert, oriented x 3. No apparent focal neurological           defect. Very slow shuffled gait. Requires assistance getting on exam table.   Skin:    Laceration over left malar prominence is healing well with no discharge or erythema.    Results for orders placed or performed in visit on 02/25/16  POCT glycosylated hemoglobin (Hb A1C)  Result Value Ref Range   Hemoglobin A1C 6.3    Est. average glucose Bld gHb Est-mCnc 134         Assessment & Plan:     1. 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 Stop Invokana for now. Call if he sees sugars consistently over 200. Otherwise return in a month - POCT glycosylated hemoglobin (Hb A1C)  2. Laceration of face, initial encounter Remove all sutures from fascial  laceration  3. Renal insufficiency Encourage increased PO intake  Patient has been very weak as complications of chronic conditions and having difficulty performing ADLs. Is unable to prepare meals and is very forgetful making difficult to take his medications  appropriately. His wife brings in form from [place] to obtain home health-aid to help with various ADLs.        Lelon Huh, MD  [facility]

## 2016-02-26 ENCOUNTER — Inpatient Hospital Stay: Payer: PPO | Attending: Oncology

## 2016-02-26 ENCOUNTER — Inpatient Hospital Stay (HOSPITAL_BASED_OUTPATIENT_CLINIC_OR_DEPARTMENT_OTHER): Payer: PPO | Admitting: Oncology

## 2016-02-26 ENCOUNTER — Inpatient Hospital Stay: Payer: PPO

## 2016-02-26 VITALS — BP 130/84 | HR 60 | Temp 97.7°F | Resp 12 | Wt 169.1 lb

## 2016-02-26 DIAGNOSIS — C689 Malignant neoplasm of urinary organ, unspecified: Secondary | ICD-10-CM | POA: Diagnosis not present

## 2016-02-26 DIAGNOSIS — E1165 Type 2 diabetes mellitus with hyperglycemia: Secondary | ICD-10-CM | POA: Insufficient documentation

## 2016-02-26 DIAGNOSIS — M129 Arthropathy, unspecified: Secondary | ICD-10-CM | POA: Diagnosis not present

## 2016-02-26 DIAGNOSIS — I10 Essential (primary) hypertension: Secondary | ICD-10-CM | POA: Diagnosis not present

## 2016-02-26 DIAGNOSIS — R5383 Other fatigue: Secondary | ICD-10-CM

## 2016-02-26 DIAGNOSIS — G4730 Sleep apnea, unspecified: Secondary | ICD-10-CM | POA: Insufficient documentation

## 2016-02-26 DIAGNOSIS — R296 Repeated falls: Secondary | ICD-10-CM | POA: Diagnosis not present

## 2016-02-26 DIAGNOSIS — R112 Nausea with vomiting, unspecified: Secondary | ICD-10-CM | POA: Insufficient documentation

## 2016-02-26 DIAGNOSIS — Z5112 Encounter for antineoplastic immunotherapy: Secondary | ICD-10-CM | POA: Diagnosis not present

## 2016-02-26 DIAGNOSIS — Z9221 Personal history of antineoplastic chemotherapy: Secondary | ICD-10-CM | POA: Diagnosis not present

## 2016-02-26 DIAGNOSIS — Z8546 Personal history of malignant neoplasm of prostate: Secondary | ICD-10-CM | POA: Diagnosis not present

## 2016-02-26 DIAGNOSIS — E669 Obesity, unspecified: Secondary | ICD-10-CM | POA: Diagnosis not present

## 2016-02-26 DIAGNOSIS — E785 Hyperlipidemia, unspecified: Secondary | ICD-10-CM

## 2016-02-26 DIAGNOSIS — Z79899 Other long term (current) drug therapy: Secondary | ICD-10-CM

## 2016-02-26 DIAGNOSIS — Z7984 Long term (current) use of oral hypoglycemic drugs: Secondary | ICD-10-CM

## 2016-02-26 DIAGNOSIS — K5900 Constipation, unspecified: Secondary | ICD-10-CM | POA: Insufficient documentation

## 2016-02-26 DIAGNOSIS — I2510 Atherosclerotic heart disease of native coronary artery without angina pectoris: Secondary | ICD-10-CM

## 2016-02-26 DIAGNOSIS — Z7901 Long term (current) use of anticoagulants: Secondary | ICD-10-CM | POA: Diagnosis not present

## 2016-02-26 DIAGNOSIS — Z7982 Long term (current) use of aspirin: Secondary | ICD-10-CM | POA: Diagnosis not present

## 2016-02-26 DIAGNOSIS — D649 Anemia, unspecified: Secondary | ICD-10-CM | POA: Diagnosis not present

## 2016-02-26 DIAGNOSIS — F419 Anxiety disorder, unspecified: Secondary | ICD-10-CM | POA: Insufficient documentation

## 2016-02-26 DIAGNOSIS — I252 Old myocardial infarction: Secondary | ICD-10-CM

## 2016-02-26 DIAGNOSIS — Z923 Personal history of irradiation: Secondary | ICD-10-CM

## 2016-02-26 DIAGNOSIS — C787 Secondary malignant neoplasm of liver and intrahepatic bile duct: Secondary | ICD-10-CM | POA: Insufficient documentation

## 2016-02-26 DIAGNOSIS — R918 Other nonspecific abnormal finding of lung field: Secondary | ICD-10-CM | POA: Insufficient documentation

## 2016-02-26 DIAGNOSIS — R531 Weakness: Secondary | ICD-10-CM

## 2016-02-26 DIAGNOSIS — B440 Invasive pulmonary aspergillosis: Secondary | ICD-10-CM | POA: Insufficient documentation

## 2016-02-26 DIAGNOSIS — C679 Malignant neoplasm of bladder, unspecified: Secondary | ICD-10-CM

## 2016-02-26 LAB — CBC WITH DIFFERENTIAL/PLATELET
Basophils Absolute: 0.1 10*3/uL (ref 0–0.1)
Basophils Relative: 1 %
Eosinophils Absolute: 0.4 10*3/uL (ref 0–0.7)
Eosinophils Relative: 4 %
HEMATOCRIT: 33.1 % — AB (ref 40.0–52.0)
HEMOGLOBIN: 11.2 g/dL — AB (ref 13.0–18.0)
LYMPHS PCT: 19 %
Lymphs Abs: 1.9 10*3/uL (ref 1.0–3.6)
MCH: 29.7 pg (ref 26.0–34.0)
MCHC: 33.8 g/dL (ref 32.0–36.0)
MCV: 87.9 fL (ref 80.0–100.0)
MONO ABS: 0.9 10*3/uL (ref 0.2–1.0)
MONOS PCT: 9 %
NEUTROS ABS: 6.9 10*3/uL — AB (ref 1.4–6.5)
NEUTROS PCT: 67 %
Platelets: 320 10*3/uL (ref 150–440)
RBC: 3.77 MIL/uL — ABNORMAL LOW (ref 4.40–5.90)
RDW: 16 % — AB (ref 11.5–14.5)
WBC: 10.2 10*3/uL (ref 3.8–10.6)

## 2016-02-26 LAB — COMPREHENSIVE METABOLIC PANEL
ALBUMIN: 3.4 g/dL — AB (ref 3.5–5.0)
ALK PHOS: 141 U/L — AB (ref 38–126)
ALT: 14 U/L — ABNORMAL LOW (ref 17–63)
AST: 22 U/L (ref 15–41)
Anion gap: 7 (ref 5–15)
BILIRUBIN TOTAL: 0.4 mg/dL (ref 0.3–1.2)
BUN: 27 mg/dL — AB (ref 6–20)
CALCIUM: 9.1 mg/dL (ref 8.9–10.3)
CO2: 23 mmol/L (ref 22–32)
Chloride: 99 mmol/L — ABNORMAL LOW (ref 101–111)
Creatinine, Ser: 1.76 mg/dL — ABNORMAL HIGH (ref 0.61–1.24)
GFR calc Af Amer: 42 mL/min — ABNORMAL LOW (ref >60)
GFR, EST NON AFRICAN AMERICAN: 37 mL/min — AB (ref >60)
GLUCOSE: 273 mg/dL — AB (ref 65–99)
POTASSIUM: 4.5 mmol/L (ref 3.5–5.1)
Sodium: 129 mmol/L — ABNORMAL LOW (ref 135–145)
TOTAL PROTEIN: 6.9 g/dL (ref 6.5–8.1)

## 2016-02-26 MED ORDER — SODIUM CHLORIDE 0.9% FLUSH
10.0000 mL | INTRAVENOUS | Status: DC | PRN
  Administered 2016-02-26: 10 mL
  Filled 2016-02-26: qty 10

## 2016-02-26 MED ORDER — SODIUM CHLORIDE 0.9 % IV SOLN: Freq: Once | INTRAVENOUS | Status: DC

## 2016-02-26 MED ORDER — SODIUM CHLORIDE 0.9 % IV SOLN: 1000.0000 mg/m2 | Freq: Once | INTRAVENOUS | Status: DC

## 2016-02-26 MED ORDER — SODIUM CHLORIDE 0.9 % IV SOLN
Freq: Once | INTRAVENOUS | Status: AC
  Administered 2016-02-26: 11:00:00 via INTRAVENOUS
  Filled 2016-02-26: qty 1000

## 2016-02-26 MED ORDER — FENTANYL 100 MCG/HR TD PT72: 100.0000 ug | MEDICATED_PATCH | TRANSDERMAL | Status: DC

## 2016-02-26 MED ORDER — SODIUM CHLORIDE 0.9 % IV SOLN
240.0000 mg | Freq: Once | INTRAVENOUS | Status: AC
  Administered 2016-02-26: 240 mg via INTRAVENOUS
  Filled 2016-02-26: qty 14

## 2016-02-26 MED ORDER — SODIUM CHLORIDE 0.9 % IV SOLN: 35.0000 mg/m2 | Freq: Once | INTRAVENOUS | Status: DC

## 2016-02-26 MED ORDER — PALONOSETRON HCL INJECTION 0.25 MG/5ML: 0.2500 mg | Freq: Once | INTRAVENOUS | Status: DC

## 2016-02-26 MED ORDER — HEPARIN SOD (PORK) LOCK FLUSH 100 UNIT/ML IV SOLN
500.0000 [IU] | Freq: Once | INTRAVENOUS | Status: AC | PRN
  Administered 2016-02-26: 500 [IU]
  Filled 2016-02-26: qty 5

## 2016-02-26 MED ORDER — SODIUM CHLORIDE 0.9 % IJ SOLN
10.0000 mL | INTRAMUSCULAR | Status: DC | PRN
  Filled 2016-02-26: qty 10

## 2016-02-26 NOTE — Progress Notes (Signed)
Since last visit patient went to ER where he got stiches on cheek due to a fall and was having trouble getting the bleeding to stop.  His PCP stopped the Invokana, (for diabetes) and he also has finished the antibiotics prescribed by Dr Caryn Section and the nausea has improved along with a slight increase in appetite.  Has f/u with Dr. Caryn Section tomorrow.  Reports his pain is controlled at 3/10 on pain scale and is requesting refill on Fentanyl patch.

## 2016-02-26 NOTE — Progress Notes (Signed)
Creatinine level is 1.76, parameter stated that it may not be over 1.5.  Per verbal order from Dr. Grayland Ormond, patient may proceed with treatment

## 2016-02-28 NOTE — Progress Notes (Signed)
[place]  Telephone:([phone] Fax:([phone]) [phone]  ID: [name] OB: 1941-08-27  MR#: [id number]  [id number]  Patient Care Team: Birdie Sons, MD as PCP - General (Family Medicine) Murrell Redden, MD (Urology) Dionisio David, MD as Consulting Physician (Cardiology) Lloyd Huger, MD as Consulting Physician (Oncology)  CHIEF COMPLAINT:  Chief Complaint  Patient presents with  . urothelial cancer    INTERVAL HISTORY: Patient returns to clinic for further evaluation and consideration of cycle 3 of nivolumab. He  Was recently seen in the emergency room after a fall. His injuries required stitches after extensive bleeding. His nausea and appetite have improved since stopping Invokana for diabetes and his voriconazole for his fungal infection. His pain is currently well-controlled with the fentanyl patch.  He is highly anxious.  He does not complain of chest pain or shortness of breath. He has no neurologic complaints. Patient denies any fevers. He denies any diarrhea but he has had occasional constipation but it is controlled with miralax.  He offers no further specific complaints today.  REVIEW OF SYSTEMS:   Review of Systems  Constitutional: Positive for malaise/fatigue. Negative for fever.  Respiratory: Negative.  Negative for shortness of breath.   Cardiovascular: Negative.  Negative for chest pain.  Gastrointestinal: Positive for nausea, vomiting and constipation. Negative for abdominal pain.  Genitourinary: Negative.   Musculoskeletal: Negative.   Neurological: Negative.   Psychiatric/Behavioral: The patient is nervous/anxious.     As per HPI. Otherwise, a complete review of systems is negatve.  PAST MEDICAL HISTORY: Past Medical History  Diagnosis Date  . Abnormal EKG   . Coronary artery disease   . Diabetes mellitus without complication ([place])   . Obesity   . Hyperlipidemia   . Hypertension   . Sleep apnea     could not  tolerate cpap mask  . Arthritis     oa  . Cancer [facility])     bladder and prostate  . History of radiation therapy 2012  . History of chemotherapy 2014  . Bladder cancer ([place])   . History of chicken pox   . Myocardial infarction ([place]) 2008 and 2012    x 2    PAST SURGICAL HISTORY: Past Surgical History  Procedure Laterality Date  . Cardiac catheterization  12-06-2003  . Lad stent  2000 x1 stent, 2008 x 1 stent, 2012 x 1 stent  . Hernia repair  yrs ago  . Appendectomy  many yrs ago  . Rotator cuff repair Left yrs ago  . Seed implant to prostate with radiation  2012  . Protatectomy and urostomy  2014  . Total hip arthroplasty Right 12/27/2013    Procedure: RIGHT TOTAL HIP ARTHROPLASTY ANTERIOR APPROACH;  Surgeon: Mauri Pole, MD;  Location: [facility] ORS;  Service: Orthopedics;  Laterality: Right;  . Cardiac catheterization N/A 06/04/2015    Procedure: Left Heart Cath and Coronary Angiography;  Surgeon: Corey Skains, MD;  Location: [place] CV LAB;  Service: Cardiovascular;  Laterality: N/A;  . Cardiac catheterization N/A 06/04/2015    Procedure: Coronary Stent Intervention;  Surgeon: Wellington Hampshire, MD;  Location: [place] CV LAB;  Service: Cardiovascular;  Laterality: N/A;  . Peripheral vascular catheterization N/A 09/25/2015    Procedure: IVC Filter Insertion;  Surgeon: Katha Cabal, MD;  Location: [place] CV LAB;  Service: Cardiovascular;  Laterality: N/A;    FAMILY HISTORY: Reviewed and unchanged. No report of malignancy or chronic disease.  ADVANCED DIRECTIVES:    HEALTH MAINTENANCE: Social History  Substance Use Topics  . Smoking status: Never Smoker   . Smokeless tobacco: Never Used  . Alcohol Use: 0.0 oz/week    0 Standard drinks or equivalent per week     Comment: occasional     Allergies  Allergen Reactions  . Celebrex [Celecoxib]     Hematuria Other reaction(s): Bleeding Bleeding in bladder  . Effient [Prasugrel] Other (See Comments)     blood clots, blood in urine Other reaction(s): Bleeding Bleeding in bladder    Current Outpatient Prescriptions  Medication Sig Dispense Refill  . ALPRAZolam (XANAX) 0.25 MG tablet Take 1 tablet (0.25 mg total) by mouth 2 (two) times daily as needed for anxiety. 30 tablet 0  . apixaban (ELIQUIS) 5 MG TABS tablet Take 5 mg by mouth 2 (two) times daily.    [name] aspirin EC 81 MG tablet Take 81 mg by mouth daily.    [name] atenolol (TENORMIN) 25 MG tablet Take 25 mg by mouth daily.    . Coenzyme Q10 10 MG capsule Take 10 mg by mouth.    . fentaNYL (DURAGESIC - DOSED MCG/HR) 100 MCG/HR Place 1 patch (100 mcg total) onto the skin every 3 (three) days. 10 patch 0  . Fluticasone Furoate-Vilanterol (BREO ELLIPTA) 100-25 MCG/INH AEPB Inhale 1 Inhaler into the lungs daily.    . furosemide (LASIX) 20 MG tablet Take 20 mg by mouth 2 (two) times daily.    [name] glipiZIDE (GLUCOTROL) 10 MG tablet TAKE 1 TABLET(S) BY MOUTH DAILY 30 tablet 6  . metFORMIN (GLUCOPHAGE-XR) 500 MG 24 hr tablet 2 (TWO) TABLET, ORAL, TWO TIMES DAILY 120 tablet 11  . Multiple Vitamin (MULTIVITAMIN WITH MINERALS) TABS tablet Take 1 tablet by mouth daily.    . ondansetron (ZOFRAN) 8 MG tablet Take 1 tablet (8 mg total) by mouth every 8 (eight) hours as needed for nausea or vomiting. 60 tablet 2  . pantoprazole (PROTONIX) 40 MG tablet Take 40 mg by mouth 2 (two) times daily.    . potassium chloride (K-DUR) 10 MEQ tablet Take 10 mEq by mouth daily.    . prasugrel (EFFIENT) 10 MG TABS tablet Take 10 mg by mouth daily. Reported on 12/03/2015    . prochlorperazine (COMPAZINE) 10 MG tablet TAKE 1 TABLET BY MOUTH EVERY 6 HOURS AS NEEDED 40 tablet 2  . sotalol (BETAPACE) 80 MG tablet Take by mouth 2 (two) times daily.    . posaconazole (NOXAFIL) 40 MG/ML suspension Take 10 mLs (400 mg total) by mouth 2 (two) times daily. (Patient not taking: Reported on 02/26/2016) 237 mL 2   No current facility-administered medications for this visit.    Facility-Administered Medications Ordered in Other Visits  Medication Dose Route Frequency Provider Last Rate Last Dose  . chlorhexidine (HIBICLENS) 4 % liquid 4 application  60 mL Topical Once Danae Orleans, PA-C       And  . chlorhexidine (HIBICLENS) 4 % liquid 4 application  60 mL Topical Once Danae Orleans, PA-C      . sodium chloride flush (NS) 0.9 % injection 10 mL  10 mL Intravenous PRN Lloyd Huger, MD   10 mL at 12/25/15 0915    OBJECTIVE: Filed Vitals:   02/26/16 0952  BP: 130/84  Pulse: 60  Temp: 97.7 F (36.5 C)  Resp: 12     Body mass index is 23.59 kg/(m^2).    ECOG FS:2 - Symptomatic, <50% confined to bed  General: Well-developed,  well-nourished, no acute distress. Eyes: anicteric sclera. Lungs: Clear to auscultation bilaterally. Heart: Regular rate and rhythm. No rubs, murmurs, or gallops. Abdomen: Soft, nontender, nondistended. No organomegaly noted, normoactive bowel sounds. Musculoskeletal: No edema, cyanosis, or clubbing. Neuro: Alert, answering all questions appropriately. Cranial nerves grossly intact. Skin: No rashes or petechiae noted. Psych: Normal affect.   LAB RESULTS:  Lab Results  Component Value Date   NA 129* 02/26/2016   K 4.5 02/26/2016   CL 99* 02/26/2016   CO2 23 02/26/2016   GLUCOSE 273* 02/26/2016   BUN 27* 02/26/2016   CREATININE 1.76* 02/26/2016   CALCIUM 9.1 02/26/2016   PROT 6.9 02/26/2016   ALBUMIN 3.4* 02/26/2016   AST 22 02/26/2016   ALT 14* 02/26/2016   ALKPHOS 141* 02/26/2016   BILITOT 0.4 02/26/2016   GFRNONAA 37* 02/26/2016   GFRAA 42* 02/26/2016    Lab Results  Component Value Date   WBC 10.2 02/26/2016   NEUTROABS 6.9* 02/26/2016   HGB 11.2* 02/26/2016   HCT 33.1* 02/26/2016   MCV 87.9 02/26/2016   PLT 320 02/26/2016     STUDIES: Ct Head Wo Contrast  02/17/2016  CLINICAL DATA:  Fall today while helping wife with groceries hitting head and face on deck. Patient is on blood thinner medication.  EXAM: CT HEAD WITHOUT CONTRAST CT MAXILLOFACIAL WITHOUT CONTRAST TECHNIQUE: Multidetector CT imaging of the head and maxillofacial structures were performed using the standard protocol without intravenous contrast. Multiplanar CT image reconstructions of the maxillofacial structures were also generated. COMPARISON:  None. FINDINGS: CT HEAD FINDINGS Ventricles, cisterns and other CSF spaces are within normal. There is no mass, mass effect, shift of midline structures or acute hemorrhage. No evidence of acute infarction. Bones and soft tissues are within normal. CT MAXILLOFACIAL FINDINGS Orbits are normal and symmetric. Paranasal sinuses well developed and well aerated without air-fluid levels. There is no evidence of facial bone fracture. Remaining soft tissues are within normal. Minimal degenerative change of the spine. IMPRESSION: No acute intracranial findings. No acute facial bone fracture. Electronically Signed   By: Marin Olp M.D.   On: 02/17/2016 17:31   Ct Maxillofacial Wo Cm  02/17/2016  CLINICAL DATA:  Fall today while helping wife with groceries hitting head and face on deck. Patient is on blood thinner medication. EXAM: CT HEAD WITHOUT CONTRAST CT MAXILLOFACIAL WITHOUT CONTRAST TECHNIQUE: Multidetector CT imaging of the head and maxillofacial structures were performed using the standard protocol without intravenous contrast. Multiplanar CT image reconstructions of the maxillofacial structures were also generated. COMPARISON:  None. FINDINGS: CT HEAD FINDINGS Ventricles, cisterns and other CSF spaces are within normal. There is no mass, mass effect, shift of midline structures or acute hemorrhage. No evidence of acute infarction. Bones and soft tissues are within normal. CT MAXILLOFACIAL FINDINGS Orbits are normal and symmetric. Paranasal sinuses well developed and well aerated without air-fluid levels. There is no evidence of facial bone fracture. Remaining soft tissues are within normal. Minimal  degenerative change of the spine. IMPRESSION: No acute intracranial findings. No acute facial bone fracture. Electronically Signed   By: Marin Olp M.D.   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  PLAN:    1.  Urothelial carcinoma:  PET scan results from December 20, 2015 reported as above with at least stable disease. Given patient's persistent symptoms and declining performance status, cisplatin and gemcitabine was discontinued. Proceed with cycle 3 of nivolumab 240 mg every 2 weeks. Return  to clinic in  2 weeks for consideration of cycle 4.  If patient progresses can consider treatment with atezolizumab. 2.  Lung nodule: Pathology result reviewed independently with hyphae noted on sample.  Treatment per Dr. Ola Spurr in infectious disease. Voriconazole has been on hold, but patient has a clinic appointment tomorrow for further evaluation and discussion of reinitiating treatment if necessary. 3. Pain: Improved. Likely secondary to malignancy. Patient previously had XRT to his pelvic area. Fentanyl patch was increased to 100 mcg Q 72 hours.  4. Hyperglycemia: Patient is diabetic. Continue glipizide and metformin as prescribed. Invokana  Has been discontinued by primary care. Monitor. 5. Anemia: Mild, monitor. 6. Atrial fibrillation: Patient's heart is in regular rhythm today. Continue treatment and follow up with cardiology. 7. Nausea/Vomiting:   Improved. Continue treatment as above. 8. Creatinine:  Stable, continue to monitor. 9. Constipation: Miralax PRN.  Patient expressed understanding and was in agreement with this plan. He also understands that He can call clinic at any time with any questions, concerns, or complaints.   Urothelial cancer   Staging form: Kidney, AJCC 7th Edition     Clinical stage from 03/16/2015: Stage IV (TX, N1, M1) - Signed by Lloyd Huger, MD on 03/16/2015  Lloyd Huger, MD   02/28/2016 10:32 AM

## 2016-03-03 ENCOUNTER — Telehealth: Payer: Self-pay | Admitting: Family Medicine

## 2016-03-03 NOTE — Telephone Encounter (Signed)
Pt contacted office for refill request on the following medications:  Glucose test stripes for a Element by [employer].  Pt test 1 time a day.  [facility].  CB#[phone]/MW

## 2016-03-03 NOTE — Telephone Encounter (Signed)
[facility] does not carry the Element meter or supplies. Called pt to find out where he got the meter and he said that he does not remember, but it was a long time ago. Patient is requesting a new meter and test strips sent to [facility].

## 2016-03-03 NOTE — Telephone Encounter (Signed)
Please advise 

## 2016-03-03 NOTE — Telephone Encounter (Signed)
OK to call in to check blood sugar once daily, #100, refill x 12. . There are three different element test strips, you'll have to ask the pharmacist which one goes with this meter. Thanks.

## 2016-03-03 NOTE — Telephone Encounter (Signed)
Pharmacy number is busy, have tried to get through several times. Will try again later.

## 2016-03-05 ENCOUNTER — Other Ambulatory Visit: Payer: Self-pay | Admitting: Family Medicine

## 2016-03-05 MED ORDER — GLUCOSE BLOOD VI STRP: ORAL_STRIP | Status: DC

## 2016-03-05 MED ORDER — ONETOUCH ULTRA 2 W/DEVICE KIT: PACK | Status: AC

## 2016-03-09 ENCOUNTER — Encounter: Payer: Self-pay | Admitting: *Deleted

## 2016-03-09 DIAGNOSIS — I214 Non-ST elevation (NSTEMI) myocardial infarction: Secondary | ICD-10-CM

## 2016-03-09 DIAGNOSIS — Z9861 Coronary angioplasty status: Secondary | ICD-10-CM

## 2016-03-09 NOTE — Progress Notes (Signed)
Cardiac Individual Treatment Plan  Patient Details  Name: Louis Alexander MRN: [id number] Date of Birth: 74-30-1942 Referring Provider:    Initial Encounter Date: 75       Cardiac Rehab from 07/31/2015 in [facility] Cardiac and Pulmonary Rehab   Date  07/31/15      Visit Diagnosis: S/P PTCA (percutaneous transluminal coronary angioplasty)  NSTEMI (non-ST elevated myocardial infarction) [facility])  Patient's Home Medications on Admission:  Current outpatient prescriptions:  .  ALPRAZolam (XANAX) 0.25 MG tablet, Take 1 tablet (0.25 mg total) by mouth 2 (two) times daily as needed for anxiety., Disp: 30 tablet, Rfl: 0 .  apixaban (ELIQUIS) 5 MG TABS tablet, Take 5 mg by mouth 2 (two) times daily., Disp: , Rfl:  .  aspirin EC 81 MG tablet, Take 81 mg by mouth daily., Disp: , Rfl:  .  atenolol (TENORMIN) 25 MG tablet, Take 25 mg by mouth daily., Disp: , Rfl:  .  Coenzyme Q10 10 MG capsule, Take 10 mg by mouth., Disp: , Rfl:  .  fentaNYL (DURAGESIC - DOSED MCG/HR) 100 MCG/HR, Place 1 patch (100 mcg total) onto the skin every 3 (three) days., Disp: 10 patch, Rfl: 0 .  Fluticasone Furoate-Vilanterol (BREO ELLIPTA) 100-25 MCG/INH AEPB, Inhale 1 Inhaler into the lungs daily., Disp: , Rfl:  .  furosemide (LASIX) 20 MG tablet, Take 20 mg by mouth 2 (two) times daily., Disp: , Rfl:  .  glipiZIDE (GLUCOTROL) 10 MG tablet, TAKE 1 TABLET(S) BY MOUTH DAILY, Disp: 30 tablet, Rfl: 12 .  glucose blood (ONE TOUCH ULTRA TEST) test strip, Check blood sugar daily. Dx: E11.9, Disp: 100 each, Rfl: 3 .  metFORMIN (GLUCOPHAGE-XR) 500 MG 24 hr tablet, 2 (TWO) TABLET, ORAL, TWO TIMES DAILY, Disp: 120 tablet, Rfl: 11 .  Multiple Vitamin (MULTIVITAMIN WITH MINERALS) TABS tablet, Take 1 tablet by mouth daily., Disp: , Rfl:  .  ondansetron (ZOFRAN) 8 MG tablet, Take 1 tablet (8 mg total) by mouth every 8 (eight) hours as needed for nausea or vomiting., Disp: 60 tablet, Rfl: 2 .  pantoprazole (PROTONIX) 40 MG tablet, Take 40 mg by  mouth 2 (two) times daily., Disp: , Rfl:  .  posaconazole (NOXAFIL) 40 MG/ML suspension, Take 10 mLs (400 mg total) by mouth 2 (two) times daily. (Patient not taking: Reported on 02/26/2016), Disp: 237 mL, Rfl: 2 .  potassium chloride (K-DUR) 10 MEQ tablet, Take 10 mEq by mouth daily., Disp: , Rfl:  .  prasugrel (EFFIENT) 10 MG TABS tablet, Take 10 mg by mouth daily. Reported on 12/03/2015, Disp: , Rfl:  .  prochlorperazine (COMPAZINE) 10 MG tablet, TAKE 1 TABLET BY MOUTH EVERY 6 HOURS AS NEEDED, Disp: 40 tablet, Rfl: 2 .  sotalol (BETAPACE) 80 MG tablet, Take by mouth 2 (two) times daily., Disp: , Rfl:  No current facility-administered medications for this visit.  Facility-Administered Medications Ordered in Other Visits:  .  Shower Chin To Toes With 60 mL chlorhexidine (HIBICLENS) the night before surgery, , , Once **AND** Shower Chin To Toes With 60 mL chlorhexidine (HIBICLENS) in AM of surgery after pre-op clip completed, , , Once **AND** chlorhexidine (HIBICLENS) 4 % liquid 4 application, 60 mL, Topical, Once **AND** chlorhexidine (HIBICLENS) 4 % liquid 4 application, 60 mL, Topical, Once, Danae Orleans, PA-C .  sodium chloride flush (NS) 0.9 % injection 10 mL, 10 mL, Intravenous, PRN, Lloyd Huger, MD, 10 mL at 12/25/15 0915  Past Medical History: Past Medical History  Diagnosis Date  .  Abnormal EKG   . Coronary artery disease   . Diabetes mellitus without complication ([facility])   . Obesity   . Hyperlipidemia   . Hypertension   . Sleep apnea     could not tolerate cpap mask  . Arthritis     oa  . Cancer [facility])     bladder and prostate  . History of radiation therapy 2012  . History of chemotherapy 2014  . Bladder cancer ([place])   . History of chicken pox   . Myocardial infarction ([place]) 2008 and 2012    x 2    Tobacco Use: History  Smoking status  . Never Smoker   Smokeless tobacco  . Never Used    Labs: Recent Review Flowsheet Data    Labs for ITP Cardiac and  Pulmonary Rehab Latest Ref Rng 10/26/2012 12/22/2014 06/12/2015 09/12/2015 02/25/2016   Cholestrol 0 - 200 mg/dL 134 189 - - -   LDLCALC - 41 115 - - -   HDL 35 - 70 mg/dL 36(L) 41 - - -   Trlycerides 40 - 160 mg/dL 284(H) 165(A) - - -   Hemoglobin A1c - - 7.2(A) 7.7 9.2 6.3       Exercise Target Goals:    Exercise Program Goal: Individual exercise prescription set with THRR, safety & activity barriers. Participant demonstrates ability to understand and report RPE using BORG scale, to self-measure pulse accurately, and to acknowledge the importance of the exercise prescription.  Exercise Prescription Goal: Starting with aerobic activity 30 plus minutes a day, 3 days per week for initial exercise prescription. Provide home exercise prescription and guidelines that participant acknowledges understanding prior to discharge.  Activity Barriers & Risk Stratification:   6 Minute Walk:   Initial Exercise Prescription:   Perform Capillary Blood Glucose checks as needed.  Exercise Prescription Changes:     Exercise Prescription Changes      09/18/15 0700 10/15/15 1400 11/13/15 0700 11/30/15 0733 12/12/15 0800   Exercise Review   Progression No  Absent since last review; last visit 08/06/15 No  Absent since last review; last visit 08/06/15 No  Absent since last review; last visit 08/06/15 Yes    Response to Exercise   Blood Pressure (Admit)    138/82 mmHg    Blood Pressure (Exercise)    130/74 mmHg    Blood Pressure (Exit)    114/66 mmHg    Heart Rate (Admit)    74 bpm    Heart Rate (Exercise)    88 bpm    Heart Rate (Exit)    74 bpm    Rating of Perceived Exertion (Exercise)    13    Symptoms    None None   Comments   Exercise workloads will need to be reviewed upon his return based on his exercise capacity.  [name] is actually in good fitness for being out for so long and feels a lot better. He made some increases this month and is encouraged by how well he feels. He is very compliant  to exercise suggestions and willing to progress.    Duration Progress to 30 minutes of continuous aerobic without signs/symptoms of physical distress Progress to 30 minutes of continuous aerobic without signs/symptoms of physical distress Progress to 30 minutes of continuous aerobic without signs/symptoms of physical distress Progress to 30 minutes of continuous aerobic without signs/symptoms of physical distress Progress to 30 minutes of continuous aerobic without signs/symptoms of physical distress   Intensity Rest + 30 Rest +  30 Rest + 30 Rest + 30 Rest + 30   Progression   Progression Continue progressive overload as per policy without signs/symptoms or physical distress. Continue progressive overload as per policy without signs/symptoms or physical distress. Continue progressive overload as per policy without signs/symptoms or physical distress. Continue progressive overload as per policy without signs/symptoms or physical distress. Continue progressive overload as per policy without signs/symptoms or physical distress.   Resistance Training   Training Prescription (read-only) Yes Yes Yes Yes Yes   Weight (read-only) 2 2 2 2 2    Reps (read-only) 10-15 10-15 10-15 10-15 10-15   Interval Training   Interval Training No No No No No   Treadmill   MPH (read-only) 1.5 1.5 1.5 1.7 1.7   Grade (read-only) 0 0 0 0 0   Minutes (read-only) 10 10 10 15 15    REL-XR   Level (read-only) 2 2 2 5 6    Watts (read-only) 40 40 40 60 60   Minutes (read-only) 15 15 15 15 15    Biostep-RELP   Level (read-only)    5 5   Watts (read-only)    45 45   Minutes (read-only)    15 15     12/26/15 0800 12/28/15 1027 01/28/16 1100       Exercise Review   Progression  Yes No  Absent since last review; last visit 12/28/15     Response to Exercise   Blood Pressure (Admit)  150/78 mmHg --     Blood Pressure (Exercise)  148/80 mmHg --     Blood Pressure (Exit)  118/70 mmHg --     Heart Rate (Admit)  88 bpm --      Heart Rate (Exercise)  86 bpm --     Heart Rate (Exit)  70 bpm --     Rating of Perceived Exertion (Exercise)  13 --     Symptoms None None None     Comments Reviewed individualized exercise prescription and made increases per departmental policy. Exercise increases were discussed with the patient and they were able to perform the new work loads without issue (no signs or symptoms).  [name] continues to progress despite ongoing health issues. [name] is going to try to do some activity at home on days he is not in class. He will start with one day a week of doing light activity at home. Ex. rx. will be re-evaluated upon return due to extended absence     Duration Progress to 50 minutes of aerobic without signs/symptoms of physical distress Progress to 50 minutes of aerobic without signs/symptoms of physical distress Progress to 50 minutes of aerobic without signs/symptoms of physical distress     Intensity Rest + 30 Rest + 30 Rest + 30     Progression   Progression Continue progressive overload as per policy without signs/symptoms or physical distress. Continue progressive overload as per policy without signs/symptoms or physical distress. Continue progressive overload as per policy without signs/symptoms or physical distress.     Resistance Training   Training Prescription (read-only) Yes Yes      Weight (read-only) 2 2      Reps (read-only) 10-15 10-15      Interval Training   Interval Training No No No     Treadmill   MPH (read-only) 1.7 1.7      Grade (read-only) 0 0      Minutes (read-only) 15 15      REL-XR   Level (read-only) 7 7  Watts (read-only) 60 60      Minutes (read-only) 20 20      Biostep-RELP   Level (read-only) 5 7      Watts (read-only) 45 40      Minutes (read-only) 15 15      Home Exercise Plan   Frequency  Add 1 additional day to program exercise sessions. --        Exercise Comments:     Exercise Comments      03/05/16 0651           Exercise Comments The  patient has not attended class since the last review. There has been no progress made due to lack of attendance. Workloads may need to be adjusted if the patient returns to class.           Discharge Exercise Prescription (Final Exercise Prescription Changes):     Exercise Prescription Changes - 01/28/16 1100    Exercise Review   Progression No  Absent since last review; last visit 12/28/15   Response to Exercise   Blood Pressure (Admit) --   Blood Pressure (Exercise) --   Blood Pressure (Exit) --   Heart Rate (Admit) --   Heart Rate (Exercise) --   Heart Rate (Exit) --   Rating of Perceived Exertion (Exercise) --   Symptoms None   Comments Ex. rx. will be re-evaluated upon return due to extended absence   Duration Progress to 50 minutes of aerobic without signs/symptoms of physical distress   Intensity Rest + 30   Progression   Progression Continue progressive overload as per policy without signs/symptoms or physical distress.   Interval Training   Interval Training No   Home Exercise Plan   Frequency --      Nutrition:  Target Goals: Understanding of nutrition guidelines, daily intake of sodium <1513m, cholesterol <2036m calories 30% from fat and 7% or less from saturated fats, daily to have 5 or more servings of fruits and vegetables.  Biometrics:    Nutrition Therapy Plan and Nutrition Goals:     Nutrition Therapy & Goals - 12/07/15 0931    Personal Nutrition Goals   Personal Goal #1 DoTimmothy Sourss still doing well with his heart healthy eating and has no concerns or questions      Nutrition Discharge: Rate Your Plate Scores:   Nutrition Goals Re-Evaluation:     Nutrition Goals Re-Evaluation      10/09/15 1333           Personal Goal #1 Re-Evaluation   Personal Goal #1 DoRoelalled usKoreand said he wants to return to Cardiac REhab in about a month. He is currently getting a cancer treatment today. [place].           Psychosocial: Target Goals: Acknowledge presence or absence of depression, maximize coping skills, provide positive support system. Participant is able to verbalize types and ability to use techniques and skills needed for reducing stress and depression.  Initial Review & Psychosocial Screening:   Quality of Life Scores:   PHQ-9:     Recent Review Flowsheet Data    [facility] 05/03/2015   Decreased Interest 0 0   Down, Depressed, Hopeless 0 0   PHQ - 2 Score 0 0   Altered sleeping 3 -   Tired, decreased energy 1 -   Change in appetite 0 -   Feeling bad or failure about yourself  0 -   Trouble concentrating  0 -   Moving slowly or fidgety/restless 1 -   Suicidal thoughts 0 -   PHQ-9 Score 5 -   Difficult doing work/chores Not difficult at all -      Psychosocial Evaluation and Intervention:     Psychosocial Evaluation - 12/12/15 1037    Psychosocial Evaluation & Interventions   Interventions Therapist referral;Encouraged to exercise with the program and follow exercise prescription;Stress management education   Comments Counselor met with Mr. Bugaj for initial psychosocical evaluation.  Mr. Mamie Nick has been attending this program for awhile, but is on a different schedule than counselor, so hard to connect.  Mr. Mamie Nick is a 75 year old who had his 4th stent inserted several months ago.  He has a strong support system with a spouse of 37 years, several adult children close by and active involvement in a local church community.  Mr. Mamie Nick has other health issues that are impacting his recovery as he is currently on Chemotherapy due to several cancer spots detected in his body.  Mr. Mamie Nick states he sleeps okay (a little better lately) and his appetite is "not good" due to the Chemo.  He denies a history of depression or anxiety, but does admit some current symptoms with sadness and tearfulness more lately.  Mr. Mamie Nick reported his mood is a "3" on a scale of 1-10, with "10"  being best.  Mr. Mamie Nick states his mood was better several weeks ago, until he learned about additional "spots" of cancer he was not aware of at the time.  He admits his health, trying to get the house sold, and finances are the biggest stressors for him currently.  He had goals for increased energy for this program and has already experienced that, with Mr. Mamie Nick reporting being able to walk faster and further lately.  He plans to join a gym to continue working out consistently upon completion of this program.  Counselor recommended Mr. Mamie Nick see a local therapist to address his mood and provide supportive services during this time with so many stressors and health issues impacting his functioning.  Contact information for the therapist was provided.     Continued Psychosocial Services Needed --  Counselor will follow up with recommendation for Mr. Mamie Nick to see a counselor, as well as participation in stress management  psychoeducational component of this program.        Psychosocial Re-Evaluation:     Psychosocial Re-Evaluation      10/09/15 1342 01/28/16 1300         Psychosocial Re-Evaluation   Comments Hendry called [place] and said he wants to return to Cardiac REhab in about a month. He is currently getting a cancer treatment today. [place] has a counselor for their patients also. Keelan was also reported he was in the hospital for Blood clot I called Elenore Rota and he and his wife are packing up their house since their house is up for sale. He is nauseaseated today. He is suppose to get another cancer treatment tomorrow. "It is too much for him to do Cardiac REhab now"         Vocational Rehabilitation: Provide vocational rehab assistance to qualifying candidates.   Vocational Rehab Evaluation & Intervention:   Education: Education Goals: Education classes will be provided on a weekly basis, covering required topics. Participant will state understanding/return demonstration of topics  presented.  Learning Barriers/Preferences:   Education Topics: General Nutrition Guidelines/Fats and Fiber: -Group instruction provided by verbal, written material, models  and posters to present the general guidelines for heart healthy nutrition. Gives an explanation and review of dietary fats and fiber.   Controlling Sodium/Reading Food Labels: -Group verbal and written material supporting the discussion of sodium use in heart healthy nutrition. Review and explanation with models, verbal and written materials for utilization of the food label.   Exercise Physiology & Risk Factors: - Group verbal and written instruction with models to review the exercise physiology of the cardiovascular system and associated critical values. Details cardiovascular disease risk factors and the goals associated with each risk factor.          Cardiac Rehab from 12/26/2015 in [facility] Cardiac and Pulmonary Rehab   Date  11/21/15   Educator  RM   Instruction Review Code  2- meets goals/outcomes      Aerobic Exercise & Resistance Training: - Gives group verbal and written discussion on the health impact of inactivity. On the components of aerobic and resistive training programs and the benefits of this training and how to safely progress through these programs.   Flexibility, Balance, General Exercise Guidelines: - Provides group verbal and written instruction on the benefits of flexibility and balance training programs. Provides general exercise guidelines with specific guidelines to those with heart or lung disease. Demonstration and skill practice provided.      Cardiac Rehab from 12/26/2015 in [facility] Cardiac and Pulmonary Rehab   Date  08/06/15   Educator  [facility]   Instruction Review Code  2- meets goals/outcomes      Stress Management: - Provides group verbal and written instruction about the health risks of elevated stress, cause of high stress, and healthy ways to reduce stress.   Depression: -  Provides group verbal and written instruction on the correlation between heart/lung disease and depressed mood, treatment options, and the stigmas associated with seeking treatment.   Anatomy & Physiology of the Heart: - Group verbal and written instruction and models provide basic cardiac anatomy and physiology, with the coronary electrical and arterial systems. Review of: AMI, Angina, Valve disease, Heart Failure, Cardiac Arrhythmia, Pacemakers, and the ICD.   Cardiac Procedures: - Group verbal and written instruction and models to describe the testing methods done to diagnose heart disease. Reviews the outcomes of the test results. Describes the treatment choices: Medical Management, Angioplasty, or Coronary Bypass Surgery.   Cardiac Medications: - Group verbal and written instruction to review commonly prescribed medications for heart disease. Reviews the medication, class of the drug, and side effects. Includes the steps to properly store meds and maintain the prescription regimen.      Cardiac Rehab from 12/26/2015 in [facility] Cardiac and Pulmonary Rehab   Date  12/26/15   Educator  SB   Instruction Review Code  2- meets goals/outcomes      Go Sex-Intimacy & Heart Disease, Get SMART - Goal Setting: - Group verbal and written instruction through game format to discuss heart disease and the return to sexual intimacy. Provides group verbal and written material to discuss and apply goal setting through the application of the S.M.A.R.T. Method.   Other Matters of the Heart: - Provides group verbal, written materials and models to describe Heart Failure, Angina, Valve Disease, and Diabetes in the realm of heart disease. Includes description of the disease process and treatment options available to the cardiac patient.   Exercise & Equipment Safety: - Individual verbal instruction and demonstration of equipment use and safety with use of the equipment.      Cardiac  Rehab from 12/26/2015 in [facility]  Cardiac and Pulmonary Rehab   Date  08/06/15   Educator  RM   Instruction Review Code  2- meets goals/outcomes      Infection Prevention: - Provides verbal and written material to individual with discussion of infection control including proper hand washing and proper equipment cleaning during exercise session.      Cardiac Rehab from 12/26/2015 in [facility] Cardiac and Pulmonary Rehab   Date  08/06/15   Educator  RM   Instruction Review Code  2- meets goals/outcomes      Falls Prevention: - Provides verbal and written material to individual with discussion of falls prevention and safety.   Diabetes: - Individual verbal and written instruction to review signs/symptoms of diabetes, desired ranges of glucose level fasting, after meals and with exercise. Advice that pre and post exercise glucose checks will be done for 3 sessions at entry of program.      Cardiac Rehab from 12/26/2015 in [facility] Cardiac and Pulmonary Rehab   Date  07/31/15   Educator  SB   Instruction Review Code  2- meets goals/outcomes       Knowledge Questionnaire Score:   Core Components/Risk Factors/Patient Goals at Admission:   Core Components/Risk Factors/Patient Goals Review:      Goals and Risk Factor Review      10/09/15 1340 12/04/15 0737 12/07/15 0931 12/31/15 1035 01/28/16 1301   Core Components/Risk Factors/Patient Goals Review   Personal Goals Review   Hypertension;Diabetes;Lipids  Diabetes;Hypertension;Lipids   Review     I just called and Johnson was lying down resting since his is nauseated from his cancer treatments she said. She said it is hard for him to eat with the nausea. Deone's wife said she knows he hopes to get back exercising in Cardiac Rehab in a month or so.    Expected Outcomes     Long term to be able to exercise in Cardiac Rehab to help his HTN, Diabetes and Lipids.    Increase Aerobic Exercise and Physical Activity (read-only)   Goals Progress/Improvement seen  No Yes  Yes     Comments Zenas called [place] and said he wants to return to Cardiac REhab in about a month. He is currently getting a cancer treatment today. [place] has nutritionist. Blayn was also reported he was in the hospital for Blood clots. [name] is progressing well ,especially considering he was out for several months. The cancer and treatments were affecting his exercise and he is feeling much better now. Until he has more consistent attendance, we will hold off on interval training and adding exercise at home. We will follow up with him in February on how he is feeling with the exercise and add new goals at that time.   [name] continues to progress despite ongoing health issues, his attendance has been more consistent which has helped with better exercise progression and him feeling more confident with exercise workloads. [name] is going to try to do some activity at home on days he is not in class. He will start with one day a week of doing light activity at home. We will follow up with him in the coming weeks on how that home exercise is going.     Diabetes (read-only)   Goal --  See [place] lab work. Has been out since Sept from Cardiac Rehab.        Progress seen towards goals   No     Comments  No change     Hypertension (read-only)   Goal --  Jeffie called [place] and said he wants to return to Cardiac REhab in about a month. He is currently getting a cancer treatment today. [place] has nutritionist. Araf was also reported he was in the hospital for Blood clot       Progress seen toward goals   No     Comments   No change, BP medication is doing its job  and BP readings in class are in appropriate range     Abnormal Lipids (read-only)   Goal --  Braxden called [place] and said he wants to return to Cardiac REhab in about a month. He is currently getting a cancer treatment today. [place] has nutritionist. [place] was also reported he was in the hospital for Blood clot       Progress  seen towards goals   No     Comments   Lipids have not been re-evaluated        Core Components/Risk Factors/Patient Goals at Discharge (Final Review):      Goals and Risk Factor Review - 01/28/16 1301    Core Components/Risk Factors/Patient Goals Review   Personal Goals Review Diabetes;Hypertension;Lipids   Review I just called and Amron was lying down resting since his is nauseated from his cancer treatments she said. She said it is hard for him to eat with the nausea. Shourya's wife said she knows he hopes to get back exercising in Cardiac Rehab in a month or so.    Expected Outcomes Long term to be able to exercise in Cardiac Rehab to help his HTN, Diabetes and Lipids.       ITP Comments:     ITP Comments      10/22/15 1216 11/18/15 1225 11/23/15 0946 12/13/15 1134 01/08/16 0804   ITP Comments 30 day review preparation: Continue with ITP Ready for 30 day review.  Continue with ITP  Has only attended 4 sessions. Last on 08/06/2015. Has been out with medical concerns. Plans to return Jan 4.  [name] did well after first day back on Wed. He was tired today, so was advised to slow down and take rest breaks as needed.  Ready for 30 day review. Continue with ITP. 30 day review.  Continue with ITP.  [name] has been unable to attend some sessions secondary to the side effects of the chemotherapy he is  currently receiving. He attends sessions as often as he can.     01/28/16 1259 02/07/16 1258 03/09/16 0929       ITP Comments I called Elenore Rota and he and his wife are packing up their house since their house is up for sale. He is nauseaseated today. He is suppose to get another cancer treatment tomorrow.  30 Day Review. Continue with the ITP.  Remains out secondary to Chemo symptoms. 30 day review.  Continue with ITP  Remains out for medical reasons        Comments:

## 2016-03-11 ENCOUNTER — Inpatient Hospital Stay (HOSPITAL_BASED_OUTPATIENT_CLINIC_OR_DEPARTMENT_OTHER): Payer: PPO | Admitting: Oncology

## 2016-03-11 ENCOUNTER — Inpatient Hospital Stay: Payer: PPO

## 2016-03-11 ENCOUNTER — Other Ambulatory Visit: Payer: Self-pay | Admitting: *Deleted

## 2016-03-11 DIAGNOSIS — C7910 Secondary malignant neoplasm of unspecified urinary organs: Secondary | ICD-10-CM

## 2016-03-11 DIAGNOSIS — F419 Anxiety disorder, unspecified: Secondary | ICD-10-CM

## 2016-03-11 DIAGNOSIS — Z5112 Encounter for antineoplastic immunotherapy: Secondary | ICD-10-CM | POA: Diagnosis not present

## 2016-03-11 DIAGNOSIS — C787 Secondary malignant neoplasm of liver and intrahepatic bile duct: Secondary | ICD-10-CM | POA: Diagnosis not present

## 2016-03-11 DIAGNOSIS — E1165 Type 2 diabetes mellitus with hyperglycemia: Secondary | ICD-10-CM | POA: Diagnosis not present

## 2016-03-11 DIAGNOSIS — M129 Arthropathy, unspecified: Secondary | ICD-10-CM

## 2016-03-11 DIAGNOSIS — R112 Nausea with vomiting, unspecified: Secondary | ICD-10-CM

## 2016-03-11 DIAGNOSIS — I2510 Atherosclerotic heart disease of native coronary artery without angina pectoris: Secondary | ICD-10-CM

## 2016-03-11 DIAGNOSIS — K5900 Constipation, unspecified: Secondary | ICD-10-CM

## 2016-03-11 DIAGNOSIS — C689 Malignant neoplasm of urinary organ, unspecified: Secondary | ICD-10-CM

## 2016-03-11 DIAGNOSIS — R531 Weakness: Secondary | ICD-10-CM

## 2016-03-11 DIAGNOSIS — D649 Anemia, unspecified: Secondary | ICD-10-CM

## 2016-03-11 DIAGNOSIS — I252 Old myocardial infarction: Secondary | ICD-10-CM

## 2016-03-11 DIAGNOSIS — Z7982 Long term (current) use of aspirin: Secondary | ICD-10-CM

## 2016-03-11 DIAGNOSIS — Z923 Personal history of irradiation: Secondary | ICD-10-CM

## 2016-03-11 DIAGNOSIS — Z7984 Long term (current) use of oral hypoglycemic drugs: Secondary | ICD-10-CM

## 2016-03-11 DIAGNOSIS — E785 Hyperlipidemia, unspecified: Secondary | ICD-10-CM

## 2016-03-11 DIAGNOSIS — G4730 Sleep apnea, unspecified: Secondary | ICD-10-CM

## 2016-03-11 DIAGNOSIS — Z7901 Long term (current) use of anticoagulants: Secondary | ICD-10-CM

## 2016-03-11 DIAGNOSIS — R918 Other nonspecific abnormal finding of lung field: Secondary | ICD-10-CM

## 2016-03-11 DIAGNOSIS — I10 Essential (primary) hypertension: Secondary | ICD-10-CM

## 2016-03-11 DIAGNOSIS — C679 Malignant neoplasm of bladder, unspecified: Secondary | ICD-10-CM

## 2016-03-11 DIAGNOSIS — R296 Repeated falls: Secondary | ICD-10-CM

## 2016-03-11 DIAGNOSIS — Z79899 Other long term (current) drug therapy: Secondary | ICD-10-CM

## 2016-03-11 DIAGNOSIS — Z8546 Personal history of malignant neoplasm of prostate: Secondary | ICD-10-CM

## 2016-03-11 DIAGNOSIS — R5383 Other fatigue: Secondary | ICD-10-CM

## 2016-03-11 DIAGNOSIS — E669 Obesity, unspecified: Secondary | ICD-10-CM

## 2016-03-11 DIAGNOSIS — Z9221 Personal history of antineoplastic chemotherapy: Secondary | ICD-10-CM

## 2016-03-11 LAB — COMPREHENSIVE METABOLIC PANEL
ALT: 10 U/L — ABNORMAL LOW (ref 17–63)
AST: 21 U/L (ref 15–41)
Albumin: 3.6 g/dL (ref 3.5–5.0)
Alkaline Phosphatase: 130 U/L — ABNORMAL HIGH (ref 38–126)
Anion gap: 6 (ref 5–15)
BILIRUBIN TOTAL: 0.5 mg/dL (ref 0.3–1.2)
BUN: 42 mg/dL — ABNORMAL HIGH (ref 6–20)
CHLORIDE: 95 mmol/L — AB (ref 101–111)
CO2: 29 mmol/L (ref 22–32)
Calcium: 9.5 mg/dL (ref 8.9–10.3)
Creatinine, Ser: 2.16 mg/dL — ABNORMAL HIGH (ref 0.61–1.24)
GFR, EST AFRICAN AMERICAN: 33 mL/min — AB (ref >60)
GFR, EST NON AFRICAN AMERICAN: 29 mL/min — AB (ref >60)
GLUCOSE: 137 mg/dL — AB (ref 65–99)
POTASSIUM: 5.1 mmol/L (ref 3.5–5.1)
Sodium: 130 mmol/L — ABNORMAL LOW (ref 135–145)
Total Protein: 7.1 g/dL (ref 6.5–8.1)

## 2016-03-11 LAB — CBC WITH DIFFERENTIAL/PLATELET
BASOS ABS: 0.1 10*3/uL (ref 0–0.1)
Basophils Relative: 1 %
EOS PCT: 5 %
Eosinophils Absolute: 0.6 10*3/uL (ref 0–0.7)
HEMATOCRIT: 33.5 % — AB (ref 40.0–52.0)
Hemoglobin: 11.3 g/dL — ABNORMAL LOW (ref 13.0–18.0)
Lymphocytes Relative: 22 %
Lymphs Abs: 2.3 10*3/uL (ref 1.0–3.6)
MCH: 28.8 pg (ref 26.0–34.0)
MCHC: 33.7 g/dL (ref 32.0–36.0)
MCV: 85.5 fL (ref 80.0–100.0)
MONO ABS: 1.3 10*3/uL — AB (ref 0.2–1.0)
MONOS PCT: 12 %
NEUTROS ABS: 6.3 10*3/uL (ref 1.4–6.5)
Neutrophils Relative %: 60 %
PLATELETS: 331 10*3/uL (ref 150–440)
RBC: 3.92 MIL/uL — ABNORMAL LOW (ref 4.40–5.90)
RDW: 15.9 % — AB (ref 11.5–14.5)
WBC: 10.4 10*3/uL (ref 3.8–10.6)

## 2016-03-11 MED ORDER — PREDNISONE 10 MG (21) PO TBPK: 10.0000 mg | ORAL_TABLET | Freq: Every day | ORAL | Status: DC

## 2016-03-11 MED ORDER — SODIUM CHLORIDE 0.9% FLUSH
10.0000 mL | Freq: Once | INTRAVENOUS | Status: AC
  Administered 2016-03-11: 10 mL via INTRAVENOUS
  Filled 2016-03-11: qty 10

## 2016-03-11 MED ORDER — SODIUM CHLORIDE 0.9 % IV SOLN
20.0000 mg | Freq: Once | INTRAVENOUS | Status: AC
  Administered 2016-03-11: 20 mg via INTRAVENOUS
  Filled 2016-03-11: qty 2

## 2016-03-11 MED ORDER — HEPARIN SOD (PORK) LOCK FLUSH 100 UNIT/ML IV SOLN
500.0000 [IU] | Freq: Once | INTRAVENOUS | Status: AC
  Administered 2016-03-11: 500 [IU] via INTRAVENOUS
  Filled 2016-03-11: qty 5

## 2016-03-11 MED ORDER — SODIUM CHLORIDE 0.9 % IV SOLN
Freq: Once | INTRAVENOUS | Status: AC
  Administered 2016-03-11: 10:00:00 via INTRAVENOUS
  Filled 2016-03-11: qty 1000

## 2016-03-13 NOTE — Progress Notes (Signed)
[place]  Telephone:(336) [phone] Fax:(336) [phone]  ID: Louis Alexander OB: Dec 03, 1940  MR#: [id number]  [id number]  Patient Care Team: Birdie Sons, MD as PCP - General (Family Medicine) Murrell Redden, MD (Urology) Dionisio David, MD as Consulting Physician (Cardiology) Lloyd Huger, MD as Consulting Physician (Oncology)  CHIEF COMPLAINT:  No chief complaint on file.   INTERVAL HISTORY: Patient returns to clinic for further evaluation and consideration of cycle 4 of nivolumab. He continues to have increased weakness and fatigue. Patient also states he had another fall this morning. He has a poor appetite and decreased PO intake. His pain is currently well-controlled with the fentanyl patch.  He is highly anxious.  He does not complain of chest pain or shortness of breath. He has no neurologic complaints. Patient denies any fevers. He denies any diarrhea but he has had occasional constipation but it is controlled with miralax.  He offers no further specific complaints today.  REVIEW OF SYSTEMS:   Review of Systems  Constitutional: Positive for weight loss and malaise/fatigue. Negative for fever.  Respiratory: Negative.  Negative for shortness of breath.   Cardiovascular: Negative.  Negative for chest pain.  Gastrointestinal: Positive for nausea, vomiting and constipation. Negative for abdominal pain.  Genitourinary: Negative.   Musculoskeletal: Negative.   Neurological: Positive for weakness.  Psychiatric/Behavioral: The patient is nervous/anxious.     As per HPI. Otherwise, a complete review of systems is negatve.  PAST MEDICAL HISTORY: Past Medical History  Diagnosis Date  . Abnormal EKG   . Coronary artery disease   . Diabetes mellitus without complication ([place])   . Obesity   . Hyperlipidemia   . Hypertension   . Sleep apnea     could not tolerate cpap mask  . Arthritis     oa  . Cancer [facility])     bladder and prostate  . History  of radiation therapy 2012  . History of chemotherapy 2014  . Bladder cancer ([place])   . History of chicken pox   . Myocardial infarction ([place]) 2008 and 2012    x 2    PAST SURGICAL HISTORY: Past Surgical History  Procedure Laterality Date  . Cardiac catheterization  12-06-2003  . Lad stent  2000 x1 stent, 2008 x 1 stent, 2012 x 1 stent  . Hernia repair  yrs ago  . Appendectomy  many yrs ago  . Rotator cuff repair Left yrs ago  . Seed implant to prostate with radiation  2012  . Protatectomy and urostomy  2014  . Total hip arthroplasty Right 12/27/2013    Procedure: RIGHT TOTAL HIP ARTHROPLASTY ANTERIOR APPROACH;  Surgeon: Mauri Pole, MD;  Location: [facility];  Service: Orthopedics;  Laterality: Right;  . Cardiac catheterization N/A 06/04/2015    Procedure: Left Heart Cath and Coronary Angiography;  Surgeon: Corey Skains, MD;  Location: [place] CV [facility];  Service: Cardiovascular;  Laterality: N/A;  . Cardiac catheterization N/A 06/04/2015    Procedure: Coronary Stent Intervention;  Surgeon: Wellington Hampshire, MD;  Location: [place] [facility] LAB;  Service: Cardiovascular;  Laterality: N/A;  . Peripheral vascular catheterization N/A 09/25/2015    Procedure: IVC Filter Insertion;  Surgeon: Katha Cabal, MD;  Location: [place] [facility] LAB;  Service: Cardiovascular;  Laterality: N/A;    FAMILY HISTORY: Reviewed and unchanged. No report of malignancy or chronic disease.     ADVANCED DIRECTIVES:    HEALTH MAINTENANCE: Social History  Substance Use  Topics  . Smoking status: Never Smoker   . Smokeless tobacco: Never Used  . Alcohol Use: 0.0 oz/week    0 Standard drinks or equivalent per week     Comment: occasional     Allergies  Allergen Reactions  . Celebrex [Celecoxib]     Hematuria Other reaction(s): Bleeding Bleeding in bladder  . Effient [Prasugrel] Other (See Comments)    blood clots, blood in urine Other reaction(s): Bleeding Bleeding in bladder    Current  Outpatient Prescriptions  Medication Sig Dispense Refill  . ALPRAZolam (XANAX) 0.25 MG tablet Take 1 tablet (0.25 mg total) by mouth 2 (two) times daily as needed for anxiety. 30 tablet 0  . apixaban (ELIQUIS) 5 MG TABS tablet Take 5 mg by mouth 2 (two) times daily.    [name] aspirin EC 81 MG tablet Take 81 mg by mouth daily.    [name] atenolol (TENORMIN) 25 MG tablet Take 25 mg by mouth daily.    . Coenzyme Q10 10 MG capsule Take 10 mg by mouth.    . fentaNYL (DURAGESIC - DOSED MCG/HR) 100 MCG/HR Place 1 patch (100 mcg total) onto the skin every 3 (three) days. 10 patch 0  . Fluticasone Furoate-Vilanterol (BREO ELLIPTA) 100-25 MCG/INH AEPB Inhale 1 Inhaler into the lungs daily.    . furosemide (LASIX) 20 MG tablet Take 20 mg by mouth 2 (two) times daily.    [name] glipiZIDE (GLUCOTROL) 10 MG tablet TAKE 1 TABLET(S) BY MOUTH DAILY 30 tablet 12  . glucose blood (ONE TOUCH ULTRA TEST) test strip Check blood sugar daily. Dx: E11.9 100 each 3  . metFORMIN (GLUCOPHAGE-XR) 500 MG 24 hr tablet 2 (TWO) TABLET, ORAL, TWO TIMES DAILY 120 tablet 11  . Multiple Vitamin (MULTIVITAMIN WITH MINERALS) TABS tablet Take 1 tablet by mouth daily.    . ondansetron (ZOFRAN) 8 MG tablet Take 1 tablet (8 mg total) by mouth every 8 (eight) hours as needed for nausea or vomiting. 60 tablet 2  . pantoprazole (PROTONIX) 40 MG tablet Take 40 mg by mouth 2 (two) times daily.    . posaconazole (NOXAFIL) 40 MG/ML suspension Take 10 mLs (400 mg total) by mouth 2 (two) times daily. (Patient not taking: Reported on 02/26/2016) 237 mL 2  . potassium chloride (K-DUR) 10 MEQ tablet Take 10 mEq by mouth daily.    . prasugrel (EFFIENT) 10 MG TABS tablet Take 10 mg by mouth daily. Reported on 12/03/2015    . predniSONE (STERAPRED UNI-PAK 21 TAB) 10 MG (21) TBPK tablet Take 1 tablet (10 mg total) by mouth daily. Taper as directed. Day 1-60 mg; Day 2-50 mg; Day 3-40 mg; Day 4-30 mg. Day 5-20 mg. Day 6-10 mg. 21 tablet 0  . prochlorperazine (COMPAZINE) 10  MG tablet TAKE 1 TABLET BY MOUTH EVERY 6 HOURS AS NEEDED 40 tablet 2  . sotalol (BETAPACE) 80 MG tablet Take by mouth 2 (two) times daily.     No current facility-administered medications for this visit.   Facility-Administered Medications Ordered in Other Visits  Medication Dose Route Frequency Provider Last Rate Last Dose  . chlorhexidine (HIBICLENS) 4 % liquid 4 application  60 mL Topical Once Danae Orleans, PA-C       And  . chlorhexidine (HIBICLENS) 4 % liquid 4 application  60 mL Topical Once Danae Orleans, PA-C      . sodium chloride flush (NS) 0.9 % injection 10 mL  10 mL Intravenous PRN Lloyd Huger, MD   10 mL  at 12/25/15 0915    OBJECTIVE: There were no vitals filed for this visit.   There is no weight on file to calculate BMI.    ECOG FS:2 - Symptomatic, <50% confined to bed  General: Well-developed, well-nourished, no acute distress. Eyes: anicteric sclera. Lungs: Clear to auscultation bilaterally. Heart: Regular rate and rhythm. No rubs, murmurs, or gallops. Abdomen: Soft, nontender, nondistended. No organomegaly noted, normoactive bowel sounds. Musculoskeletal: No edema, cyanosis, or clubbing. Neuro: Alert, answering all questions appropriately. Cranial nerves grossly intact. Skin: No rashes or petechiae noted. Psych: Normal affect.   LAB RESULTS:  Lab Results  Component Value Date   NA 130* 03/11/2016   K 5.1 03/11/2016   CL 95* 03/11/2016   CO2 29 03/11/2016   GLUCOSE 137* 03/11/2016   BUN 42* 03/11/2016   CREATININE 2.16* 03/11/2016   CALCIUM 9.5 03/11/2016   PROT 7.1 03/11/2016   ALBUMIN 3.6 03/11/2016   AST 21 03/11/2016   ALT 10* 03/11/2016   ALKPHOS 130* 03/11/2016   BILITOT 0.5 03/11/2016   GFRNONAA 29* 03/11/2016   GFRAA 33* 03/11/2016    Lab Results  Component Value Date   WBC 10.4 03/11/2016   NEUTROABS 6.3 03/11/2016   HGB 11.3* 03/11/2016   HCT 33.5* 03/11/2016   MCV 85.5 03/11/2016   PLT 331 03/11/2016     STUDIES: Ct  Head Wo Contrast  02/17/2016  CLINICAL DATA:  Fall today while helping wife with groceries hitting head and face on deck. Patient is on blood thinner medication. EXAM: CT HEAD WITHOUT CONTRAST CT MAXILLOFACIAL WITHOUT CONTRAST TECHNIQUE: Multidetector CT imaging of the head and maxillofacial structures were performed using the standard protocol without intravenous contrast. Multiplanar CT image reconstructions of the maxillofacial structures were also generated. COMPARISON:  None. FINDINGS: CT HEAD FINDINGS Ventricles, cisterns and other CSF spaces are within normal. There is no mass, mass effect, shift of midline structures or acute hemorrhage. No evidence of acute infarction. Bones and soft tissues are within normal. CT MAXILLOFACIAL FINDINGS Orbits are normal and symmetric. Paranasal sinuses well developed and well aerated without air-fluid levels. There is no evidence of facial bone fracture. Remaining soft tissues are within normal. Minimal degenerative change of the spine. IMPRESSION: No acute intracranial findings. No acute facial bone fracture. Electronically Signed   By: Marin Olp M.D.   On: 02/17/2016 17:31   Ct Maxillofacial Wo Cm  02/17/2016  CLINICAL DATA:  Fall today while helping wife with groceries hitting head and face on deck. Patient is on blood thinner medication. EXAM: CT HEAD WITHOUT CONTRAST CT MAXILLOFACIAL WITHOUT CONTRAST TECHNIQUE: Multidetector CT imaging of the head and maxillofacial structures were performed using the standard protocol without intravenous contrast. Multiplanar CT image reconstructions of the maxillofacial structures were also generated. COMPARISON:  None. FINDINGS: CT HEAD FINDINGS Ventricles, cisterns and other CSF spaces are within normal. There is no mass, mass effect, shift of midline structures or acute hemorrhage. No evidence of acute infarction. Bones and soft tissues are within normal. CT MAXILLOFACIAL FINDINGS Orbits are normal and symmetric. Paranasal  sinuses well developed and well aerated without air-fluid levels. There is no evidence of facial bone fracture. Remaining soft tissues are within normal. Minimal degenerative change of the spine. IMPRESSION: No acute intracranial findings. No acute facial bone fracture. Electronically Signed   By: Marin Olp M.D.   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  PLAN:    1.  Urothelial carcinoma:  PET scan results from December 20, 2015 reported as above with at least stable disease. Given patient's persistent symptoms and declining performance status, cisplatin and gemcitabine was discontinued. Delay cycle 4 of nivolumab 240 mg every 2 weeks secondary to declining performance status and increasing creatinine. Return to clinic in 1 week for reconsideration of cycle 4.  If patient progresses can consider treatment with atezolizumab. 2.  Lung nodule: Pathology result reviewed independently with hyphae noted on sample.  Treatment per Dr. Ola Spurr in infectious disease.  3. Pain: Improved. Likely secondary to malignancy. Patient previously had XRT to his pelvic area. Fentanyl patch was increased to 100 mcg Q 72 hours.  4. Hyperglycemia: Patient is diabetic. Continue glipizide and metformin as prescribed. Invokana was been discontinued by primary care. Monitor. 5. Anemia: Mild, monitor. 6. Atrial fibrillation: Patient's heart is in regular rhythm today. Continue treatment and follow up with cardiology. 7. Nausea/Vomiting: Continue treatment as above. 8. Creatinine:  Slightly worse today. Hold Opdivo as above and patient was given a Medrol Dosepak. 9. Constipation: Miralax PRN.  Patient expressed understanding and was in agreement with this plan. He also understands that He can call clinic at any time with any questions, concerns, or complaints.   Urothelial cancer   Staging form: Kidney, AJCC 7th Edition     Clinical stage from 03/16/2015: Stage  IV (TX, N1, M1) - Signed by Lloyd Huger, MD on 03/16/2015  Lloyd Huger, MD   03/13/2016 2:40 PM

## 2016-03-14 ENCOUNTER — Other Ambulatory Visit: Payer: Self-pay | Admitting: Family Medicine

## 2016-03-18 ENCOUNTER — Inpatient Hospital Stay: Payer: PPO

## 2016-03-18 ENCOUNTER — Inpatient Hospital Stay (HOSPITAL_BASED_OUTPATIENT_CLINIC_OR_DEPARTMENT_OTHER): Payer: PPO | Admitting: Oncology

## 2016-03-18 ENCOUNTER — Inpatient Hospital Stay: Payer: PPO | Attending: Oncology

## 2016-03-18 ENCOUNTER — Telehealth: Payer: Self-pay | Admitting: *Deleted

## 2016-03-18 VITALS — BP 144/77 | HR 52 | Temp 96.8°F | Resp 14 | Wt 173.9 lb

## 2016-03-18 DIAGNOSIS — Z79899 Other long term (current) drug therapy: Secondary | ICD-10-CM | POA: Diagnosis not present

## 2016-03-18 DIAGNOSIS — Z9221 Personal history of antineoplastic chemotherapy: Secondary | ICD-10-CM | POA: Diagnosis not present

## 2016-03-18 DIAGNOSIS — R918 Other nonspecific abnormal finding of lung field: Secondary | ICD-10-CM

## 2016-03-18 DIAGNOSIS — D649 Anemia, unspecified: Secondary | ICD-10-CM

## 2016-03-18 DIAGNOSIS — E1165 Type 2 diabetes mellitus with hyperglycemia: Secondary | ICD-10-CM | POA: Insufficient documentation

## 2016-03-18 DIAGNOSIS — Z8551 Personal history of malignant neoplasm of bladder: Secondary | ICD-10-CM | POA: Insufficient documentation

## 2016-03-18 DIAGNOSIS — I252 Old myocardial infarction: Secondary | ICD-10-CM | POA: Diagnosis not present

## 2016-03-18 DIAGNOSIS — F419 Anxiety disorder, unspecified: Secondary | ICD-10-CM

## 2016-03-18 DIAGNOSIS — Z7901 Long term (current) use of anticoagulants: Secondary | ICD-10-CM | POA: Diagnosis not present

## 2016-03-18 DIAGNOSIS — Z8546 Personal history of malignant neoplasm of prostate: Secondary | ICD-10-CM

## 2016-03-18 DIAGNOSIS — K5900 Constipation, unspecified: Secondary | ICD-10-CM

## 2016-03-18 DIAGNOSIS — Z7984 Long term (current) use of oral hypoglycemic drugs: Secondary | ICD-10-CM | POA: Diagnosis not present

## 2016-03-18 DIAGNOSIS — Z7982 Long term (current) use of aspirin: Secondary | ICD-10-CM | POA: Diagnosis not present

## 2016-03-18 DIAGNOSIS — C787 Secondary malignant neoplasm of liver and intrahepatic bile duct: Secondary | ICD-10-CM | POA: Insufficient documentation

## 2016-03-18 DIAGNOSIS — I2510 Atherosclerotic heart disease of native coronary artery without angina pectoris: Secondary | ICD-10-CM

## 2016-03-18 DIAGNOSIS — R634 Abnormal weight loss: Secondary | ICD-10-CM | POA: Insufficient documentation

## 2016-03-18 DIAGNOSIS — I10 Essential (primary) hypertension: Secondary | ICD-10-CM | POA: Insufficient documentation

## 2016-03-18 DIAGNOSIS — R944 Abnormal results of kidney function studies: Secondary | ICD-10-CM | POA: Diagnosis not present

## 2016-03-18 DIAGNOSIS — Z5112 Encounter for antineoplastic immunotherapy: Secondary | ICD-10-CM | POA: Insufficient documentation

## 2016-03-18 DIAGNOSIS — R531 Weakness: Secondary | ICD-10-CM

## 2016-03-18 DIAGNOSIS — C679 Malignant neoplasm of bladder, unspecified: Secondary | ICD-10-CM | POA: Diagnosis not present

## 2016-03-18 DIAGNOSIS — R630 Anorexia: Secondary | ICD-10-CM | POA: Diagnosis not present

## 2016-03-18 DIAGNOSIS — G4730 Sleep apnea, unspecified: Secondary | ICD-10-CM | POA: Diagnosis not present

## 2016-03-18 DIAGNOSIS — R5383 Other fatigue: Secondary | ICD-10-CM

## 2016-03-18 DIAGNOSIS — I4891 Unspecified atrial fibrillation: Secondary | ICD-10-CM | POA: Insufficient documentation

## 2016-03-18 DIAGNOSIS — E785 Hyperlipidemia, unspecified: Secondary | ICD-10-CM

## 2016-03-18 DIAGNOSIS — E669 Obesity, unspecified: Secondary | ICD-10-CM | POA: Insufficient documentation

## 2016-03-18 DIAGNOSIS — M129 Arthropathy, unspecified: Secondary | ICD-10-CM

## 2016-03-18 DIAGNOSIS — C689 Malignant neoplasm of urinary organ, unspecified: Secondary | ICD-10-CM

## 2016-03-18 DIAGNOSIS — R112 Nausea with vomiting, unspecified: Secondary | ICD-10-CM | POA: Insufficient documentation

## 2016-03-18 DIAGNOSIS — Z923 Personal history of irradiation: Secondary | ICD-10-CM | POA: Diagnosis not present

## 2016-03-18 LAB — CBC WITH DIFFERENTIAL/PLATELET
BASOS PCT: 0 %
Basophils Absolute: 0 10*3/uL (ref 0–0.1)
EOS ABS: 0 10*3/uL (ref 0–0.7)
EOS PCT: 0 %
HCT: 30.2 % — ABNORMAL LOW (ref 40.0–52.0)
Hemoglobin: 10.2 g/dL — ABNORMAL LOW (ref 13.0–18.0)
LYMPHS ABS: 1.9 10*3/uL (ref 1.0–3.6)
Lymphocytes Relative: 14 %
MCH: 29 pg (ref 26.0–34.0)
MCHC: 33.7 g/dL (ref 32.0–36.0)
MCV: 86 fL (ref 80.0–100.0)
MONO ABS: 0.7 10*3/uL (ref 0.2–1.0)
MONOS PCT: 5 %
Neutro Abs: 10.8 10*3/uL — ABNORMAL HIGH (ref 1.4–6.5)
Neutrophils Relative %: 81 %
PLATELETS: 315 10*3/uL (ref 150–440)
RBC: 3.51 MIL/uL — ABNORMAL LOW (ref 4.40–5.90)
RDW: 16.1 % — AB (ref 11.5–14.5)
WBC: 13.4 10*3/uL — ABNORMAL HIGH (ref 3.8–10.6)

## 2016-03-18 LAB — COMPREHENSIVE METABOLIC PANEL
ALT: 12 U/L — ABNORMAL LOW (ref 17–63)
ANION GAP: 11 (ref 5–15)
AST: 22 U/L (ref 15–41)
Albumin: 3.3 g/dL — ABNORMAL LOW (ref 3.5–5.0)
Alkaline Phosphatase: 98 U/L (ref 38–126)
BUN: 45 mg/dL — AB (ref 6–20)
CHLORIDE: 95 mmol/L — AB (ref 101–111)
CO2: 23 mmol/L (ref 22–32)
Calcium: 9.5 mg/dL (ref 8.9–10.3)
Creatinine, Ser: 1.81 mg/dL — ABNORMAL HIGH (ref 0.61–1.24)
GFR calc Af Amer: 41 mL/min — ABNORMAL LOW (ref >60)
GFR, EST NON AFRICAN AMERICAN: 35 mL/min — AB (ref >60)
Glucose, Bld: 248 mg/dL — ABNORMAL HIGH (ref 65–99)
Potassium: 5.1 mmol/L (ref 3.5–5.1)
Sodium: 129 mmol/L — ABNORMAL LOW (ref 135–145)
Total Bilirubin: 0.2 mg/dL — ABNORMAL LOW (ref 0.3–1.2)
Total Protein: 6.4 g/dL — ABNORMAL LOW (ref 6.5–8.1)

## 2016-03-18 MED ORDER — SODIUM CHLORIDE 0.9% FLUSH
10.0000 mL | INTRAVENOUS | Status: DC | PRN
  Administered 2016-03-18: 10 mL
  Filled 2016-03-18: qty 10

## 2016-03-18 MED ORDER — SODIUM CHLORIDE 0.9 % IV SOLN
Freq: Once | INTRAVENOUS | Status: AC
  Administered 2016-03-18: 13:00:00 via INTRAVENOUS
  Filled 2016-03-18: qty 1000

## 2016-03-18 MED ORDER — HEPARIN SOD (PORK) LOCK FLUSH 100 UNIT/ML IV SOLN
500.0000 [IU] | Freq: Once | INTRAVENOUS | Status: AC | PRN
  Administered 2016-03-18: 500 [IU]
  Filled 2016-03-18: qty 5

## 2016-03-18 MED ORDER — SODIUM CHLORIDE 0.9 % IV SOLN
Freq: Once | INTRAVENOUS | Status: AC
  Administered 2016-03-18: 12:00:00 via INTRAVENOUS
  Filled 2016-03-18: qty 1000

## 2016-03-18 MED ORDER — SODIUM CHLORIDE 0.9 % IV SOLN
240.0000 mg | Freq: Once | INTRAVENOUS | Status: AC
  Administered 2016-03-18: 240 mg via INTRAVENOUS
  Filled 2016-03-18: qty 20

## 2016-03-18 MED ORDER — FENTANYL 100 MCG/HR TD PT72: 100.0000 ug | MEDICATED_PATCH | TRANSDERMAL | Status: DC

## 2016-03-18 NOTE — Telephone Encounter (Signed)
Called to report that his insurance denied them going to see pt so [facility] will be opening him to services, they have forwarded the referral to them.

## 2016-03-18 NOTE — Progress Notes (Signed)
Patient is feeling better than last week.

## 2016-03-18 NOTE — Progress Notes (Signed)
Creatinine: 1.81. MD, Dr. Grayland Ormond, notified and already aware. Per MD, Dr. Grayland Ormond, order: proceed with Opdivo treatment today.

## 2016-03-23 NOTE — Progress Notes (Signed)
[place]  Telephone:(336) [phone] Fax:(336) [phone]  ID: Louis Alexander OB: August 18, 1941  MR#: [id number]  [id number]  Patient Care Team: Birdie Sons, MD as PCP - General (Family Medicine) Murrell Redden, MD (Urology) Dionisio David, MD as Consulting Physician (Cardiology) Lloyd Huger, MD as Consulting Physician (Oncology)  CHIEF COMPLAINT:  Chief Complaint  Patient presents with  . urothelial cancer    INTERVAL HISTORY: Patient returns to clinic for further evaluation and reconsideration of cycle 4 of nivolumab. He continues to have increased weakness and fatigue, but this is improved over the past week. He continues to have a poor appetite and decreased PO intake, but has noted this has improved since starting his Medrol Dosepak 3 days ago. His pain is currently well-controlled with the fentanyl patch. He does not complain of chest pain or shortness of breath. He has no neurologic complaints. Patient denies any fevers. He denies any diarrhea but he has had occasional constipation but it is controlled with miralax.  He offers no further specific complaints today.  REVIEW OF SYSTEMS:   Review of Systems  Constitutional: Positive for weight loss and malaise/fatigue. Negative for fever.  Respiratory: Negative.  Negative for shortness of breath.   Cardiovascular: Negative.  Negative for chest pain.  Gastrointestinal: Positive for nausea, vomiting and constipation. Negative for abdominal pain.  Genitourinary: Negative.   Musculoskeletal: Negative.   Neurological: Positive for weakness.  Psychiatric/Behavioral: The patient is nervous/anxious.     As per HPI. Otherwise, a complete review of systems is negatve.  PAST MEDICAL HISTORY: Past Medical History  Diagnosis Date  . Abnormal EKG   . Coronary artery disease   . Diabetes mellitus without complication ([place])   . Obesity   . Hyperlipidemia   . Hypertension   . Sleep apnea     could not  tolerate cpap mask  . Arthritis     oa  . Cancer [facility])     bladder and prostate  . History of radiation therapy 2012  . History of chemotherapy 2014  . Bladder cancer ([place])   . History of chicken pox   . Myocardial infarction ([place]) 2008 and 2012    x 2    PAST SURGICAL HISTORY: Past Surgical History  Procedure Laterality Date  . Cardiac catheterization  12-06-2003  . Lad stent  2000 x1 stent, 2008 x 1 stent, 2012 x 1 stent  . Hernia repair  yrs ago  . Appendectomy  many yrs ago  . Rotator cuff repair Left yrs ago  . Seed implant to prostate with radiation  2012  . Protatectomy and urostomy  2014  . Total hip arthroplasty Right 12/27/2013    Procedure: RIGHT TOTAL HIP ARTHROPLASTY ANTERIOR APPROACH;  Surgeon: Mauri Pole, MD;  [facility]: [facility];  Service: Orthopedics;  Laterality: Right;  . Cardiac catheterization N/A 06/04/2015    Procedure: Left Heart Cath and Coronary Angiography;  Surgeon: Corey Skains, MD;  Location: [place] CV LAB;  Service: Cardiovascular;  Laterality: N/A;  . Cardiac catheterization N/A 06/04/2015    Procedure: Coronary Stent Intervention;  Surgeon: Wellington Hampshire, MD;  Location: [place] CV LAB;  Service: Cardiovascular;  Laterality: N/A;  . Peripheral vascular catheterization N/A 09/25/2015    Procedure: IVC Filter Insertion;  Surgeon: Katha Cabal, MD;  Location: [place] [facility] LAB;  Service: Cardiovascular;  Laterality: N/A;    FAMILY HISTORY: Reviewed and unchanged. No report of malignancy or chronic disease.  ADVANCED DIRECTIVES:    HEALTH MAINTENANCE: Social History  Substance Use Topics  . Smoking status: Never Smoker   . Smokeless tobacco: Never Used  . Alcohol Use: 0.0 oz/week    0 Standard drinks or equivalent per week     Comment: occasional     Allergies  Allergen Reactions  . Celebrex [Celecoxib]     Hematuria Other reaction(s): Bleeding Bleeding in bladder  . Effient [Prasugrel] Other (See Comments)     blood clots, blood in urine Other reaction(s): Bleeding Bleeding in bladder    Current Outpatient Prescriptions  Medication Sig Dispense Refill  . ALPRAZolam (XANAX) 0.25 MG tablet Take 1 tablet (0.25 mg total) by mouth 2 (two) times daily as needed for anxiety. 30 tablet 0  . apixaban (ELIQUIS) 5 MG TABS tablet Take 5 mg by mouth 2 (two) times daily.    [name] aspirin EC 81 MG tablet Take 81 mg by mouth daily.    [name] atenolol (TENORMIN) 25 MG tablet take 1 TABLET, ORAL, DAILY 90 tablet 4  . Coenzyme Q10 10 MG capsule Take 10 mg by mouth.    . fentaNYL (DURAGESIC - DOSED MCG/HR) 100 MCG/HR Place 1 patch (100 mcg total) onto the skin every 3 (three) days. 10 patch 0  . Fluticasone Furoate-Vilanterol (BREO ELLIPTA) 100-25 MCG/INH AEPB Inhale 1 Inhaler into the lungs daily.    . furosemide (LASIX) 20 MG tablet Take 20 mg by mouth 2 (two) times daily.    [name] glipiZIDE (GLUCOTROL) 10 MG tablet TAKE 1 TABLET(S) BY MOUTH DAILY 30 tablet 12  . glucose blood (ONE TOUCH ULTRA TEST) test strip Check blood sugar daily. Dx: E11.9 100 each 3  . metFORMIN (GLUCOPHAGE-XR) 500 MG 24 hr tablet 2 (TWO) TABLET, ORAL, TWO TIMES DAILY 120 tablet 11  . Multiple Vitamin (MULTIVITAMIN WITH MINERALS) TABS tablet Take 1 tablet by mouth daily.    . ondansetron (ZOFRAN) 8 MG tablet Take 1 tablet (8 mg total) by mouth every 8 (eight) hours as needed for nausea or vomiting. 60 tablet 2  . pantoprazole (PROTONIX) 40 MG tablet Take 40 mg by mouth 2 (two) times daily.    . posaconazole (NOXAFIL) 40 MG/ML suspension Take 10 mLs (400 mg total) by mouth 2 (two) times daily. 237 mL 2  . potassium chloride (K-DUR) 10 MEQ tablet Take 10 mEq by mouth daily.    . prasugrel (EFFIENT) 10 MG TABS tablet Take 10 mg by mouth daily. Reported on 12/03/2015    . predniSONE (STERAPRED UNI-PAK 21 TAB) 10 MG (21) TBPK tablet Take 1 tablet (10 mg total) by mouth daily. Taper as directed. Day 1-60 mg; Day 2-50 mg; Day 3-40 mg; Day 4-30 mg. Day 5-20 mg.  Day 6-10 mg. 21 tablet 0  . prochlorperazine (COMPAZINE) 10 MG tablet TAKE 1 TABLET BY MOUTH EVERY 6 HOURS AS NEEDED 40 tablet 2  . sotalol (BETAPACE) 80 MG tablet Take by mouth 2 (two) times daily.     No current facility-administered medications for this visit.   Facility-Administered Medications Ordered in Other Visits  Medication Dose Route Frequency Provider Last Rate Last Dose  . chlorhexidine (HIBICLENS) 4 % liquid 4 application  60 mL Topical Once Danae Orleans, PA-C       And  . chlorhexidine (HIBICLENS) 4 % liquid 4 application  60 mL Topical Once Danae Orleans, PA-C      . sodium chloride flush (NS) 0.9 % injection 10 mL  10 mL Intravenous PRN Kathlene November  Grayland Ormond, MD   10 mL at 12/25/15 0915    OBJECTIVE: Filed Vitals:   03/18/16 0943  BP: 144/77  Pulse: 52  Temp: 96.8 F (36 C)  Resp: 14     Body mass index is 24.27 kg/(m^2).    ECOG FS:2 - Symptomatic, <50% confined to bed  General: Well-developed, well-nourished, no acute distress. Eyes: anicteric sclera. Lungs: Clear to auscultation bilaterally. Heart: Regular rate and rhythm. No rubs, murmurs, or gallops. Abdomen: Soft, nontender, nondistended. No organomegaly noted, normoactive bowel sounds. Musculoskeletal: No edema, cyanosis, or clubbing. Neuro: Alert, answering all questions appropriately. Cranial nerves grossly intact. Skin: No rashes or petechiae noted. Psych: Normal affect.   LAB RESULTS:  Lab Results  Component Value Date   NA 129* 03/18/2016   K 5.1 03/18/2016   CL 95* 03/18/2016   CO2 23 03/18/2016   GLUCOSE 248* 03/18/2016   BUN 45* 03/18/2016   CREATININE 1.81* 03/18/2016   CALCIUM 9.5 03/18/2016   PROT 6.4* 03/18/2016   ALBUMIN 3.3* 03/18/2016   AST 22 03/18/2016   ALT 12* 03/18/2016   ALKPHOS 98 03/18/2016   BILITOT 0.2* 03/18/2016   GFRNONAA 35* 03/18/2016   GFRAA 41* 03/18/2016    Lab Results  Component Value Date   WBC 13.4* 03/18/2016   NEUTROABS 10.8* 03/18/2016   HGB  10.2* 03/18/2016   HCT 30.2* 03/18/2016   MCV 86.0 03/18/2016   PLT 315 03/18/2016     STUDIES: No results found.  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  PLAN:    1.  Urothelial carcinoma:  PET scan results from December 20, 2015 reported as above with at least stable disease. Given patient's persistent symptoms and declining performance status, cisplatin and gemcitabine was discontinued. Despite patient's declining performance status, we will proceed with cycle 4 of nivolumab 240 mg. Return to clinic in 2 weeks for consideration of cycle 5.  End-of-life care and hospice was also discussed with the patient and his family. They are not ready to transition to this at this time, but understand that given his declining performance status he may need to in the near future.  If patient progresses and he wishes to continue treatment, can consider treatment with atezolizumab. 2.  Lung nodule: Pathology result reviewed independently with hyphae noted on sample.  Treatment per Dr. Ola Spurr in infectious disease.  3. Pain: Improved. Likely secondary to malignancy. Patient previously had XRT to his pelvic area. Continue Fentanyl patch  100 mcg Q 72 hours.  4. Hyperglycemia: Patient is diabetic. Continue glipizide and metformin as prescribed. Invokana was been discontinued by primary care. Monitor. 5. Anemia: Mild, monitor. 6. Atrial fibrillation: Patient's heart is in regular rhythm today. Continue treatment and follow up with cardiology. 7. Nausea/Vomiting: Continue treatment as above. 8. Creatinine:  Improved with Medrol Dosepak and increased oral intake. Continue to monitor closely. Proceed with treatment as above. 9. Constipation: Miralax PRN.  Patient expressed understanding and was in agreement with this plan. He also understands that He can call clinic at any time with any questions, concerns, or complaints.   Urothelial cancer   Staging form: Kidney, AJCC  7th Edition     Clinical stage from 03/16/2015: Stage IV ([place], N1, M1) - Signed by Lloyd Huger, MD on 03/16/2015  Lloyd Huger, MD   03/23/2016 7:55 AM

## 2016-03-24 ENCOUNTER — Encounter: Payer: Self-pay | Admitting: Family Medicine

## 2016-03-24 ENCOUNTER — Ambulatory Visit (INDEPENDENT_AMBULATORY_CARE_PROVIDER_SITE_OTHER): Payer: PPO | Admitting: Family Medicine

## 2016-03-24 VITALS — BP 120/62 | HR 60 | Temp 97.8°F | Resp 18 | Wt 172.0 lb

## 2016-03-24 DIAGNOSIS — E1129 Type 2 diabetes mellitus with other diabetic kidney complication: Secondary | ICD-10-CM | POA: Diagnosis not present

## 2016-03-24 NOTE — Progress Notes (Signed)
Patient: Louis Alexander Male    DOB: Mar 24, 1941   75 y.o.   MRN: [id number] Visit Date: 03/24/2016  Today's Provider: Lelon Huh, MD   Chief Complaint  Patient presents with  . Diabetes    4 week follow up   Subjective:    HPI  Diabetes Mellitus Type II, Follow-up:   Lab Results  Component Value Date   HGBA1C 6.3 02/25/2016   HGBA1C 9.2 09/12/2015   HGBA1C 7.7 06/12/2015    Last seen for diabetes 4 weeks ago.  Management since then includes stopping Invokana due to reduced eGFR as complication of his bladder cancer and treatment for chronic fungal infections. He reports good compliance with treatment. Home blood sugar records: fasting range: 120  Episodes of hypoglycemia? no     Allergies  Allergen Reactions  . Celebrex [Celecoxib]     Hematuria Other reaction(s): Bleeding Bleeding in bladder  . Effient [Prasugrel] Other (See Comments)    blood clots, blood in urine Other reaction(s): Bleeding Bleeding in bladder   Previous Medications   ALPRAZOLAM (XANAX) 0.25 MG TABLET    Take 1 tablet (0.25 mg total) by mouth 2 (two) times daily as needed for anxiety.   APIXABAN (ELIQUIS) 5 MG TABS TABLET    Take 5 mg by mouth 2 (two) times daily.   ASPIRIN EC 81 MG TABLET    Take 81 mg by mouth daily.   ATENOLOL (TENORMIN) 25 MG TABLET    take 1 TABLET, ORAL, DAILY   COENZYME Q10 10 MG CAPSULE    Take 10 mg by mouth.   FENTANYL (DURAGESIC - DOSED MCG/HR) 100 MCG/HR    Place 1 patch (100 mcg total) onto the skin every 3 (three) days.   FLUTICASONE FUROATE-VILANTEROL (BREO ELLIPTA) 100-25 MCG/INH AEPB    Inhale 1 Inhaler into the lungs daily.   FUROSEMIDE (LASIX) 20 MG TABLET    Take 20 mg by mouth 2 (two) times daily.   GLIPIZIDE (GLUCOTROL) 10 MG TABLET    TAKE 1 TABLET(S) BY MOUTH DAILY   GLUCOSE BLOOD (ONE TOUCH ULTRA TEST) TEST STRIP    Check blood sugar daily. Dx: E11.9   METFORMIN (GLUCOPHAGE-XR) 500 MG 24 HR TABLET    2 (TWO) TABLET, ORAL, TWO TIMES DAILY     MULTIPLE VITAMIN (MULTIVITAMIN WITH MINERALS) TABS TABLET    Take 1 tablet by mouth daily.   ONDANSETRON (ZOFRAN) 8 MG TABLET    Take 1 tablet (8 mg total) by mouth every 8 (eight) hours as needed for nausea or vomiting.   PANTOPRAZOLE (PROTONIX) 40 MG TABLET    Take 40 mg by mouth 2 (two) times daily.   POSACONAZOLE (NOXAFIL) 40 MG/ML SUSPENSION    Take 10 mLs (400 mg total) by mouth 2 (two) times daily.   POTASSIUM CHLORIDE (K-DUR) 10 MEQ TABLET    Take 10 mEq by mouth daily.   PRASUGREL (EFFIENT) 10 MG TABS TABLET    Take 10 mg by mouth daily. Reported on 12/03/2015   PROCHLORPERAZINE (COMPAZINE) 10 MG TABLET    TAKE 1 TABLET BY MOUTH EVERY 6 HOURS AS NEEDED   SOTALOL (BETAPACE) 80 MG TABLET    Take by mouth 2 (two) times daily.    Review of Systems  Constitutional: Negative for fever, chills and appetite change.  Respiratory: Negative for chest tightness, shortness of breath and wheezing.   Cardiovascular: Negative for chest pain and palpitations.  Gastrointestinal: Negative for nausea, vomiting and abdominal  pain.    Social History  Substance Use Topics  . Smoking status: Never Smoker   . Smokeless tobacco: Never Used  . Alcohol Use: No   Objective:   BP 120/62 mmHg  Pulse 60  Temp(Src) 97.8 F (36.6 C) (Oral)  Resp 18  Wt 172 lb (78.019 kg)  SpO2 100%  Physical Exam   General Appearance:    Alert, cooperative, no distress, fatigued.   Eyes:    PERRL, conjunctiva/corneas clear, EOM's intact       Lungs:     Clear to auscultation bilaterally, respirations unlabored  Heart:    Regular rate and rhythm  Neurologic:   Awake, alert, oriented x 3. No apparent focal neurological           defect.           Assessment & Plan:     1. 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 Continue current meds. Call if fastings get above 200, or random sugar get above 300. Otherwise follow up in 8 weeks to check  A1c.         Lelon Huh, MD  [facility]

## 2016-03-25 ENCOUNTER — Telehealth: Payer: Self-pay

## 2016-03-25 NOTE — Telephone Encounter (Signed)
Bosie Helper on cell [phone] for clarification if they are actually requesting Hospice services or home health thru [facility].  The previous [facility] referral was forwarded to [place] due to insurance.  Randall Hiss reported that when [facility] reviewed patient's case they felt that patient is needing more care than they could offer as HH.  Randall Hiss spoke with patient's family and they felt that palliative care thru [facility] was appropriate, despite refusing hospice services at last appt.  The patient's family reported that he was receiving an injection for palliative treatment at our office but he is actually receiving Opidivo infusions.  After discussing this with Randall Hiss he is going to discuss with his department to see if he would still be able to receive Hospice services thru [facility].

## 2016-03-26 NOTE — Telephone Encounter (Signed)
Randall Hiss returned call and patient does not qualify for their Hospice care and he will reach out to the family to inform them.

## 2016-04-01 ENCOUNTER — Inpatient Hospital Stay: Payer: PPO

## 2016-04-01 ENCOUNTER — Inpatient Hospital Stay (HOSPITAL_BASED_OUTPATIENT_CLINIC_OR_DEPARTMENT_OTHER): Payer: PPO | Admitting: Oncology

## 2016-04-01 VITALS — BP 148/80 | HR 69 | Resp 16 | Wt 170.4 lb

## 2016-04-01 VITALS — BP 129/79 | HR 62

## 2016-04-01 DIAGNOSIS — C679 Malignant neoplasm of bladder, unspecified: Secondary | ICD-10-CM

## 2016-04-01 DIAGNOSIS — Z8551 Personal history of malignant neoplasm of bladder: Secondary | ICD-10-CM

## 2016-04-01 DIAGNOSIS — C787 Secondary malignant neoplasm of liver and intrahepatic bile duct: Secondary | ICD-10-CM | POA: Diagnosis not present

## 2016-04-01 DIAGNOSIS — R918 Other nonspecific abnormal finding of lung field: Secondary | ICD-10-CM

## 2016-04-01 DIAGNOSIS — M129 Arthropathy, unspecified: Secondary | ICD-10-CM

## 2016-04-01 DIAGNOSIS — R944 Abnormal results of kidney function studies: Secondary | ICD-10-CM

## 2016-04-01 DIAGNOSIS — E1165 Type 2 diabetes mellitus with hyperglycemia: Secondary | ICD-10-CM

## 2016-04-01 DIAGNOSIS — I10 Essential (primary) hypertension: Secondary | ICD-10-CM

## 2016-04-01 DIAGNOSIS — R112 Nausea with vomiting, unspecified: Secondary | ICD-10-CM

## 2016-04-01 DIAGNOSIS — C7910 Secondary malignant neoplasm of unspecified urinary organs: Secondary | ICD-10-CM

## 2016-04-01 DIAGNOSIS — Z9221 Personal history of antineoplastic chemotherapy: Secondary | ICD-10-CM

## 2016-04-01 DIAGNOSIS — Z79899 Other long term (current) drug therapy: Secondary | ICD-10-CM

## 2016-04-01 DIAGNOSIS — Z7982 Long term (current) use of aspirin: Secondary | ICD-10-CM

## 2016-04-01 DIAGNOSIS — E785 Hyperlipidemia, unspecified: Secondary | ICD-10-CM

## 2016-04-01 DIAGNOSIS — R630 Anorexia: Secondary | ICD-10-CM

## 2016-04-01 DIAGNOSIS — Z7984 Long term (current) use of oral hypoglycemic drugs: Secondary | ICD-10-CM

## 2016-04-01 DIAGNOSIS — R5383 Other fatigue: Secondary | ICD-10-CM

## 2016-04-01 DIAGNOSIS — Z8546 Personal history of malignant neoplasm of prostate: Secondary | ICD-10-CM

## 2016-04-01 DIAGNOSIS — E669 Obesity, unspecified: Secondary | ICD-10-CM

## 2016-04-01 DIAGNOSIS — I2510 Atherosclerotic heart disease of native coronary artery without angina pectoris: Secondary | ICD-10-CM

## 2016-04-01 DIAGNOSIS — R531 Weakness: Secondary | ICD-10-CM

## 2016-04-01 DIAGNOSIS — I252 Old myocardial infarction: Secondary | ICD-10-CM

## 2016-04-01 DIAGNOSIS — R634 Abnormal weight loss: Secondary | ICD-10-CM

## 2016-04-01 DIAGNOSIS — Z5112 Encounter for antineoplastic immunotherapy: Secondary | ICD-10-CM | POA: Diagnosis not present

## 2016-04-01 DIAGNOSIS — G4730 Sleep apnea, unspecified: Secondary | ICD-10-CM

## 2016-04-01 DIAGNOSIS — F419 Anxiety disorder, unspecified: Secondary | ICD-10-CM

## 2016-04-01 DIAGNOSIS — D649 Anemia, unspecified: Secondary | ICD-10-CM

## 2016-04-01 DIAGNOSIS — Z923 Personal history of irradiation: Secondary | ICD-10-CM

## 2016-04-01 DIAGNOSIS — Z7901 Long term (current) use of anticoagulants: Secondary | ICD-10-CM

## 2016-04-01 DIAGNOSIS — I4891 Unspecified atrial fibrillation: Secondary | ICD-10-CM

## 2016-04-01 DIAGNOSIS — K5900 Constipation, unspecified: Secondary | ICD-10-CM

## 2016-04-01 LAB — COMPREHENSIVE METABOLIC PANEL
ALK PHOS: 223 U/L — AB (ref 38–126)
ALT: 34 U/L (ref 17–63)
AST: 49 U/L — ABNORMAL HIGH (ref 15–41)
Albumin: 3.4 g/dL — ABNORMAL LOW (ref 3.5–5.0)
Anion gap: 10 (ref 5–15)
BUN: 45 mg/dL — ABNORMAL HIGH (ref 6–20)
CALCIUM: 9.2 mg/dL (ref 8.9–10.3)
CO2: 26 mmol/L (ref 22–32)
CREATININE: 1.85 mg/dL — AB (ref 0.61–1.24)
Chloride: 95 mmol/L — ABNORMAL LOW (ref 101–111)
GFR, EST AFRICAN AMERICAN: 40 mL/min — AB (ref >60)
GFR, EST NON AFRICAN AMERICAN: 34 mL/min — AB (ref >60)
Glucose, Bld: 94 mg/dL (ref 65–99)
Potassium: 4.8 mmol/L (ref 3.5–5.1)
SODIUM: 131 mmol/L — AB (ref 135–145)
Total Bilirubin: 0.7 mg/dL (ref 0.3–1.2)
Total Protein: 7.4 g/dL (ref 6.5–8.1)

## 2016-04-01 LAB — CBC WITH DIFFERENTIAL/PLATELET
Basophils Absolute: 0.1 10*3/uL (ref 0–0.1)
Basophils Relative: 1 %
Eosinophils Absolute: 0.4 10*3/uL (ref 0–0.7)
Eosinophils Relative: 4 %
HEMATOCRIT: 31.8 % — AB (ref 40.0–52.0)
HEMOGLOBIN: 10.7 g/dL — AB (ref 13.0–18.0)
LYMPHS PCT: 16 %
Lymphs Abs: 1.7 10*3/uL (ref 1.0–3.6)
MCH: 28.8 pg (ref 26.0–34.0)
MCHC: 33.7 g/dL (ref 32.0–36.0)
MCV: 85.4 fL (ref 80.0–100.0)
MONOS PCT: 10 %
Monocytes Absolute: 1.1 10*3/uL — ABNORMAL HIGH (ref 0.2–1.0)
NEUTROS ABS: 7.6 10*3/uL — AB (ref 1.4–6.5)
NEUTROS PCT: 69 %
Platelets: 298 10*3/uL (ref 150–440)
RBC: 3.72 MIL/uL — AB (ref 4.40–5.90)
RDW: 16.4 % — ABNORMAL HIGH (ref 11.5–14.5)
WBC: 11 10*3/uL — ABNORMAL HIGH (ref 3.8–10.6)

## 2016-04-01 MED ORDER — MORPHINE SULFATE 2 MG/ML IJ SOLN
2.0000 mg | INTRAMUSCULAR | Status: DC | PRN
  Administered 2016-04-01: 2 mg via INTRAVENOUS
  Filled 2016-04-01: qty 1

## 2016-04-01 MED ORDER — HEPARIN SOD (PORK) LOCK FLUSH 100 UNIT/ML IV SOLN
500.0000 [IU] | Freq: Once | INTRAVENOUS | Status: AC | PRN
  Administered 2016-04-01: 500 [IU]
  Filled 2016-04-01: qty 5

## 2016-04-01 MED ORDER — SODIUM CHLORIDE 0.9% FLUSH
10.0000 mL | INTRAVENOUS | Status: DC | PRN
  Filled 2016-04-01: qty 10

## 2016-04-01 MED ORDER — SODIUM CHLORIDE 0.9 % IV SOLN
Freq: Once | INTRAVENOUS | Status: AC
  Administered 2016-04-01: 12:00:00 via INTRAVENOUS
  Filled 2016-04-01: qty 1000

## 2016-04-01 MED ORDER — SODIUM CHLORIDE 0.9 % IV SOLN
240.0000 mg | Freq: Once | INTRAVENOUS | Status: AC
  Administered 2016-04-01: 240 mg via INTRAVENOUS
  Filled 2016-04-01: qty 20

## 2016-04-01 NOTE — Progress Notes (Signed)
Patient has weakness with no appetite.  He reports that his pain is elevated today because he is due to change the Fentanyl Patch today.  Would like to discuss receiving something for pain while here today for treatment.

## 2016-04-01 NOTE — Progress Notes (Signed)
Asked patient if he drove himself to cancer center and he stated that he had not.  Upon completion of treatment patient stated that he had driven himself  Two attempts to contact his wife Louis Alexander on their home number but no answer, voice mail left.  Will contact Dr. Grayland Ormond as there are concerns about patients ability to drive safely.

## 2016-04-02 IMAGING — CT NM PET TUM IMG RESTAG (PS) SKULL BASE T - THIGH
1 of 10 series · 1 of 25 positions shown · non-contrast
Comparison: Multiple exams, including 02/07/2015

CLINICAL DATA: Subsequent treatment strategy for stage IV
urothelial carcinoma.

EXAM:
NUCLEAR MEDICINE PET SKULL BASE TO THIGH
TECHNIQUE: 12.7 mCi F-18 FDG was injected intravenously. Full-ring PET imaging
was performed from the skull base to thigh after the radiotracer. CT
data was obtained and used for attenuation correction and anatomic
localization.
FASTING BLOOD GLUCOSE:  Value: 94 mg/dl

[Series 3: ct wb 5.0 b30f · axial · 5.0mm · 0.98mm/px · 1 of 329 slices shown]
[im 329/329  brain]
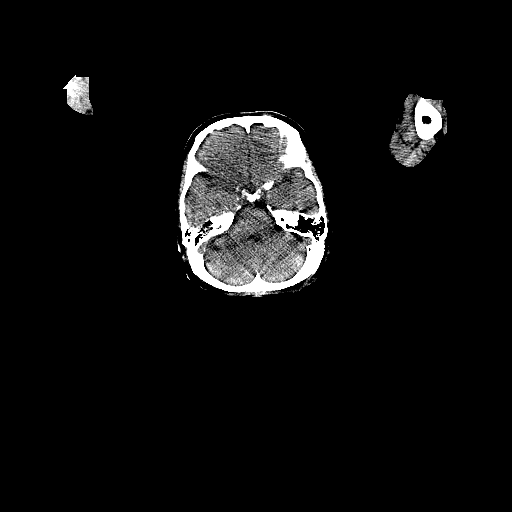

[1 of 25 positions shown; findings below may reference images not displayed]

FINDINGS: NECK

No hypermetabolic lymph nodes in the neck.

CHEST

Lymph node below the bronchus intermedius measures 8 mm in short
axis on image 98 series 3 (formerly 1.1 cm) and has maximum standard
uptake value 8.2 (formerly 8.9).

Prior hypermetabolic right apical nodule is no longer present.

Prior pleural-based hypermetabolic lesion adjacent to the right
major fissure is no longer present.

Mild lingular atelectasis. Cardiomegaly with coronary artery
atherosclerosis.

Small metal density in the left lung, stable.

Ascending aortic aneurysm, 4.2 cm diameter.

ABDOMEN/PELVIS

Hypermetabolic liver masses appear subjectively similar to prior.
Index mass in the dome of the right hepatic lobe measures
approximately 3.6 by 2.5 cm on image 124 series 3 it has maximum
standard uptake value 13.2 (formerly 19.9)

Hypermetabolic bilateral inguinal adenopathy noted, reduced in size
and activity compared to the prior exam. Index left inguinal lymph
node measures 1.5 cm in short axis on image 252 of series 3
(formerly 2.3 cm) and has maximum standard uptake value of
(formerly 17.6).

Physiologic activity in the bowel. Diffuse abnormal penile uptake
noted, maximum standard uptake value 19.9, previously 25.3.
Aortoiliac atherosclerotic vascular disease.

Right ilial conduit with herniation of omental adipose tissue.
Pelvic floor laxity. Right hip prosthesis.

SKELETON

Reduced activity of the osseous metastatic disease. Specifically the
large right sacroiliac mass demonstrates considerable internal
necrosis with maximum standard uptake value 8.3 (formerly 24.1), and
a small index right posterior acetabular column lesion has a maximum
standard uptake value of 2.8 (formerly 7.0).
IMPRESSION: 1. Although there is a considerable burden of remaining metastatic
disease, there has been some improvement. Specifically, lesions
generally measure at significantly reduced metabolic activity
compared to prior, and the right pulmonary nodule and right
pleural-based nodule have resolved.
2. Other imaging findings of potential clinical significance:
Cardiomegaly with coronary artery atherosclerosis. Ascending aortic
aneurysm, 4.2 cm diameter. Aortoiliac atherosclerotic vascular
disease. Pelvic floor laxity. Right ilial conduit with herniation of
omental adipose tissue.

## 2016-04-06 ENCOUNTER — Telehealth: Payer: Self-pay | Admitting: *Deleted

## 2016-04-06 ENCOUNTER — Encounter: Payer: Self-pay | Admitting: *Deleted

## 2016-04-06 DIAGNOSIS — I214 Non-ST elevation (NSTEMI) myocardial infarction: Secondary | ICD-10-CM

## 2016-04-06 DIAGNOSIS — Z9861 Coronary angioplasty status: Secondary | ICD-10-CM

## 2016-04-06 NOTE — Progress Notes (Signed)
Cardiac Individual Treatment Plan  Patient Details  Name: Louis Alexander MRN: [id number] Date of Birth: 24-Mar-1941 Referring Provider:    Initial Encounter Date:       Cardiac Rehab from 07/31/2015 in [facility] Cardiac and Pulmonary Rehab   Date  07/31/15      Visit Diagnosis: S/P PTCA (percutaneous transluminal coronary angioplasty)  NSTEMI (non-ST elevated myocardial infarction) [facility])  Patient's Home Medications on Admission:  Current outpatient prescriptions:  .  ALPRAZolam (XANAX) 0.25 MG tablet, Take 1 tablet (0.25 mg total) by mouth 2 (two) times daily as needed for anxiety., Disp: 30 tablet, Rfl: 0 .  apixaban (ELIQUIS) 5 MG TABS tablet, Take 5 mg by mouth 2 (two) times daily., Disp: , Rfl:  .  aspirin EC 81 MG tablet, Take 81 mg by mouth daily., Disp: , Rfl:  .  atenolol (TENORMIN) 25 MG tablet, take 1 TABLET, ORAL, DAILY, Disp: 90 tablet, Rfl: 4 .  Coenzyme Q10 10 MG capsule, Take 10 mg by mouth., Disp: , Rfl:  .  fentaNYL (DURAGESIC - DOSED MCG/HR) 100 MCG/HR, Place 1 patch (100 mcg total) onto the skin every 3 (three) days., Disp: 10 patch, Rfl: 0 .  Fluticasone Furoate-Vilanterol (BREO ELLIPTA) 100-25 MCG/INH AEPB, Inhale 1 Inhaler into the lungs daily., Disp: , Rfl:  .  furosemide (LASIX) 20 MG tablet, Take 20 mg by mouth 2 (two) times daily., Disp: , Rfl:  .  glipiZIDE (GLUCOTROL) 10 MG tablet, TAKE 1 TABLET(S) BY MOUTH DAILY, Disp: 30 tablet, Rfl: 12 .  glucose blood (ONE TOUCH ULTRA TEST) test strip, Check blood sugar daily. Dx: E11.9, Disp: 100 each, Rfl: 3 .  metFORMIN (GLUCOPHAGE-XR) 500 MG 24 hr tablet, 2 (TWO) TABLET, ORAL, TWO TIMES DAILY, Disp: 120 tablet, Rfl: 11 .  Multiple Vitamin (MULTIVITAMIN WITH MINERALS) TABS tablet, Take 1 tablet by mouth daily., Disp: , Rfl:  .  ondansetron (ZOFRAN) 8 MG tablet, Take 1 tablet (8 mg total) by mouth every 8 (eight) hours as needed for nausea or vomiting., Disp: 60 tablet, Rfl: 2 .  pantoprazole (PROTONIX) 40 MG tablet,  Take 40 mg by mouth 2 (two) times daily., Disp: , Rfl:  .  posaconazole (NOXAFIL) 40 MG/ML suspension, Take 10 mLs (400 mg total) by mouth 2 (two) times daily., Disp: 237 mL, Rfl: 2 .  potassium chloride (K-DUR) 10 MEQ tablet, Take 10 mEq by mouth daily., Disp: , Rfl:  .  prasugrel (EFFIENT) 10 MG TABS tablet, Take 10 mg by mouth daily. Reported on 12/03/2015, Disp: , Rfl:  .  prochlorperazine (COMPAZINE) 10 MG tablet, TAKE 1 TABLET BY MOUTH EVERY 6 HOURS AS NEEDED, Disp: 40 tablet, Rfl: 2 .  sotalol (BETAPACE) 80 MG tablet, Take by mouth 2 (two) times daily., Disp: , Rfl:  No current facility-administered medications for this visit.  Facility-Administered Medications Ordered in Other Visits:  .  Shower Chin To Toes With 60 mL chlorhexidine (HIBICLENS) the night before surgery, , , Once **AND** Shower Chin To Toes With 60 mL chlorhexidine (HIBICLENS) in AM of surgery after pre-op clip completed, , , Once **AND** chlorhexidine (HIBICLENS) 4 % liquid 4 application, 60 mL, Topical, Once **AND** chlorhexidine (HIBICLENS) 4 % liquid 4 application, 60 mL, Topical, Once, Danae Orleans, PA-C .  sodium chloride flush (NS) 0.9 % injection 10 mL, 10 mL, Intravenous, PRN, Lloyd Huger, MD, 10 mL at 12/25/15 0915  Past Medical History: Past Medical History  Diagnosis Date  . Abnormal EKG   . Coronary  artery disease   . Diabetes mellitus without complication ([place])   . Obesity   . Hyperlipidemia   . Hypertension   . Sleep apnea     could not tolerate cpap mask  . Arthritis     oa  . Cancer [facility])     bladder and prostate  . History of radiation therapy 2012  . History of chemotherapy 2014  . Bladder cancer ([place])   . History of chicken pox   . Myocardial infarction ([place]) 2008 and 2012    x 2    Tobacco Use: History  Smoking status  . Never Smoker   Smokeless tobacco  . Never Used    Labs: Recent Review Flowsheet Data    Labs for ITP Cardiac and Pulmonary Rehab Latest Ref Rng  10/26/2012 12/22/2014 06/12/2015 09/12/2015 02/25/2016   Cholestrol 0 - 200 mg/dL 134 189 - - -   LDLCALC - 41 115 - - -   HDL 35 - 70 mg/dL 36(L) 41 - - -   Trlycerides 40 - 160 mg/dL 284(H) 165(Louis) - - -   Hemoglobin A1c - - 7.2(Louis) 7.7 9.2 6.3       Exercise Target Goals:    Exercise Program Goal: Individual exercise prescription set with THRR, safety & activity barriers. Participant demonstrates ability to understand and report RPE using BORG scale, to self-measure pulse accurately, and to acknowledge the importance of the exercise prescription.  Exercise Prescription Goal: Starting with aerobic activity 30 plus minutes Louis day, 3 days per week for initial exercise prescription. Provide home exercise prescription and guidelines that participant acknowledges understanding prior to discharge.  Activity Barriers & Risk Stratification:   6 Minute Walk:   Initial Exercise Prescription:   Perform Capillary Blood Glucose checks as needed.  Exercise Prescription Changes:     Exercise Prescription Changes      10/15/15 1400 11/13/15 0700 11/30/15 0733 12/12/15 0800 12/26/15 0800   Exercise Review   Progression No  Absent since last review; last visit 08/06/15 No  Absent since last review; last visit 08/06/15 Yes     Response to Exercise   Blood Pressure (Admit)   138/82 mmHg     Blood Pressure (Exercise)   130/74 mmHg     Blood Pressure (Exit)   114/66 mmHg     Heart Rate (Admit)   74 bpm     Heart Rate (Exercise)   88 bpm     Heart Rate (Exit)   72 bpm     Rating of Perceived Exertion (Exercise)   13     Symptoms   None None None   Comments  Exercise workloads will need to be reviewed upon his return based on his exercise capacity.  Louis Alexander is actually in good fitness for being out for so long and feels Louis lot better. He made some increases this month and is encouraged by how well he feels. He is very compliant to exercise suggestions and willing to progress.  Reviewed individualized  exercise prescription and made increases per departmental policy. Exercise increases were discussed with the patient and they were able to perform the new work loads without issue (no signs or symptoms).    Duration Progress to 30 minutes of continuous aerobic without signs/symptoms of physical distress Progress to 30 minutes of continuous aerobic without signs/symptoms of physical distress Progress to 30 minutes of continuous aerobic without signs/symptoms of physical distress Progress to 30 minutes of continuous aerobic without signs/symptoms of physical distress Progress  to 50 minutes of aerobic without signs/symptoms of physical distress   Intensity Rest + 30 Rest + 30 Rest + 30 Rest + 30 Rest + 30   Progression   Progression Continue progressive overload as per policy without signs/symptoms or physical distress. Continue progressive overload as per policy without signs/symptoms or physical distress. Continue progressive overload as per policy without signs/symptoms or physical distress. Continue progressive overload as per policy without signs/symptoms or physical distress. Continue progressive overload as per policy without signs/symptoms or physical distress.   Resistance Training   Training Prescription (read-only) _0    Weight (read-only) _1 Reps (read-only) 10-15 10-15 10-15 10-15 10-15   Interval Training   Interval Training _2    Treadmill   MPH (read-only) 1.5 1.5 1.7 1.7 1.7   Grade (read-only) 0 0 0 0 0   Minutes (read-only) _3 REL-XR   Level (read-only) _4 Watts (read-only) 40 40 29 60 60   Minutes (read-only) _5 Biostep-RELP   Level (read-only)   _6 Watts (read-only)   45 45 45   Minutes (read-only)   _7 12/28/15 1027 01/28/16 1100         Exercise Review   Progression Yes No  Absent since last review; last visit 12/28/15      Response to Exercise   Blood Pressure (Admit) 150/78 mmHg  --      Blood Pressure (Exercise) 148/80 mmHg --      Blood Pressure (Exit) 118/70 mmHg --      Heart Rate (Admit) 88 bpm --      Heart Rate (Exercise) 86 bpm --      Heart Rate (Exit) 70 bpm --      Rating of Perceived Exertion (Exercise) 13 --      Symptoms None None      Comments Louis Alexander continues to progress despite ongoing health issues. Louis Alexander is going to try to do some activity at home on days he is not in class. He will start with one day Louis week of doing light activity at home. Ex. rx. will be re-evaluated upon return due to extended absence      Duration Progress to 50 minutes of aerobic without signs/symptoms of physical distress Progress to 50 minutes of aerobic without signs/symptoms of physical distress      Intensity Rest + 30 Rest + 30      Progression   Progression Continue progressive overload as per policy without signs/symptoms or physical distress. Continue progressive overload as per policy without signs/symptoms or physical distress.      Resistance Training   Training Prescription (read-only) Yes       Weight (read-only) 2       Reps (read-only) 10-15       Interval Training   Interval Training No No      Treadmill   MPH (read-only) 1.7       Grade (read-only) 0       Minutes (read-only) 15       REL-XR   Level (read-only) 7       Watts (read-only) 60       Minutes (read-only) 20       Biostep-RELP   Level (read-only) 7       Watts (read-only) 40  Minutes (read-only) 15       Home Exercise Plan   Frequency Add 1 additional day to program exercise sessions. --         Exercise Comments:     Exercise Comments      03/05/16 0651 04/02/16 0655         Exercise Comments The patient has not attended class since the last review. There has been no progress made due to lack of attendance. Workloads may need to be adjusted if the patient returns to class.  The patient has not attended class since the last review. There has been no progress made due to lack of  attendance. Workloads may need to be adjusted if the patient returns to class.          Discharge Exercise Prescription (Final Exercise Prescription Changes):     Exercise Prescription Changes - 01/28/16 1100    Exercise Review   Progression No  Absent since last review; last visit 12/28/15   Response to Exercise   Blood Pressure (Admit) --   Blood Pressure (Exercise) --   Blood Pressure (Exit) --   Heart Rate (Admit) --   Heart Rate (Exercise) --   Heart Rate (Exit) --   Rating of Perceived Exertion (Exercise) --   Symptoms None   Comments Ex. rx. will be re-evaluated upon return due to extended absence   Duration Progress to 50 minutes of aerobic without signs/symptoms of physical distress   Intensity Rest + 30   Progression   Progression Continue progressive overload as per policy without signs/symptoms or physical distress.   Interval Training   Interval Training No   Home Exercise Plan   Frequency --      Nutrition:  Target Goals: Understanding of nutrition guidelines, daily intake of sodium <1537m, cholesterol <209m calories 30% from fat and 7% or less from saturated fats, daily to have 5 or more servings of fruits and vegetables.  Biometrics:    Nutrition Therapy Plan and Nutrition Goals:     Nutrition Therapy & Goals - 12/07/15 0931    Personal Nutrition Goals   Personal Goal #1 Louis Alexander still doing well with his heart healthy eating and has no concerns or questions      Nutrition Discharge: Rate Your Plate Scores:   Nutrition Goals Re-Evaluation:     Nutrition Goals Re-Evaluation      10/09/15 1333           Personal Goal #1 Re-Evaluation   Personal Goal #1 DoZubairalled Alexander said he wants to return to Cardiac REhab in about Louis month. He is currently getting Louis cancer treatment today. ARSisco Alexander nutritionist.          Psychosocial: Target Goals: Acknowledge presence or absence of depression, maximize coping skills, provide  positive support system. Participant is able to verbalize types and ability to use techniques and skills needed for reducing stress and depression.  Initial Review & Psychosocial Screening:   Quality of Life Scores:   PHQ-9:     Recent Review Flowsheet Data    [facility] 05/03/2015   Decreased Interest 0 0   Down, Depressed, Hopeless 0 0   PHQ - 2 Score 0 0   Altered sleeping 3 -   Tired, decreased energy 1 -   Change in appetite 0 -   Feeling bad or failure about yourself  0 -   Trouble concentrating 0 -   Moving slowly  or fidgety/restless 1 -   Suicidal thoughts 0 -   PHQ-9 Score 5 -   Difficult doing work/chores Not difficult at all -      Psychosocial Evaluation and Intervention:     Psychosocial Evaluation - 12/12/15 1037    Psychosocial Evaluation & Interventions   Interventions Therapist referral;Encouraged to exercise with the program and follow exercise prescription;Stress management education   Comments Counselor met with Louis Alexander for initial psychosocical evaluation.  Louis Alexander has been attending this program for awhile, but is on Louis different schedule than counselor, so hard to connect.  Louis Alexander is Louis 75 year old who had his 4th stent inserted several months ago.  He has Louis strong support system with Louis spouse of 67 years, several adult children close by and active involvement in Louis local church community.  Louis Alexander has other health issues that are impacting his recovery as he is currently on Chemotherapy due to several cancer spots detected in his body.  Louis Alexander states he sleeps okay (Louis little better lately) and his appetite is "not good" due to the Chemo.  He denies Louis history of depression or anxiety, but does admit some current symptoms with sadness and tearfulness more lately.  Louis Alexander reported his mood is Louis "3" on Louis scale of 1-10, with "10" being best.  Louis Alexander states his mood Alexander better several weeks ago, until he learned about additional "spots" of cancer  he Alexander not aware of at the time.  He admits his health, trying to get the house sold, and finances are the biggest stressors for him currently.  He had goals for increased energy for this program and has already experienced that, with Louis Alexander reporting being able to walk faster and further lately.  He plans to join Louis gym to continue working out consistently upon completion of this program.  Counselor recommended Louis Alexander see Louis local therapist to address his mood and provide supportive services during this time with so many stressors and health issues impacting his functioning.  Contact information for the therapist Alexander provided.     Continued Psychosocial Services Needed --  Counselor will follow up with recommendation for Louis Alexander to see Louis counselor, as well as participation in stress management  psychoeducational component of this program.        Psychosocial Re-Evaluation:     Psychosocial Re-Evaluation      10/09/15 1342 01/28/16 1300         Psychosocial Re-Evaluation   Comments Louis Alexander and said he wants to return to Cardiac REhab in about Louis month. He is currently getting Louis cancer treatment today. [place] has Louis counselor for their patients also. Louis Alexander Alexander also reported he Alexander in the hospital for Blood clot I called Louis Alexander and he and his wife are packing up their house since their house is up for sale. He is nauseaseated today. He is suppose to get another cancer treatment tomorrow. "It is too much for him to do Cardiac REhab now"         Vocational Rehabilitation: Provide vocational rehab assistance to qualifying candidates.   Vocational Rehab Evaluation & Intervention:   Education: Education Goals: Education classes will be provided on Louis weekly basis, covering required topics. Participant will state understanding/return demonstration of topics presented.  Learning Barriers/Preferences:   Education Topics: General Nutrition Guidelines/Fats and Fiber: -Group  instruction provided by verbal, written material, models and posters to present the general  guidelines for heart healthy nutrition. Gives an explanation and review of dietary fats and fiber.   Controlling Sodium/Reading Food Labels: -Group verbal and written material supporting the discussion of sodium use in heart healthy nutrition. Review and explanation with models, verbal and written materials for utilization of the food label.   Exercise Physiology & Risk Factors: - Group verbal and written instruction with models to review the exercise physiology of the cardiovascular system and associated critical values. Details cardiovascular disease risk factors and the goals associated with each risk factor.          Cardiac Rehab from 12/26/2015 in [facility] Cardiac and Pulmonary Rehab   Date  11/21/15   Educator  RM   Instruction Review Code  2- meets goals/outcomes      Aerobic Exercise & Resistance Training: - Gives group verbal and written discussion on the health impact of inactivity. On the components of aerobic and resistive training programs and the benefits of this training and how to safely progress through these programs.   Flexibility, Balance, General Exercise Guidelines: - Provides group verbal and written instruction on the benefits of flexibility and balance training programs. Provides general exercise guidelines with specific guidelines to those with heart or lung disease. Demonstration and skill practice provided.      Cardiac Rehab from 12/26/2015 in [facility] Cardiac and Pulmonary Rehab   Date  08/06/15   Educator  [facility]   Instruction Review Code  2- meets goals/outcomes      Stress Management: - Provides group verbal and written instruction about the health risks of elevated stress, cause of high stress, and healthy ways to reduce stress.   Depression: - Provides group verbal and written instruction on the correlation between heart/lung disease and depressed mood, treatment  options, and the stigmas associated with seeking treatment.   Anatomy & Physiology of the Heart: - Group verbal and written instruction and models provide basic cardiac anatomy and physiology, with the coronary electrical and arterial systems. Review of: AMI, Angina, Valve disease, Heart Failure, Cardiac Arrhythmia, Pacemakers, and the ICD.   Cardiac Procedures: - Group verbal and written instruction and models to describe the testing methods done to diagnose heart disease. Reviews the outcomes of the test results. Describes the treatment choices: Medical Management, Angioplasty, or Coronary Bypass Surgery.   Cardiac Medications: - Group verbal and written instruction to review commonly prescribed medications for heart disease. Reviews the medication, class of the drug, and side effects. Includes the steps to properly store meds and maintain the prescription regimen.      Cardiac Rehab from 12/26/2015 in [facility] Cardiac and Pulmonary Rehab   Date  12/26/15   Educator  SB   Instruction Review Code  2- meets goals/outcomes      Go Sex-Intimacy & Heart Disease, Get SMART - Goal Setting: - Group verbal and written instruction through game format to discuss heart disease and the return to sexual intimacy. Provides group verbal and written material to discuss and apply goal setting through the application of the S.M.Louis.R.T. Method.   Other Matters of the Heart: - Provides group verbal, written materials and models to describe Heart Failure, Angina, Valve Disease, and Diabetes in the realm of heart disease. Includes description of the disease process and treatment options available to the cardiac patient.   Exercise & Equipment Safety: - Individual verbal instruction and demonstration of equipment use and safety with use of the equipment.      Cardiac Rehab from 12/26/2015 in [facility] Cardiac  and Pulmonary Rehab   Date  08/06/15   Educator  RM   Instruction Review Code  2- meets goals/outcomes       Infection Prevention: - Provides verbal and written material to individual with discussion of infection control including proper hand washing and proper equipment cleaning during exercise session.      Cardiac Rehab from 12/26/2015 in [facility] Cardiac and Pulmonary Rehab   Date  08/06/15   Educator  RM   Instruction Review Code  2- meets goals/outcomes      Falls Prevention: - Provides verbal and written material to individual with discussion of falls prevention and safety.   Diabetes: - Individual verbal and written instruction to review signs/symptoms of diabetes, desired ranges of glucose level fasting, after meals and with exercise. Advice that pre and post exercise glucose checks will be done for 3 sessions at entry of program.      Cardiac Rehab from 12/26/2015 in [facility] Cardiac and Pulmonary Rehab   Date  07/31/15   Educator  SB   Instruction Review Code  2- meets goals/outcomes       Knowledge Questionnaire Score:   Core Components/Risk Factors/Patient Goals at Admission:   Core Components/Risk Factors/Patient Goals Review:      Goals and Risk Factor Review      10/09/15 1340 12/04/15 0737 12/07/15 0931 12/31/15 1035 01/28/16 1301   Core Components/Risk Factors/Patient Goals Review   Personal Goals Review   Hypertension;Diabetes;Lipids  Diabetes;Hypertension;Lipids   Review     I just called and Louis Alexander Alexander lying down resting since his is nauseated from his cancer treatments she said. She said it is hard for him to eat with the nausea. Louis Alexander's wife said she knows he hopes to get back exercising in Cardiac Rehab in Louis month or so.    Expected Outcomes     Long term to be able to exercise in Cardiac Rehab to help his HTN, Diabetes and Lipids.    Increase Aerobic Exercise and Physical Activity (read-only)   Goals Progress/Improvement seen  No Yes  Yes    Comments Louis Alexander called Alexander and said he wants to return to Cardiac REhab in about Louis month. He is currently getting Louis cancer  treatment today. [place] has nutritionist. Emory Alexander also reported he Alexander in the hospital for Blood clots. Louis Alexander is progressing well ,especially considering he Alexander out for several months. The cancer and treatments were affecting his exercise and he is feeling much better now. Until he has more consistent attendance, we will hold off on interval training and adding exercise at home. We will follow up with him in February on how he is feeling with the exercise and add new goals at that time.   Louis Alexander continues to progress despite ongoing health issues, his attendance has been more consistent which has helped with better exercise progression and him feeling more confident with exercise workloads. Louis Alexander is going to try to do some activity at home on days he is not in class. He will start with one day Louis week of doing light activity at home. We will follow up with him in the coming weeks on how that home exercise is going.     Diabetes (read-only)   Goal --  See [facility] lab work. Has been out since Sept from Cardiac Rehab.        Progress seen towards goals   No     Comments   No change  Hypertension (read-only)   Goal --  Louis Alexander called Alexander and said he wants to return to Cardiac REhab in about Louis month. He is currently getting Louis cancer treatment today. [place] has nutritionist. Louis Alexander Alexander also reported he Alexander in the hospital for Blood clot       Progress seen toward goals   No     Comments   No change, BP medication is doing its job  and BP readings in class are in appropriate range     Abnormal Lipids (read-only)   Goal --  Louis Alexander called Alexander and said he wants to return to Cardiac REhab in about Louis month. He is currently getting Louis cancer treatment today. [place] has nutritionist. Trea Alexander also reported he Alexander in the hospital for Blood clot       Progress seen towards goals   No     Comments   Lipids have not been re-evaluated        Core Components/Risk Factors/Patient  Goals at Discharge (Final Review):      Goals and Risk Factor Review - 01/28/16 1301    Core Components/Risk Factors/Patient Goals Review   Personal Goals Review Diabetes;Hypertension;Lipids   Review I just called and Louis Alexander lying down resting since his is nauseated from his cancer treatments she said. She said it is hard for him to eat with the nausea. Louis Alexander's wife said she knows he hopes to get back exercising in Cardiac Rehab in [place] month or so.    Expected Outcomes Long term to be able to exercise in Cardiac Rehab to help his HTN, Diabetes and Lipids.       ITP Comments:     ITP Comments      10/22/15 1216 11/18/15 1225 11/23/15 0946 12/13/15 1134 01/08/16 0804   ITP Comments 30 day review preparation: Continue with ITP Ready for 30 day review.  Continue with ITP  Has only attended 4 sessions. Last on 08/06/2015. Has been out with medical concerns. Plans to return Jan 4.  Louis Alexander did well after first day back on Wed. He Alexander tired today, so Alexander advised to slow down and take rest breaks as needed.  Ready for 30 day review. Continue with ITP. 30 day review.  Continue with ITP.  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 He attends sessions as often as he can.     01/28/16 1259 02/07/16 1258 03/09/16 0929 04/06/16 1230     ITP Comments I called [place] Alexander and he and his wife are packing up their house since their house is up for sale. He is nauseaseated today. He is suppose to get another cancer treatment tomorrow.  30 Day Review. Continue with the ITP.  Remains out secondary to Chemo symptoms. 30 day review.  Continue with ITP  Remains out for medical reasons 30 day review. Continue with ITP. Has been absent medical reasons since2/15/2017       Comments:

## 2016-04-06 NOTE — Telephone Encounter (Signed)
Called to check on status to return to program. LMOM 

## 2016-04-06 NOTE — Progress Notes (Signed)
[facility] [facility]  [phone] [phone]  ID: Louis Alexander OB: [birth year]  MR#: [id number]  [id number]  Patient Care Team: Birdie Sons, MD as PCP - General (Family Medicine) Murrell Redden, MD (Urology) Dionisio David, MD as Consulting Physician (Cardiology) Lloyd Huger, MD as Consulting Physician (Oncology)  CHIEF COMPLAINT:  Chief Complaint  Patient presents with  . urothelial cancer    INTERVAL HISTORY: Patient returns to clinic for further evaluation and consideration of cycle 5 of nivolumab. He continues to have increased weakness and fatigue and his performance status is declining. He reports increased pain, but states this is because he is due to change his fentanyl patch. He continues to have a poor appetite and decreased PO intake. He does not complain of chest pain or shortness of breath. He has no neurologic complaints. Patient denies any fevers. He denies any diarrhea but he has had occasional constipation but it is controlled with miralax.  He offers no further specific complaints today.  REVIEW OF SYSTEMS:   Review of Systems  Constitutional: Positive for weight loss and malaise/fatigue. Negative for fever.  Respiratory: Negative.  Negative for shortness of breath.   Cardiovascular: Negative.  Negative for chest pain.  Gastrointestinal: Positive for nausea, vomiting and constipation. Negative for abdominal pain.  Genitourinary: Negative.   Musculoskeletal: Negative.   Neurological: Positive for weakness.  Psychiatric/Behavioral: The patient is nervous/anxious.     As per HPI. Otherwise, a complete review of systems is negatve.  PAST MEDICAL HISTORY: Past Medical History  Diagnosis Date  . Abnormal EKG   . Coronary artery disease   . Diabetes mellitus without complication ([place])   . Obesity   . Hyperlipidemia   . Hypertension   . Sleep apnea     could not tolerate cpap mask  . Arthritis     oa  . Cancer  [facility])     bladder and prostate  . History of radiation therapy 2012  . History of chemotherapy 2014  . Bladder cancer ([place])   . History of chicken pox   . Myocardial infarction ([place]) 2008 and 2012    x 2    PAST SURGICAL HISTORY: Past Surgical History  Procedure Laterality Date  . Cardiac catheterization  12-06-2003  . Lad stent  2000 x1 stent, 2008 x 1 stent, 2012 x 1 stent  . Hernia repair  yrs ago  . Appendectomy  many yrs ago  . Rotator cuff repair Left yrs ago  . Seed implant to prostate with radiation  2012  . Protatectomy and urostomy  2014  . Total hip arthroplasty Right 12/27/2013    Procedure: RIGHT TOTAL HIP ARTHROPLASTY ANTERIOR APPROACH;  Surgeon: Mauri Pole, MD;  Location: [facility] ORS;  Service: Orthopedics;  Laterality: Right;  . Cardiac catheterization N/A 06/04/2015    Procedure: Left Heart Cath and Coronary Angiography;  Surgeon: Corey Skains, MD;  Location: [place] [facility] LAB;  Service: Cardiovascular;  Laterality: N/A;  . Cardiac catheterization N/A 06/04/2015    Procedure: Coronary Stent Intervention;  Surgeon: Wellington Hampshire, MD;  Location: [name] CV LAB;  Service: Cardiovascular;  Laterality: N/A;  . Peripheral vascular catheterization N/A 09/25/2015    Procedure: IVC Filter Insertion;  Surgeon: Katha Cabal, MD;  Location: [name] CV LAB;  Service: Cardiovascular;  Laterality: N/A;    FAMILY HISTORY: Reviewed and unchanged. No report of malignancy or chronic disease.     ADVANCED DIRECTIVES:  HEALTH MAINTENANCE: Social History  Substance Use Topics  . Smoking status: Never Smoker   . Smokeless tobacco: Never Used  . Alcohol Use: No     Allergies  Allergen Reactions  . Celebrex [Celecoxib]     Hematuria Other reaction(s): Bleeding Bleeding in bladder  . Effient [Prasugrel] Other (See Comments)    blood clots, blood in urine Other reaction(s): Bleeding Bleeding in bladder    Current Outpatient Prescriptions    Medication Sig Dispense Refill  . ALPRAZolam (XANAX) 0.25 MG tablet Take 1 tablet (0.25 mg total) by mouth 2 (two) times daily as needed for anxiety. 30 tablet 0  . apixaban (ELIQUIS) 5 MG TABS tablet Take 5 mg by mouth 2 (two) times daily.    [name] aspirin EC 81 MG tablet Take 81 mg by mouth daily.    [name] atenolol (TENORMIN) 25 MG tablet take 1 TABLET, ORAL, DAILY 90 tablet 4  . Coenzyme Q10 10 MG capsule Take 10 mg by mouth.    . fentaNYL (DURAGESIC - DOSED MCG/HR) 100 MCG/HR Place 1 patch (100 mcg total) onto the skin every 3 (three) days. 10 patch 0  . Fluticasone Furoate-Vilanterol (BREO ELLIPTA) 100-25 MCG/INH AEPB Inhale 1 Inhaler into the lungs daily.    . furosemide (LASIX) 20 MG tablet Take 20 mg by mouth 2 (two) times daily.    [name] glipiZIDE (GLUCOTROL) 10 MG tablet TAKE 1 TABLET(S) BY MOUTH DAILY 30 tablet 12  . glucose blood (ONE TOUCH ULTRA TEST) test strip Check blood sugar daily. Dx: E11.9 100 each 3  . metFORMIN (GLUCOPHAGE-XR) 500 MG 24 hr tablet 2 (TWO) TABLET, ORAL, TWO TIMES DAILY 120 tablet 11  . Multiple Vitamin (MULTIVITAMIN WITH MINERALS) TABS tablet Take 1 tablet by mouth daily.    . ondansetron (ZOFRAN) 8 MG tablet Take 1 tablet (8 mg total) by mouth every 8 (eight) hours as needed for nausea or vomiting. 60 tablet 2  . pantoprazole (PROTONIX) 40 MG tablet Take 40 mg by mouth 2 (two) times daily.    . posaconazole (NOXAFIL) 40 MG/ML suspension Take 10 mLs (400 mg total) by mouth 2 (two) times daily. 237 mL 2  . potassium chloride (K-DUR) 10 MEQ tablet Take 10 mEq by mouth daily.    . prasugrel (EFFIENT) 10 MG TABS tablet Take 10 mg by mouth daily. Reported on 12/03/2015    . prochlorperazine (COMPAZINE) 10 MG tablet TAKE 1 TABLET BY MOUTH EVERY 6 HOURS AS NEEDED 40 tablet 2  . sotalol (BETAPACE) 80 MG tablet Take by mouth 2 (two) times daily.     Current Facility-Administered Medications  Medication Dose Route Frequency Provider Last Rate Last Dose  . morphine 2 MG/ML  injection 2 mg  2 mg Intravenous Q1H PRN Lloyd Huger, MD   2 mg at 04/01/16 1131   Facility-Administered Medications Ordered in Other Visits  Medication Dose Route Frequency Provider Last Rate Last Dose  . chlorhexidine (HIBICLENS) 4 % liquid 4 application  60 mL Topical Once Danae Orleans, PA-C       And  . chlorhexidine (HIBICLENS) 4 % liquid 4 application  60 mL Topical Once Danae Orleans, PA-C      . sodium chloride flush (NS) 0.9 % injection 10 mL  10 mL Intravenous PRN Lloyd Huger, MD   10 mL at 12/25/15 0915    OBJECTIVE: Filed Vitals:   04/01/16 1055  BP: 148/80  Pulse: 69  Resp: 16     Body mass index  is 23.78 kg/(m^2).    ECOG FS:2 - Symptomatic, <50% confined to bed  General: Well-developed, well-nourished, no acute distress. Eyes: anicteric sclera. Lungs: Clear to auscultation bilaterally. Heart: Regular rate and rhythm. No rubs, murmurs, or gallops. Abdomen: Soft, nontender, nondistended. No organomegaly noted, normoactive bowel sounds. Musculoskeletal: No edema, cyanosis, or clubbing. Neuro: Alert, answering all questions appropriately. Cranial nerves grossly intact. Skin: No rashes or petechiae noted. Psych: Normal affect.   LAB RESULTS:  Lab Results  Component Value Date   NA 131* 04/01/2016   K 4.8 04/01/2016   CL 95* 04/01/2016   CO2 26 04/01/2016   GLUCOSE 94 04/01/2016   BUN 45* 04/01/2016   CREATININE 1.85* 04/01/2016   CALCIUM 9.2 04/01/2016   PROT 7.4 04/01/2016   ALBUMIN 3.4* 04/01/2016   AST 49* 04/01/2016   ALT 34 04/01/2016   ALKPHOS 223* 04/01/2016   BILITOT 0.7 04/01/2016   GFRNONAA 34* 04/01/2016   GFRAA 40* 04/01/2016    Lab Results  Component Value Date   WBC 11.0* 04/01/2016   NEUTROABS 7.6* 04/01/2016   HGB 10.7* 04/01/2016   HCT 31.8* 04/01/2016   MCV 85.4 04/01/2016   PLT 298 04/01/2016     STUDIES: No results found.  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  PLAN:     1.  Urothelial carcinoma:  PET scan results from December 20, 2015 reported as above with at least stable disease. Given patient's persistent symptoms and declining performance status, cisplatin and gemcitabine was discontinued. Despite patient's declining performance status, we will proceed with cycle 5 of nivolumab 240 mg. Return to clinic in 2 weeks for consideration of cycle 6.  End-of-life care and hospice was previously discussed with the patient and his family. They are not ready to transition to this at this time, but understand that given his declining performance status he may need to in the near future.  If patient progresses and he wishes to continue treatment, can consider treatment with atezolizumab. 2.  Lung nodule: Pathology result reviewed independently with hyphae noted on sample.  Treatment per Dr. Ola Spurr in infectious disease.  3. Pain: Likely secondary to malignancy. Patient previously had XRT to his pelvic area. Continue Fentanyl patch  100 mcg Q 72 hours.  4. Diabetes: Continue glipizide and metformin as prescribed. Invokana was been discontinued by primary care. Monitor. 5. Anemia: Mild, monitor. 6. Atrial fibrillation: Patient's heart is in regular rhythm today. Continue treatment and follow up with cardiology. 7. Nausea/Vomiting: Continue treatment as above. 8. Creatinine:  Improved with Medrol Dosepak and increased oral intake. Continue to monitor closely. Proceed with treatment as above. 9. Constipation: Miralax PRN.  Patient expressed understanding and was in agreement with this plan. He also understands that He can call clinic at any time with any questions, concerns, or complaints.   Urothelial cancer   Staging form: Kidney, AJCC 7th Edition     Clinical stage from 03/16/2015: Stage IV (TX, N1, M1) - Signed by Lloyd Huger, MD on 03/16/2015  Lloyd Huger, MD   04/06/2016 8:48 AM

## 2016-04-08 ENCOUNTER — Encounter: Payer: Self-pay | Admitting: Emergency Medicine

## 2016-04-08 ENCOUNTER — Inpatient Hospital Stay
Admission: EM | Admit: 2016-04-08 | Discharge: 2016-04-09 | DRG: 637 | Disposition: A | Discharge status: Home / Self Care | Payer: PPO | Attending: Internal Medicine | Admitting: Internal Medicine

## 2016-04-08 ENCOUNTER — Emergency Department: Payer: PPO

## 2016-04-08 DIAGNOSIS — Z888 Allergy status to other drugs, medicaments and biological substances status: Secondary | ICD-10-CM

## 2016-04-08 DIAGNOSIS — E43 Unspecified severe protein-calorie malnutrition: Secondary | ICD-10-CM | POA: Diagnosis present

## 2016-04-08 DIAGNOSIS — I2510 Atherosclerotic heart disease of native coronary artery without angina pectoris: Secondary | ICD-10-CM | POA: Diagnosis present

## 2016-04-08 DIAGNOSIS — I252 Old myocardial infarction: Secondary | ICD-10-CM | POA: Diagnosis not present

## 2016-04-08 DIAGNOSIS — E162 Hypoglycemia, unspecified: Secondary | ICD-10-CM

## 2016-04-08 DIAGNOSIS — B488 Other specified mycoses: Secondary | ICD-10-CM | POA: Diagnosis present

## 2016-04-08 DIAGNOSIS — E669 Obesity, unspecified: Secondary | ICD-10-CM | POA: Diagnosis present

## 2016-04-08 DIAGNOSIS — Z823 Family history of stroke: Secondary | ICD-10-CM

## 2016-04-08 DIAGNOSIS — Z9221 Personal history of antineoplastic chemotherapy: Secondary | ICD-10-CM | POA: Diagnosis not present

## 2016-04-08 DIAGNOSIS — Z9889 Other specified postprocedural states: Secondary | ICD-10-CM

## 2016-04-08 DIAGNOSIS — M1990 Unspecified osteoarthritis, unspecified site: Secondary | ICD-10-CM | POA: Diagnosis present

## 2016-04-08 DIAGNOSIS — G4730 Sleep apnea, unspecified: Secondary | ICD-10-CM | POA: Diagnosis present

## 2016-04-08 DIAGNOSIS — C7800 Secondary malignant neoplasm of unspecified lung: Secondary | ICD-10-CM | POA: Diagnosis present

## 2016-04-08 DIAGNOSIS — E11649 Type 2 diabetes mellitus with hypoglycemia without coma: Secondary | ICD-10-CM | POA: Diagnosis present

## 2016-04-08 DIAGNOSIS — Z7982 Long term (current) use of aspirin: Secondary | ICD-10-CM | POA: Diagnosis not present

## 2016-04-08 DIAGNOSIS — Z8546 Personal history of malignant neoplasm of prostate: Secondary | ICD-10-CM | POA: Diagnosis not present

## 2016-04-08 DIAGNOSIS — Z923 Personal history of irradiation: Secondary | ICD-10-CM | POA: Diagnosis not present

## 2016-04-08 DIAGNOSIS — E785 Hyperlipidemia, unspecified: Secondary | ICD-10-CM | POA: Diagnosis present

## 2016-04-08 DIAGNOSIS — Z8551 Personal history of malignant neoplasm of bladder: Secondary | ICD-10-CM | POA: Diagnosis not present

## 2016-04-08 DIAGNOSIS — Z801 Family history of malignant neoplasm of trachea, bronchus and lung: Secondary | ICD-10-CM

## 2016-04-08 DIAGNOSIS — I10 Essential (primary) hypertension: Secondary | ICD-10-CM | POA: Diagnosis present

## 2016-04-08 DIAGNOSIS — Z9049 Acquired absence of other specified parts of digestive tract: Secondary | ICD-10-CM

## 2016-04-08 DIAGNOSIS — Z96641 Presence of right artificial hip joint: Secondary | ICD-10-CM | POA: Diagnosis present

## 2016-04-08 DIAGNOSIS — Z86718 Personal history of other venous thrombosis and embolism: Secondary | ICD-10-CM

## 2016-04-08 DIAGNOSIS — Z8249 Family history of ischemic heart disease and other diseases of the circulatory system: Secondary | ICD-10-CM

## 2016-04-08 DIAGNOSIS — Z6822 Body mass index (BMI) 22.0-22.9, adult: Secondary | ICD-10-CM | POA: Diagnosis not present

## 2016-04-08 DIAGNOSIS — Z800 Family history of malignant neoplasm of digestive organs: Secondary | ICD-10-CM

## 2016-04-08 DIAGNOSIS — Z79899 Other long term (current) drug therapy: Secondary | ICD-10-CM

## 2016-04-08 LAB — GLUCOSE, CAPILLARY
GLUCOSE-CAPILLARY: 79 mg/dL (ref 65–99)
GLUCOSE-CAPILLARY: 47 mg/dL — AB (ref 65–99)
Glucose-Capillary: 108 mg/dL — ABNORMAL HIGH (ref 65–99)
Glucose-Capillary: 14 mg/dL — CL (ref 65–99)
Glucose-Capillary: 182 mg/dL — ABNORMAL HIGH (ref 65–99)
Glucose-Capillary: 191 mg/dL — ABNORMAL HIGH (ref 65–99)
Glucose-Capillary: 24 mg/dL — CL (ref 65–99)
Glucose-Capillary: 87 mg/dL (ref 65–99)

## 2016-04-08 LAB — CBC WITH DIFFERENTIAL/PLATELET
BASOS ABS: 0.1 10*3/uL (ref 0–0.1)
Basophils Relative: 1 %
Eosinophils Absolute: 0.3 10*3/uL (ref 0–0.7)
HEMATOCRIT: 34.9 % — AB (ref 40.0–52.0)
Hemoglobin: 11.3 g/dL — ABNORMAL LOW (ref 13.0–18.0)
LYMPHS ABS: 1.5 10*3/uL (ref 1.0–3.6)
MCH: 27.7 pg (ref 26.0–34.0)
MCHC: 32.3 g/dL (ref 32.0–36.0)
MCV: 85.9 fL (ref 80.0–100.0)
MONO ABS: 1 10*3/uL (ref 0.2–1.0)
NEUTROS ABS: 8.9 10*3/uL — AB (ref 1.4–6.5)
Neutrophils Relative %: 76 %
PLATELETS: 373 10*3/uL (ref 150–440)
RBC: 4.07 MIL/uL — ABNORMAL LOW (ref 4.40–5.90)
RDW: 16.4 % — AB (ref 11.5–14.5)
WBC: 11.7 10*3/uL — ABNORMAL HIGH (ref 3.8–10.6)

## 2016-04-08 LAB — URINALYSIS COMPLETE WITH MICROSCOPIC (ARMC ONLY)
BILIRUBIN URINE: NEGATIVE
GLUCOSE, UA: 50 mg/dL — AB
KETONES UR: NEGATIVE mg/dL
Nitrite: NEGATIVE
PH: 8 (ref 5.0–8.0)
Protein, ur: 100 mg/dL — AB
Specific Gravity, Urine: 1.015 (ref 1.005–1.030)

## 2016-04-08 LAB — COMPREHENSIVE METABOLIC PANEL
ALT: 21 U/L (ref 17–63)
AST: 26 U/L (ref 15–41)
Albumin: 3.2 g/dL — ABNORMAL LOW (ref 3.5–5.0)
Alkaline Phosphatase: 166 U/L — ABNORMAL HIGH (ref 38–126)
Anion gap: 8 (ref 5–15)
BILIRUBIN TOTAL: 0.4 mg/dL (ref 0.3–1.2)
BUN: 53 mg/dL — AB (ref 6–20)
CHLORIDE: 100 mmol/L — AB (ref 101–111)
CO2: 25 mmol/L (ref 22–32)
Calcium: 9.5 mg/dL (ref 8.9–10.3)
Creatinine, Ser: 2.26 mg/dL — ABNORMAL HIGH (ref 0.61–1.24)
GFR, EST AFRICAN AMERICAN: 31 mL/min — AB (ref >60)
GFR, EST NON AFRICAN AMERICAN: 27 mL/min — AB (ref >60)
Glucose, Bld: 86 mg/dL (ref 65–99)
POTASSIUM: 5.4 mmol/L — AB (ref 3.5–5.1)
Sodium: 133 mmol/L — ABNORMAL LOW (ref 135–145)
TOTAL PROTEIN: 6.8 g/dL (ref 6.5–8.1)

## 2016-04-08 LAB — LACTIC ACID, PLASMA
LACTIC ACID, VENOUS: 2.8 mmol/L — AB (ref 0.5–2.0)
Lactic Acid, Venous: 2.1 mmol/L (ref 0.5–2.0)

## 2016-04-08 LAB — TROPONIN I

## 2016-04-08 MED ORDER — ONDANSETRON HCL 4 MG PO TABS: 8.0000 mg | ORAL_TABLET | Freq: Three times a day (TID) | ORAL | Status: DC | PRN

## 2016-04-08 MED ORDER — FUROSEMIDE 20 MG PO TABS
20.0000 mg | ORAL_TABLET | Freq: Two times a day (BID) | ORAL | Status: DC
  Administered 2016-04-09: 20 mg via ORAL
  Filled 2016-04-08: qty 1

## 2016-04-08 MED ORDER — DEXTROSE 50 % IV SOLN
INTRAVENOUS | Status: AC
  Filled 2016-04-08: qty 50

## 2016-04-08 MED ORDER — DEXTROSE-NACL 5-0.45 % IV SOLN
INTRAVENOUS | Status: DC
  Administered 2016-04-08 – 2016-04-09 (×3): via INTRAVENOUS

## 2016-04-08 MED ORDER — APIXABAN 5 MG PO TABS
5.0000 mg | ORAL_TABLET | Freq: Two times a day (BID) | ORAL | Status: DC
  Administered 2016-04-08 – 2016-04-09 (×2): 5 mg via ORAL
  Filled 2016-04-08 (×2): qty 1

## 2016-04-08 MED ORDER — MEGESTROL ACETATE 625 MG/5ML PO SUSP: 625.0000 mg | Freq: Every day | ORAL | Status: DC

## 2016-04-08 MED ORDER — ALPRAZOLAM 0.25 MG PO TABS: 0.2500 mg | ORAL_TABLET | Freq: Two times a day (BID) | ORAL | Status: DC | PRN

## 2016-04-08 MED ORDER — SODIUM CHLORIDE 0.9 % IV BOLUS (SEPSIS)
500.0000 mL | Freq: Once | INTRAVENOUS | Status: AC
  Administered 2016-04-08: 500 mL via INTRAVENOUS

## 2016-04-08 MED ORDER — SOTALOL HCL 80 MG PO TABS
80.0000 mg | ORAL_TABLET | Freq: Every day | ORAL | Status: DC
  Administered 2016-04-08 – 2016-04-09 (×2): 80 mg via ORAL
  Filled 2016-04-08 (×3): qty 1

## 2016-04-08 MED ORDER — ASPIRIN EC 81 MG PO TBEC
81.0000 mg | DELAYED_RELEASE_TABLET | Freq: Every day | ORAL | Status: DC
  Administered 2016-04-08 – 2016-04-09 (×2): 81 mg via ORAL
  Filled 2016-04-08 (×2): qty 1

## 2016-04-08 MED ORDER — DEXTROSE 50 % IV SOLN
INTRAVENOUS | Status: AC
  Administered 2016-04-08: 50 mL via INTRAVENOUS
  Filled 2016-04-08: qty 50

## 2016-04-08 MED ORDER — PANTOPRAZOLE SODIUM 40 MG PO TBEC
40.0000 mg | DELAYED_RELEASE_TABLET | Freq: Two times a day (BID) | ORAL | Status: DC
  Administered 2016-04-08 – 2016-04-09 (×2): 40 mg via ORAL
  Filled 2016-04-08 (×2): qty 1

## 2016-04-08 MED ORDER — COENZYME Q10 10 MG PO CAPS: 10.0000 mg | ORAL_CAPSULE | Freq: Every morning | ORAL | Status: DC

## 2016-04-08 MED ORDER — POTASSIUM CHLORIDE ER 10 MEQ PO TBCR: 10.0000 meq | EXTENDED_RELEASE_TABLET | Freq: Every day | ORAL | Status: DC

## 2016-04-08 MED ORDER — VORICONAZOLE 200 MG PO TABS
200.0000 mg | ORAL_TABLET | Freq: Two times a day (BID) | ORAL | Status: DC
  Administered 2016-04-08 – 2016-04-09 (×2): 200 mg via ORAL
  Filled 2016-04-08 (×5): qty 1

## 2016-04-08 MED ORDER — FENTANYL 100 MCG/HR TD PT72: 100.0000 ug | MEDICATED_PATCH | TRANSDERMAL | Status: DC

## 2016-04-08 MED ORDER — DEXTROSE 50 % IV SOLN
1.0000 | Freq: Once | INTRAVENOUS | Status: AC
  Administered 2016-04-08: 50 mL via INTRAVENOUS

## 2016-04-08 MED ORDER — PROCHLORPERAZINE MALEATE 10 MG PO TABS: 10.0000 mg | ORAL_TABLET | Freq: Four times a day (QID) | ORAL | Status: DC | PRN

## 2016-04-08 NOTE — Progress Notes (Signed)
Received a newly admitted Patient from ED, wheeled to the unit per stretcher, escorted by ED Tech. Patient awake, conscious and coherent, not in any form of distress. Routine admission care provided. Safety measures in place.

## 2016-04-08 NOTE — ED Notes (Signed)
Pt via ems from home after wife called for pt being unresponsive. Pt had agonal breathing upon arrival of EMS ad blood sugar of 20. Pt given amp of D50 and sugar increased to 163; pt became alert & oriented. Pt alert upon arrival. Denies pain.

## 2016-04-08 NOTE — H&P (Signed)
[facility] NAME: Louis Alexander    MR#:  [id number]  DATE OF BIRTH:  [birth year]  DATE OF ADMISSION:  04/08/2016  PRIMARY CARE PHYSICIAN: Lelon Huh, MD   REQUESTING/REFERRING PHYSICIAN: Corky Downs  CHIEF COMPLAINT:   Chief Complaint  Patient presents with  . Hypoglycemia    HISTORY OF PRESENT ILLNESS: Louis Alexander  is a 75 y.o. male with a known history of Coronary artery disease, diabetes, obesity, hyperlipidemia, hypertension, arthritis, bladder and prostate cancer with metastasis- for last few months has decreased eating, and was given megestrol . But as per family for last 1-2 weeks he has been eating good compared to what he was eating before. There is no recent change in his diabetic medications. He had good thenar last night, and patient denies taking any extra dose of his medicines in last 1 or 2 days. Today morning he did not wake up and wife for shaking him but he was not arousable so she called 911 and when they arrived his blood sugar level was 14, after injection dextrose it came up but again while transported to emergency room his blood sugar was again less than 20 twice while in the emergency room.. Social history started on dextrose IV drip and given to hospitalist team for further management. Now during my visit to his room he is on dextrose drip and blood sugar is running normal and patient is completely alert and oriented. He denies any extremity was of his medication and any suicidal ideation.  PAST MEDICAL HISTORY:   Past Medical History  Diagnosis Date  . Abnormal EKG   . Coronary artery disease   . Diabetes mellitus without complication ([place])   . Obesity   . Hyperlipidemia   . Hypertension   . Sleep apnea     could not tolerate cpap mask  . Arthritis     oa  . Cancer [facility])     bladder and prostate  . History of radiation therapy 2012  . History of chemotherapy 2014  . Bladder cancer ([place])   .  History of chicken pox   . Myocardial infarction ([place]) 2008 and 2012    x 2  . Cancer ([place])     bladder, penis, bone, lung (mets)    PAST SURGICAL HISTORY: Past Surgical History  Procedure Laterality Date  . Cardiac catheterization  12-06-2003  . Lad stent  2000 x1 stent, 2008 x 1 stent, 2012 x 1 stent  . Hernia repair  yrs ago  . Appendectomy  many yrs ago  . Rotator cuff repair Left yrs ago  . Seed implant to prostate with radiation  2012  . Protatectomy and urostomy  2014  . Total hip arthroplasty Right 12/27/2013    Procedure: RIGHT TOTAL HIP ARTHROPLASTY ANTERIOR APPROACH;  Surgeon: Mauri Pole, MD;  Location: [facility];  Service: Orthopedics;  Laterality: Right;  . Cardiac catheterization N/A 06/04/2015    Procedure: Left Heart Cath and Coronary Angiography;  Surgeon: Corey Skains, MD;  Location: [place] [facility];  Service: Cardiovascular;  Laterality: N/A;  . Cardiac catheterization N/A 06/04/2015    Procedure: Coronary Stent Intervention;  Surgeon: Wellington Hampshire, MD;  Location: [place] [facility];  Service: Cardiovascular;  Laterality: N/A;  . Peripheral vascular catheterization N/A 09/25/2015    Procedure: IVC Filter Insertion;  Surgeon: Katha Cabal, MD;  Location: [place] [facility];  Service: Cardiovascular;  Laterality: N/A;  SOCIAL HISTORY:  Social History  Substance Use Topics  . Smoking status: Never Smoker   . Smokeless tobacco: Never Used  . Alcohol Use: No    FAMILY HISTORY:  Family History  Problem Relation Age of Onset  . Stroke Father 79  . Aneurysm Father   . Heart attack Mother   . Lung cancer Other   . Stomach cancer Other     DRUG ALLERGIES:  Allergies  Allergen Reactions  . Celebrex [Celecoxib] Other (See Comments)    Hematuria Other reaction(s): Bleeding Bleeding in bladder  . Effient [Prasugrel] Other (See Comments)    blood clots, blood in urine Other reaction(s): Bleeding Bleeding in bladder    REVIEW OF SYSTEMS:    CONSTITUTIONAL: No fever, fatigue or weakness.  EYES: No blurred or double vision.  EARS, NOSE, AND THROAT: No tinnitus or ear pain.  RESPIRATORY: No cough, shortness of breath, wheezing or hemoptysis.  CARDIOVASCULAR: No chest pain, orthopnea, edema.  GASTROINTESTINAL: No nausea, vomiting, diarrhea or abdominal pain.  GENITOURINARY: No dysuria, hematuria.  ENDOCRINE: No polyuria, nocturia,  HEMATOLOGY: No anemia, easy bruising or bleeding SKIN: No rash or lesion. MUSCULOSKELETAL: No joint pain or arthritis.   NEUROLOGIC: No tingling, numbness, weakness.  PSYCHIATRY: No anxiety or depression.   MEDICATIONS AT HOME:  Prior to Admission medications   Medication Sig Start Date End Date Taking? Authorizing Provider  apixaban (ELIQUIS) 5 MG TABS tablet Take 5 mg by mouth 2 (two) times daily.   Yes Historical Provider, MD  aspirin EC 81 MG tablet Take 81 mg by mouth daily.   Yes Historical Provider, MD  atenolol (TENORMIN) 25 MG tablet take 1 TABLET, ORAL, DAILY 03/14/16  Yes Birdie Sons, MD  Coenzyme Q10 10 MG capsule Take 10 mg by mouth.   Yes Historical Provider, MD  furosemide (LASIX) 20 MG tablet Take 20 mg by mouth 2 (two) times daily.   Yes Historical Provider, MD  glipiZIDE (GLUCOTROL) 10 MG tablet TAKE 1 TABLET(S) BY MOUTH DAILY 03/05/16  Yes Birdie Sons, MD  megestrol (MEGACE ES) 625 MG/5ML suspension Take 625 mg by mouth daily.  04/02/16  Yes Historical Provider, MD  metFORMIN (GLUCOPHAGE-XR) 500 MG 24 hr tablet 2 (TWO) TABLET, ORAL, TWO TIMES DAILY 09/18/15  Yes Birdie Sons, MD  pantoprazole (PROTONIX) 40 MG tablet Take 40 mg by mouth 2 (two) times daily.   Yes Historical Provider, MD  potassium chloride (K-DUR) 10 MEQ tablet Take 10 mEq by mouth daily.   Yes Historical Provider, MD  sotalol (BETAPACE) 80 MG tablet Take by mouth 2 (two) times daily.   Yes Historical Provider, MD  voriconazole (VFEND) 200 MG tablet Take 200 mg by mouth 2 (two) times daily.   Yes  Historical Provider, MD  ALPRAZolam (XANAX) 0.25 MG tablet Take 1 tablet (0.25 mg total) by mouth 2 (two) times daily as needed for anxiety. 01/29/16   Lloyd Huger, MD  fentaNYL (DURAGESIC - DOSED MCG/HR) 100 MCG/HR Place 1 patch (100 mcg total) onto the skin every 3 (three) days. 03/18/16   Lloyd Huger, MD  ondansetron (ZOFRAN) 8 MG tablet Take 1 tablet (8 mg total) by mouth every 8 (eight) hours as needed for nausea or vomiting. 01/01/16   Lloyd Huger, MD  prochlorperazine (COMPAZINE) 10 MG tablet TAKE 1 TABLET BY MOUTH EVERY 6 HOURS AS NEEDED 01/18/16   Lloyd Huger, MD      PHYSICAL EXAMINATION:   VITAL SIGNS: Blood pressure  154/70, pulse 44, temperature 97.5 F (36.4 C), temperature source Oral, resp. rate 12, height 5' 10.5" (1.791 m), weight 77.111 kg (170 lb), SpO2 99 %.  GENERAL:  75 y.o.-year-old patient lying in the bed with no acute distress.  EYES: Pupils equal, round, reactive to light and accommodation. No scleral icterus. Extraocular muscles intact.  HEENT: Head atraumatic, normocephalic. Oropharynx and nasopharynx clear.  NECK:  Supple, no jugular venous distention. No thyroid enlargement, no tenderness.  LUNGS: Normal breath sounds bilaterally, no wheezing, rales,rhonchi or crepitation. No use of accessory muscles of respiration.  CARDIOVASCULAR: S1, S2 normal. No murmurs, rubs, or gallops.  ABDOMEN: Soft, nontender, nondistended. Bowel sounds present. No organomegaly or mass.  EXTREMITIES: No pedal edema, cyanosis, or clubbing.  NEUROLOGIC: Cranial nerves II through XII are intact. Muscle strength 5/5 in all extremities. Sensation intact. Gait not checked.  PSYCHIATRIC: The patient is alert and oriented x 3.  SKIN: No obvious rash, lesion, or ulcer.   LABORATORY PANEL:   CBC  Recent Labs Lab 04/08/16 1307  WBC 11.7*  HGB 11.3*  HCT 34.9*  PLT 373  MCV 85.9  MCH 27.7  MCHC 32.3  RDW 16.4*  LYMPHSABS 1.5  MONOABS 1.0  EOSABS 0.3   BASOSABS 0.1   ------------------------------------------------------------------------------------------------------------------  Chemistries   Recent Labs Lab 04/08/16 1307  NA 133*  K 5.4*  CL 100*  CO2 25  GLUCOSE 86  BUN 53*  CREATININE 2.26*  CALCIUM 9.5  AST 26  ALT 21  ALKPHOS 166*  BILITOT 0.4   ------------------------------------------------------------------------------------------------------------------ estimated creatinine clearance is 30.6 mL/min (by C-G formula based on Cr of 2.26). ------------------------------------------------------------------------------------------------------------------ No results for input(s): TSH, T4TOTAL, T3FREE, THYROIDAB in the last 72 hours.  Invalid input(s): FREET3   Coagulation profile No results for input(s): INR, PROTIME in the last 168 hours. ------------------------------------------------------------------------------------------------------------------- No results for input(s): DDIMER in the last 72 hours. -------------------------------------------------------------------------------------------------------------------  Cardiac Enzymes  Recent Labs Lab 04/08/16 1307  TROPONINI <0.03   ------------------------------------------------------------------------------------------------------------------ Invalid input(s): POCBNP  ---------------------------------------------------------------------------------------------------------------  Urinalysis    Component Value Date/Time   COLORURINE YELLOW* 04/08/2016 1428   COLORURINE Amber 04/13/2014 1140   APPEARANCEUR CLOUDY* 04/08/2016 1428   APPEARANCEUR Turbid 04/13/2014 1140   LABSPEC 1.015 04/08/2016 1428   LABSPEC 1.020 04/13/2014 1140   PHURINE 8.0 04/08/2016 1428   PHURINE 6.0 04/13/2014 1140   GLUCOSEU 50* 04/08/2016 1428   GLUCOSEU 50 mg/dL 04/13/2014 1140   HGBUR 2+* 04/08/2016 1428   HGBUR 1+ 04/13/2014 1140   BILIRUBINUR NEGATIVE 04/08/2016  1428   BILIRUBINUR Negative 04/13/2014 [place] 04/08/2016 1428   KETONESUR Negative 04/13/2014 1140   PROTEINUR 100* 04/08/2016 1428   PROTEINUR 100 mg/dL 04/13/2014 1140   UROBILINOGEN 0.2 12/21/2013 0821   NITRITE NEGATIVE 04/08/2016 1428   NITRITE Positive 04/13/2014 1140   LEUKOCYTESUR 3+* 04/08/2016 1428   LEUKOCYTESUR 2+ 04/13/2014 1140     RADIOLOGY: Dg Chest Portable 1 View  04/08/2016  CLINICAL DATA:  Unresponsive. Hypoglycemia and weakness. Metastatic urothelial cancer. EXAM: PORTABLE CHEST 1 VIEW COMPARISON:  Chest radiograph 12/03/2015.  PET-CT 12/20/2015. FINDINGS: Right jugular Port-A-Cath remains in place terminating over the lower SVC. The cardiomediastinal silhouette is within normal limits. 2.9 cm partially cavitary nodule in the lateral left upper lobe is stable to slightly larger than on the prior radiograph and PET-CT. There is no evidence of acute airspace consolidation, edema, pleural effusion, or pneumothorax. Thoracic spondylosis is noted. IMPRESSION: 1. No evidence of acute cardiopulmonary process. 2. Stable to  slight enlargement of cavitary left upper lobe nodule. Electronically Signed   By: Logan Bores M.D.   On: 04/08/2016 13:51    EKG: Orders placed or performed during the hospital encounter of 09/25/15  . EKG 12-Lead  . EKG 12-Lead  . ED EKG  . ED EKG  . EKG 12-Lead  . EKG 12-Lead    IMPRESSION AND PLAN:  * Severe hypoglycemia   He is on glipizide and metformin.   I will hold both of them for now and keep him on dextrose IV drip with regular blood sugar monitoring.   We may need to stop his glipizide and resume metformin if his blood sugar stays high after stopping the dextrose drip.   I explained the patient and his wife in the room, to check his blood sugar level at least 2-3 times a day after he is discharged home.  * Metastatic uroepithelial cancer   Manage per primary oncologist.  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  * Hypertension   Continue home medications.  * History of a DVT   On Eliquis, continue same.    All the records are reviewed and case discussed with ED provider. Management plans discussed with the patient, family and they are in agreement.  CODE STATUS: full code Code Status History    Date Active Date Inactive Code Status Order ID Comments User Context   12/03/2015  2:47 PM 12/04/2015  3:30 AM Full Code [id number]  Sabino Dick, MD HOV   09/25/2015  4:14 PM 09/30/2015  8:44 PM Full Code [id number]  Bettey Costa, MD Inpatient   06/04/2015  9:09 AM 06/05/2015  3:41 PM Full Code [id number]  Wellington Hampshire, MD Inpatient   06/01/2015  2:29 AM 06/02/2015  3:53 PM Full Code [id number]  Lytle Butte, MD ED   12/27/2013  5:25 PM 12/28/2013  9:57 PM Full Code [id number]  Pricilla Loveless, PA-C Inpatient     Wife and daughter were present in room, during my visit.  TOTAL TIME TAKING CARE OF THIS PATIENT: 60 critical care minutes.   Vaughan Basta M.D on 04/08/2016   Between 7am to 6pm - Pager - [phone]  After 6pm go to [email or web address] - password [email or web address] [facility]  Office  [phone]  CC: Primary care physician; Lelon Huh, MD   Note: This dictation was prepared with Dragon dictation along with smaller phrase technology. Any transcriptional errors that result from this process are unintentional.

## 2016-04-08 NOTE — ED Notes (Signed)
CBF 24; verified by repeat testing; pt given coke to drink.

## 2016-04-08 NOTE — ED Provider Notes (Signed)
[facility] Emergency Department Provider Note  ____________________________________________    I have reviewed the triage vital signs and the nursing notes.   HISTORY  Chief Complaint Hypoglycemia    HPI Louis Alexander is a 75 y.o. male who presents with hypoglycemia. EMS was called by wife because patient was unresponsive this morning. Reported agonal breathing upon arrival. EMS gave amp of D50 which increased his sugar. He arrived to the emergency department alert and oriented but his glucose had dropped to 24 again, we provided soft drinks to the patient. Patient denies fevers chills, cough, shortness of breath, dysuria. Patient is treated for urothelial cancer by Dr. Alyssa Grove. He had immunotherapy one week ago.     Past Medical History  Diagnosis Date  . Abnormal EKG   . Coronary artery disease   . Diabetes mellitus without complication ([place])   . Obesity   . Hyperlipidemia   . Hypertension   . Sleep apnea     could not tolerate cpap mask  . Arthritis     oa  . Cancer [facility])     bladder and prostate  . History of radiation therapy 2012  . History of chemotherapy 2014  . Bladder cancer ([place])   . History of chicken pox   . Myocardial infarction ([place]) 2008 and 2012    x 2  . Cancer ([place])     bladder, penis, bone, lung (mets)    Patient Active Problem List   Diagnosis Date Noted  . DVT (deep venous thrombosis) ([place]) 09/29/2015  . Hematuria 09/29/2015  . S/P IVC filter 09/29/2015  . Guaiac positive stools 09/29/2015  . Acute posthemorrhagic anemia 09/29/2015  . Blood transfusion during current hospitalization 09/29/2015  . 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 Hyponatremia 09/29/2015  . Acute renal failure ([place]) 09/29/2015  . Thrombocytopenia ([place]) 09/29/2015  . Pulmonary emboli ([place]) 09/25/2015  . Pulmonary embolism ([place]) 09/25/2015  . Atrial fibrillation ([place]) 09/13/2015  . Bladder cancer ([place])  06/11/2015  . Dyslipidemia 06/11/2015  . Arthralgia of hip 06/11/2015  . History of myocardial infarction 06/11/2015  . Degenerative arthritis of hip 06/11/2015  . Urothelial cancer ([place]) 03/16/2015  . S/P right THA, AA 12/27/2013  . Cancer of lateral wall of urinary bladder ([place]) 01/28/2013  . Malignant neoplasm of lateral wall of urinary bladder ([place]) 01/28/2013  . Prostate cancer ([place]) 01/14/2013  . Incomplete bladder emptying 01/14/2013  . Frank hematuria 01/14/2013  . Difficult or painful urination 01/14/2013  . Malignant neoplasm of prostate ([place]) 01/14/2013  . History of colon polyps 11/01/2010  . 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 Diabetes mellitus type 2, uncontrolled ([place]) 08/31/2008  . Diabetes mellitus with renal complications ([place]) [id number]  . Benign prostatic hypertrophy without urinary obstruction 07/31/2008  . Coronary artery disease 07/31/2008  . Disturbance of skin sensation 07/26/2008  . Essential (primary) hypertension 07/26/2008  . Idiopathic peripheral autonomic neuropathy 07/26/2008    Past Surgical History  Procedure Laterality Date  . Cardiac catheterization  12-06-2003  . Lad stent  2000 x1 stent, 2008 x 1 stent, 2012 x 1 stent  . Hernia repair  yrs ago  . Appendectomy  many yrs ago  . Rotator cuff repair Left yrs ago  . Seed implant to prostate with radiation  2012  . Protatectomy and urostomy  2014  . Total hip arthroplasty Right 12/27/2013    Procedure: RIGHT TOTAL HIP ARTHROPLASTY ANTERIOR APPROACH;  Surgeon: Pietro Cassis  Alvan Dame, MD;  Location: [facility];  Service: Orthopedics;  Laterality: Right;  . Cardiac catheterization N/A 06/04/2015    Procedure: Left Heart Cath and Coronary Angiography;  Surgeon: Corey Skains, MD;  Location: [place] CV LAB;  Service: Cardiovascular;  Laterality: N/A;  . Cardiac catheterization N/A 06/04/2015    Procedure: Coronary Stent Intervention;  Surgeon: Wellington Hampshire, MD;  Location: [place] CV LAB;  Service: Cardiovascular;  Laterality: N/A;  . Peripheral vascular catheterization N/A 09/25/2015    Procedure: IVC Filter Insertion;  Surgeon: Katha Cabal, MD;  Location: [place] CV LAB;  Service: Cardiovascular;  Laterality: N/A;    Current Outpatient Rx  Name  Route  Sig  Dispense  Refill  . apixaban (ELIQUIS) 5 MG TABS tablet   Oral   Take 5 mg by mouth 2 (two) times daily.         [name] aspirin EC 81 MG tablet   Oral   Take 81 mg by mouth daily.         [name] atenolol (TENORMIN) 25 MG tablet      take 1 TABLET, ORAL, DAILY   90 tablet   4     Rx has expired - unused refills remain   . Coenzyme Q10 10 MG capsule   Oral   Take 10 mg by mouth.         . furosemide (LASIX) 20 MG tablet   Oral   Take 20 mg by mouth 2 (two) times daily.         [name] glipiZIDE (GLUCOTROL) 10 MG tablet      TAKE 1 TABLET(S) BY MOUTH DAILY   30 tablet   12     CYCLE FILL MEDICATION. Authorization is required f ...   . megestrol (MEGACE ES) 625 MG/5ML suspension   Oral   Take 625 mg by mouth daily.          . metFORMIN (GLUCOPHAGE-XR) 500 MG 24 hr tablet      2 (TWO) TABLET, ORAL, TWO TIMES DAILY   120 tablet   11   . pantoprazole (PROTONIX) 40 MG tablet   Oral   Take 40 mg by mouth 2 (two) times daily.         . potassium chloride (K-DUR) 10 MEQ tablet   Oral   Take 10 mEq by mouth daily.         . sotalol (BETAPACE) 80 MG tablet   Oral   Take by mouth 2 (two) times daily.         [name] voriconazole (VFEND) 200 MG tablet   Oral   Take 200 mg by mouth 2 (two) times daily.         [name] ALPRAZolam (XANAX) 0.25 MG tablet   Oral   Take 1 tablet (0.25 mg total) by mouth 2 (two) times daily as needed for anxiety.   30 tablet   0   . fentaNYL (DURAGESIC - DOSED MCG/HR) 100 MCG/HR   Transdermal   Place 1 patch (100 mcg total) onto the skin every 3 (three) days.   10 patch   0   . ondansetron (ZOFRAN) 8 MG tablet   Oral   Take 1 tablet (8 mg  total) by mouth every 8 (eight) hours as needed for nausea or vomiting.   60 tablet   2   . prochlorperazine (COMPAZINE) 10 MG tablet      TAKE 1 TABLET BY MOUTH EVERY 6 HOURS  AS NEEDED   40 tablet   2     CYCLE FILL MEDICATION. Authorization is required f ...     Allergies Celebrex and Effient  Family History  Problem Relation Age of Onset  . Stroke Father 82  . Aneurysm Father   . Heart attack Mother   . Lung cancer Other   . Stomach cancer Other     Social History Social History  Substance Use Topics  . Smoking status: Never Smoker   . Smokeless tobacco: Never Used  . Alcohol Use: No    Review of Systems  Constitutional: Negative for fever. Eyes: Negative for redness ENT: Negative for sore throat Cardiovascular: Negative for chest pain Respiratory: Negative for shortness of breath. Gastrointestinal: Negative for abdominal pain Genitourinary: Negative for dysuria. Musculoskeletal: Negative for back pain. Skin: Negative for rash. Neurological: Negative for focal weakness Psychiatric: no anxiety    ____________________________________________   PHYSICAL EXAM:  VITAL SIGNS: ED Triage Vitals  Enc Vitals Group     BP 04/08/16 1236 154/70 mmHg     Pulse Rate 04/08/16 1236 44     Resp 04/08/16 1236 12     Temp 04/08/16 1236 97.5 F (36.4 C)     Temp Source 04/08/16 1236 Oral     SpO2 04/08/16 1236 99 %     Weight 04/08/16 1236 170 lb (77.111 kg)     Height 04/08/16 1236 5' 10.5" (1.791 m)     Head Cir --      Peak Flow --      Pain Score --      Pain Loc --      Pain Edu? --      Excl. in [place]? --      Constitutional: Alert and oriented. No acute distress   Eyes: Conjunctivae are normal. No erythema or injection ENT   Head: Normocephalic and atraumatic.   Mouth/Throat: Mucous membranes are moist. Cardiovascular: Normal rate, regular rhythm. Normal and symmetric distal pulses are present in the upper extremities. Right-sided chest  port Respiratory: Normal respiratory effort without tachypnea nor retractions. Breath sounds are clear and equal bilaterally.  Gastrointestinal: Soft and non-tender in all quadrants. No distention. There is no CVA tenderness. Genitourinary: deferred Musculoskeletal: Nontender with normal range of motion in all extremities.  Neurologic:  Normal speech and language. No gross focal neurologic deficits are appreciated. Skin:  Skin is warm, dry and intact. No rash noted. Psychiatric: Mood and affect are normal. Patient exhibits appropriate insight and judgment.  ____________________________________________    LABS (pertinent positives/negatives)  Labs Reviewed  GLUCOSE, CAPILLARY - Abnormal; Notable for the following:    Glucose-Capillary 24 (*)    All other components within normal limits  CBC WITH DIFFERENTIAL/PLATELET - Abnormal; Notable for the following:    WBC 11.7 (*)    RBC 4.07 (*)    Hemoglobin 11.3 (*)    HCT 34.9 (*)    RDW 16.4 (*)    Neutro Abs 8.9 (*)    All other components within normal limits  COMPREHENSIVE METABOLIC PANEL - Abnormal; Notable for the following:    Sodium 133 (*)    Potassium 5.4 (*)    Chloride 100 (*)    BUN 53 (*)    Creatinine, Ser 2.26 (*)    Albumin 3.2 (*)    Alkaline Phosphatase 166 (*)    GFR calc non Af Amer 27 (*)    GFR calc Af Amer 31 (*)    All other components  within normal limits  URINALYSIS COMPLETEWITH MICROSCOPIC ([facility] ONLY) - Abnormal; Notable for the following:    Color, Urine YELLOW (*)    APPearance CLOUDY (*)    Glucose, UA 50 (*)    Hgb urine dipstick 2+ (*)    Protein, ur 100 (*)    Leukocytes, UA 3+ (*)    Bacteria, UA FEW (*)    Squamous Epithelial / LPF 0-5 (*)    All other components within normal limits  LACTIC ACID, PLASMA - Abnormal; Notable for the following:    Lactic Acid, Venous 2.1 (*)    All other components within normal limits  TROPONIN I  GLUCOSE, CAPILLARY  LACTIC ACID, PLASMA     ____________________________________________   EKG  None  ____________________________________________    RADIOLOGY  Chest x-ray unremarkable  ____________________________________________   PROCEDURES  Procedure(s) performed: none  Critical Care performed: yes  CRITICAL CARE Performed by: Lavonia Drafts   Total critical care time: 30 minutes  Critical care time was exclusive of separately billable procedures and treating other patients.  Critical care was necessary to treat or prevent imminent or life-threatening deterioration.  Critical care was time spent personally by me on the following activities: development of treatment plan with patient and/or surrogate as well as nursing, discussions with consultants, evaluation of patient's response to treatment, examination of patient, obtaining history from patient or surrogate, ordering and performing treatments and interventions, ordering and review of laboratory studies, ordering and review of radiographic studies, pulse oximetry and re-evaluation of patient's condition.   ____________________________________________   INITIAL IMPRESSION / ASSESSMENT AND PLAN / ED COURSE  Pertinent labs & imaging results that were available during my care of the patient were reviewed by me and considered in my medical decision making (see chart for details).  Patient presents with hypoglycemia. Apparently ate normal dinner yesterday and took usual medications but unresponsive this morning. Given amp of D50 by EMS with rapid decrease in glucose As patient's glucose was 24 in the emergency department upon arrival. Patient given soft drinks with increase in glucose.  Discussed patient's lab results with Dr. Alyssa Grove the patient's oncologist  ----------------------------------------- 3:31 PM on 04/08/2016 -----------------------------------------  Patient alert and oriented but glucose is 14, we will give amp of D50. Patient  will require admission for close glucose checks and further workup. ____________________________________________   FINAL CLINICAL IMPRESSION(S) / ED DIAGNOSES  Final diagnoses:  Hypoglycemia          Lavonia Drafts, MD 04/08/16 [phone]

## 2016-04-08 NOTE — Progress Notes (Deleted)
Blood sugar taken at 1942, blood sugar is 108. Sugar overrided by Josetta Huddle, NT and verified with RN.

## 2016-04-08 NOTE — Progress Notes (Signed)
PHARMACIST - PHYSICIAN ORDER COMMUNICATION  CONCERNING: P&T Medication Policy on Herbal Medications  DESCRIPTION:  This patient's order for:  Co-Q 10  has been noted.  This product(s) is classified as an "herbal" or natural product. Due to a lack of definitive safety studies or FDA approval, nonstandard manufacturing practices, plus the potential risk of unknown drug-drug interactions while on inpatient medications, the Pharmacy and Therapeutics Committee does not permit the use of "herbal" or natural products of this type within [facility].   ACTION TAKEN: The pharmacy department is unable to verify this order at this time and your patient has been informed of this safety policy. Please reevaluate patient's clinical condition at discharge and address if the herbal or natural product(s) should be resumed at that time.  Darylene Price Shadman Tozzi 8:24 PM

## 2016-04-08 NOTE — ED Notes (Signed)
CBG 14; gave 50 mg D50 per Dr. Corky Downs verbal order; recheck CBG 47; D5 1/2 NS running at 100 ml per hour. Dr. Corky Downs notified.

## 2016-04-09 ENCOUNTER — Telehealth: Payer: Self-pay | Admitting: Family Medicine

## 2016-04-09 DIAGNOSIS — E43 Unspecified severe protein-calorie malnutrition: Secondary | ICD-10-CM | POA: Diagnosis present

## 2016-04-09 LAB — BASIC METABOLIC PANEL
ANION GAP: 6 (ref 5–15)
BUN: 37 mg/dL — AB (ref 6–20)
CHLORIDE: 103 mmol/L (ref 101–111)
CO2: 23 mmol/L (ref 22–32)
Calcium: 8.9 mg/dL (ref 8.9–10.3)
Creatinine, Ser: 1.72 mg/dL — ABNORMAL HIGH (ref 0.61–1.24)
GFR calc Af Amer: 44 mL/min — ABNORMAL LOW (ref >60)
GFR calc non Af Amer: 38 mL/min — ABNORMAL LOW (ref >60)
GLUCOSE: 189 mg/dL — AB (ref 65–99)
POTASSIUM: 5.6 mmol/L — AB (ref 3.5–5.1)
Sodium: 132 mmol/L — ABNORMAL LOW (ref 135–145)

## 2016-04-09 LAB — GLUCOSE, CAPILLARY
GLUCOSE-CAPILLARY: 190 mg/dL — AB (ref 65–99)
GLUCOSE-CAPILLARY: 190 mg/dL — AB (ref 65–99)
GLUCOSE-CAPILLARY: 174 mg/dL — AB (ref 65–99)
GLUCOSE-CAPILLARY: 181 mg/dL — AB (ref 65–99)
GLUCOSE-CAPILLARY: 222 mg/dL — AB (ref 65–99)
Glucose-Capillary: 176 mg/dL — ABNORMAL HIGH (ref 65–99)
Glucose-Capillary: 214 mg/dL — ABNORMAL HIGH (ref 65–99)

## 2016-04-09 LAB — CBC
HEMATOCRIT: 30.8 % — AB (ref 40.0–52.0)
HEMOGLOBIN: 10 g/dL — AB (ref 13.0–18.0)
MCH: 28.6 pg (ref 26.0–34.0)
MCHC: 32.7 g/dL (ref 32.0–36.0)
MCV: 87.4 fL (ref 80.0–100.0)
Platelets: 327 10*3/uL (ref 150–440)
RBC: 3.52 MIL/uL — ABNORMAL LOW (ref 4.40–5.90)
RDW: 16.4 % — AB (ref 11.5–14.5)
WBC: 11.3 10*3/uL — ABNORMAL HIGH (ref 3.8–10.6)

## 2016-04-09 LAB — LACTIC ACID, PLASMA: Lactic Acid, Venous: 2.1 mmol/L (ref 0.5–2.0)

## 2016-04-09 MED ORDER — GLIPIZIDE 5 MG PO TABS: 2.5000 mg | ORAL_TABLET | Freq: Every day | ORAL | Status: DC

## 2016-04-09 MED ORDER — ENSURE ENLIVE PO LIQD
237.0000 mL | Freq: Three times a day (TID) | ORAL | Status: AC
Start: 2016-04-09 — End: ?

## 2016-04-09 MED ORDER — SODIUM CHLORIDE 0.9% FLUSH: 10.0000 mL | INTRAVENOUS | Status: DC | PRN

## 2016-04-09 MED ORDER — HEPARIN SOD (PORK) LOCK FLUSH 100 UNIT/ML IV SOLN
500.0000 [IU] | Freq: Once | INTRAVENOUS | Status: AC
  Administered 2016-04-09: 13:00:00 500 [IU] via INTRAVENOUS
  Filled 2016-04-09: qty 5

## 2016-04-09 MED ORDER — ENSURE ENLIVE PO LIQD
237.0000 mL | Freq: Three times a day (TID) | ORAL | Status: DC
  Administered 2016-04-09: 14:00:00 237 mL via ORAL

## 2016-04-09 NOTE — Progress Notes (Signed)
Notified Dr. Darvin Neighbours that FSBS is 214 and potassium is 5.5. Per MD hold po potassium and discontinue IV fluids.

## 2016-04-09 NOTE — Telephone Encounter (Signed)
Pt is being discharged from [facility] today for Hypoglycemia.  I have scheduled a hospital follow up/MW

## 2016-04-09 NOTE — Progress Notes (Signed)
Patient reports that he feels "weak" when standing that improved with sitting, vital signs stable. Patient notes that he feels better after seating, volunteer called for discharge.

## 2016-04-09 NOTE — Discharge Instructions (Signed)
°  DIET:  Regular diet  DISCHARGE CONDITION:  Stable  ACTIVITY:  Activity as tolerated  OXYGEN:  Home Oxygen: No.   Oxygen Delivery: room air  DISCHARGE LOCATION:  home   If you experience worsening of your admission symptoms, develop shortness of breath, life threatening emergency, suicidal or homicidal thoughts you must seek medical attention immediately by calling 911 or calling your MD immediately  if symptoms less severe.  You Must read complete instructions/literature along with all the possible adverse reactions/side effects for all the Medicines you take and that have been prescribed to you. Take any new Medicines after you have completely understood and accpet all the possible adverse reactions/side effects.   Please note  You were cared for by a hospitalist during your hospital stay. If you have any questions about your discharge medications or the care you received while you were in the hospital after you are discharged, you can call the unit and asked to speak with the hospitalist on call if the hospitalist that took care of you is not available. Once you are discharged, your primary care physician will handle any further medical issues. Please note that NO REFILLS for any discharge medications will be authorized once you are discharged, as it is imperative that you return to your primary care physician (or establish a relationship with a primary care physician if you do not have one) for your aftercare needs so that they can reassess your need for medications and monitor your lab values.   Have reduced the dose a few glipizide from 10 mg to 2.5 mg daily. Start taking this medication if you notice her blood sugars to be greater than 250. Check blood sugars at least 3 times a day before meals and keep log. Take your doctor's appointment.

## 2016-04-09 NOTE — Progress Notes (Signed)
Pt is alert and oriented, denies pain, resting in bed, wife at bedside, fsbs elevated, IV fluids with dextrose discontinued, good appetite, on room air, pt is d/c to home with wife. Instructed to stop taking metformin and to only take glipizide when fsbs is greater than 250. Rx provided to patient. Pt reports understanding of d/c instructions and has no further questions at this time. F/u scheduled with pcp, pt verbalizes that he also has an appt to follow up with cancer center on 5/30. Portacath heparin locked and flushed prior to removal.

## 2016-04-09 NOTE — Progress Notes (Signed)
Per MD check FSBS after patient eats and he may discharge if fsbs is greater than 150. Verbal in person with sudini

## 2016-04-09 NOTE — Progress Notes (Addendum)
Inpatient Diabetes Program Recommendations  AACE/ADA: New Consensus Statement on Inpatient Glycemic Control (2015)  Target Ranges:  Prepandial:   less than 140 mg/dL      Peak postprandial:   less than 180 mg/dL (1-2 hours)      Critically ill patients:  140 - 180 mg/dL   Results for ESSIE, RUDA (MRN [id number]) as of 04/09/2016 13:15  Ref. Range 04/09/2016 05:48 04/09/2016 07:36 04/09/2016 10:24 04/09/2016 11:54 04/09/2016 13:13  Glucose-Capillary Latest Ref Range: 65-99 mg/dL 190 (H) 214 (H) 174 (H) 181 (H) 222 (H)   Review of Glycemic Control  Diabetes history: DM 2 Outpatient Diabetes medications: Metformin 1,000 mg BID, Glipizide 10 mg Daily Current orders for Inpatient glycemic control: None  Noted patient admitted with hypoglycemia  Inpatient Diabetes Program Recommendations: Correction (SSI): Patient is now off of D 5 gtt and has elevated glucose. Please consider starting CBGs and Novolog Sensitive (0-9 units) Correction ACHS.  Thanks,  Tama Headings RN, MSN, [facility] Inpatient Diabetes Coordinator Team Pager [phone] (8a-5p)

## 2016-04-09 NOTE — Progress Notes (Signed)
Initial Nutrition Assessment  DOCUMENTATION CODES:   Severe malnutrition in context of chronic illness  INTERVENTION:   Recommend liberalizing diet order to Regular to optimize nutritional intake. RD notes appetite stimulant ordered. Recommend Ensure Enlive po TID, each supplement provides 350 kcal and 20 grams of protein   NUTRITION DIAGNOSIS:   Malnutrition related to chronic illness as evidenced by energy intake < or equal to 75% for > or equal to 1 month, moderate depletion of body fat, severe depletion of muscle mass, percent weight loss.  GOAL:   Patient will meet greater than or equal to 90% of their needs  MONITOR:   PO intake, Supplement acceptance, Skin, Weight trends, Labs, I & O's  REASON FOR ASSESSMENT:   Malnutrition Screening Tool    ASSESSMENT:   Pt admitted with hypoglycemia. Pt with h/o bladder and prostate cancer with mets.  Past Medical History  Diagnosis Date  . Abnormal EKG   . Coronary artery disease   . Diabetes mellitus without complication ([place])   . Obesity   . Hyperlipidemia   . Hypertension   . Sleep apnea     could not tolerate cpap mask  . Arthritis     oa  . Cancer [facility])     bladder and prostate  . History of radiation therapy 2012  . History of chemotherapy 2014  . Bladder cancer ([place])   . History of chicken pox   . Myocardial infarction ([place]) 2008 and 2012    x 2  . Cancer ([place])     bladder, penis, bone, lung (mets)    Diet Order:  Diet regular Room service appropriate?: Yes; Fluid consistency:: Thin   Pt reports eating some eggs and toast this morning on visit.   Pt reports very poor po intake for months and that outpatient he was tried on Prednisone for appetite stimulant but did not have improvement and that just last week he was started on Megace. Pt reports for the past week he feels as though his appetite has finally improved. Pt reports prior to the past week eating very small amounts for 'months.' Pt reports trying  to drink supplements when able, likes vanilla. RD asked pt who makes his meals, to which pt replied, 'I do and my wife as well.'  Medications: Lasix, Megace, KCl, Protonix Labs: Na 132, K 5.6, glucose 189, 86, glucose noted to be 20 in ED on admission.   Gastrointestinal Profile: Last BM:  04/07/2016   Nutrition-Focused Physical Exam Findings: Nutrition-Focused physical exam completed. Findings are mild-moderate fat depletion, moderate-severe muscle depletion, and no edema.     Weight Change: Pt reports weight of 220lbs the first of the year (26% weight loss in 5 months). RD notes weight loss of 9% in the past 2 months per [facility] weight trends.   Skin:  Reviewed, no issues   Height:   Ht Readings from Last 1 Encounters:  04/08/16 5\' 11"  (1.803 m)    Weight:   Wt Readings from Last 1 Encounters:  04/08/16 162 lb 1.6 oz (73.528 kg)     BMI:  Body mass index is 22.62 kg/(m^2).  Estimated Nutritional Needs:   Kcal:  1980-2302kcals  Protein:  89-111g protein  Fluid:  >2L fluid  EDUCATION NEEDS:   No education needs identified at this time  Dwyane Luo, RD, LDN Pager [phone] Weekend/On-Call Pager [phone]

## 2016-04-10 NOTE — Telephone Encounter (Signed)
OK to stop metformin and follow up 5/31 as scheduled.

## 2016-04-10 NOTE — Telephone Encounter (Signed)
Pt called to advised that the doctor at [facility] took him off metFORMIN (GLUCOPHAGE-XR) 500 MG 24 hr tablet. Pt also stated that he would feel more comfortable coming in to see Dr. Caryn Section before his scheduled hospital follow up next Wednesday 04/16/16. Pt stated he just wanted Dr. Caryn Section to go over his hospital visit and make sure it's ok to have stopped the metFORMIN (GLUCOPHAGE-XR) 500 MG 24 hr tablet. Should I schedule pt sooner? If so, when? Please advise. Thanks TNP

## 2016-04-10 NOTE — Telephone Encounter (Signed)
Please review. Thanks!  

## 2016-04-10 NOTE — Telephone Encounter (Signed)
Tried calling patient. Left message to call back. 

## 2016-04-10 NOTE — Discharge Summary (Signed)
[facility] NAME: Louis Alexander    MR#:  [id number]  DATE OF BIRTH:  03-Sep-1941  DATE OF ADMISSION:  04/08/2016 ADMITTING PHYSICIAN: Vaughan Basta, MD  DATE OF DISCHARGE: 04/09/2016  2:09 PM  PRIMARY CARE PHYSICIAN: Lelon Huh, MD   ADMISSION DIAGNOSIS:  Hypoglycemia [E16.2]  DISCHARGE DIAGNOSIS:  Principal Problem:   Hypoglycemia Active Problems:   Protein-calorie malnutrition, severe   SECONDARY DIAGNOSIS:   Past Medical History  Diagnosis Date  . Abnormal EKG   . Coronary artery disease   . Diabetes mellitus without complication ([place])   . Obesity   . Hyperlipidemia   . Hypertension   . Sleep apnea     could not tolerate cpap mask  . Arthritis     oa  . Cancer [facility])     bladder and prostate  . History of radiation therapy 2012  . History of chemotherapy 2014  . Bladder cancer ([place])   . History of chicken pox   . Myocardial infarction ([place]) 2008 and 2012    x 2  . Cancer ([place])     bladder, penis, bone, lung (m[place])     ADMITTING HISTORY  HISTORY OF PRESENT ILLNESS: Louis Alexander is a 75 y.o. male with a known history of Coronary artery disease, diabetes, obesity, hyperlipidemia, hypertension, arthritis, bladder and prostate cancer with metastasis- for last few months has decreased eating, and was given megestrol . But as per family for last 1-2 weeks he has been eating good compared to what he was eating before. There is no recent change in his diabetic medications. He had good thenar last night, and patient denies taking any extra dose of his medicines in last 1 or 2 days. Today morning he did not wake up and wife for shaking him but he was not arousable so she called 911 and when they arrived his blood sugar level was 14, after injection dextrose it came up but again while transported to emergency room his blood sugar was again less than 20 twice while in the emergency room.. Social  history started on dextrose IV drip and given to hospitalist team for further management. Now during my visit to his room he is on dextrose drip and blood sugar is running normal and patient is completely alert and oriented. He denies any extremity was of his medication and any suicidal ideation.  HOSPITAL COURSE:   * Severe hypoglycemia Due to poor oral intake. Stopped and metformin and lowered glipizide. Check BS daily and keep log. Take to doctor's office.  * Metastatic uroepithelial cancer  Manage per primary oncologist.  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  * Hypertension  Continue home medications.  * History of a DVT  On Eliquis, continue same.  Stable for discharge home.  Follow-up with PCP and oncology.  CONSULTS OBTAINED:     DRUG ALLERGIES:   Allergies  Allergen Reactions  . Celebrex [Celecoxib] Other (See Comments)    Hematuria Other reaction(s): Bleeding Bleeding in bladder  . Effient [Prasugrel] Other (See Comments)    blood clots, blood in urine Other reaction(s): Bleeding Bleeding in bladder    DISCHARGE MEDICATIONS:   Discharge Medication List as of 04/09/2016 12:51 PM    START taking these medications   Details  feeding supplement, ENSURE ENLIVE, (ENSURE ENLIVE) LIQD Take 237 mLs by mouth 3 (three) times daily between meals.,  Starting 04/09/2016, Until Discontinued, Print      CONTINUE these medications which have CHANGED   Details  glipiZIDE (GLUCOTROL) 5 MG tablet Take 0.5 tablets (2.5 mg total) by mouth daily before breakfast., Starting 04/09/2016, Until Discontinued, Print      CONTINUE these medications which have NOT CHANGED   Details  apixaban (ELIQUIS) 5 MG TABS tablet Take 5 mg by mouth 2 (two) times daily., Until Discontinued, Historical Med    aspirin EC 81 MG tablet Take 81 mg by mouth daily., Until Discontinued, Historical Med    atenolol  (TENORMIN) 25 MG tablet take 1 TABLET, ORAL, DAILY, Normal    Coenzyme Q10 10 MG capsule Take 10 mg by mouth., Until Discontinued, Historical Med    furosemide (LASIX) 20 MG tablet Take 20 mg by mouth 2 (two) times daily., Until Discontinued, Historical Med    megestrol (MEGACE ES) 625 MG/5ML suspension Take 625 mg by mouth daily. , Starting 04/02/2016, Until Discontinued, Historical Med    pantoprazole (PROTONIX) 40 MG tablet Take 40 mg by mouth 2 (two) times daily., Until Discontinued, Historical Med    potassium chloride (K-DUR) 10 MEQ tablet Take 10 mEq by mouth daily., Until Discontinued, Historical Med    sotalol (BETAPACE) 80 MG tablet Take by mouth 2 (two) times daily., Until Discontinued, Historical Med    voriconazole (VFEND) 200 MG tablet Take 200 mg by mouth 2 (two) times daily., Until Discontinued, Historical Med    ALPRAZolam (XANAX) 0.25 MG tablet Take 1 tablet (0.25 mg total) by mouth 2 (two) times daily as needed for anxiety., Starting 01/29/2016, Until Discontinued, Print    fentaNYL (DURAGESIC - DOSED MCG/HR) 100 MCG/HR Place 1 patch (100 mcg total) onto the skin every 3 (three) days., Starting 03/18/2016, Until Discontinued, Print    ondansetron (ZOFRAN) 8 MG tablet Take 1 tablet (8 mg total) by mouth every 8 (eight) hours as needed for nausea or vomiting., Starting 01/01/2016, Until Discontinued, Normal    prochlorperazine (COMPAZINE) 10 MG tablet TAKE 1 TABLET BY MOUTH EVERY 6 HOURS AS NEEDED, Normal      STOP taking these medications     metFORMIN (GLUCOPHAGE-XR) 500 MG 24 hr tablet         Today   VITAL SIGNS:  Blood pressure 144/70, pulse 75, temperature 98.5 F (36.9 C), temperature source Oral, resp. rate 17, height 5\' 11"  (1.803 m), weight 73.528 kg (162 lb 1.6 oz), SpO2 98 %.  I/O:  No intake or output data in the 24 hours ending 04/10/16 1629  PHYSICAL EXAMINATION:  Physical Exam  GENERAL:  74 y.o.-year-old patient lying in the bed with no acute  distress.  LUNGS: Normal breath sounds bilaterally, no wheezing, rales,rhonchi or crepitation. No use of accessory muscles of respiration.  CARDIOVASCULAR: S1, S2 normal. No murmurs, rubs, or gallops.  ABDOMEN: Soft, non-tender, non-distended. Bowel sounds present. No organomegaly or mass. Urostomy bag in place NEUROLOGIC: Moves all 4 extremities. PSYCHIATRIC: The patient is alert and oriented x 3.   DATA REVIEW:   CBC  Recent Labs Lab 04/09/16 0449  WBC 11.3*  HGB 10.0*  HCT 30.8*  PLT 327    Chemistries   Recent Labs Lab 04/08/16 1307 04/09/16 0449  NA 133* 132*  K 5.4* 5.6*  CL 100* 103  CO2 25 23  GLUCOSE 86 189*  BUN 53* 37*  CREATININE 2.26* 1.72*  CALCIUM 9.5 8.9  AST 26  --   ALT 21  --   ALKPHOS 166*  --  BILITOT 0.4  --     Cardiac Enzymes  Recent Labs Lab 04/08/16 1307  TROPONINI <0.03    Microbiology Results  Results for orders placed or performed during the hospital encounter of 12/21/13  Surgical pcr screen     Status: None   Collection Time: 12/21/13  8:21 AM  Result Value Ref Range Status   MRSA, PCR NEGATIVE NEGATIVE Final   Staphylococcus aureus NEGATIVE NEGATIVE Final    Comment:        The Xpert SA Assay (FDA approved for NASAL specimens in patients over 35 years of age), is one component of a comprehensive surveillance program.  Test performance has been validated by [employer] for patients greater than or equal to 46 year old. It is not intended to diagnose infection nor to guide or monitor treatment.  Urine culture     Status: None   Collection Time: 12/21/13  8:21 AM  Result Value Ref Range Status   Specimen Description URINE, CLEAN CATCH  Final   Special Requests NONE  Final   Culture  Setup Time   Final    12/21/2013 13:30 Performed at [facility]   Final    >=100,000 COLONIES/ML Performed at [facility]   Culture   Final    PSEUDOMONAS AERUGINOSA KLEBSIELLA  PNEUMONIAE Performed at [facility]   Report Status 12/24/2013 FINAL  Final   Organism ID, Bacteria PSEUDOMONAS AERUGINOSA  Final   Organism ID, Bacteria KLEBSIELLA PNEUMONIAE  Final      Susceptibility   Klebsiella pneumoniae - MIC*    AMPICILLIN >=32 RESISTANT Resistant     CEFAZOLIN <=4 SENSITIVE Sensitive     CEFTRIAXONE <=1 SENSITIVE Sensitive     CIPROFLOXACIN <=0.25 SENSITIVE Sensitive     GENTAMICIN <=1 SENSITIVE Sensitive     LEVOFLOXACIN <=0.12 SENSITIVE Sensitive     NITROFURANTOIN 128 RESISTANT Resistant     TOBRAMYCIN <=1 SENSITIVE Sensitive     TRIMETH/SULFA <=20 SENSITIVE Sensitive     PIP/TAZO <=4 SENSITIVE Sensitive     * KLEBSIELLA PNEUMONIAE   Pseudomonas aeruginosa - MIC*    CEFEPIME 2 SENSITIVE Sensitive     CEFTAZIDIME 4 SENSITIVE Sensitive     CIPROFLOXACIN <=0.25 SENSITIVE Sensitive     GENTAMICIN <=1 SENSITIVE Sensitive     IMIPENEM 2 SENSITIVE Sensitive     PIP/TAZO 8 SENSITIVE Sensitive     TOBRAMYCIN* <=1 SENSITIVE Sensitive      * SET UP TIME:  [id number]    * PSEUDOMONAS AERUGINOSA    RADIOLOGY:  No results found.  Follow up with PCP in 1 week.  Management plans discussed with the patient, family and they are in agreement.  CODE STATUS:  Code Status History    Date Active Date Inactive Code Status Order ID Comments User Context   04/08/2016  7:41 PM 04/09/2016  5:10 PM Full Code [id number]  Vaughan Basta, MD Inpatient   12/03/2015  2:47 PM 12/04/2015  3:30 AM Full Code [id number]  Sabino Dick, MD HOV   09/25/2015  4:14 PM 09/30/2015  8:44 PM Full Code [id number]  Bettey Costa, MD Inpatient   06/04/2015  9:09 AM 06/05/2015  3:41 PM Full Code [id number]  Wellington Hampshire, MD Inpatient   06/01/2015  2:29 AM 06/02/2015  3:53 PM Full Code [id number]  Lytle Butte, MD ED   12/27/2013  5:25 PM 12/28/2013  9:57 PM Full Code [id number]  Pricilla Loveless, PA-C Inpatient  TOTAL TIME TAKING CARE OF THIS PATIENT ON DAY OF DISCHARGE: more than  30 minutes.   Hillary Bow R M.D on 04/10/2016 at 4:29 PM  Between 7am to 6pm - Pager - [phone]  After 6pm go to [email or web address] - password [email or web address] [facility]  Office  [phone]  CC: Primary care physician; Lelon Huh, MD  Note: This dictation was prepared with Dragon dictation along with smaller phrase technology. Any transcriptional errors that result from this process are unintentional.

## 2016-04-11 ENCOUNTER — Encounter: Payer: Self-pay | Admitting: Family Medicine

## 2016-04-11 ENCOUNTER — Ambulatory Visit (INDEPENDENT_AMBULATORY_CARE_PROVIDER_SITE_OTHER): Payer: PPO | Admitting: Family Medicine

## 2016-04-11 ENCOUNTER — Other Ambulatory Visit: Payer: Self-pay | Admitting: Family Medicine

## 2016-04-11 VITALS — BP 116/60 | HR 58 | Temp 98.3°F | Resp 18 | Wt 172.0 lb

## 2016-04-11 DIAGNOSIS — E162 Hypoglycemia, unspecified: Secondary | ICD-10-CM | POA: Diagnosis not present

## 2016-04-11 MED ORDER — METFORMIN HCL ER 500 MG PO TB24: 500.0000 mg | ORAL_TABLET | Freq: Every day | ORAL | Status: DC

## 2016-04-11 MED ORDER — GLIPIZIDE 10 MG PO TABS: 5.0000 mg | ORAL_TABLET | Freq: Every day | ORAL | Status: DC

## 2016-04-11 NOTE — Telephone Encounter (Signed)
Patient returned call and spoke with Chrys Racer. Patient is concerned about his sugar running too high. He states yesterday his sugar was 332 and this morning it was 106. Patient schedueld an appointment to come in today at 2:45pm.

## 2016-04-11 NOTE — Progress Notes (Signed)
Patient: Louis Alexander Male    DOB: 1941-05-13   75 y.o.   MRN: [id number] Visit Date: 04/11/2016  Today's Provider: Lelon Huh, MD   Chief Complaint  Patient presents with  . Hospitalization Follow-up   Subjective:    HPI  Follow up Hospitalization  Patient was admitted to [facility] on 04/08/2016 and discharged on 04/09/2016. He was treated for Hypoglycemia. His wife states that in the morning of 5/23 she let him sleep in, but when she tried to wake him at 10am he was unresponsive. EMS arrived and his blood sugar was 10 and was started on IV glucose. He aroused soon after arriving to ED. He was kept overnight and had otherwise negative work up. He hadn't taking glipizide or metformin on the date of incident. He took metformin the night before, but he ate large ice cream Sunday and had large carbo-load, despite otherwise poor appetite.  Treatment for this included having patient stop Metformin. Discharge plan was to reduce glipized to 2.5mg , but his wife states she was unaware of change and he is still taking 10mg  in the morning. he was felt fairly well since discharge.  Patient was advised to check blood sugars daily and keep a log. Per Discharge summary, patient was told to follow up with his PCP in 1 week.  He reports good compliance with treatment. He reports this condition is Improved. Patient states his blood sugars are now running too high. Yesterday his sugar was 332 and this morning it was 106. Patient's last glucose check was today at 1:30pm and it was 224.   ------------------------------------------------------------------------------------     Allergies  Allergen Reactions  . Celebrex [Celecoxib] Other (See Comments)    Hematuria Other reaction(s): Bleeding Bleeding in bladder  . Effient [Prasugrel] Other (See Comments)    blood clots, blood in urine Other reaction(s): Bleeding Bleeding in bladder   Previous Medications   ALPRAZOLAM (XANAX) 0.25 MG  TABLET    Take 1 tablet (0.25 mg total) by mouth 2 (two) times daily as needed for anxiety.   APIXABAN (ELIQUIS) 5 MG TABS TABLET    Take 5 mg by mouth 2 (two) times daily.   ASPIRIN EC 81 MG TABLET    Take 81 mg by mouth daily.   ATENOLOL (TENORMIN) 25 MG TABLET    take 1 TABLET, ORAL, DAILY   COENZYME Q10 10 MG CAPSULE    Take 10 mg by mouth.   FEEDING SUPPLEMENT, ENSURE ENLIVE, (ENSURE ENLIVE) LIQD    Take 237 mLs by mouth 3 (three) times daily between meals.   FENTANYL (DURAGESIC - DOSED MCG/HR) 100 MCG/HR    Place 1 patch (100 mcg total) onto the skin every 3 (three) days.   FUROSEMIDE (LASIX) 20 MG TABLET    Take 20 mg by mouth 2 (two) times daily.   GLIPIZIDE (GLUCOTROL) 5 MG TABLET    Take 0.5 tablets (2.5 mg total) by mouth daily before breakfast.   MEGESTROL (MEGACE ES) 625 MG/5ML SUSPENSION    Take 625 mg by mouth daily.    ONDANSETRON (ZOFRAN) 8 MG TABLET    Take 1 tablet (8 mg total) by mouth every 8 (eight) hours as needed for nausea or vomiting.   PANTOPRAZOLE (PROTONIX) 40 MG TABLET    Take 40 mg by mouth 2 (two) times daily.   POTASSIUM CHLORIDE (K-DUR) 10 MEQ TABLET    Take 10 mEq by mouth daily.   PROCHLORPERAZINE (COMPAZINE) 10 MG TABLET  TAKE 1 TABLET BY MOUTH EVERY 6 HOURS AS NEEDED   SOTALOL (BETAPACE) 80 MG TABLET    Take by mouth 2 (two) times daily.   VORICONAZOLE (VFEND) 200 MG TABLET    Take 200 mg by mouth 2 (two) times daily.    Review of Systems  Constitutional: Negative for fever, chills and appetite change.  Eyes: Negative for visual disturbance.  Respiratory: Negative for chest tightness, shortness of breath and wheezing.   Cardiovascular: Negative for chest pain and palpitations.  Gastrointestinal: Negative for nausea, vomiting and abdominal pain.  Endocrine: Negative for polydipsia, polyphagia and polyuria.  Neurological: Positive for weakness.    Social History  Substance Use Topics  . Smoking status: Never Smoker   . Smokeless tobacco: Never Used   . Alcohol Use: No   Objective:   BP 116/60 mmHg  Pulse 58  Temp(Src) 98.3 F (36.8 C) (Oral)  Resp 18  Wt 172 lb (78.019 kg)  SpO2 97%  Physical Exam   General Appearance:    Alert, cooperative, no distress  Eyes:    PERRL, conjunctiva/corneas clear, EOM's intact       Lungs:     Clear to auscultation bilaterally, respirations unlabored  Heart:    Regular rate and rhythm  Neurologic:   Awake, alert, oriented x 3. No apparent focal neurological           defect.           Assessment & Plan:     1. Hypoglycemia Unclear why sugars dropped so drastically. He states he had some chills the evening before, but had negative ID workup in the hospital. Possibly unusually elevated insulin levels from high carbohydrate intake the night before. Nevertheless, sugars have been higher than usual since stopping metformin. Will restart 500 QD instead of BID, and reduce glipizide from 10 to 5 a day. Call if sugars consistently in the 300s, otherwise follow up 1 month.        Lelon Huh, MD  [facility]

## 2016-04-15 ENCOUNTER — Inpatient Hospital Stay: Payer: PPO

## 2016-04-15 ENCOUNTER — Ambulatory Visit: Payer: PPO | Admitting: Family Medicine

## 2016-04-15 ENCOUNTER — Inpatient Hospital Stay (HOSPITAL_BASED_OUTPATIENT_CLINIC_OR_DEPARTMENT_OTHER): Payer: PPO | Admitting: Oncology

## 2016-04-15 VITALS — BP 128/80 | HR 61 | Temp 98.0°F | Resp 16 | Wt 166.9 lb

## 2016-04-15 DIAGNOSIS — R112 Nausea with vomiting, unspecified: Secondary | ICD-10-CM

## 2016-04-15 DIAGNOSIS — I10 Essential (primary) hypertension: Secondary | ICD-10-CM

## 2016-04-15 DIAGNOSIS — R918 Other nonspecific abnormal finding of lung field: Secondary | ICD-10-CM

## 2016-04-15 DIAGNOSIS — I252 Old myocardial infarction: Secondary | ICD-10-CM

## 2016-04-15 DIAGNOSIS — F419 Anxiety disorder, unspecified: Secondary | ICD-10-CM

## 2016-04-15 DIAGNOSIS — M129 Arthropathy, unspecified: Secondary | ICD-10-CM | POA: Diagnosis not present

## 2016-04-15 DIAGNOSIS — Z8551 Personal history of malignant neoplasm of bladder: Secondary | ICD-10-CM

## 2016-04-15 DIAGNOSIS — Z7901 Long term (current) use of anticoagulants: Secondary | ICD-10-CM | POA: Diagnosis not present

## 2016-04-15 DIAGNOSIS — Z5112 Encounter for antineoplastic immunotherapy: Secondary | ICD-10-CM | POA: Diagnosis not present

## 2016-04-15 DIAGNOSIS — Z7982 Long term (current) use of aspirin: Secondary | ICD-10-CM | POA: Diagnosis not present

## 2016-04-15 DIAGNOSIS — G4730 Sleep apnea, unspecified: Secondary | ICD-10-CM | POA: Diagnosis not present

## 2016-04-15 DIAGNOSIS — E669 Obesity, unspecified: Secondary | ICD-10-CM | POA: Diagnosis not present

## 2016-04-15 DIAGNOSIS — E1165 Type 2 diabetes mellitus with hyperglycemia: Secondary | ICD-10-CM | POA: Diagnosis not present

## 2016-04-15 DIAGNOSIS — R531 Weakness: Secondary | ICD-10-CM

## 2016-04-15 DIAGNOSIS — Z923 Personal history of irradiation: Secondary | ICD-10-CM | POA: Diagnosis not present

## 2016-04-15 DIAGNOSIS — D649 Anemia, unspecified: Secondary | ICD-10-CM

## 2016-04-15 DIAGNOSIS — R630 Anorexia: Secondary | ICD-10-CM

## 2016-04-15 DIAGNOSIS — C679 Malignant neoplasm of bladder, unspecified: Secondary | ICD-10-CM

## 2016-04-15 DIAGNOSIS — R5383 Other fatigue: Secondary | ICD-10-CM

## 2016-04-15 DIAGNOSIS — K5900 Constipation, unspecified: Secondary | ICD-10-CM | POA: Diagnosis not present

## 2016-04-15 DIAGNOSIS — Z7984 Long term (current) use of oral hypoglycemic drugs: Secondary | ICD-10-CM

## 2016-04-15 DIAGNOSIS — Z8546 Personal history of malignant neoplasm of prostate: Secondary | ICD-10-CM

## 2016-04-15 DIAGNOSIS — I2510 Atherosclerotic heart disease of native coronary artery without angina pectoris: Secondary | ICD-10-CM

## 2016-04-15 DIAGNOSIS — Z79899 Other long term (current) drug therapy: Secondary | ICD-10-CM | POA: Diagnosis not present

## 2016-04-15 DIAGNOSIS — R634 Abnormal weight loss: Secondary | ICD-10-CM

## 2016-04-15 DIAGNOSIS — I4891 Unspecified atrial fibrillation: Secondary | ICD-10-CM

## 2016-04-15 DIAGNOSIS — R944 Abnormal results of kidney function studies: Secondary | ICD-10-CM

## 2016-04-15 DIAGNOSIS — C787 Secondary malignant neoplasm of liver and intrahepatic bile duct: Secondary | ICD-10-CM | POA: Diagnosis not present

## 2016-04-15 DIAGNOSIS — E785 Hyperlipidemia, unspecified: Secondary | ICD-10-CM | POA: Diagnosis not present

## 2016-04-15 DIAGNOSIS — Z9221 Personal history of antineoplastic chemotherapy: Secondary | ICD-10-CM | POA: Diagnosis not present

## 2016-04-15 DIAGNOSIS — C689 Malignant neoplasm of urinary organ, unspecified: Secondary | ICD-10-CM

## 2016-04-15 LAB — COMPREHENSIVE METABOLIC PANEL
ALBUMIN: 3.4 g/dL — AB (ref 3.5–5.0)
ALT: 18 U/L (ref 17–63)
ANION GAP: 11 (ref 5–15)
AST: 20 U/L (ref 15–41)
Alkaline Phosphatase: 120 U/L (ref 38–126)
BUN: 51 mg/dL — ABNORMAL HIGH (ref 6–20)
CALCIUM: 9.3 mg/dL (ref 8.9–10.3)
CO2: 20 mmol/L — ABNORMAL LOW (ref 22–32)
CREATININE: 1.87 mg/dL — AB (ref 0.61–1.24)
Chloride: 97 mmol/L — ABNORMAL LOW (ref 101–111)
GFR, EST AFRICAN AMERICAN: 39 mL/min — AB (ref >60)
GFR, EST NON AFRICAN AMERICAN: 34 mL/min — AB (ref >60)
Glucose, Bld: 273 mg/dL — ABNORMAL HIGH (ref 65–99)
Potassium: 5.1 mmol/L (ref 3.5–5.1)
SODIUM: 128 mmol/L — AB (ref 135–145)
TOTAL PROTEIN: 6.9 g/dL (ref 6.5–8.1)
Total Bilirubin: 0.6 mg/dL (ref 0.3–1.2)

## 2016-04-15 LAB — CBC WITH DIFFERENTIAL/PLATELET
BASOS ABS: 0.1 10*3/uL (ref 0–0.1)
BASOS PCT: 1 %
EOS ABS: 0.1 10*3/uL (ref 0–0.7)
EOS PCT: 1 %
HCT: 31 % — ABNORMAL LOW (ref 40.0–52.0)
Hemoglobin: 10.4 g/dL — ABNORMAL LOW (ref 13.0–18.0)
LYMPHS PCT: 13 %
Lymphs Abs: 2.3 10*3/uL (ref 1.0–3.6)
MCH: 28.6 pg (ref 26.0–34.0)
MCHC: 33.5 g/dL (ref 32.0–36.0)
MCV: 85.2 fL (ref 80.0–100.0)
Monocytes Absolute: 1.5 10*3/uL — ABNORMAL HIGH (ref 0.2–1.0)
Monocytes Relative: 8 %
Neutro Abs: 14.2 10*3/uL — ABNORMAL HIGH (ref 1.4–6.5)
Neutrophils Relative %: 77 %
PLATELETS: 390 10*3/uL (ref 150–440)
RBC: 3.64 MIL/uL — AB (ref 4.40–5.90)
RDW: 16.9 % — ABNORMAL HIGH (ref 11.5–14.5)
WBC: 18.2 10*3/uL — AB (ref 3.8–10.6)

## 2016-04-15 MED ORDER — SODIUM CHLORIDE 0.9 % IV SOLN
Freq: Once | INTRAVENOUS | Status: AC
  Administered 2016-04-15: 12:00:00 via INTRAVENOUS
  Filled 2016-04-15: qty 1000

## 2016-04-15 MED ORDER — HEPARIN SOD (PORK) LOCK FLUSH 100 UNIT/ML IV SOLN
500.0000 [IU] | Freq: Once | INTRAVENOUS | Status: AC | PRN
  Administered 2016-04-15: 500 [IU]

## 2016-04-15 MED ORDER — SODIUM CHLORIDE 0.9 % IV SOLN
240.0000 mg | Freq: Once | INTRAVENOUS | Status: AC
  Administered 2016-04-15: 240 mg via INTRAVENOUS
  Filled 2016-04-15: qty 24

## 2016-04-15 MED ORDER — HEPARIN SOD (PORK) LOCK FLUSH 100 UNIT/ML IV SOLN
INTRAVENOUS | Status: AC
  Filled 2016-04-15: qty 5

## 2016-04-15 NOTE — Progress Notes (Signed)
Patient had an episode of losing consciousness due to hypoglycemia and they had to call EMS to help raise his blood sugar.

## 2016-04-16 ENCOUNTER — Inpatient Hospital Stay: Payer: PPO | Admitting: Family Medicine

## 2016-04-16 NOTE — Progress Notes (Signed)
[place]  [phone]) [phone] Fax:(336) [phone]  ID: Louis Alexander OB: Aug 20, 1941  MR#: [id number]  [id number]  Patient Care Team: Birdie Sons, MD as PCP - General (Family Medicine) Murrell Redden, MD (Urology) Dionisio David, MD as Consulting Physician (Cardiology) Lloyd Huger, MD as Consulting Physician (Oncology)  CHIEF COMPLAINT:  Chief Complaint  Patient presents with  . urothelial cancer    INTERVAL HISTORY: Patient returns to clinic for further evaluation and consideration of cycle 6 of nivolumab. He Was recently in the hospital for an episode of unresponsiveness secondary to hypoglycemia. His medications have been adjusted by his primary care physician. He continues to have increased weakness and fatigue. He continues to have pain, but it is better controlled today. He continues to have a poor appetite and decreased PO intake. He does not complain of chest pain or shortness of breath. He has no neurologic complaints. Patient denies any fevers. He denies any diarrhea but he has had occasional constipation but it is controlled with miralax.  He offers no further specific complaints today.  REVIEW OF SYSTEMS:   Review of Systems  Constitutional: Positive for weight loss and malaise/fatigue. Negative for fever.  Respiratory: Negative.  Negative for shortness of breath.   Cardiovascular: Negative.  Negative for chest pain.  Gastrointestinal: Positive for nausea, vomiting and constipation. Negative for abdominal pain.  Genitourinary: Negative.   Musculoskeletal: Negative.   Neurological: Positive for weakness.  Psychiatric/Behavioral: The patient is nervous/anxious.     As per HPI. Otherwise, a complete review of systems is negatve.  PAST MEDICAL HISTORY: Past Medical History  Diagnosis Date  . Abnormal EKG   . Coronary artery disease   . Diabetes mellitus without complication ([place])   . Obesity   . Hyperlipidemia   .  Hypertension   . Sleep apnea     could not tolerate cpap mask  . Arthritis     oa  . Cancer [facility])     bladder and prostate  . History of radiation therapy 2012  . History of chemotherapy 2014  . Bladder cancer ([place])   . History of chicken pox   . Myocardial infarction ([place]) 2008 and 2012    x 2  . Cancer ([place])     bladder, penis, bone, lung (mets)    PAST SURGICAL HISTORY: Past Surgical History  Procedure Laterality Date  . Cardiac catheterization  12-06-2003  . Lad stent  2000 x1 stent, 2008 x 1 stent, 2012 x 1 stent  . Hernia repair  yrs ago  . Appendectomy  many yrs ago  . Rotator cuff repair Left yrs ago  . Seed implant to prostate with radiation  2012  . Protatectomy and urostomy  2014  . Total hip arthroplasty Right 12/27/2013    Procedure: RIGHT TOTAL HIP ARTHROPLASTY ANTERIOR APPROACH;  Surgeon: Mauri Pole, MD;  Location: [facility];  Service: Orthopedics;  Laterality: Right;  . Cardiac catheterization N/A 06/04/2015    Procedure: Left Heart Cath and Coronary Angiography;  Surgeon: Corey Skains, MD;  Location: [place] [facility];  Service: Cardiovascular;  Laterality: N/A;  . Cardiac catheterization N/A 06/04/2015    Procedure: Coronary Stent Intervention;  Surgeon: Wellington Hampshire, MD;  Location: [place] CV LAB;  Service: Cardiovascular;  Laterality: N/A;  . Peripheral vascular catheterization N/A 09/25/2015    Procedure: IVC Filter Insertion;  Surgeon: Katha Cabal, MD;  Location: [place] CV LAB;  Service: Cardiovascular;  Laterality:  N/A;    FAMILY HISTORY: Reviewed and unchanged. No report of malignancy or chronic disease.     ADVANCED DIRECTIVES:    HEALTH MAINTENANCE: Social History  Substance Use Topics  . Smoking status: Never Smoker   . Smokeless tobacco: Never Used  . Alcohol Use: No     Allergies  Allergen Reactions  . Celebrex [Celecoxib] Other (See Comments)    Hematuria Other reaction(s): Bleeding Bleeding in bladder    . Effient [Prasugrel] Other (See Comments)    blood clots, blood in urine Other reaction(s): Bleeding Bleeding in bladder    Current Outpatient Prescriptions  Medication Sig Dispense Refill  . ALPRAZolam (XANAX) 0.25 MG tablet Take 1 tablet (0.25 mg total) by mouth 2 (two) times daily as needed for anxiety. 30 tablet 0  . apixaban (ELIQUIS) 5 MG TABS tablet Take 5 mg by mouth 2 (two) times daily.    [name] aspirin EC 81 MG tablet Take 81 mg by mouth daily.    [name] atenolol (TENORMIN) 25 MG tablet TAKE 1 TABLET(S) BY MOUTH DAILY 90 tablet 3  . Coenzyme Q10 10 MG capsule Take 10 mg by mouth.    . feeding supplement, ENSURE ENLIVE, (ENSURE ENLIVE) LIQD Take 237 mLs by mouth 3 (three) times daily between meals. 90 Bottle 0  . fentaNYL (DURAGESIC - DOSED MCG/HR) 100 MCG/HR Place 1 patch (100 mcg total) onto the skin every 3 (three) days. 10 patch 0  . furosemide (LASIX) 20 MG tablet Take 20 mg by mouth 2 (two) times daily.    [name] glipiZIDE (GLUCOTROL) 10 MG tablet Take 0.5 tablets (5 mg total) by mouth daily before breakfast. 1 tablet 1  . megestrol (MEGACE ES) 625 MG/5ML suspension Take 625 mg by mouth daily.     . metFORMIN (GLUCOPHAGE-XR) 500 MG 24 hr tablet Take 1 tablet (500 mg total) by mouth daily. 1 tablet 0  . ondansetron (ZOFRAN) 8 MG tablet Take 1 tablet (8 mg total) by mouth every 8 (eight) hours as needed for nausea or vomiting. 60 tablet 2  . pantoprazole (PROTONIX) 40 MG tablet Take 40 mg by mouth 2 (two) times daily.    . potassium chloride (K-DUR) 10 MEQ tablet Take 10 mEq by mouth daily.    . prochlorperazine (COMPAZINE) 10 MG tablet TAKE 1 TABLET BY MOUTH EVERY 6 HOURS AS NEEDED 40 tablet 2  . sotalol (BETAPACE) 80 MG tablet Take by mouth 2 (two) times daily.    [name] voriconazole (VFEND) 200 MG tablet Take 200 mg by mouth 2 (two) times daily.     No current facility-administered medications for this visit.   Facility-Administered Medications Ordered in Other Visits  Medication Dose  Route Frequency Provider Last Rate Last Dose  . chlorhexidine (HIBICLENS) 4 % liquid 4 application  60 mL Topical Once Danae Orleans, PA-C       And  . chlorhexidine (HIBICLENS) 4 % liquid 4 application  60 mL Topical Once Danae Orleans, PA-C      . sodium chloride flush (NS) 0.9 % injection 10 mL  10 mL Intravenous PRN Lloyd Huger, MD   10 mL at 12/25/15 0915    OBJECTIVE: Filed Vitals:   04/15/16 1057  BP: 128/80  Pulse: 61  Temp: 98 F (36.7 C)  Resp: 16     Body mass index is 23.29 kg/(m^2).    ECOG FS:2 - Symptomatic, <50% confined to bed  General: Well-developed, well-nourished, no acute distress. Eyes: anicteric sclera. Lungs: Clear to  auscultation bilaterally. Heart: Regular rate and rhythm. No rubs, murmurs, or gallops. Abdomen: Soft, nontender, nondistended. No organomegaly noted, normoactive bowel sounds. Musculoskeletal: No edema, cyanosis, or clubbing. Neuro: Alert, answering all questions appropriately. Cranial nerves grossly intact. Skin: No rashes or petechiae noted. Psych: Normal affect.   LAB RESULTS:  Lab Results  Component Value Date   NA 128* 04/15/2016   K 5.1 04/15/2016   CL 97* 04/15/2016   CO2 20* 04/15/2016   GLUCOSE 273* 04/15/2016   BUN 51* 04/15/2016   CREATININE 1.87* 04/15/2016   CALCIUM 9.3 04/15/2016   PROT 6.9 04/15/2016   ALBUMIN 3.4* 04/15/2016   AST 20 04/15/2016   ALT 18 04/15/2016   ALKPHOS 120 04/15/2016   BILITOT 0.6 04/15/2016   GFRNONAA 34* 04/15/2016   GFRAA 39* 04/15/2016    Lab Results  Component Value Date   WBC 18.2* 04/15/2016   NEUTROABS 14.2* 04/15/2016   HGB 10.4* 04/15/2016   HCT 31.0* 04/15/2016   MCV 85.2 04/15/2016   PLT 390 04/15/2016     STUDIES: Dg Chest Portable 1 View  04/08/2016  CLINICAL DATA:  Unresponsive. Hypoglycemia and weakness. Metastatic urothelial cancer. EXAM: PORTABLE CHEST 1 VIEW COMPARISON:  Chest radiograph 12/03/2015.  PET-CT 12/20/2015. FINDINGS: Right jugular  Port-A-Cath remains in place terminating over the lower SVC. The cardiomediastinal silhouette is within normal limits. 2.9 cm partially cavitary nodule in the lateral left upper lobe is stable to slightly larger than on the prior radiograph and PET-CT. There is no evidence of acute airspace consolidation, edema, pleural effusion, or pneumothorax. Thoracic spondylosis is noted. IMPRESSION: 1. No evidence of acute cardiopulmonary process. 2. Stable to slight enlargement of cavitary left upper lobe nodule. Electronically Signed   By: Logan Bores M.D.   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  PLAN:    1.  Urothelial carcinoma:  PET scan results from December 20, 2015 reported as above with at least stable disease. Given patient's persistent symptoms and declining performance status, cisplatin and gemcitabine was discontinued. Despite patient's declining performance status, we will proceed with cycle 6 of nivolumab 240 mg. Return to clinic in 2 weeks for consideration of cycle 7.  Will reimage with PET scan prior to next infusion.  End-of-life care and hospice was previously discussed with the patient and his family. They are not ready to transition to this at this time, but understand that given his declining performance status he may need to in the near future.  If patient progresses and he wishes to continue treatment, can consider treatment with atezolizumab. 2.  Lung nodule: Pathology result reviewed independently with hyphae noted on sample.  Treatment per Dr. Ola Spurr in infectious disease. Patient has an appointment later this week. 3. Pain: Likely secondary to malignancy. Patient previously had XRT to his pelvic area. Continue Fentanyl patch 100 mcg Q 72 hours.  4. Diabetes/hyperglycemia: Patient's blood sugars are now persistently elevated. Continue monitoring and adjustment of medications per primary care.  5. Anemia: Mild, monitor. 6.  Atrial fibrillation: Patient's heart is in regular rhythm today. Continue treatment and follow up with cardiology. 7. Nausea/Vomiting: Continue treatment as above. 8. Chronic renal insufficiency:  Continue to monitor closely. Proceed with treatment as above. 9. Constipation: Miralax PRN.  Patient expressed understanding and was in agreement with this plan. He also understands that He can call clinic at any time with any questions, concerns, or complaints.   Urothelial cancer   Staging form:  Kidney, AJCC 7th Edition     Clinical stage from 03/16/2015: Stage IV (TX, N1, M1) - Signed by Lloyd Huger, MD on 03/16/2015  Lloyd Huger, MD   04/16/2016 3:49 PM

## 2016-04-21 ENCOUNTER — Telehealth: Payer: Self-pay | Admitting: Family Medicine

## 2016-04-21 NOTE — Telephone Encounter (Signed)
Crystal with [facility] is calling to advise pt is taking over the counter medication.  Pt is taking Vitamin D3 1000IU 1 time daily, Red Yeast rice 600mg  1 time a day, Tumeric curcumin 500mg  1 time a day and garlic 1000mg  1 time a day.  Pt blood sugar this morning is 184.  [id number]

## 2016-04-24 ENCOUNTER — Ambulatory Visit
Admission: RE | Admit: 2016-04-24 | Discharge: 2016-04-24 | Disposition: A | Discharge status: Home / Self Care | Payer: PPO | Source: Ambulatory Visit | Attending: Oncology | Admitting: Oncology

## 2016-04-24 DIAGNOSIS — C782 Secondary malignant neoplasm of pleura: Secondary | ICD-10-CM | POA: Insufficient documentation

## 2016-04-24 DIAGNOSIS — C7951 Secondary malignant neoplasm of bone: Secondary | ICD-10-CM | POA: Insufficient documentation

## 2016-04-24 DIAGNOSIS — C689 Malignant neoplasm of urinary organ, unspecified: Secondary | ICD-10-CM

## 2016-04-24 DIAGNOSIS — C787 Secondary malignant neoplasm of liver and intrahepatic bile duct: Secondary | ICD-10-CM | POA: Insufficient documentation

## 2016-04-24 LAB — GLUCOSE, CAPILLARY: Glucose-Capillary: 70 mg/dL (ref 65–99)

## 2016-04-24 MED ORDER — FLUDEOXYGLUCOSE F - 18 (FDG) INJECTION
10.9900 | Freq: Once | INTRAVENOUS | Status: AC | PRN
  Administered 2016-04-24: 10.99 via INTRAVENOUS

## 2016-04-29 ENCOUNTER — Inpatient Hospital Stay: Payer: PPO

## 2016-04-29 ENCOUNTER — Other Ambulatory Visit: Payer: Self-pay | Admitting: *Deleted

## 2016-04-29 ENCOUNTER — Inpatient Hospital Stay (HOSPITAL_BASED_OUTPATIENT_CLINIC_OR_DEPARTMENT_OTHER): Payer: PPO | Admitting: Oncology

## 2016-04-29 ENCOUNTER — Other Ambulatory Visit: Payer: Self-pay | Admitting: Oncology

## 2016-04-29 ENCOUNTER — Inpatient Hospital Stay: Payer: PPO | Attending: Oncology

## 2016-04-29 VITALS — BP 99/64 | HR 56 | Temp 98.4°F | Wt 157.6 lb

## 2016-04-29 DIAGNOSIS — R5383 Other fatigue: Secondary | ICD-10-CM | POA: Diagnosis not present

## 2016-04-29 DIAGNOSIS — E1165 Type 2 diabetes mellitus with hyperglycemia: Secondary | ICD-10-CM

## 2016-04-29 DIAGNOSIS — I2510 Atherosclerotic heart disease of native coronary artery without angina pectoris: Secondary | ICD-10-CM | POA: Diagnosis not present

## 2016-04-29 DIAGNOSIS — I4891 Unspecified atrial fibrillation: Secondary | ICD-10-CM

## 2016-04-29 DIAGNOSIS — Z79899 Other long term (current) drug therapy: Secondary | ICD-10-CM

## 2016-04-29 DIAGNOSIS — R531 Weakness: Secondary | ICD-10-CM

## 2016-04-29 DIAGNOSIS — Z7982 Long term (current) use of aspirin: Secondary | ICD-10-CM

## 2016-04-29 DIAGNOSIS — C689 Malignant neoplasm of urinary organ, unspecified: Secondary | ICD-10-CM

## 2016-04-29 DIAGNOSIS — Z9221 Personal history of antineoplastic chemotherapy: Secondary | ICD-10-CM | POA: Diagnosis not present

## 2016-04-29 DIAGNOSIS — Z8546 Personal history of malignant neoplasm of prostate: Secondary | ICD-10-CM | POA: Insufficient documentation

## 2016-04-29 DIAGNOSIS — C679 Malignant neoplasm of bladder, unspecified: Secondary | ICD-10-CM

## 2016-04-29 DIAGNOSIS — R911 Solitary pulmonary nodule: Secondary | ICD-10-CM | POA: Diagnosis not present

## 2016-04-29 DIAGNOSIS — R9431 Abnormal electrocardiogram [ECG] [EKG]: Secondary | ICD-10-CM | POA: Insufficient documentation

## 2016-04-29 DIAGNOSIS — Z7901 Long term (current) use of anticoagulants: Secondary | ICD-10-CM | POA: Diagnosis not present

## 2016-04-29 DIAGNOSIS — C787 Secondary malignant neoplasm of liver and intrahepatic bile duct: Secondary | ICD-10-CM | POA: Diagnosis not present

## 2016-04-29 DIAGNOSIS — D649 Anemia, unspecified: Secondary | ICD-10-CM

## 2016-04-29 DIAGNOSIS — R630 Anorexia: Secondary | ICD-10-CM

## 2016-04-29 DIAGNOSIS — I10 Essential (primary) hypertension: Secondary | ICD-10-CM | POA: Insufficient documentation

## 2016-04-29 DIAGNOSIS — G4730 Sleep apnea, unspecified: Secondary | ICD-10-CM

## 2016-04-29 DIAGNOSIS — Z923 Personal history of irradiation: Secondary | ICD-10-CM | POA: Insufficient documentation

## 2016-04-29 DIAGNOSIS — I252 Old myocardial infarction: Secondary | ICD-10-CM | POA: Diagnosis not present

## 2016-04-29 DIAGNOSIS — R109 Unspecified abdominal pain: Secondary | ICD-10-CM | POA: Insufficient documentation

## 2016-04-29 DIAGNOSIS — F419 Anxiety disorder, unspecified: Secondary | ICD-10-CM

## 2016-04-29 DIAGNOSIS — E785 Hyperlipidemia, unspecified: Secondary | ICD-10-CM | POA: Insufficient documentation

## 2016-04-29 DIAGNOSIS — Z7984 Long term (current) use of oral hypoglycemic drugs: Secondary | ICD-10-CM

## 2016-04-29 DIAGNOSIS — M1990 Unspecified osteoarthritis, unspecified site: Secondary | ICD-10-CM

## 2016-04-29 LAB — CBC WITH DIFFERENTIAL/PLATELET
BASOS ABS: 0.1 10*3/uL (ref 0–0.1)
BASOS PCT: 1 %
EOS PCT: 1 %
Eosinophils Absolute: 0.1 10*3/uL (ref 0–0.7)
HCT: 34.1 % — ABNORMAL LOW (ref 40.0–52.0)
HEMOGLOBIN: 11.4 g/dL — AB (ref 13.0–18.0)
LYMPHS ABS: 2.6 10*3/uL (ref 1.0–3.6)
Lymphocytes Relative: 18 %
MCH: 28.8 pg (ref 26.0–34.0)
MCHC: 33.4 g/dL (ref 32.0–36.0)
MCV: 86.2 fL (ref 80.0–100.0)
Monocytes Absolute: 1.1 10*3/uL — ABNORMAL HIGH (ref 0.2–1.0)
Monocytes Relative: 8 %
NEUTROS PCT: 72 %
Neutro Abs: 10.8 10*3/uL — ABNORMAL HIGH (ref 1.4–6.5)
PLATELETS: 355 10*3/uL (ref 150–440)
RBC: 3.95 MIL/uL — AB (ref 4.40–5.90)
RDW: 17.7 % — ABNORMAL HIGH (ref 11.5–14.5)
WBC: 14.7 10*3/uL — AB (ref 3.8–10.6)

## 2016-04-29 LAB — COMPREHENSIVE METABOLIC PANEL
ALBUMIN: 3.4 g/dL — AB (ref 3.5–5.0)
ALK PHOS: 123 U/L (ref 38–126)
ALT: 30 U/L (ref 17–63)
AST: 26 U/L (ref 15–41)
Anion gap: 7 (ref 5–15)
BUN: 57 mg/dL — ABNORMAL HIGH (ref 6–20)
CALCIUM: 9.4 mg/dL (ref 8.9–10.3)
CHLORIDE: 98 mmol/L — AB (ref 101–111)
CO2: 20 mmol/L — AB (ref 22–32)
CREATININE: 1.99 mg/dL — AB (ref 0.61–1.24)
GFR calc non Af Amer: 32 mL/min — ABNORMAL LOW (ref >60)
GFR, EST AFRICAN AMERICAN: 37 mL/min — AB (ref >60)
GLUCOSE: 266 mg/dL — AB (ref 65–99)
Potassium: 5.7 mmol/L — ABNORMAL HIGH (ref 3.5–5.1)
SODIUM: 125 mmol/L — AB (ref 135–145)
Total Bilirubin: 0.5 mg/dL (ref 0.3–1.2)
Total Protein: 6.8 g/dL (ref 6.5–8.1)

## 2016-04-29 MED ORDER — PREDNISONE 10 MG (21) PO TBPK: 10.0000 mg | ORAL_TABLET | Freq: Every day | ORAL | Status: DC

## 2016-04-29 MED ORDER — SODIUM CHLORIDE 0.9 % IV SOLN
Freq: Once | INTRAVENOUS | Status: AC
Start: 2016-04-29 — End: 2016-04-29
  Administered 2016-04-29: 15:00:00 via INTRAVENOUS
  Filled 2016-04-29: qty 1000

## 2016-04-29 MED ORDER — SODIUM CHLORIDE 0.9% FLUSH
10.0000 mL | INTRAVENOUS | Status: DC | PRN
  Administered 2016-04-29: 10 mL via INTRAVENOUS
  Filled 2016-04-29: qty 10

## 2016-04-29 MED ORDER — HEPARIN SOD (PORK) LOCK FLUSH 100 UNIT/ML IV SOLN
500.0000 [IU] | Freq: Once | INTRAVENOUS | Status: AC
  Administered 2016-04-29: 500 [IU] via INTRAVENOUS
  Filled 2016-04-29: qty 5

## 2016-04-30 ENCOUNTER — Encounter: Payer: Self-pay | Admitting: *Deleted

## 2016-04-30 ENCOUNTER — Telehealth: Payer: Self-pay | Admitting: *Deleted

## 2016-04-30 DIAGNOSIS — I214 Non-ST elevation (NSTEMI) myocardial infarction: Secondary | ICD-10-CM

## 2016-04-30 DIAGNOSIS — Z9861 Coronary angioplasty status: Secondary | ICD-10-CM

## 2016-04-30 NOTE — Telephone Encounter (Signed)
Patient and family refusing visits because they want hospice care. They will suspend any further visits until call ed back or they get a cancellation of services from [place]

## 2016-05-05 ENCOUNTER — Telehealth: Payer: Self-pay | Admitting: *Deleted

## 2016-05-05 NOTE — Telephone Encounter (Signed)
Aaron Edelman called from Hospice to ask if you would like to continue to be patients primary physician regarding Hospice orders or would you like Dr. Clemmie Krill at [facility] to take over.

## 2016-05-05 NOTE — Progress Notes (Signed)
[place]  Telephone:(336) [phone] Fax:(336) [phone]  ID: [name] OB: 19-Jul-1941  MR#: [id number]  [id number]  Patient Care Team: Birdie Sons, MD as PCP - General (Family Medicine) Murrell Redden, MD (Urology) Dionisio David, MD as Consulting Physician (Cardiology) Lloyd Huger, MD as Consulting Physician (Oncology)  CHIEF COMPLAINT:  Chief Complaint  Patient presents with  . Metastatic urothelial carcinoma ([place]    INTERVAL HISTORY: Patient returns to clinic for further evaluation and discussion of his PET scan results. His performance status continues to decline. He continues to have increased weakness and fatigue. He continues to have pain. He continues to have a poor appetite and decreased PO intake. He does not complain of chest pain or shortness of breath. He has no neurologic complaints. Patient denies any fevers. He denies any diarrhea but he has had occasional constipation but it is controlled with miralax.  He offers no further specific complaints today.  REVIEW OF SYSTEMS:   Review of Systems  Constitutional: Positive for weight loss and malaise/fatigue. Negative for fever.  Respiratory: Negative.  Negative for shortness of breath.   Cardiovascular: Negative.  Negative for chest pain.  Gastrointestinal: Positive for nausea, vomiting and constipation. Negative for abdominal pain.  Genitourinary: Negative.   Musculoskeletal: Negative.   Neurological: Negative.   Psychiatric/Behavioral: The patient is nervous/anxious.     As per HPI. Otherwise, a complete review of systems is negatve.  PAST MEDICAL HISTORY: Past Medical History  Diagnosis Date  . Abnormal EKG   . Coronary artery disease   . Diabetes mellitus without complication ([place])   . Obesity   . Hyperlipidemia   . Hypertension   . Sleep apnea     could not tolerate cpap mask  . Arthritis     oa  . Cancer [facility])     bladder and prostate  . History of radiation  therapy 2012  . History of chemotherapy 2014  . Bladder cancer ([place])   . History of chicken pox   . Myocardial infarction ([place]) 2008 and 2012    x 2  . Cancer ([place])     bladder, penis, bone, lung (mets)    PAST SURGICAL HISTORY: Past Surgical History  Procedure Laterality Date  . Cardiac catheterization  12-06-2003  . Lad stent  2000 x1 stent, 2008 x 1 stent, 2012 x 1 stent  . Hernia repair  yrs ago  . Appendectomy  many yrs ago  . Rotator cuff repair Left yrs ago  . Seed implant to prostate with radiation  2012  . Protatectomy and urostomy  2014  . Total hip arthroplasty Right 12/27/2013    Procedure: RIGHT TOTAL HIP ARTHROPLASTY ANTERIOR APPROACH;  Surgeon: Mauri Pole, MD;  [facility];  Service: Orthopedics;  Laterality: Right;  . Cardiac catheterization N/A 06/04/2015    Procedure: Left Heart Cath and Coronary Angiography;  Surgeon: Corey Skains, MD;  Location: [place] CV LAB;  Service: Cardiovascular;  Laterality: N/A;  . Cardiac catheterization N/A 06/04/2015    Procedure: Coronary Stent Intervention;  Surgeon: Wellington Hampshire, MD;  Location: [place] CV LAB;  Service: Cardiovascular;  Laterality: N/A;  . Peripheral vascular catheterization N/A 09/25/2015    Procedure: IVC Filter Insertion;  Surgeon: Katha Cabal, MD;  Location: [place] CV LAB;  Service: Cardiovascular;  Laterality: N/A;    FAMILY HISTORY: Reviewed and unchanged. No report of malignancy or chronic disease.     ADVANCED DIRECTIVES:  HEALTH MAINTENANCE: Social History  Substance Use Topics  . Smoking status: Never Smoker   . Smokeless tobacco: Never Used  . Alcohol Use: No     Allergies  Allergen Reactions  . Celebrex [Celecoxib] Other (See Comments)    Hematuria Other reaction(s): Bleeding Bleeding in bladder  . Effient [Prasugrel] Other (See Comments)    blood clots, blood in urine Other reaction(s): Bleeding Bleeding in bladder    Current Outpatient  Prescriptions  Medication Sig Dispense Refill  . apixaban (ELIQUIS) 5 MG TABS tablet Take by mouth.    [name] aspirin EC 81 MG tablet Take 81 mg by mouth daily.    [name] atenolol (TENORMIN) 25 MG tablet TAKE 1 TABLET(S) BY MOUTH DAILY 90 tablet 3  . Co-Enzyme Q-10 30 MG CAPS Take by mouth.    . feeding supplement, ENSURE ENLIVE, (ENSURE ENLIVE) LIQD Take 237 mLs by mouth 3 (three) times daily between meals. 90 Bottle 0  . fentaNYL (DURAGESIC - DOSED MCG/HR) 100 MCG/HR Place onto the skin.    . furosemide (LASIX) 20 MG tablet Take by mouth.    [name] glipiZIDE (GLUCOTROL) 10 MG tablet Take by mouth.    . megestrol (MEGACE ES) 625 MG/5ML suspension Take 625 mg by mouth daily.     . Multiple Vitamin (MULTIVITAMIN) capsule Take by mouth.    . ondansetron (ZOFRAN-ODT) 4 MG disintegrating tablet     . ONE TOUCH ULTRA TEST test strip     . pantoprazole (PROTONIX) 40 MG tablet Take by mouth.    . potassium chloride (K-DUR) 10 MEQ tablet     . prasugrel (EFFIENT) 10 MG TABS tablet Take by mouth.    . prochlorperazine (COMPAZINE) 10 MG tablet Take by mouth.    . sotalol (BETAPACE) 80 MG tablet Take by mouth.    . voriconazole (VFEND) 200 MG tablet Take 200 mg by mouth 2 (two) times daily.    . metFORMIN (GLUCOPHAGE-XR) 500 MG 24 hr tablet     . ondansetron (ZOFRAN) 8 MG tablet Take by mouth.    . predniSONE (STERAPRED UNI-PAK 21 TAB) 10 MG (21) TBPK tablet Take 1 tablet (10 mg total) by mouth daily. Taper as directed 21 tablet 0   No current facility-administered medications for this visit.   Facility-Administered Medications Ordered in Other Visits  Medication Dose Route Frequency Provider Last Rate Last Dose  . chlorhexidine (HIBICLENS) 4 % liquid 4 application  60 mL Topical Once Danae Orleans, PA-C       And  . chlorhexidine (HIBICLENS) 4 % liquid 4 application  60 mL Topical Once Danae Orleans, PA-C      . sodium chloride flush (NS) 0.9 % injection 10 mL  10 mL Intravenous PRN Lloyd Huger, MD    10 mL at 12/25/15 0915    OBJECTIVE: Filed Vitals:   04/29/16 1404  BP: 99/64  Pulse: 56  Temp: 98.4 F (36.9 C)     Body mass index is 21.99 kg/(m^2).    ECOG FS:3 - Symptomatic, >50% confined to bed  General: Well-developed, well-nourished, no acute distress. Eyes: anicteric sclera. Lungs: Clear to auscultation bilaterally. Heart: Regular rate and rhythm. No rubs, murmurs, or gallops. Abdomen: Soft, nontender, nondistended. No organomegaly noted, normoactive bowel sounds. Musculoskeletal: No edema, cyanosis, or clubbing. Neuro: Alert, answering all questions appropriately. Cranial nerves grossly intact. Skin: No rashes or petechiae noted. Psych: Normal affect.   LAB RESULTS:  Lab Results  Component Value Date   NA 125* 04/29/2016  K 5.7* 04/29/2016   CL 98* 04/29/2016   CO2 20* 04/29/2016   GLUCOSE 266* 04/29/2016   BUN 57* 04/29/2016   CREATININE 1.99* 04/29/2016   CALCIUM 9.4 04/29/2016   PROT 6.8 04/29/2016   ALBUMIN 3.4* 04/29/2016   AST 26 04/29/2016   ALT 30 04/29/2016   ALKPHOS 123 04/29/2016   BILITOT 0.5 04/29/2016   GFRNONAA 32* 04/29/2016   GFRAA 37* 04/29/2016    Lab Results  Component Value Date   WBC 14.7* 04/29/2016   NEUTROABS 10.8* 04/29/2016   HGB 11.4* 04/29/2016   HCT 34.1* 04/29/2016   MCV 86.2 04/29/2016   PLT 355 04/29/2016     STUDIES: Nm Pet Image Restag (ps) Skull Base To Thigh  04/24/2016  CLINICAL DATA:  Subsequent treatment strategy for urothelial carcinoma. EXAM: NUCLEAR MEDICINE PET SKULL BASE TO THIGH TECHNIQUE: 11.0 mCi F-18 FDG was injected intravenously. Full-ring PET imaging was performed from the skull base to thigh after the radiotracer. CT data was obtained and used for attenuation correction and anatomic localization. FASTING BLOOD GLUCOSE:  Value: 70 mg/dl COMPARISON:  12/20/2015 FINDINGS: NECK No hypermetabolic lymph nodes in the neck. CHEST New sub-cm hypermetabolic left supraclavicular lymph nodes are seen with  SUV max of 11.1, consistent with metastatic disease. New sub-cm hypermetabolic lymph nodes are seen in the right paratracheal region and bilateral hilar regions, consistent with metastatic disease. Increased size subcarinal mediastinal lymph node is seen currently measuring 12 mm compared to 10 mm on prior, with SUV max of 27.5 compared to 13.7 previously. Increased number of foci of pleural hypermetabolism seen throughout both the right and left hemithorax compared to previous study including the pulmonary fissures, consistent with pleural metastases. No evidence of pleural effusion. 2.5 x 2.9 cm cavitary nodule in the left upper lobe on image 102 of series 3 shows no significant change in size, but does show mild increase in metabolic activity with SUV max of 4.0 compared to 2.6 previously. ABDOMEN/PELVIS Hypermetabolic liver metastases in both right and left lobes show mild increase in both size and hypermetabolic activity. Index lesion in the anterior right hepatic lobe measures 4.1 cm on image 136/ series 3 compared to 3.5 cm previously, and has SUV max of 39.9 compared to 18.9 previously. Increased hypermetabolic activity is seen associated with mild lymphadenopathy in the porta hepatis and abdominal retroperitoneum. There has also been increased hypermetabolic lymphadenopathy in both iliac chains and inguinal regions. Index lymphadenopathy in the left inguinal region currently measures 1.6 cm on image 255 of series 3 compared to 1.3 cm previously and has SUV max of 33.3 compared to 27 on prior. Presumed primary mass at the base of the penis has increased in size, currently measuring 7.0 x 5.3 cm on image 259/ series 3 compared to 6.7 x 4.0 cm previously. SUV max currently measures 36.8 compared to 22.5 previously. Prior cystoprostatectomy and ileal conduit again demonstrated, with fat containing peristomal hernia in the right lower quadrant. SKELETON Multifocal osseous metastatic disease within the pelvis  shows interval progression with index lesion in the right ilium measuring SUV max of 30.5 compared to 10.8 previously. IMPRESSION: Widespread progression of soft tissue metastatic disease within the chest, abdomen, and pelvis, including lymphadenopathy, bilateral pleural metastases, and liver metastases. No significant change in presumed pulmonary metastasis in the left upper lobe. Progression of osseous metastatic disease within the pelvis. Progression of presumed primary neoplasm at base of penis. Electronically Signed   By: Earle Gell M.D.   On: 04/24/2016  12:26   Dg Chest Portable 1 View  04/08/2016  CLINICAL DATA:  Unresponsive. Hypoglycemia and weakness. Metastatic urothelial cancer. EXAM: PORTABLE CHEST 1 VIEW COMPARISON:  Chest radiograph 12/03/2015.  PET-CT 12/20/2015. FINDINGS: Right jugular Port-A-Cath remains in place terminating over the lower SVC. The cardiomediastinal silhouette is within normal limits. 2.9 cm partially cavitary nodule in the lateral left upper lobe is stable to slightly larger than on the prior radiograph and PET-CT. There is no evidence of acute airspace consolidation, edema, pleural effusion, or pneumothorax. Thoracic spondylosis is noted. IMPRESSION: 1. No evidence of acute cardiopulmonary process. 2. Stable to slight enlargement of cavitary left upper lobe nodule. Electronically Signed   By: Logan Bores M.D.   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  PLAN:    1.  Urothelial carcinoma:  PET scan results reviewed independently and reported as above with significant progression of patient's disease. This in combination of patient's declining performance status, he is agreed to discontinue treatments and enroll in hospice. No further intervention is needed at this time. No follow-up has been scheduled.  2.  Lung nodule: Pathology result reviewed independently with hyphae noted on sample.  Treatment per Dr.  Ola Spurr in infectious disease.  3. Pain: Likely secondary to malignancy. Patient previously had XRT to his pelvic area. Continue Fentanyl patch 100 mcg Q 72 hours.  Hospice as above. 4. Diabetes/hyperglycemia: Patient's blood sugars are now persistently elevated. Continue monitoring and adjustment of medications per primary care.  5. Anemia: Mild, monitor. 6. Atrial fibrillation: Patient's heart is in regular rhythm today. Continue treatment and follow up with cardiology.  Approximately 30 minutes was spent in discussion of which greater than 50% was consultation.  Patient expressed understanding and was in agreement with this plan. He also understands that He can call clinic at any time with any questions, concerns, or complaints.   Urothelial cancer   Staging form: Kidney, AJCC 7th Edition     Clinical stage from 03/16/2015: Stage IV ([place], N1, M1) - Signed by Lloyd Huger, MD on 03/16/2015  Lloyd Huger, MD   05/05/2016 11:37 PM

## 2016-05-06 ENCOUNTER — Telehealth: Payer: Self-pay | Admitting: *Deleted

## 2016-05-06 ENCOUNTER — Encounter: Payer: Self-pay | Admitting: *Deleted

## 2016-05-06 DIAGNOSIS — Z9861 Coronary angioplasty status: Secondary | ICD-10-CM

## 2016-05-06 DIAGNOSIS — I214 Non-ST elevation (NSTEMI) myocardial infarction: Secondary | ICD-10-CM

## 2016-05-06 NOTE — Progress Notes (Signed)
Cardiac Individual Treatment Plan  Patient Details  Name: Louis Alexander MRN: [id number] Date of Birth: [birth year] Referring Provider:    Initial Encounter Date:       Cardiac Rehab from 07/31/2015 in [facility] Cardiac and Pulmonary Rehab   Date  07/31/15      Visit Diagnosis: S/P PTCA (percutaneous transluminal coronary angioplasty)  NSTEMI (non-ST elevated myocardial infarction) ([place])  Patient's Home Medications on Admission:  Current outpatient prescriptions:  .  apixaban (ELIQUIS) 5 MG TABS tablet, Take by mouth., Disp: , Rfl:  .  aspirin EC 81 MG tablet, Take 81 mg by mouth daily., Disp: , Rfl:  .  atenolol (TENORMIN) 25 MG tablet, TAKE 1 TABLET(S) BY MOUTH DAILY, Disp: 90 tablet, Rfl: 3 .  Co-Enzyme Q-10 30 MG CAPS, Take by mouth., Disp: , Rfl:  .  feeding supplement, ENSURE ENLIVE, (ENSURE ENLIVE) LIQD, Take 237 mLs by mouth 3 (three) times daily between meals., Disp: 90 Bottle, Rfl: 0 .  fentaNYL (DURAGESIC - DOSED MCG/HR) 100 MCG/HR, Place onto the skin., Disp: , Rfl:  .  furosemide (LASIX) 20 MG tablet, Take by mouth., Disp: , Rfl:  .  glipiZIDE (GLUCOTROL) 10 MG tablet, Take by mouth., Disp: , Rfl:  .  megestrol (MEGACE ES) 625 MG/5ML suspension, Take 625 mg by mouth daily. , Disp: , Rfl:  .  metFORMIN (GLUCOPHAGE-XR) 500 MG 24 hr tablet, , Disp: , Rfl:  .  Multiple Vitamin (MULTIVITAMIN) capsule, Take by mouth., Disp: , Rfl:  .  ondansetron (ZOFRAN) 8 MG tablet, Take by mouth., Disp: , Rfl:  .  ondansetron (ZOFRAN-ODT) 4 MG disintegrating tablet, , Disp: , Rfl:  .  ONE TOUCH ULTRA TEST test strip, , Disp: , Rfl:  .  pantoprazole (PROTONIX) 40 MG tablet, Take by mouth., Disp: , Rfl:  .  potassium chloride (K-DUR) 10 MEQ tablet, , Disp: , Rfl:  .  prasugrel (EFFIENT) 10 MG TABS tablet, Take by mouth., Disp: , Rfl:  .  predniSONE (STERAPRED UNI-PAK 21 TAB) 10 MG (21) TBPK tablet, Take 1 tablet (10 mg total) by mouth daily. Taper as directed, Disp: 21 tablet, Rfl: 0 .   prochlorperazine (COMPAZINE) 10 MG tablet, Take by mouth., Disp: , Rfl:  .  sotalol (BETAPACE) 80 MG tablet, Take by mouth., Disp: , Rfl:  .  voriconazole (VFEND) 200 MG tablet, Take 200 mg by mouth 2 (two) times daily., Disp: , Rfl:  No current facility-administered medications for this visit.  Facility-Administered Medications Ordered in Other Visits:  .  Shower Chin To Toes With 60 mL chlorhexidine (HIBICLENS) the night before surgery, , , Once **AND** Shower Chin To Toes With 60 mL chlorhexidine (HIBICLENS) in AM of surgery after pre-op clip completed, , , Once **AND** chlorhexidine (HIBICLENS) 4 % liquid 4 application, 60 mL, Topical, Once **AND** chlorhexidine (HIBICLENS) 4 % liquid 4 application, 60 mL, Topical, Once, Danae Orleans, PA-C .  sodium chloride flush (NS) 0.9 % injection 10 mL, 10 mL, Intravenous, PRN, Lloyd Huger, MD, 10 mL at 12/25/15 0915  Past Medical History: Past Medical History  Diagnosis Date  . Abnormal EKG   . Coronary artery disease   . Diabetes mellitus without complication ([place])   . Obesity   . Hyperlipidemia   . Hypertension   . Sleep apnea     could not tolerate cpap mask  . Arthritis     oa  . Cancer [facility])     bladder and prostate  . History of  radiation therapy 2012  . History of chemotherapy 2014  . Bladder cancer ([place])   . History of chicken pox   . Myocardial infarction ([place]) 2008 and 2012    x 2  . Cancer (HCC)     bladder, penis, bone, lung (m[place])    Tobacco Use: History  Smoking status  . Never Smoker   Smokeless tobacco  . Never Used    Labs: Recent Review Flowsheet Data    Labs for ITP Cardiac and Pulmonary Rehab Latest Ref Rng 10/26/2012 12/22/2014 06/12/2015 09/12/2015 02/25/2016   Cholestrol 0 - 200 mg/dL 134 189 - - -   LDLCALC - 41 115 - - -   HDL 35 - 70 mg/dL 36(L) 41 - - -   Trlycerides 40 - 160 mg/dL 284(H) 165(A) - - -   Hemoglobin A1c - - 7.2(A) 7.7 9.2 6.3       Exercise Target Goals:    Exercise  Program Goal: Individual exercise prescription set with THRR, safety & activity barriers. Participant demonstrates ability to understand and report RPE using BORG scale, to self-measure pulse accurately, and to acknowledge the importance of the exercise prescription.  Exercise Prescription Goal: Starting with aerobic activity 30 plus minutes a day, 3 days per week for initial exercise prescription. Provide home exercise prescription and guidelines that participant acknowledges understanding prior to discharge.  Activity Barriers & Risk Stratification:   6 Minute Walk:   Initial Exercise Prescription:   Perform Capillary Blood Glucose checks as needed.  Exercise Prescription Changes:     Exercise Prescription Changes      11/13/15 0700 11/30/15 0733 12/12/15 0800 12/26/15 0800 12/28/15 1027   Exercise Review   Progression No  Absent since last review; last visit 08/06/15 Yes   Yes   Response to Exercise   Blood Pressure (Admit)  138/82 mmHg   150/78 mmHg   Blood Pressure (Exercise)  130/74 mmHg   148/80 mmHg   Blood Pressure (Exit)  114/66 mmHg   118/70 mmHg   Heart Rate (Admit)  74 bpm   88 bpm   Heart Rate (Exercise)  88 bpm   86 bpm   Heart Rate (Exit)  72 bpm   70 bpm   Rating of Perceived Exertion (Exercise)  13   13   Symptoms  None None None None   Comments Exercise workloads will need to be reviewed upon his return based on his exercise capacity.  Louis Alexander is actually in good fitness for being out for so long and feels a lot better. He made some increases this month and is encouraged by how well he feels. He is very compliant to exercise suggestions and willing to progress.  Reviewed individualized exercise prescription and made increases per departmental policy. Exercise increases were discussed with the patient and they were able to perform the new work loads without issue (no signs or symptoms).  Louis Alexander continues to progress despite ongoing health issues. Louis Alexander is going to try to do  some activity at home on days he is not in class. He will start with one day a week of doing light activity at home.   Duration Progress to 30 minutes of continuous aerobic without signs/symptoms of physical distress Progress to 30 minutes of continuous aerobic without signs/symptoms of physical distress Progress to 30 minutes of continuous aerobic without signs/symptoms of physical distress Progress to 50 minutes of aerobic without signs/symptoms of physical distress Progress to 50 minutes of aerobic without signs/symptoms of physical distress  Intensity Rest + 30 Rest + 30 Rest + 30 Rest + 30 Rest + 30   Progression   Progression Continue progressive overload as per policy without signs/symptoms or physical distress. Continue progressive overload as per policy without signs/symptoms or physical distress. Continue progressive overload as per policy without signs/symptoms or physical distress. Continue progressive overload as per policy without signs/symptoms or physical distress. Continue progressive overload as per policy without signs/symptoms or physical distress.   Resistance Training   Training Prescription (read-only) _0    Weight (read-only) _1 Reps (read-only) 10-15 10-15 10-15 10-15 10-15   Interval Training   Interval Training _2    Treadmill   MPH (read-only) 1.5 1.7 1.7 1.7 1.7   Grade (read-only) 0 0 0 0 0   Minutes (read-only) _3 REL-XR   Level (read-only) _4 Watts (read-only) 40 60 60 60 60   Minutes (read-only) _5 Biostep-RELP   Level (read-only)  _6 Watts (read-only)  45 45 45 40   Minutes (read-only)  _7 Home Exercise Plan   Frequency     Add 1 additional day to program exercise sessions.     01/28/16 1100           Exercise Review   Progression No  Absent since last review; last visit 12/28/15       Response to Exercise   Blood Pressure (Admit) --       Blood Pressure  (Exercise) --       Blood Pressure (Exit) --       Heart Rate (Admit) --       Heart Rate (Exercise) --       Heart Rate (Exit) --       Rating of Perceived Exertion (Exercise) --       Symptoms None       Comments Ex. rx. will be re-evaluated upon return due to extended absence       Duration Progress to 50 minutes of aerobic without signs/symptoms of physical distress       Intensity Rest + 30       Progression   Progression Continue progressive overload as per policy without signs/symptoms or physical distress.       Interval Training   Interval Training No       Home Exercise Plan   Frequency --          Exercise Comments:     Exercise Comments      03/05/16 0651 04/02/16 0655 04/30/16 1507       Exercise Comments The patient has not attended class since the last review. There has been no progress made due to lack of attendance. Workloads may need to be adjusted if the patient returns to class.  The patient has not attended class since the last review. There has been no progress made due to lack of attendance. Workloads may need to be adjusted if the patient returns to class.  Pt has not attended rehab since last medical review.        Discharge Exercise Prescription (Final Exercise Prescription Changes):     Exercise Prescription Changes - 01/28/16 1100    Exercise Review   Progression No  Absent since last review; last visit 12/28/15   Response to Exercise  Blood Pressure (Admit) --   Blood Pressure (Exercise) --   Blood Pressure (Exit) --   Heart Rate (Admit) --   Heart Rate (Exercise) --   Heart Rate (Exit) --   Rating of Perceived Exertion (Exercise) --   Symptoms None   Comments Ex. rx. will be re-evaluated upon return due to extended absence   Duration Progress to 50 minutes of aerobic without signs/symptoms of physical distress   Intensity Rest + 30   Progression   Progression Continue progressive overload as per policy without signs/symptoms or physical  distress.   Interval Training   Interval Training No   Home Exercise Plan   Frequency --      Nutrition:  Target Goals: Understanding of nutrition guidelines, daily intake of sodium <1526m, cholesterol <2088m calories 30% from fat and 7% or less from saturated fats, daily to have 5 or more servings of fruits and vegetables.  Biometrics:    Nutrition Therapy Plan and Nutrition Goals:     Nutrition Therapy & Goals - 12/07/15 0931    Personal Nutrition Goals   Personal Goal #1 DoTimmothy Sourss still doing well with his heart healthy eating and has no concerns or questions      Nutrition Discharge: Rate Your Plate Scores:   Nutrition Goals Re-Evaluation:   Psychosocial: Target Goals: Acknowledge presence or absence of depression, maximize coping skills, provide positive support system. Participant is able to verbalize types and ability to use techniques and skills needed for reducing stress and depression.  Initial Review & Psychosocial Screening:   Quality of Life Scores:   PHQ-9:     Recent Review Flowsheet Data    [facility] 07/31/2015 05/03/2015   Decreased Interest 0 0   Down, Depressed, Hopeless 0 0   PHQ - 2 Score 0 0   Altered sleeping 3 -   Tired, decreased energy 1 -   Change in appetite 0 -   Feeling bad or failure about yourself  0 -   Trouble concentrating 0 -   Moving slowly or fidgety/restless 1 -   Suicidal thoughts 0 -   PHQ-9 Score 5 -   Difficult doing work/chores Not difficult at all -      Psychosocial Evaluation and Intervention:     Psychosocial Evaluation - 12/12/15 1037    Psychosocial Evaluation & Interventions   Interventions Therapist referral;Encouraged to exercise with the program and follow exercise prescription;Stress management education   Comments Counselor met with Mr. PrKelleyor initial psychosocical evaluation.  Louis Alexander been attending this program for awhile, but is on a different schedule than counselor, so  hard to connect.  Mr. P Mamie Nicks a [id number] old who had his 4th stent inserted several months ago.  He has a strong support system with a spouse of [phone], several adult children close by and active involvement in a local church community.  Louis Alexander other health issues that are impacting his recovery as he is currently on Chemotherapy due to several cancer spots detected in his body.  Mr. P Mamie Nicktates he sleeps okay (a little better lately) and his appetite is "not good" due to the Chemo.  He denies a history of depression or anxiety, but does admit some current symptoms with sadness and tearfulness more lately.  Louis Alexander his mood is a "3" on a scale of 1-10, with "10" being best.  Mr. P Mamie Nicktates his mood was better several weeks ago, until he learned  about additional "spots" of cancer he was not aware of at the time.  He admits his health, trying to get the house sold, and finances are the biggest stressors for him currently.  He had goals for increased energy for this program and has already experienced that, with Mr. Mamie Alexander reporting being able to walk faster and further lately.  He plans to join a gym to continue working out consistently upon completion of this program.  Counselor recommended Mr. Mamie Alexander see a local therapist to address his mood and provide supportive services during this time with so many stressors and health issues impacting his functioning.  Contact information for the therapist was provided.     Continued Psychosocial Services Needed --  Counselor will follow up with recommendation for Mr. Mamie Alexander to see a counselor, as well as participation in stress management  psychoeducational component of this program.        Psychosocial Re-Evaluation:     Psychosocial Re-Evaluation      01/28/16 1300           Psychosocial Re-Evaluation   Comments I called Elenore Rota and he and his wife are packing up their house since their house is up for sale. He is nauseaseated today. He is suppose to get another cancer  treatment tomorrow. "It is too much for him to do Cardiac REhab now"          Vocational Rehabilitation: Provide vocational rehab assistance to qualifying candidates.   Vocational Rehab Evaluation & Intervention:   Education: Education Goals: Education classes will be provided on a weekly basis, covering required topics. Participant will state understanding/return demonstration of topics presented.  Learning Barriers/Preferences:   Education Topics: General Nutrition Guidelines/Fats and Fiber: -Group instruction provided by verbal, written material, models and posters to present the general guidelines for heart healthy nutrition. Gives an explanation and review of dietary fats and fiber.   Controlling Sodium/Reading Food Labels: -Group verbal and written material supporting the discussion of sodium use in heart healthy nutrition. Review and explanation with models, verbal and written materials for utilization of the food label.   Exercise Physiology & Risk Factors: - Group verbal and written instruction with models to review the exercise physiology of the cardiovascular system and associated critical values. Details cardiovascular disease risk factors and the goals associated with each risk factor.          Cardiac Rehab from 12/26/2015 in [facility] Cardiac and Pulmonary Rehab   Date  11/21/15   Educator  RM   Instruction Review Code  2- meets goals/outcomes      Aerobic Exercise & Resistance Training: - Gives group verbal and written discussion on the health impact of inactivity. On the components of aerobic and resistive training programs and the benefits of this training and how to safely progress through these programs.   Flexibility, Balance, General Exercise Guidelines: - Provides group verbal and written instruction on the benefits of flexibility and balance training programs. Provides general exercise guidelines with specific guidelines to those with heart or lung disease.  Demonstration and skill practice provided.      Cardiac Rehab from 12/26/2015 in [facility] Cardiac and Pulmonary Rehab   Date  08/06/15   Educator  [facility]   Instruction Review Code  2- meets goals/outcomes      Stress Management: - Provides group verbal and written instruction about the health risks of elevated stress, cause of high stress, and healthy ways to reduce stress.   Depression: - Provides group verbal  and written instruction on the correlation between heart/lung disease and depressed mood, treatment options, and the stigmas associated with seeking treatment.   Anatomy & Physiology of the Heart: - Group verbal and written instruction and models provide basic cardiac anatomy and physiology, with the coronary electrical and arterial systems. Review of: AMI, Angina, Valve disease, Heart Failure, Cardiac Arrhythmia, Pacemakers, and the ICD.   Cardiac Procedures: - Group verbal and written instruction and models to describe the testing methods done to diagnose heart disease. Reviews the outcomes of the test results. Describes the treatment choices: Medical Management, Angioplasty, or Coronary Bypass Surgery.   Cardiac Medications: - Group verbal and written instruction to review commonly prescribed medications for heart disease. Reviews the medication, class of the drug, and side effects. Includes the steps to properly store meds and maintain the prescription regimen.      Cardiac Rehab from 12/26/2015 in [facility] Cardiac and Pulmonary Rehab   Date  12/26/15   Educator  SB   Instruction Review Code  2- meets goals/outcomes      Go Sex-Intimacy & Heart Disease, Get SMART - Goal Setting: - Group verbal and written instruction through game format to discuss heart disease and the return to sexual intimacy. Provides group verbal and written material to discuss and apply goal setting through the application of the S.M.A.R.T. Method.   Other Matters of the Heart: - Provides group verbal, written  materials and models to describe Heart Failure, Angina, Valve Disease, and Diabetes in the realm of heart disease. Includes description of the disease process and treatment options available to the cardiac patient.   Exercise & Equipment Safety: - Individual verbal instruction and demonstration of equipment use and safety with use of the equipment.      Cardiac Rehab from 12/26/2015 in [facility] Cardiac and Pulmonary Rehab   Date  08/06/15   Educator  RM   Instruction Review Code  2- meets goals/outcomes      Infection Prevention: - Provides verbal and written material to individual with discussion of infection control including proper hand washing and proper equipment cleaning during exercise session.      Cardiac Rehab from 12/26/2015 in [facility] Cardiac and Pulmonary Rehab   Date  08/06/15   Educator  RM   Instruction Review Code  2- meets goals/outcomes      Falls Prevention: - Provides verbal and written material to individual with discussion of falls prevention and safety.   Diabetes: - Individual verbal and written instruction to review signs/symptoms of diabetes, desired ranges of glucose level fasting, after meals and with exercise. Advice that pre and post exercise glucose checks will be done for 3 sessions at entry of program.      Cardiac Rehab from 12/26/2015 in [facility] Cardiac and Pulmonary Rehab   Date  07/31/15   Educator  SB   Instruction Review Code  2- meets goals/outcomes       Knowledge Questionnaire Score:   Core Components/Risk Factors/Patient Goals at Admission:   Core Components/Risk Factors/Patient Goals Review:      Goals and Risk Factor Review      12/04/15 0737 12/07/15 0931 12/31/15 1035 01/28/16 1301     Core Components/Risk Factors/Patient Goals Review   Personal Goals Review  Hypertension;Diabetes;Lipids  Diabetes;Hypertension;Lipids    Review    I just called and Jerrold was lying down resting since his is nauseated from his cancer treatments she said.  She said it is hard for him to eat with the nausea.  Corran's wife said she knows he hopes to get back exercising in Cardiac Rehab in a month or so.     Expected Outcomes    Long term to be able to exercise in Cardiac Rehab to help his HTN, Diabetes and Lipids.     Increase Aerobic Exercise and Physical Activity (read-only)   Goals Progress/Improvement seen  Yes  Yes     Comments Louis Alexander is progressing well ,especially considering he was out for several months. The cancer and treatments were affecting his exercise and he is feeling much better now. Until he has more consistent attendance, we will hold off on interval training and adding exercise at home. We will follow up with him in February on how he is feeling with the exercise and add new goals at that time.   Louis Alexander continues to progress despite ongoing health issues, his attendance has been more consistent which has helped with better exercise progression and him feeling more confident with exercise workloads. Louis Alexander is going to try to do some activity at home on days he is not in class. He will start with one day a week of doing light activity at home. We will follow up with him in the coming weeks on how that home exercise is going.      Diabetes (read-only)   Progress seen towards goals  No      Comments  No change      Hypertension (read-only)   Progress seen toward goals  No      Comments  No change, BP medication is doing its job  and BP readings in class are in appropriate range      Abnormal Lipids (read-only)   Progress seen towards goals  No      Comments  Lipids have not been re-evaluated         Core Components/Risk Factors/Patient Goals at Discharge (Final Review):      Goals and Risk Factor Review - 01/28/16 1301    Core Components/Risk Factors/Patient Goals Review   Personal Goals Review Diabetes;Hypertension;Lipids   Review I just called and Linn was lying down resting since his is nauseated from his cancer treatments she said.  She said it is hard for him to eat with the nausea. Clinton's wife said she knows he hopes to get back exercising in Cardiac Rehab in a month or so.    Expected Outcomes Long term to be able to exercise in Cardiac Rehab to help his HTN, Diabetes and Lipids.       ITP Comments:     ITP Comments      11/18/15 1225 11/23/15 0946 12/13/15 1134 01/08/16 0804 01/28/16 1259   ITP Comments Ready for 30 day review.  Continue with ITP  Has only attended 4 sessions. Last on 08/06/2015. Has been out with medical concerns. Plans to return Jan 4.  Louis Alexander did well after first day back on Wed. He was tired today, so was advised to slow down and take rest breaks as needed.  Ready for 30 day review. Continue with ITP. 30 day review.  Continue with ITP.  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 He attends sessions as often as he can. I called [place] and he and his wife are packing up their house since their house is up for sale. He is nauseaseated today. He is suppose to get another cancer treatment tomorrow.  02/07/16 1258 03/09/16 0929 04/06/16 1230 04/30/16 1506 05/06/16 1514   ITP Comments 30 Day Review. Continue with the ITP.  Remains out secondary to Chemo symptoms. 30 day review.  Continue with ITP  Remains out for medical reasons 30 day review. Continue with ITP. Has been absent medical reasons since2/15/2017 Pt has not attended rehab since last medical review. discharged     health issues      Comments:

## 2016-05-06 NOTE — Telephone Encounter (Signed)
Spoke with Louis Alexander and will pick up behind registration desk later today.

## 2016-05-06 NOTE — Progress Notes (Signed)
Discharge Summary  Patient Details  Name: Louis Alexander MRN: [id number] Date of Birth: December 16, 1940 Referring Provider:     Number of Visits: 14   Reason for Discharge:  Early Exit:  illness  Smoking History:  History  Smoking status  . Never Smoker   Smokeless tobacco  . Never Used    Diagnosis:  S/P PTCA (percutaneous transluminal coronary angioplasty)  NSTEMI (non-ST elevated myocardial infarction) ([place])  ADL UCSD:   Initial Exercise Prescription:   Discharge Exercise Prescription (Final Exercise Prescription Changes):     Exercise Prescription Changes - 01/28/16 1100    Exercise Review   Progression No  Absent since last review; last visit 12/28/15   Response to Exercise   Blood Pressure (Admit) --   Blood Pressure (Exercise) --   Blood Pressure (Exit) --   Heart Rate (Admit) --   Heart Rate (Exercise) --   Heart Rate (Exit) --   Rating of Perceived Exertion (Exercise) --   Symptoms None   Comments Ex. rx. will be re-evaluated upon return due to extended absence   Duration Progress to 50 minutes of aerobic without signs/symptoms of physical distress   Intensity Rest + 30   Progression   Progression Continue progressive overload as per policy without signs/symptoms or physical distress.   Interval Training   Interval Training No   Home Exercise Plan   Frequency --      Functional Capacity:   Psychological, QOL, Others - Outcomes: PHQ 2/9: Depression screen [facility] 2/9 07/31/2015 05/03/2015  Decreased Interest 0 0  Down, Depressed, Hopeless 0 0  PHQ - 2 Score 0 0  Altered sleeping 3 -  Tired, decreased energy 1 -  Change in appetite 0 -  Feeling bad or failure about yourself  0 -  Trouble concentrating 0 -  Moving slowly or fidgety/restless 1 -  Suicidal thoughts 0 -  PHQ-9 Score 5 -  Difficult doing work/chores Not difficult at all -    Quality of Life:   Personal Goals: Goals established at orientation with interventions provided to  work toward goal.    Personal Goals Discharge:     Goals and Risk Factor Review      12/04/15 0737 12/07/15 0931 12/31/15 1035 01/28/16 1301     Core Components/Risk Factors/Patient Goals Review   Personal Goals Review  Hypertension;Diabetes;Lipids  Diabetes;Hypertension;Lipids    Review    I just called and Louis Alexander was lying down resting since his is nauseated from his cancer treatments she said. She said it is hard for him to eat with the nausea. Louis Alexander's wife said she knows he hopes to get back exercising in Cardiac Rehab in a month or so.     Expected Outcomes    Long term to be able to exercise in Cardiac Rehab to help his HTN, Diabetes and Lipids.     Increase Aerobic Exercise and Physical Activity (read-only)   Goals Progress/Improvement seen  Yes  Yes     Comments Louis Alexander is progressing well ,especially considering he was out for several months. The cancer and treatments were affecting his exercise and he is feeling much better now. Until he has more consistent attendance, we will hold off on interval training and adding exercise at home. We will follow up with him in February on how he is feeling with the exercise and add new goals at that time.   Louis Alexander continues to progress despite ongoing health issues, his attendance has been more consistent which has  helped with better exercise progression and him feeling more confident with exercise workloads. Louis Alexander is going to try to do some activity at home on days he is not in class. He will start with one day a week of doing light activity at home. We will follow up with him in the coming weeks on how that home exercise is going.      Diabetes (read-only)   Progress seen towards goals  No      Comments  No change      Hypertension (read-only)   Progress seen toward goals  No      Comments  No change, BP medication is doing its job  and BP readings in class are in appropriate range      Abnormal Lipids (read-only)   Progress seen towards goals  No       Comments  Lipids have not been re-evaluated         Nutrition & Weight - Outcomes:    Nutrition:     Nutrition Therapy & Goals - 12/07/15 0931    Personal Nutrition Goals   Personal Goal #1 Louis Alexander is still doing well with his heart healthy eating and has no concerns or questions      Nutrition Discharge:   Education Questionnaire Score:   Goals reviewed with patient; copy given to patient.

## 2016-05-06 NOTE — Telephone Encounter (Signed)
Discharged from [facility].

## 2016-05-06 NOTE — Telephone Encounter (Signed)
Asking if the papers they dropped off are ready and would like a copy of his PET scan when he comes in. Please call her

## 2016-05-07 DIAGNOSIS — D649 Anemia, unspecified: Secondary | ICD-10-CM | POA: Diagnosis not present

## 2016-05-07 DIAGNOSIS — C679 Malignant neoplasm of bladder, unspecified: Secondary | ICD-10-CM | POA: Diagnosis not present

## 2016-05-07 DIAGNOSIS — C61 Malignant neoplasm of prostate: Secondary | ICD-10-CM | POA: Diagnosis not present

## 2016-05-07 DIAGNOSIS — E119 Type 2 diabetes mellitus without complications: Secondary | ICD-10-CM | POA: Diagnosis not present

## 2016-05-21 ENCOUNTER — Other Ambulatory Visit: Payer: Self-pay | Admitting: *Deleted

## 2016-05-21 ENCOUNTER — Telehealth: Payer: Self-pay

## 2016-05-21 MED ORDER — FENTANYL 100 MCG/HR TD PT72: 100.0000 ug | MEDICATED_PATCH | TRANSDERMAL | Status: DC

## 2016-05-21 NOTE — Telephone Encounter (Signed)
Refill for requested medication sent to pharmacy by Argentina Donovan, RN. Patient notified.

## 2016-05-22 ENCOUNTER — Ambulatory Visit (INDEPENDENT_AMBULATORY_CARE_PROVIDER_SITE_OTHER): Payer: PPO | Admitting: Family Medicine

## 2016-05-22 ENCOUNTER — Encounter: Payer: Self-pay | Admitting: Family Medicine

## 2016-05-22 VITALS — BP 100/62 | HR 57 | Temp 97.8°F | Resp 16 | Wt 154.0 lb

## 2016-05-22 DIAGNOSIS — I25119 Atherosclerotic heart disease of native coronary artery with unspecified angina pectoris: Secondary | ICD-10-CM

## 2016-05-22 DIAGNOSIS — C679 Malignant neoplasm of bladder, unspecified: Secondary | ICD-10-CM

## 2016-05-22 DIAGNOSIS — E1165 Type 2 diabetes mellitus with hyperglycemia: Secondary | ICD-10-CM

## 2016-05-22 DIAGNOSIS — E43 Unspecified severe protein-calorie malnutrition: Secondary | ICD-10-CM

## 2016-05-22 DIAGNOSIS — IMO0001 Reserved for inherently not codable concepts without codable children: Secondary | ICD-10-CM

## 2016-05-22 DIAGNOSIS — C61 Malignant neoplasm of prostate: Secondary | ICD-10-CM

## 2016-05-22 LAB — POCT GLYCOSYLATED HEMOGLOBIN (HGB A1C)
ESTIMATED AVERAGE GLUCOSE: 163
HEMOGLOBIN A1C: 7.3

## 2016-05-22 NOTE — Progress Notes (Signed)
Patient: Louis Alexander Male    DOB: 12/27/1940   75 y.o.   MRN: [id number] Visit Date: 05/22/2016  Today's Provider: Lelon Huh, MD   Chief Complaint  Patient presents with  . Hypoglycemia  . Diabetes   Subjective:    HPI  Diabetes Mellitus Type II, Follow-up:   Lab Results  Component Value Date   HGBA1C 6.3 02/25/2016   HGBA1C 9.2 09/12/2015   HGBA1C 7.7 06/12/2015    Last seen for Hypoglycemia and diabetes 6 weeks ago.  Management since then includes restarting Metformin 500mg  daily instead of twice daily. We also decreased Glipizide from 10mg  daily to 5mg  daily.  He reports good compliance with treatment. He is not having side effects.  Current symptoms include weakness and have been stable. Home blood sugar records: fasting range: 85-199  Episodes of hypoglycemia? yes    Current Insulin Regimen: none Most Recent Eye Exam: 1 year or less Weight trend: fluctuating  Prior visit with dietician: no Current diet: in general, a "healthy" diet   Current exercise: none  Pertinent Labs:    Component Value Date/Time   CHOL 189 12/22/2014   CHOL 134 10/26/2012 0131   TRIG 165* 12/22/2014   TRIG 284* 10/26/2012 0131   HDL 41 12/22/2014   HDL 36* 10/26/2012 0131   LDLCALC 115 12/22/2014   LDLCALC 41 10/26/2012 0131   CREATININE 1.99* 04/29/2016 1320   CREATININE 0.96 03/15/2015 0930   CREATININE 1.0 12/22/2014    Wt Readings from Last 3 Encounters:  04/29/16 157 lb 10.1 oz (71.5 kg)  04/15/16 166 lb 14.2 oz (75.7 kg)  04/11/16 172 lb (78.019 kg)    ------------------------------------------------------------------------  He is no longer receiving chemotherapy for metastatic bladder cancer and is receiving home hospice care. He has very little energy, but pain is well controlled.      Allergies  Allergen Reactions  . Celebrex [Celecoxib] Other (See Comments)    Hematuria Other reaction(s): Bleeding Bleeding in bladder  . Effient  [Prasugrel] Other (See Comments)    blood clots, blood in urine Other reaction(s): Bleeding Bleeding in bladder   Current Meds  Medication Sig  . apixaban (ELIQUIS) 5 MG TABS tablet Take 5 mg by mouth 2 (two) times daily.   [name] aspirin EC 81 MG tablet Take 81 mg by mouth daily.  [name] atenolol (TENORMIN) 25 MG tablet TAKE 1 TABLET(S) BY MOUTH DAILY  . feeding supplement, ENSURE ENLIVE, (ENSURE ENLIVE) LIQD Take 237 mLs by mouth 3 (three) times daily between meals.  . fentaNYL (DURAGESIC - DOSED MCG/HR) 100 MCG/HR Place 1 patch (100 mcg total) onto the skin every 3 (three) days.  . furosemide (LASIX) 20 MG tablet Take 20 mg by mouth daily.   [name] glipiZIDE (GLUCOTROL) 10 MG tablet Take 5 mg by mouth daily.   . megestrol (MEGACE ES) 625 MG/5ML suspension Take 625 mg by mouth daily.   . metFORMIN (GLUCOPHAGE-XR) 500 MG 24 hr tablet Take 500 mg by mouth daily.   . ONE TOUCH ULTRA TEST test strip   . pantoprazole (PROTONIX) 40 MG tablet Take 40 mg by mouth 2 (two) times daily.   . potassium chloride (K-DUR) 10 MEQ tablet Take 10 mEq by mouth daily.   . sotalol (BETAPACE) 80 MG tablet Take 80 mg by mouth daily.   [name] voriconazole (VFEND) 200 MG tablet Take 200 mg by mouth 2 (two) times daily.  . [DISCONTINUED] Co-Enzyme Q-10 30 MG CAPS Take by  mouth.  . [DISCONTINUED] Multiple Vitamin (MULTIVITAMIN) capsule Take by mouth.  . [DISCONTINUED] ondansetron (ZOFRAN) 8 MG tablet Take by mouth.  . [DISCONTINUED] ondansetron (ZOFRAN-ODT) 4 MG disintegrating tablet   . [DISCONTINUED] prasugrel (EFFIENT) 10 MG TABS tablet Take by mouth.  . [DISCONTINUED] predniSONE (STERAPRED UNI-PAK 21 TAB) 10 MG (21) TBPK tablet Take 1 tablet (10 mg total) by mouth daily. Taper as directed  . [DISCONTINUED] prochlorperazine (COMPAZINE) 10 MG tablet Take by mouth.    Review of Systems  Constitutional: Positive for fatigue. Negative for fever, chills and appetite change.  Respiratory: Negative for chest tightness, shortness of  breath and wheezing.   Cardiovascular: Negative for chest pain and palpitations.  Gastrointestinal: Negative for nausea, vomiting and abdominal pain.  Neurological: Positive for weakness.  Psychiatric/Behavioral:       Falls asleep easliy    Social History  Substance Use Topics  . Smoking status: Never Smoker   . Smokeless tobacco: Never Used  . Alcohol Use: No   Objective:   BP 100/62 mmHg  Pulse 57  Temp(Src) 97.8 F (36.6 C) (Oral)  Resp 16  Wt 154 lb (69.854 kg)  SpO2 100%  Physical Exam   General Appearance:    Alert, cooperative, very fatigued and generally weak. In no acute distress   Eyes:    PERRL, conjunctiva/corneas clear, EOM's intact       Lungs:     Clear to auscultation bilaterally, respirations unlabored  Heart:    Regular rate and rhythm  Neurologic:   Awake, alert, oriented x 3. No apparent focal neurological           defect.       Results for orders placed or performed in visit on 05/22/16  POCT HgB A1C  Result Value Ref Range   Hemoglobin A1C 7.3    Est. average glucose Bld gHb Est-mCnc 163        Assessment & Plan:     1. Uncontrolled type 2 diabetes mellitus without complication, without long-term current use of insulin ([place]) Discussed with patient that primary goal is quality of life and I would only be concerned if sugars get consistently above 300, or if he develops hypoglycemia symptoms. Continue current diabetic medications for now - POCT HgB A1C  2. Malignant neoplasm of urinary bladder, unspecified site [facility]) Continue home hospice.   3. Prostate cancer metastatic to multiple sites ([place])   4. Protein-calorie malnutrition, severe   5. Coronary artery disease involving native coronary artery of native heart with angina pectoris [facility]) He has follow up scheduled with cardiology, Dr. Humphrey Rolls later today. Recommend they discuss need for betablockers considering he is extremely fatigued and somewhat hyPOtensive.        Lelon Huh, MD   [facility]

## 2016-06-04 ENCOUNTER — Other Ambulatory Visit: Payer: Self-pay | Admitting: *Deleted

## 2016-06-04 ENCOUNTER — Telehealth: Payer: Self-pay | Admitting: *Deleted

## 2016-06-04 MED ORDER — FENTANYL 100 MCG/HR TD PT72: 100.0000 ug | MEDICATED_PATCH | TRANSDERMAL | Status: DC

## 2016-06-04 NOTE — Telephone Encounter (Signed)
Patient called in to request refill on Fentanyl patches. Patient is currently on hospice services and can only receive 15 day supply at one time, new prescription for fentanyl patches will be faxed to patients pharmacy. Estill Bamberg from [facility] also called regarding orders that need to be signed. I updated their information on file so they now have the correct fax number, will have hospice orders signed by on call physician and fax back today.

## 2016-06-17 ENCOUNTER — Telehealth: Payer: Self-pay

## 2016-06-17 ENCOUNTER — Other Ambulatory Visit: Payer: Self-pay

## 2016-06-17 DIAGNOSIS — C801 Malignant (primary) neoplasm, unspecified: Secondary | ICD-10-CM

## 2016-06-17 MED ORDER — FENTANYL 100 MCG/HR TD PT72: 100.0000 ug | MEDICATED_PATCH | TRANSDERMAL | 0 refills | Status: DC

## 2016-06-17 NOTE — Telephone Encounter (Signed)
Rx will be left at front desk

## 2016-07-03 ENCOUNTER — Observation Stay
Admission: EM | Admit: 2016-07-03 | Discharge: 2016-07-04 | Disposition: A | Discharge status: Home / Self Care | Payer: PPO | Attending: Internal Medicine | Admitting: Internal Medicine

## 2016-07-03 DIAGNOSIS — J90 Pleural effusion, not elsewhere classified: Secondary | ICD-10-CM | POA: Diagnosis not present

## 2016-07-03 DIAGNOSIS — C782 Secondary malignant neoplasm of pleura: Secondary | ICD-10-CM | POA: Diagnosis not present

## 2016-07-03 DIAGNOSIS — R339 Retention of urine, unspecified: Secondary | ICD-10-CM | POA: Diagnosis not present

## 2016-07-03 DIAGNOSIS — L89623 Pressure ulcer of left heel, stage 3: Secondary | ICD-10-CM | POA: Insufficient documentation

## 2016-07-03 DIAGNOSIS — D696 Thrombocytopenia, unspecified: Secondary | ICD-10-CM | POA: Diagnosis not present

## 2016-07-03 DIAGNOSIS — E11649 Type 2 diabetes mellitus with hypoglycemia without coma: Secondary | ICD-10-CM | POA: Diagnosis not present

## 2016-07-03 DIAGNOSIS — G4730 Sleep apnea, unspecified: Secondary | ICD-10-CM | POA: Diagnosis not present

## 2016-07-03 DIAGNOSIS — Z800 Family history of malignant neoplasm of digestive organs: Secondary | ICD-10-CM | POA: Insufficient documentation

## 2016-07-03 DIAGNOSIS — I252 Old myocardial infarction: Secondary | ICD-10-CM | POA: Diagnosis not present

## 2016-07-03 DIAGNOSIS — E871 Hypo-osmolality and hyponatremia: Secondary | ICD-10-CM | POA: Diagnosis not present

## 2016-07-03 DIAGNOSIS — R079 Chest pain, unspecified: Secondary | ICD-10-CM

## 2016-07-03 DIAGNOSIS — N179 Acute kidney failure, unspecified: Secondary | ICD-10-CM | POA: Diagnosis not present

## 2016-07-03 DIAGNOSIS — D62 Acute posthemorrhagic anemia: Secondary | ICD-10-CM | POA: Diagnosis not present

## 2016-07-03 DIAGNOSIS — I25110 Atherosclerotic heart disease of native coronary artery with unstable angina pectoris: Secondary | ICD-10-CM | POA: Diagnosis not present

## 2016-07-03 DIAGNOSIS — I4891 Unspecified atrial fibrillation: Secondary | ICD-10-CM | POA: Diagnosis not present

## 2016-07-03 DIAGNOSIS — E43 Unspecified severe protein-calorie malnutrition: Secondary | ICD-10-CM | POA: Insufficient documentation

## 2016-07-03 DIAGNOSIS — R310 Gross hematuria: Secondary | ICD-10-CM | POA: Insufficient documentation

## 2016-07-03 DIAGNOSIS — Z7982 Long term (current) use of aspirin: Secondary | ICD-10-CM | POA: Insufficient documentation

## 2016-07-03 DIAGNOSIS — Z96641 Presence of right artificial hip joint: Secondary | ICD-10-CM | POA: Insufficient documentation

## 2016-07-03 DIAGNOSIS — Z823 Family history of stroke: Secondary | ICD-10-CM | POA: Insufficient documentation

## 2016-07-03 DIAGNOSIS — I200 Unstable angina: Secondary | ICD-10-CM | POA: Diagnosis present

## 2016-07-03 DIAGNOSIS — C672 Malignant neoplasm of lateral wall of bladder: Secondary | ICD-10-CM | POA: Diagnosis not present

## 2016-07-03 DIAGNOSIS — E785 Hyperlipidemia, unspecified: Secondary | ICD-10-CM | POA: Insufficient documentation

## 2016-07-03 DIAGNOSIS — C61 Malignant neoplasm of prostate: Secondary | ICD-10-CM | POA: Insufficient documentation

## 2016-07-03 DIAGNOSIS — Z794 Long term (current) use of insulin: Secondary | ICD-10-CM | POA: Insufficient documentation

## 2016-07-03 DIAGNOSIS — G9009 Other idiopathic peripheral autonomic neuropathy: Secondary | ICD-10-CM | POA: Diagnosis not present

## 2016-07-03 DIAGNOSIS — Z801 Family history of malignant neoplasm of trachea, bronchus and lung: Secondary | ICD-10-CM | POA: Insufficient documentation

## 2016-07-03 DIAGNOSIS — Z8249 Family history of ischemic heart disease and other diseases of the circulatory system: Secondary | ICD-10-CM | POA: Insufficient documentation

## 2016-07-03 DIAGNOSIS — Z888 Allergy status to other drugs, medicaments and biological substances status: Secondary | ICD-10-CM | POA: Insufficient documentation

## 2016-07-03 DIAGNOSIS — I214 Non-ST elevation (NSTEMI) myocardial infarction: Secondary | ICD-10-CM | POA: Diagnosis not present

## 2016-07-03 DIAGNOSIS — Z7901 Long term (current) use of anticoagulants: Secondary | ICD-10-CM | POA: Insufficient documentation

## 2016-07-03 DIAGNOSIS — B440 Invasive pulmonary aspergillosis: Secondary | ICD-10-CM | POA: Diagnosis not present

## 2016-07-03 DIAGNOSIS — Z86010 Personal history of colonic polyps: Secondary | ICD-10-CM | POA: Insufficient documentation

## 2016-07-03 DIAGNOSIS — I10 Essential (primary) hypertension: Secondary | ICD-10-CM | POA: Diagnosis not present

## 2016-07-03 DIAGNOSIS — Z955 Presence of coronary angioplasty implant and graft: Secondary | ICD-10-CM | POA: Insufficient documentation

## 2016-07-03 DIAGNOSIS — N401 Enlarged prostate with lower urinary tract symptoms: Secondary | ICD-10-CM | POA: Diagnosis not present

## 2016-07-03 DIAGNOSIS — M1990 Unspecified osteoarthritis, unspecified site: Secondary | ICD-10-CM | POA: Insufficient documentation

## 2016-07-03 NOTE — ED Triage Notes (Signed)
Pt presenting with CP and SOB that began 2 hours ago. Pt stating that he took 3 nitro and it decreased his pain from a 10/10 to a 5/10. 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 Pt has port in upper right chest. Last chemo tx was two months. Pt has indwelling catheter in place on arrival.

## 2016-07-04 ENCOUNTER — Emergency Department: Payer: PPO

## 2016-07-04 ENCOUNTER — Encounter: Payer: Self-pay | Admitting: Emergency Medicine

## 2016-07-04 ENCOUNTER — Other Ambulatory Visit: Payer: Self-pay | Admitting: *Deleted

## 2016-07-04 DIAGNOSIS — C801 Malignant (primary) neoplasm, unspecified: Secondary | ICD-10-CM

## 2016-07-04 DIAGNOSIS — I214 Non-ST elevation (NSTEMI) myocardial infarction: Secondary | ICD-10-CM | POA: Diagnosis present

## 2016-07-04 LAB — COMPREHENSIVE METABOLIC PANEL
ALT: 14 U/L — AB (ref 17–63)
ANION GAP: 9 (ref 5–15)
AST: 28 U/L (ref 15–41)
Albumin: 2.6 g/dL — ABNORMAL LOW (ref 3.5–5.0)
Alkaline Phosphatase: 129 U/L — ABNORMAL HIGH (ref 38–126)
BUN: 26 mg/dL — ABNORMAL HIGH (ref 6–20)
CHLORIDE: 100 mmol/L — AB (ref 101–111)
CO2: 23 mmol/L (ref 22–32)
CREATININE: 1.43 mg/dL — AB (ref 0.61–1.24)
Calcium: 8.7 mg/dL — ABNORMAL LOW (ref 8.9–10.3)
GFR calc non Af Amer: 47 mL/min — ABNORMAL LOW (ref >60)
GFR, EST AFRICAN AMERICAN: 55 mL/min — AB (ref >60)
Glucose, Bld: 210 mg/dL — ABNORMAL HIGH (ref 65–99)
POTASSIUM: 4.1 mmol/L (ref 3.5–5.1)
SODIUM: 132 mmol/L — AB (ref 135–145)
Total Bilirubin: 0.4 mg/dL (ref 0.3–1.2)
Total Protein: 5.9 g/dL — ABNORMAL LOW (ref 6.5–8.1)

## 2016-07-04 LAB — GLUCOSE, CAPILLARY
GLUCOSE-CAPILLARY: 84 mg/dL (ref 65–99)
Glucose-Capillary: 176 mg/dL — ABNORMAL HIGH (ref 65–99)

## 2016-07-04 LAB — BASIC METABOLIC PANEL WITH GFR
Anion gap: 8 (ref 5–15)
BUN: 24 mg/dL — ABNORMAL HIGH (ref 6–20)
CO2: 23 mmol/L (ref 22–32)
Calcium: 8.5 mg/dL — ABNORMAL LOW (ref 8.9–10.3)
Chloride: 103 mmol/L (ref 101–111)
Creatinine, Ser: 1.24 mg/dL (ref 0.61–1.24)
GFR calc Af Amer: >60 mL/min
GFR calc non Af Amer: 56 mL/min — ABNORMAL LOW
Glucose, Bld: 103 mg/dL — ABNORMAL HIGH (ref 65–99)
Potassium: 3.8 mmol/L (ref 3.5–5.1)
Sodium: 134 mmol/L — ABNORMAL LOW (ref 135–145)

## 2016-07-04 LAB — CBC WITH DIFFERENTIAL/PLATELET
BASOS ABS: 0.1 10*3/uL (ref 0–0.1)
BASOS PCT: 1 %
Eosinophils Absolute: 0.4 10*3/uL (ref 0–0.7)
Eosinophils Relative: 3 %
HCT: 31.6 % — ABNORMAL LOW (ref 40.0–52.0)
HEMOGLOBIN: 10.5 g/dL — AB (ref 13.0–18.0)
LYMPHS PCT: 21 %
Lymphs Abs: 2.7 10*3/uL (ref 1.0–3.6)
MCH: 29.3 pg (ref 26.0–34.0)
MCHC: 33.4 g/dL (ref 32.0–36.0)
MCV: 87.7 fL (ref 80.0–100.0)
MONOS PCT: 10 %
Monocytes Absolute: 1.2 10*3/uL — ABNORMAL HIGH (ref 0.2–1.0)
NEUTROS ABS: 8.3 10*3/uL — AB (ref 1.4–6.5)
NEUTROS PCT: 65 %
PLATELETS: 339 10*3/uL (ref 150–440)
RBC: 3.6 MIL/uL — ABNORMAL LOW (ref 4.40–5.90)
RDW: 16.8 % — AB (ref 11.5–14.5)
WBC: 12.7 10*3/uL — ABNORMAL HIGH (ref 3.8–10.6)

## 2016-07-04 LAB — CBC
HCT: 28.2 % — ABNORMAL LOW (ref 40.0–52.0)
Hemoglobin: 9.7 g/dL — ABNORMAL LOW (ref 13.0–18.0)
MCH: 29.8 pg (ref 26.0–34.0)
MCHC: 34.3 g/dL (ref 32.0–36.0)
MCV: 86.8 fL (ref 80.0–100.0)
Platelets: 317 K/uL (ref 150–440)
RBC: 3.25 MIL/uL — ABNORMAL LOW (ref 4.40–5.90)
RDW: 16.8 % — ABNORMAL HIGH (ref 11.5–14.5)
WBC: 11.9 K/uL — ABNORMAL HIGH (ref 3.8–10.6)

## 2016-07-04 LAB — LIPID PANEL
Cholesterol: 129 mg/dL (ref 0–200)
HDL: 26 mg/dL — ABNORMAL LOW
LDL Cholesterol: 75 mg/dL (ref 0–99)
Total CHOL/HDL Ratio: 5 ratio
Triglycerides: 140 mg/dL
VLDL: 28 mg/dL (ref 0–40)

## 2016-07-04 LAB — TROPONIN I
TROPONIN I: 0.09 ng/mL — AB (ref <0.03)
Troponin I: 0.19 ng/mL
Troponin I: 0.18 ng/mL

## 2016-07-04 LAB — HEPARIN LEVEL (UNFRACTIONATED)

## 2016-07-04 LAB — PROTIME-INR
INR: 1.26
PROTHROMBIN TIME: 15.9 s — AB (ref 11.4–15.2)

## 2016-07-04 LAB — HEMOGLOBIN A1C: HEMOGLOBIN A1C: 7.1 % — AB (ref 4.0–6.0)

## 2016-07-04 LAB — LIPASE, BLOOD: LIPASE: 16 U/L (ref 11–51)

## 2016-07-04 LAB — APTT: APTT: 39 s — AB (ref 24–36)

## 2016-07-04 MED ORDER — OXYCODONE HCL 5 MG PO TABS: 5.0000 mg | ORAL_TABLET | ORAL | 0 refills | Status: AC | PRN

## 2016-07-04 MED ORDER — ASPIRIN EC 81 MG PO TBEC
81.0000 mg | DELAYED_RELEASE_TABLET | Freq: Every day | ORAL | Status: DC
  Administered 2016-07-04: 81 mg via ORAL
  Filled 2016-07-04: qty 1

## 2016-07-04 MED ORDER — HEPARIN (PORCINE) IN NACL 100-0.45 UNIT/ML-% IJ SOLN: 14.0000 [IU]/kg/h | Freq: Once | INTRAMUSCULAR | Status: DC

## 2016-07-04 MED ORDER — POTASSIUM CHLORIDE CRYS ER 10 MEQ PO TBCR
10.0000 meq | EXTENDED_RELEASE_TABLET | Freq: Every day | ORAL | Status: DC
  Administered 2016-07-04: 10 meq via ORAL
  Filled 2016-07-04: qty 1

## 2016-07-04 MED ORDER — INSULIN ASPART 100 UNIT/ML ~~LOC~~ SOLN
0.0000 [IU] | Freq: Three times a day (TID) | SUBCUTANEOUS | Status: DC
  Administered 2016-07-04: 2 [IU] via SUBCUTANEOUS
  Filled 2016-07-04: qty 2

## 2016-07-04 MED ORDER — FENTANYL 50 MCG/HR TD PT72: 100.0000 ug | MEDICATED_PATCH | TRANSDERMAL | Status: DC

## 2016-07-04 MED ORDER — ISOSORBIDE MONONITRATE ER 30 MG PO TB24
30.0000 mg | ORAL_TABLET | Freq: Every day | ORAL | Status: DC
  Administered 2016-07-04: 30 mg via ORAL
  Filled 2016-07-04: qty 1

## 2016-07-04 MED ORDER — ASPIRIN 81 MG PO CHEW: 324.0000 mg | CHEWABLE_TABLET | ORAL | Status: DC

## 2016-07-04 MED ORDER — ATENOLOL 25 MG PO TABS
25.0000 mg | ORAL_TABLET | Freq: Two times a day (BID) | ORAL | Status: DC
  Administered 2016-07-04: 25 mg via ORAL
  Filled 2016-07-04: qty 1

## 2016-07-04 MED ORDER — ASPIRIN 300 MG RE SUPP: 300.0000 mg | RECTAL | Status: DC

## 2016-07-04 MED ORDER — VORICONAZOLE 200 MG PO TABS: 200.0000 mg | ORAL_TABLET | Freq: Two times a day (BID) | ORAL | Status: DC

## 2016-07-04 MED ORDER — MORPHINE SULFATE (PF) 4 MG/ML IV SOLN
4.0000 mg | Freq: Once | INTRAVENOUS | Status: AC
  Administered 2016-07-04: 4 mg via INTRAVENOUS
  Filled 2016-07-04: qty 1

## 2016-07-04 MED ORDER — ISOSORBIDE MONONITRATE ER 30 MG PO TB24: 30.0000 mg | ORAL_TABLET | Freq: Every day | ORAL | 0 refills | Status: AC

## 2016-07-04 MED ORDER — MORPHINE SULFATE (PF) 2 MG/ML IV SOLN
2.0000 mg | INTRAVENOUS | Status: DC | PRN
  Administered 2016-07-04: 2 mg via INTRAVENOUS
  Filled 2016-07-04: qty 1

## 2016-07-04 MED ORDER — HEPARIN SODIUM (PORCINE) 5000 UNIT/ML IJ SOLN: 4000.0000 [IU] | Freq: Once | INTRAMUSCULAR | Status: DC

## 2016-07-04 MED ORDER — SODIUM CHLORIDE 0.9 % IV SOLN: 250.0000 mL | INTRAVENOUS | Status: DC | PRN

## 2016-07-04 MED ORDER — ONDANSETRON HCL 4 MG/2ML IJ SOLN
4.0000 mg | Freq: Once | INTRAMUSCULAR | Status: AC
  Administered 2016-07-04: 4 mg via INTRAVENOUS
  Filled 2016-07-04: qty 2

## 2016-07-04 MED ORDER — FUROSEMIDE 20 MG PO TABS
20.0000 mg | ORAL_TABLET | Freq: Every day | ORAL | Status: DC
  Administered 2016-07-04: 20 mg via ORAL
  Filled 2016-07-04: qty 1

## 2016-07-04 MED ORDER — INSULIN ASPART 100 UNIT/ML ~~LOC~~ SOLN: 0.0000 [IU] | Freq: Every day | SUBCUTANEOUS | Status: DC

## 2016-07-04 MED ORDER — FENTANYL 100 MCG/HR TD PT72: 100.0000 ug | MEDICATED_PATCH | TRANSDERMAL | 0 refills | Status: AC

## 2016-07-04 MED ORDER — ONDANSETRON HCL 4 MG/2ML IJ SOLN: 4.0000 mg | Freq: Four times a day (QID) | INTRAMUSCULAR | Status: DC | PRN

## 2016-07-04 MED ORDER — GLIPIZIDE 5 MG PO TABS: 5.0000 mg | ORAL_TABLET | Freq: Every day | ORAL | Status: DC

## 2016-07-04 MED ORDER — ACETAMINOPHEN 325 MG PO TABS
650.0000 mg | ORAL_TABLET | ORAL | Status: DC | PRN
  Administered 2016-07-04: 650 mg via ORAL
  Filled 2016-07-04: qty 2

## 2016-07-04 MED ORDER — MEGESTROL ACETATE 400 MG/10ML PO SUSP
800.0000 mg | Freq: Every day | ORAL | Status: DC
  Filled 2016-07-04: qty 20

## 2016-07-04 MED ORDER — ASPIRIN EC 81 MG PO TBEC: 81.0000 mg | DELAYED_RELEASE_TABLET | Freq: Every day | ORAL | Status: DC

## 2016-07-04 MED ORDER — SODIUM CHLORIDE 0.9% FLUSH
3.0000 mL | Freq: Two times a day (BID) | INTRAVENOUS | Status: DC
  Administered 2016-07-04 (×2): 3 mL via INTRAVENOUS

## 2016-07-04 MED ORDER — NITROGLYCERIN 0.4 MG SL SUBL: 0.4000 mg | SUBLINGUAL_TABLET | SUBLINGUAL | Status: DC | PRN

## 2016-07-04 MED ORDER — OXYCODONE HCL 5 MG PO TABS
5.0000 mg | ORAL_TABLET | ORAL | Status: DC | PRN
Start: 2016-07-04 — End: 2016-07-04
  Administered 2016-07-04: 5 mg via ORAL
  Filled 2016-07-04: qty 1

## 2016-07-04 MED ORDER — SOTALOL HCL 80 MG PO TABS
80.0000 mg | ORAL_TABLET | Freq: Two times a day (BID) | ORAL | Status: DC
  Administered 2016-07-04: 80 mg via ORAL
  Filled 2016-07-04: qty 1

## 2016-07-04 MED ORDER — FENTANYL 100 MCG/HR TD PT72: 100.0000 ug | MEDICATED_PATCH | TRANSDERMAL | 0 refills | Status: DC

## 2016-07-04 MED ORDER — HEPARIN (PORCINE) IN NACL 100-0.45 UNIT/ML-% IJ SOLN
900.0000 [IU]/h | INTRAMUSCULAR | Status: DC
  Administered 2016-07-04: 900 [IU]/h via INTRAVENOUS
  Filled 2016-07-04: qty 250

## 2016-07-04 MED ORDER — HEPARIN BOLUS VIA INFUSION
4000.0000 [IU] | Freq: Once | INTRAVENOUS | Status: AC
  Administered 2016-07-04: 4000 [IU] via INTRAVENOUS
  Filled 2016-07-04: qty 4000

## 2016-07-04 MED ORDER — ENSURE ENLIVE PO LIQD
237.0000 mL | Freq: Three times a day (TID) | ORAL | Status: DC
  Administered 2016-07-04: 237 mL via ORAL

## 2016-07-04 MED ORDER — SODIUM CHLORIDE 0.9% FLUSH: 3.0000 mL | INTRAVENOUS | Status: DC | PRN

## 2016-07-04 MED ORDER — PANTOPRAZOLE SODIUM 40 MG PO TBEC
40.0000 mg | DELAYED_RELEASE_TABLET | Freq: Two times a day (BID) | ORAL | Status: DC
  Administered 2016-07-04: 40 mg via ORAL
  Filled 2016-07-04: qty 1

## 2016-07-04 NOTE — Consult Note (Signed)
[facility] Nurse wound consult note Reason for Consult: Stage 3 pressure injury to left medial heel. Present on admission. Began as serum filled blister and wife states she ruptured blister.  Resolving stage 2 pressure injury to sacrum.  1 cm x 0.5 cm x 0.1 cm  Silicone border foam dressing in place.  Wound type:Stage 3 pressure injury Pressure Ulcer POA: Yes Measurement:3 cm x 4 cm x 0.2 cm circumferentially around heel.  Wound [id number] and moist.  Sloughing epithelium from blister.  Drainage (amount, consistency, odor) Minimal serosanguinous.  No odor.  Periwound:Blanchable erythema to heel.  Dressing procedure/placement/frequency:Cleanse left heel with soap and water and pat gently dry.  Apply silicone border foam dressing to heel.  Change every 3 days and PRN soilage.  Will not follow at this time.  Please re-consult if needed.  Domenic Moras RN BSN [place] Pager [phone]

## 2016-07-04 NOTE — Progress Notes (Addendum)
ANTICOAGULATION CONSULT NOTE - Initial Consult  Pharmacy Consult for heparin Indication: ACS/positive troponin  Allergies  Allergen Reactions  . Celebrex [Celecoxib] Other (See Comments)    Hematuria Other reaction(s): Bleeding Bleeding in bladder  . Effient [Prasugrel] Other (See Comments)    blood clots, blood in urine Other reaction(s): Bleeding Bleeding in bladder    Patient Measurements: Height: 5\' 11"  (180.3 cm) Weight: 165 lb (74.8 kg) IBW/kg (Calculated) : 75.3 Heparin Dosing Weight: 74.8 kg  Vital Signs: Temp: 98.5 F (36.9 C) (08/18 0001) BP: 124/77 (08/18 0145) Pulse Rate: 84 (08/18 0145)  Labs:  Recent Labs  07/04/16 0051  HGB 10.5*  HCT 31.6*  PLT 339  CREATININE 1.43*  TROPONINI 0.09*    Estimated Creatinine Clearance: 48.7 mL/min (by C-G formula based on SCr of 1.43 mg/dL).   Medical History: Past Medical History:  Diagnosis Date  . Abnormal EKG   . Arthritis    oa  . Bladder cancer ([place])   . Cancer [facility])    bladder and prostate  . Cancer ([facility])    bladder, penis, bone, lung ([place])  . Coronary artery disease   . Diabetes mellitus without complication ([place])   . History of chemotherapy 2014  . History of chicken pox   . History of radiation therapy 2012  . Hyperlipidemia   . Hypertension   . Myocardial infarction ([place]) 2008 and 2012   x 2  . Obesity   . Sleep apnea    could not tolerate cpap mask    Medications:  Infusions:  . heparin      Assessment: 74 yom cc pain with positive troponin and cardiac history. Pharmacy consulted to dose heparin for ACS. Patient takes Apixaban at home.  Baseline PT 15.9 INR 1.26 HL >3.6  Goal of Therapy:  Heparin level 0.3-0.7 units/ml Monitor platelets by anticoagulation protocol: Yes   Plan:  Give 400 units bolus x 1 Start heparin infusion at 900 units/hr Check aPTT level in 6 hours and daily while on heparin Continue to monitor H&H and platelets  Laural Benes, Pharm.D.,  BCPS Clinical Pharmacist 07/04/2016,2:17 AM

## 2016-07-04 NOTE — Discharge Summary (Signed)
[facility] NAME: Louis Alexander    MR#:  [id number]  DATE OF BIRTH:  [birth year]  DATE OF ADMISSION:  07/03/2016 ADMITTING PHYSICIAN: Louis Shelling, MD  DATE OF DISCHARGE: 07/04/16  PRIMARY CARE PHYSICIAN: Louis Huh, MD    ADMISSION DIAGNOSIS:   NSTEMI (non-ST elevated myocardial infarction) ([place]) [I21.4] Chest pain, unspecified chest pain type [R07.9]  DISCHARGE DIAGNOSIS:  Active Problems:   Non-STEMI (non-ST elevated myocardial infarction) ([place])   SECONDARY DIAGNOSIS:   Past Medical History:  Diagnosis Date  . Abnormal EKG   . Arthritis    oa  . Bladder cancer ([place])   . Cancer [facility])    bladder and prostate  . Cancer (H[facility])    bladder, penis, bone, lung ([place])  . Coronary artery disease   . Diabetes mellitus without complication ([place])   . History of chemotherapy 2014  . History of chicken pox   . History of radiation therapy 2012  . Hyperlipidemia   . Hypertension   . Myocardial infarction ([place]) 2008 and 2012   x 2  . Obesity   . Sleep apnea    could not tolerate cpap mask    HOSPITAL COURSE:  Louis Alexander  is a 75 y.o. male admitted 07/03/2016 with chief complaint Chest Pain . Please see H&P performed by Louis Shelling, MD for further information. Patient presented with chest pain, mild elevation in troponin. Cardiology evaluated the patient. The patient is already at end stage of life followed by home hospice for cancer. With this knowledge he did not want further work up or treatment. Discussed the options of medical management including aspirin and nitroglycerin. He, with wife at bedside, decline aspirin therapy given bleeding issues secondary to his cancer. Pain free on discharge day  DISCHARGE CONDITIONS:   stable  CONSULTS OBTAINED:  Treatment Team:  Louis Alexander , MD  DRUG ALLERGIES:   Allergies  Allergen Reactions  . Celebrex [Celecoxib] Other (See Comments)    Hematuria Other  reaction(s): Bleeding Bleeding in bladder  . Effient [Prasugrel] Other (See Comments)    blood clots, blood in urine Other reaction(s): Bleeding Bleeding in bladder    DISCHARGE MEDICATIONS:   Current Discharge Medication List    START taking these medications   Details  isosorbide mononitrate (IMDUR) 30 MG 24 hr tablet Take 1 tablet (30 mg total) by mouth daily. Qty: 30 tablet, Refills: 0    oxyCODONE (OXY IR/ROXICODONE) 5 MG immediate release tablet Take 1 tablet (5 mg total) by mouth every 4 (four) hours as needed for moderate pain. Qty: 30 tablet, Refills: 0      CONTINUE these medications which have NOT CHANGED   Details  apixaban (ELIQUIS) 5 MG TABS tablet Take 5 mg by mouth 2 (two) times daily.     atenolol (TENORMIN) 25 MG tablet TAKE 1 TABLET(S) BY MOUTH DAILY Qty: 90 tablet, Refills: 3    feeding supplement, ENSURE ENLIVE, (ENSURE ENLIVE) LIQD Take 237 mLs by mouth 3 (three) times daily between meals. Qty: 90 Bottle, Refills: 0    fentaNYL (DURAGESIC - DOSED MCG/HR) 100 MCG/HR Place 1 patch (100 mcg total) onto the skin every 3 (three) days. Qty: 5 patch, Refills: 0   Associated Diagnoses: Cancer ([place])    furosemide (LASIX) 20 MG tablet Take 20 mg by mouth daily.     glipiZIDE (GLUCOTROL) 10 MG tablet Take 5 mg by mouth daily.     metFORMIN (GLUCOPHAGE-XR) 500 MG  24 hr tablet Take 500 mg by mouth daily.     ONE TOUCH ULTRA TEST test strip     pantoprazole (PROTONIX) 40 MG tablet Take 40 mg by mouth daily.     potassium chloride (K-DUR) 10 MEQ tablet Take 10 mEq by mouth daily.     sotalol (BETAPACE) 80 MG tablet Take 80 mg by mouth daily.       STOP taking these medications     aspirin EC 81 MG tablet      megestrol (MEGACE ES) 625 MG/5ML suspension          DISCHARGE INSTRUCTIONS:    DIET:  Regular diet  DISCHARGE CONDITION:  Stable  ACTIVITY:  Activity as tolerated  OXYGEN:  Home Oxygen: No.   Oxygen Delivery: room  air  DISCHARGE LOCATION:  home with hospice  If you experience worsening of your admission symptoms, develop shortness of breath, life threatening emergency, suicidal or homicidal thoughts you must seek medical attention immediately by calling 911 or calling your MD immediately  if symptoms less severe.  You Must read complete instructions/literature along with all the possible adverse reactions/side effects for all the Medicines you take and that have been prescribed to you. Take any new Medicines after you have completely understood and accpet all the possible adverse reactions/side effects.   Please note  You were cared for by a hospitalist during your hospital stay. If you have any questions about your discharge medications or the care you received while you were in the hospital after you are discharged, you can call the unit and asked to speak with the hospitalist on call if the hospitalist that took care of you is not available. Once you are discharged, your primary care physician will handle any further medical issues. Please note that NO REFILLS for any discharge medications will be authorized once you are discharged, as it is imperative that you return to your primary care physician (or establish a relationship with a primary care physician if you do not have one) for your aftercare needs so that they can reassess your need for medications and monitor your lab values.    On the day of Discharge:   VITAL SIGNS:  Blood pressure 135/76, pulse 80, temperature 98.3 F (36.8 C), temperature source Oral, resp. rate 17, height 5\' 11"  (1.803 m), weight 73.5 kg (162 lb), SpO2 99 %.  I/O:   Intake/Output Summary (Last 24 hours) at 07/04/16 0937 Last data filed at 07/04/16 0400  Gross per 24 hour  Intake                0 ml  Output              400 ml  Net             -400 ml    PHYSICAL EXAMINATION:  GENERAL:  75 y.o.-year-old patient lying in the bed with no acute distress.  Chronically ill EYES: Pupils equal, round, reactive to light and accommodation. No scleral icterus. Extraocular muscles intact.  HEENT: Head atraumatic, normocephalic. Oropharynx and nasopharynx clear.  NECK:  Supple, no jugular venous distention. No thyroid enlargement, no tenderness.  LUNGS: Normal breath sounds bilaterally, no wheezing, rales,rhonchi or crepitation. No use of accessory muscles of respiration.  CARDIOVASCULAR: S1, S2 normal. No murmurs, rubs, or gallops.  ABDOMEN: Soft, non-tender, non-distended. Bowel sounds present. No organomegaly or mass.  EXTREMITIES: No pedal edema, cyanosis, or clubbing.  NEUROLOGIC: Cranial nerves II through XII are intact.  Muscle strength 5/5 in all extremities. Sensation intact. Gait not checked.  PSYCHIATRIC: The patient is alert and oriented x 3.  SKIN: No obvious rash, lesion, or ulcer.   DATA REVIEW:   CBC  Recent Labs Lab 07/04/16 0513  WBC 11.9*  HGB 9.7*  HCT 28.2*  PLT 317    Chemistries   Recent Labs Lab 07/04/16 0051 07/04/16 0513  NA 132* 134*  K 4.1 3.8  CL 100* 103  CO2 23 23  GLUCOSE 210* 103*  BUN 26* 24*  CREATININE 1.43* 1.24  CALCIUM 8.7* 8.5*  AST 28  --   ALT 14*  --   ALKPHOS 129*  --   BILITOT 0.4  --     Cardiac Enzymes  Recent Labs Lab 07/04/16 0513  TROPONINI 0.19*    Microbiology Results  Results for orders placed or performed during the hospital encounter of 12/21/13  Surgical pcr screen     Status: None   Collection Time: 12/21/13  8:21 AM  Result Value Ref Range Status   MRSA, PCR NEGATIVE NEGATIVE Final   Staphylococcus aureus NEGATIVE NEGATIVE Final    Comment:        The Xpert SA Assay (FDA approved for NASAL specimens in patients over 68 years of age), is one component of a comprehensive surveillance program.  Test performance has been validated by [employer] for patients greater than or equal to 46 year old. It is not intended to diagnose infection nor to guide or  monitor treatment.  Urine culture     Status: None   Collection Time: 12/21/13  8:21 AM  Result Value Ref Range Status   Specimen Description URINE, CLEAN CATCH  Final   Special Requests NONE  Final   Culture  Setup Time   Final    12/21/2013 13:30 Performed at [place]   Final    >=100,000 COLONIES/ML Performed at [facility]   Culture   Final    PSEUDOMONAS AERUGINOSA KLEBSIELLA PNEUMONIAE Performed at [facility]   Report Status 12/24/2013 FINAL  Final   Organism ID, Bacteria PSEUDOMONAS AERUGINOSA  Final   Organism ID, Bacteria KLEBSIELLA PNEUMONIAE  Final      Susceptibility   Klebsiella pneumoniae - MIC*    AMPICILLIN >=32 RESISTANT Resistant     CEFAZOLIN <=4 SENSITIVE Sensitive     CEFTRIAXONE <=1 SENSITIVE Sensitive     CIPROFLOXACIN <=0.25 SENSITIVE Sensitive     GENTAMICIN <=1 SENSITIVE Sensitive     LEVOFLOXACIN <=0.12 SENSITIVE Sensitive     NITROFURANTOIN 128 RESISTANT Resistant     TOBRAMYCIN <=1 SENSITIVE Sensitive     TRIMETH/SULFA <=20 SENSITIVE Sensitive     PIP/TAZO <=4 SENSITIVE Sensitive     * KLEBSIELLA PNEUMONIAE   Pseudomonas aeruginosa - MIC*    CEFEPIME 2 SENSITIVE Sensitive     CEFTAZIDIME 4 SENSITIVE Sensitive     CIPROFLOXACIN <=0.25 SENSITIVE Sensitive     GENTAMICIN <=1 SENSITIVE Sensitive     IMIPENEM 2 SENSITIVE Sensitive     PIP/TAZO 8 SENSITIVE Sensitive     TOBRAMYCIN* <=1 SENSITIVE Sensitive      * SET UP TIME:  [id number]    * PSEUDOMONAS AERUGINOSA    RADIOLOGY:  Dg Chest Port 1 View  Result Date: 07/04/2016 CLINICAL DATA:  Acute onset of generalized chest pain and shortness of breath. Initial encounter. EXAM: PORTABLE CHEST 1 VIEW COMPARISON:  Chest radiograph performed 04/08/2016, and PET/CT performed 04/24/2016  FINDINGS: The lungs are well-aerated. A small to moderate right-sided pleural effusion is noted. A left midlung nodule is again noted, as on prior PET/CT. 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 The cardiomediastinal silhouette is borderline normal in size. A right-sided chest port is noted ending about the distal SVC. No acute osseous abnormalities are seen. The patient is status post left-sided rotator cuff repair. IMPRESSION: Small to moderate right-sided pleural effusion noted. Left midlung nodule again noted, as on prior PET/CT. 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 Electronically Signed   By: Garald Balding M.D.   On: 07/04/2016 00:43     Management plans discussed with the patient, family and they are in agreement.  CODE STATUS:     Code Status Orders        Start     Ordered   07/04/16 [phone]  Do not attempt resuscitation (DNR)  Continuous    Question Answer Comment  In the event of cardiac or respiratory ARREST Do not call a "code blue"   In the event of cardiac or respiratory ARREST Do not perform Intubation, CPR, defibrillation or ACLS   In the event of cardiac or respiratory ARREST Use medication by any route, position, wound care, and other measures to relive pain and suffering. May use oxygen, suction and manual treatment of airway obstruction as needed for comfort.      07/04/16 0336    Code Status History    Date Active Date Inactive Code Status Order ID Comments User Context   04/08/2016  7:41 PM 04/09/2016  5:10 PM Full Code [id number]  Vaughan Basta, MD Inpatient   12/03/2015  2:47 PM 12/04/2015  3:30 AM Full Code [id number]  Sabino Dick, MD HOV   09/25/2015  4:14 PM 09/30/2015  8:44 PM Full Code [id number]  Bettey Costa, MD Inpatient   06/04/2015  9:09 AM 06/05/2015  3:41 PM Full Code [id number]  Wellington Hampshire, MD Inpatient   06/01/2015  2:29 AM 06/02/2015  3:53 PM Full Code [id number]  Lytle Butte, MD ED   12/27/2013  5:25 PM 12/28/2013  9:57 PM Full Code [id number]  Pricilla Loveless, PA-C Inpatient    Advance Directive Documentation   Flowsheet Row Most  Recent Value  Type of Advance Directive  Living will, Healthcare Power of Attorney  Pre-existing out of facility DNR order (yellow form or pink MOST form)  No data  "MOST" Form in Place?  No data      TOTAL TIME TAKING CARE OF THIS PATIENT: 33 minutes.    Jeneva Schweizer,  Karenann Cai.D on 07/04/2016 at 9:37 AM  Between 7am to 6pm - Pager - [phone]  After 6pm go to [email or web address] - Proofreader  [facility] [place] [facility]  Office  [phone]  CC: Primary care physician; Louis Huh, MD

## 2016-07-04 NOTE — Care Management (Signed)
Patient presents from home with chest pain. Cardiology to consult.  His second troponin elevated at .19.  He is followed at home by  [facility].  Agency is aware that patient has been placed in observation at [facility]

## 2016-07-04 NOTE — Care Management (Addendum)
Was contacted by [facility] and informed that the non emergent transport does not require authorization.  Copay is 200 dollars.  Informed patient and wife.  Wife will be present to accept patient in the home by ems.  Notified [facility] of discharge and faxed discharge summary

## 2016-07-04 NOTE — ED Provider Notes (Signed)
[facility] Emergency Department Provider Note   ____________________________________________   First MD Initiated Contact with Patient 07/04/16 [phone]     (approximate)  I have reviewed the triage vital signs and the nursing notes.   HISTORY  Chief Complaint Chest Pain    HPI Louis Alexander is a 75 y.o. male who presents to the ED from home via EMS with a chief complain of chest pain. 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 Recently enrolled in hospice. Complains ofleft-sided chest pain approximately 10 PM associated with shortness of breath. Denies diaphoresis, nausea, vomiting, palpitations or dizziness. Patient reports taking a total of 3 nitroglycerin which brought his pain from 10/10 to currently 2/10. States aspirin was discontinued 1 week ago and he was started on Eliquis; he thinks for blood clot in his lung. Denies recent fever, chills, cough, congestion, abdominal pain, nausea, vomiting, diarrhea. Denies recent trauma or travel.   Past Medical History:  Diagnosis Date  . Abnormal EKG   . Arthritis    oa  . Bladder cancer ([place])   . Cancer [facility])    bladder and prostate  . Cancer (HCC)    bladder, penis, bone, lung ([place])  . Coronary artery disease   . Diabetes mellitus without complication ([place])   . History of chemotherapy 2014  . History of chicken pox   . History of radiation therapy 2012  . Hyperlipidemia   . Hypertension   . Myocardial infarction ([place]) 2008 and 2012   x 2  . Obesity   . Sleep apnea    could not tolerate cpap mask    Patient Active Problem List   Diagnosis Date Noted  . Protein-calorie malnutrition, severe 04/09/2016  . Hypoglycemia 04/08/2016  . 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 DVT (deep venous thrombosis) ([place]) 09/29/2015  .  Hematuria 09/29/2015  . S/P IVC filter 09/29/2015  . Guaiac positive stools 09/29/2015  . Acute posthemorrhagic anemia 09/29/2015  . Blood transfusion during current hospitalization 09/29/2015  . 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 Hyponatremia 09/29/2015  . Acute renal failure ([place]) 09/29/2015  . Thrombocytopenia ([place]) 09/29/2015  . Pulmonary emboli ([place]) 09/25/2015  . Pulmonary embolism ([place]) 09/25/2015  . Atrial fibrillation ([place]) 09/13/2015  . Bladder cancer ([place]) 06/11/2015  . Dyslipidemia 06/11/2015  . Arthralgia of hip 06/11/2015  . History of myocardial infarction 06/11/2015  . Degenerative arthritis of hip 06/11/2015  . Malignant neoplasm of urinary bladder ([place]) 06/11/2015  . Urothelial cancer ([place]) 03/16/2015  . Malignant neoplasm of urinary organ ([place]) 03/16/2015  . S/P right THA, AA 12/27/2013  . Cancer of lateral wall of urinary bladder ([place]) 01/28/2013  . Malignant neoplasm of lateral wall of urinary bladder ([place]) 01/28/2013  . Prostate cancer ([place]) 01/14/2013  . Incomplete bladder emptying 01/14/2013  . Frank hematuria 01/14/2013  . Difficult or painful urination 01/14/2013  . Malignant neoplasm of prostate ([place]) 01/14/2013  . History of colon polyps 11/01/2010  . 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 Diabetes mellitus type 2, uncontrolled ([place]) 08/31/2008  . Diabetes mellitus with renal complications ([place]) [id number]  . Benign prostatic hypertrophy without urinary obstruction 07/31/2008  . Coronary artery disease 07/31/2008  . Disturbance of skin sensation 07/26/2008  . Essential (primary) hypertension 07/26/2008  . Idiopathic peripheral autonomic neuropathy 07/26/2008  Past Surgical History:  Procedure Laterality Date  . APPENDECTOMY  many yrs ago  . CARDIAC CATHETERIZATION  12-06-2003  . CARDIAC CATHETERIZATION N/A 06/04/2015   Procedure: Left Heart Cath and Coronary Angiography;  Surgeon: Corey Skains, MD;  Location: [place] [facility];  Service: Cardiovascular;  Laterality: N/A;  . CARDIAC CATHETERIZATION N/A 06/04/2015   Procedure: Coronary Stent Intervention;  Surgeon: Wellington Hampshire, MD;  Location: [place];  Service: Cardiovascular;  Laterality: N/A;  . HERNIA REPAIR  yrs ago  . lad stent  2000 x1 stent, 2008 x 1 stent, 2012 x 1 stent  . PERIPHERAL VASCULAR CATHETERIZATION N/A 09/25/2015   Procedure: IVC Filter Insertion;  Surgeon: Katha Cabal, MD;  Location: [facility];  Service: Cardiovascular;  Laterality: N/A;  . protatectomy and urostomy  2014  . ROTATOR CUFF REPAIR Left yrs ago  . seed implant to prostate with radiation  2012  . TOTAL HIP ARTHROPLASTY Right 12/27/2013   Procedure: RIGHT TOTAL HIP ARTHROPLASTY ANTERIOR APPROACH;  Surgeon: Mauri Pole, MD;  Location: [facility];  Service: Orthopedics;  Laterality: Right;    Prior to Admission medications   Medication Sig Start Date End Date Taking? Authorizing Provider  apixaban (ELIQUIS) 5 MG TABS tablet Take 5 mg by mouth 2 (two) times daily.     Historical Provider, MD  aspirin EC 81 MG tablet Take 81 mg by mouth daily.    Historical Provider, MD  atenolol (TENORMIN) 25 MG tablet TAKE 1 TABLET(S) BY MOUTH DAILY 04/12/16   Birdie Sons, MD  feeding supplement, ENSURE ENLIVE, (ENSURE ENLIVE) LIQD Take 237 mLs by mouth 3 (three) times daily between meals. 04/09/16   Srikar Sudini, MD  fentaNYL (DURAGESIC - DOSED MCG/HR) 100 MCG/HR Place 1 patch (100 mcg total) onto the skin every 3 (three) days. 06/17/16   Lloyd Huger, MD  furosemide (LASIX) 20 MG tablet Take 20 mg by mouth daily.     Historical Provider, MD  glipiZIDE (GLUCOTROL) 10 MG tablet Take 5 mg by mouth daily.     Historical Provider, MD  megestrol (MEGACE ES) 625 MG/5ML suspension Take 625 mg by mouth daily.  04/02/16   Historical Provider, MD  metFORMIN (GLUCOPHAGE-XR) 500 MG 24 hr tablet Take 500 mg by mouth daily.  03/23/16    Historical Provider, MD  ONE TOUCH ULTRA TEST test strip  03/05/16   Historical Provider, MD  pantoprazole (PROTONIX) 40 MG tablet Take 40 mg by mouth 2 (two) times daily.     Historical Provider, MD  potassium chloride (K-DUR) 10 MEQ tablet Take 10 mEq by mouth daily.  04/02/16   Historical Provider, MD  sotalol (BETAPACE) 80 MG tablet Take 80 mg by mouth daily.     Historical Provider, MD  voriconazole (VFEND) 200 MG tablet Take 200 mg by mouth 2 (two) times daily.    Historical Provider, MD    Allergies Celebrex [celecoxib] and Effient [prasugrel]  Family History  Problem Relation Age of Onset  . Stroke Father 35  . Aneurysm Father   . Heart attack Mother   . Lung cancer Other   . Stomach cancer Other     Social History Social History  Substance Use Topics  . Smoking status: Never Smoker  . Smokeless tobacco: Never Used  . Alcohol use No    Review of Systems  Constitutional: No fever/chills. Eyes: No visual changes. ENT: No sore throat. Cardiovascular: Positive for chest pain. Respiratory:  Positive for shortness of breath. Gastrointestinal: No abdominal pain.  No nausea, no vomiting.  No diarrhea.  No constipation. Genitourinary: Negative for dysuria. Musculoskeletal: Negative for back pain. Skin: Negative for rash. Neurological: Negative for headaches, focal weakness or numbness.  10-point ROS otherwise negative.  ____________________________________________   PHYSICAL EXAM:  VITAL SIGNS: ED Triage Vitals  Enc Vitals Group     BP 07/04/16 0001 119/79     Pulse Rate 07/04/16 0001 81     Resp 07/04/16 0001 (!) 26     Temp 07/04/16 0001 98.5 F (36.9 C)     Temp src --      SpO2 07/04/16 0001 99 %     Weight 07/04/16 0002 165 lb (74.8 kg)     Height 07/04/16 0002 5\' 11"  (1.803 m)     Head Circumference --      Peak Flow --      Pain Score 07/04/16 0003 5     Pain Loc --      Pain Edu? --      Excl. in [place]? --     Constitutional: Alert and oriented.  Chronically ill appearing and in mild acute distress. Eyes: Conjunctivae are normal. PERRL. EOMI. Head: Atraumatic. Nose: No congestion/rhinnorhea. Mouth/Throat: Mucous membranes are moist.  Oropharynx non-erythematous. Neck: No stridor.  No carotid bruits. Supple neck without meningismus. Cardiovascular: Normal rate, regular rhythm. Grossly normal heart sounds.  Good peripheral circulation. Respiratory: Normal respiratory effort.  No retractions. Lungs CTAB. Gastrointestinal: Soft and nontender. No distention. No abdominal bruits. No CVA tenderness. Musculoskeletal: No lower extremity tenderness nor edema.  No joint effusions. Neurologic:  Normal speech and language. No gross focal neurologic deficits are appreciated.  Skin:  Skin is warm, dry and intact. No rash noted. Psychiatric: Mood and affect are normal. Speech and behavior are normal.  ____________________________________________   LABS (all labs ordered are listed, but only abnormal results are displayed)  Labs Reviewed  CBC WITH DIFFERENTIAL/PLATELET - Abnormal; Notable for the following:       Result Value   WBC 12.7 (*)    RBC 3.60 (*)    Hemoglobin 10.5 (*)    HCT 31.6 (*)    RDW 16.8 (*)    Neutro Abs 8.3 (*)    Monocytes Absolute 1.2 (*)    All other components within normal limits  COMPREHENSIVE METABOLIC PANEL - Abnormal; Notable for the following:    Sodium 132 (*)    Chloride 100 (*)    Glucose, Bld 210 (*)    BUN 26 (*)    Creatinine, Ser 1.43 (*)    Calcium 8.7 (*)    Total Protein 5.9 (*)    Albumin 2.6 (*)    ALT 14 (*)    Alkaline Phosphatase 129 (*)    GFR calc non Af Amer 47 (*)    GFR calc Af Amer 55 (*)    All other components within normal limits  TROPONIN I - Abnormal; Notable for the following:    Troponin I 0.09 (*)    All other components within normal limits  LIPASE, BLOOD   ____________________________________________  EKG  ED ECG REPORT I, Alan Drummer J, the attending physician,  personally viewed and interpreted this ECG.   Date: 07/04/2016  EKG Time: 0004  Rate: 79  Rhythm: normal EKG, normal sinus rhythm  Axis: Normal  Intervals:none  ST&T Change: Nonspecific  ____________________________________________  RADIOLOGY  Portable chest x-ray (viewed by me, interpreted per Dr. Radene Knee): Small to  moderate right-sided pleural effusion noted. Left midlung nodule again noted, as on prior PET/CT. 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 ____________________________________________   PROCEDURES  Procedure(s) performed: None  Procedures  Critical Care performed: Yes, see critical care note(s)  CRITICAL CARE Performed by: Paulette Blanch   Total critical care time: 30 minutes  Critical care time was exclusive of separately billable procedures and treating other patients.  Critical care was necessary to treat or prevent imminent or life-threatening deterioration.  Critical care was time spent personally by me on the following activities: development of treatment plan with patient and/or surrogate as well as nursing, discussions with consultants, evaluation of patient's response to treatment, examination of patient, obtaining history from patient or surrogate, ordering and performing treatments and interventions, ordering and review of laboratory studies, ordering and review of radiographic studies, pulse oximetry and re-evaluation of patient's condition. ____________________________________________   INITIAL IMPRESSION / ASSESSMENT AND PLAN / ED COURSE  Pertinent labs & imaging results that were available during my care of the patient were reviewed by me and considered in my medical decision making (see chart for details).  75 year old male on hospice secondary to metastatic urothelial cancer also with history of MI status post stents who presents with chest pain which was partially relieved with 3 nitroglycerin taken prior  to arrival. Patient rates his pain as 2/10 currently. Will apply nitroglycerin paste, administer aspirin and reassess.  Clinical Course  Comment By Time  Morphine ordered for 2/10 chest pain. Initial troponin elevated at 0.09. Discussed with hospitalist who will evaluate patient in the emergency department for admission. Will initiate heparin bolus and drip. Paulette Blanch, MD 08/18 0208     ____________________________________________   FINAL CLINICAL IMPRESSION(S) / ED DIAGNOSES  Final diagnoses:  NSTEMI (non-ST elevated myocardial infarction) ([place])  Unstable angina ([place])  Chest pain, unspecified chest pain type      NEW MEDICATIONS STARTED DURING THIS VISIT:  New Prescriptions   No medications on file     Note:  This document was prepared using Dragon voice recognition software and may include unintentional dictation errors.    Paulette Blanch, MD 07/04/16 [phone]

## 2016-07-04 NOTE — ED Notes (Signed)
Pt's pain is from his cancer. Pt stating that it is primarily in his penis. Wife is at bedside at this time.

## 2016-07-04 NOTE — H&P (Signed)
[facility] [place] NAME: Jiair Alexander    MR#:  [id number]  DATE OF BIRTH:  12/09/1940  DATE OF ADMISSION:  07/03/2016  PRIMARY CARE PHYSICIAN: Lelon Huh, MD   REQUESTING/REFERRING PHYSICIAN:   CHIEF COMPLAINT:   Chief Complaint  Patient presents with  . Chest Pain    HISTORY OF PRESENT ILLNESS: Louis Alexander  is a 75 y.o. male with a known history of Bladder cancer, prostate cancer, coronary artery disease, hypertension, hyperlipidemia presented to the emergency room with chest pain since last night. The chest pain started around 10 PM last night. The pain was located in the left side of the chest and sharp in nature. The chest pain was 10 out of 10 on a scale of 1-10. Pain was nonradiating. Patient has history of coronary artery disease and prior myocardial infarction. Patient currently on oral eliquis  as an outpatient. Workup in the emergency room showed elevated troponin and hospitalist service was consulted for further care of the patient. Patient started on IV heparin drip for anticoagulation. Patient follows up with Dr. Chancy Milroy cardiologist in the community. No complaints of any shortness of breath. No history of orthopnea. No complaints of any headache, dizziness and blurry vision. No history of any syncope. Patient has a blister to the left foot area for which he was being treated as outpatient and he gets a dressing daily.  PAST MEDICAL HISTORY:   Past Medical History:  Diagnosis Date  . Abnormal EKG   . Arthritis    oa  . Bladder cancer ([place])   . Cancer [facility])    bladder and prostate  . Cancer (HCC)    bladder, penis, bone, lung ([place])  . Coronary artery disease   . Diabetes mellitus without complication ([place])   . History of chemotherapy 2014  . History of chicken pox   . History of radiation therapy 2012  . Hyperlipidemia   . Hypertension   . Myocardial infarction ([place]) 2008 and 2012   x 2  . Obesity   .  Sleep apnea    could not tolerate cpap mask    PAST SURGICAL HISTORY: Past Surgical History:  Procedure Laterality Date  . APPENDECTOMY  many yrs ago  . CARDIAC CATHETERIZATION  12-06-2003  . CARDIAC CATHETERIZATION N/A 06/04/2015   Procedure: Left Heart Cath and Coronary Angiography;  Surgeon: Corey Skains, MD;  Location: [place] [facility];  Service: Cardiovascular;  Laterality: N/A;  . CARDIAC CATHETERIZATION N/A 06/04/2015   Procedure: Coronary Stent Intervention;  Surgeon: Wellington Hampshire, MD;  Location: [place] [facility];  Service: Cardiovascular;  Laterality: N/A;  . HERNIA REPAIR  yrs ago  . lad stent  2000 x1 stent, 2008 x 1 stent, 2012 x 1 stent  . PERIPHERAL VASCULAR CATHETERIZATION N/A 09/25/2015   Procedure: IVC Filter Insertion;  Surgeon: Katha Cabal, MD;  Location: [place] [facility];  Service: Cardiovascular;  Laterality: N/A;  . protatectomy and urostomy  2014  . ROTATOR CUFF REPAIR Left yrs ago  . seed implant to prostate with radiation  2012  . TOTAL HIP ARTHROPLASTY Right 12/27/2013   Procedure: RIGHT TOTAL HIP ARTHROPLASTY ANTERIOR APPROACH;  Surgeon: Mauri Pole, MD;  Location: [facility];  Service: Orthopedics;  Laterality: Right;    SOCIAL HISTORY:  Social History  Substance Use Topics  . Smoking status: Never Smoker  . Smokeless tobacco: Never Used  . Alcohol use No  FAMILY HISTORY:  Family History  Problem Relation Age of Onset  . Stroke Father 46  . Aneurysm Father   . Heart attack Mother   . Lung cancer Other   . Stomach cancer Other     DRUG ALLERGIES:  Allergies  Allergen Reactions  . Celebrex [Celecoxib] Other (See Comments)    Hematuria Other reaction(s): Bleeding Bleeding in bladder  . Effient [Prasugrel] Other (See Comments)    blood clots, blood in urine Other reaction(s): Bleeding Bleeding in bladder    REVIEW OF SYSTEMS:   CONSTITUTIONAL: No fever,has weakness.  EYES: No blurred or double vision.  EARS, NOSE,  AND THROAT: No tinnitus or ear pain.  RESPIRATORY: No cough, shortness of breath, wheezing or hemoptysis.  CARDIOVASCULAR: Has chest pain,  No orthopnea, edema.  GASTROINTESTINAL: No nausea, vomiting, diarrhea or abdominal pain.  GENITOURINARY: No dysuria, hematuria.  ENDOCRINE: No polyuria, nocturia,  HEMATOLOGY: No anemia, easy bruising or bleeding SKIN: No rash or lesion. MUSCULOSKELETAL: No joint pain or arthritis.  Has bandage to left foot for blister NEUROLOGIC: No tingling, numbness, weakness.  PSYCHIATRY: No anxiety or depression.   MEDICATIONS AT HOME:  Prior to Admission medications   Medication Sig Start Date End Date Taking? Authorizing Provider  apixaban (ELIQUIS) 5 MG TABS tablet Take 5 mg by mouth 2 (two) times daily.     Historical Provider, MD  aspirin EC 81 MG tablet Take 81 mg by mouth daily.    Historical Provider, MD  atenolol (TENORMIN) 25 MG tablet TAKE 1 TABLET(S) BY MOUTH DAILY 04/12/16   Birdie Sons, MD  feeding supplement, ENSURE ENLIVE, (ENSURE ENLIVE) LIQD Take 237 mLs by mouth 3 (three) times daily between meals. 04/09/16   Srikar Sudini, MD  fentaNYL (DURAGESIC - DOSED MCG/HR) 100 MCG/HR Place 1 patch (100 mcg total) onto the skin every 3 (three) days. 06/17/16   Lloyd Huger, MD  furosemide (LASIX) 20 MG tablet Take 20 mg by mouth daily.     Historical Provider, MD  glipiZIDE (GLUCOTROL) 10 MG tablet Take 5 mg by mouth daily.     Historical Provider, MD  megestrol (MEGACE ES) 625 MG/5ML suspension Take 625 mg by mouth daily.  04/02/16   Historical Provider, MD  metFORMIN (GLUCOPHAGE-XR) 500 MG 24 hr tablet Take 500 mg by mouth daily.  03/23/16   Historical Provider, MD  ONE TOUCH ULTRA TEST test strip  03/05/16   Historical Provider, MD  pantoprazole (PROTONIX) 40 MG tablet Take 40 mg by mouth 2 (two) times daily.     Historical Provider, MD  potassium chloride (K-DUR) 10 MEQ tablet Take 10 mEq by mouth daily.  04/02/16   Historical Provider, MD  sotalol  (BETAPACE) 80 MG tablet Take 80 mg by mouth daily.     Historical Provider, MD  voriconazole (VFEND) 200 MG tablet Take 200 mg by mouth 2 (two) times daily.    Historical Provider, MD      PHYSICAL EXAMINATION:   VITAL SIGNS: Blood pressure 121/80, pulse 78, temperature 98.5 F (36.9 C), resp. rate 14, height 5\' 11"  (1.803 m), weight 74.8 kg (165 lb), SpO2 100 %.  GENERAL:  75 y.o.-year-old patient lying in the bed with no acute distress on oxygen via nasal canula  EYES: Pupils equal, round, reactive to light and accommodation. No scleral icterus. Extraocular muscles intact.  HEENT: Head atraumatic, normocephalic. Oropharynx and nasopharynx clear.  NECK:  Supple, no jugular venous distention. No thyroid enlargement, no tenderness.  LUNGS: Normal  breath sounds bilaterally, no wheezing, rales,rhonchi or crepitation. No use of accessory muscles of respiration. No dullness to percussion  CARDIOVASCULAR: S1, S2 normal. No murmurs, rubs, or gallops.  ABDOMEN: Soft, nontender, nondistended. Bowel sounds present. No organomegaly or mass.  Has ostomy and urosac bag. EXTREMITIES: No pedal edema, cyanosis, or clubbing. Left foot bandage noted. NEUROLOGIC: Cranial nerves II through XII are intact. Muscle strength 5/5 in all extremities. Sensation intact. Gait not checked.  PSYCHIATRIC: The patient is alert and oriented x 3.  SKIN: No obvious rash over skin  LABORATORY PANEL:   CBC  Recent Labs Lab 07/04/16 0051  WBC 12.7*  HGB 10.5*  HCT 31.6*  PLT 339  MCV 87.7  MCH 29.3  MCHC 33.4  RDW 16.8*  LYMPHSABS 2.7  MONOABS 1.2*  EOSABS 0.4  BASOSABS 0.1   ------------------------------------------------------------------------------------------------------------------  Chemistries   Recent Labs Lab 07/04/16 0051  NA 132*  K 4.1  CL 100*  CO2 23  GLUCOSE 210*  BUN 26*  CREATININE 1.43*  CALCIUM 8.7*  AST 28  ALT 14*  ALKPHOS 129*  BILITOT 0.4    ------------------------------------------------------------------------------------------------------------------ estimated creatinine clearance is 48.7 mL/min (by C-G formula based on SCr of 1.43 mg/dL). ------------------------------------------------------------------------------------------------------------------ No results for input(s): TSH, T4TOTAL, T3FREE, THYROIDAB in the last 72 hours.  Invalid input(s): FREET3   Coagulation profile  Recent Labs Lab 07/04/16 0051  INR 1.26   ------------------------------------------------------------------------------------------------------------------- No results for input(s): DDIMER in the last 72 hours. -------------------------------------------------------------------------------------------------------------------  Cardiac Enzymes  Recent Labs Lab 07/04/16 0051  TROPONINI 0.09*   ------------------------------------------------------------------------------------------------------------------ Invalid input(s): POCBNP  ---------------------------------------------------------------------------------------------------------------  Urinalysis    Component Value Date/Time   COLORURINE YELLOW (A) 04/08/2016 1428   APPEARANCEUR CLOUDY (A) 04/08/2016 1428   APPEARANCEUR Turbid 04/13/2014 1140   LABSPEC 1.015 04/08/2016 1428   LABSPEC 1.020 04/13/2014 1140   PHURINE 8.0 04/08/2016 1428   GLUCOSEU 50 (A) 04/08/2016 1428   GLUCOSEU 50 mg/dL 04/13/2014 1140   HGBUR 2+ (A) 04/08/2016 1428   BILIRUBINUR NEGATIVE 04/08/2016 1428   BILIRUBINUR Negative 04/13/2014 1140   KETONESUR NEGATIVE 04/08/2016 1428   PROTEINUR 100 (A) 04/08/2016 1428   UROBILINOGEN 0.2 12/21/2013 0821   NITRITE NEGATIVE 04/08/2016 1428   LEUKOCYTESUR 3+ (A) 04/08/2016 1428   LEUKOCYTESUR 2+ 04/13/2014 1140     RADIOLOGY: Dg Chest Port 1 View  Result Date: 07/04/2016 CLINICAL DATA:  Acute onset of generalized chest pain and shortness of breath.  Initial encounter. EXAM: PORTABLE CHEST 1 VIEW COMPARISON:  Chest radiograph performed 04/08/2016, and PET/CT performed 04/24/2016 FINDINGS: The lungs are well-aerated. A small to moderate right-sided pleural effusion is noted. A left midlung nodule is again noted, as on prior PET/CT. 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 The cardiomediastinal silhouette is borderline normal in size. A right-sided chest port is noted ending about the distal SVC. No acute osseous abnormalities are seen. The patient is status post left-sided rotator cuff repair. IMPRESSION: Small to moderate right-sided pleural effusion noted. Left midlung nodule again noted, as on prior PET/CT. 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 Electronically Signed   By: Garald Balding M.D.   On: 07/04/2016 00:43    EKG: Orders placed or performed during the hospital encounter of 07/03/16  . EKG 12-Lead  . EKG 12-Lead    IMPRESSION AND PLAN: 75 year old male patient with history of bladder cancer, prostate cancer, coronary artery disease presented to the emergency room with chest pain. Admitting diagnosis 1. Non-ST elevation  MI 2. Hyponatremia 3. Coronary artery disease 4. Bladder cancer 5. History of prostate cancer Treatment plan Admit patient to telemetry observation bed Start patient on IV heparin drip Cardiology consultation with Dr. Humphrey Rolls Continue oral aspirin Cycle troponin and check echocardiogram When necessary nitroglycerin for chest pain Follow-up sodium level Supportive care. All the records are reviewed and case discussed with ED provider. Management plans discussed with the patient, family and they are in agreement.  CODE STATUS:DNR Code Status History    Date Active Date Inactive Code Status Order ID Comments User Context   04/08/2016  7:41 PM 04/09/2016  5:10 PM Full Code [id number]  Vaughan Basta, MD Inpatient   12/03/2015  2:47  PM 12/04/2015  3:30 AM Full Code [id number]  Sabino Dick, MD HOV   09/25/2015  4:14 PM 09/30/2015  8:44 PM Full Code [id number]  Bettey Costa, MD Inpatient   06/04/2015  9:09 AM 06/05/2015  3:41 PM Full Code [id number]  Wellington Hampshire, MD Inpatient   06/01/2015  2:29 AM 06/02/2015  3:53 PM Full Code [id number]  Lytle Butte, MD ED   12/27/2013  5:25 PM 12/28/2013  9:57 PM Full Code [id number]  Lucille Passy Babish, PA-C Inpatient       TOTAL TIME TAKING CARE OF THIS PATIENT: 50 minutes.    Saundra Shelling M.D on 07/04/2016 at 2:40 AM  Between 7am to 6pm - Pager - [phone]  After 6pm go to [email or web address] - password [email or web address] [facility]  Office  [phone]  CC: Primary care physician; Lelon Huh, MD

## 2016-07-04 NOTE — ED Notes (Signed)
Pt stating that pain is getting better. Pt stating that when he had his last MI that the pain was different than what he was experiencing earlier tonight.

## 2016-07-04 NOTE — Progress Notes (Signed)
Louis Alexander is a 75 y.o. male  [id number]  Primary Cardiologist: Neoma Laming Reason for Consultation: Chest pain  HPI: This is a 75 year old white male with past medical history of metastatic bladder cancer as well as prostate cancer now in hospice care. Patient presented yesterday with severe 10 out of 10 chest pain associated with shortness of breath and diaphoresis. Patient states the chest pain was very sharp and had to take 3 nitroglycerin. He recently stopped taking aspirin as is on Eliquis and started having hematuria. He also had stopped taking Plavix few months back. He says that the nurse recommended to stop the aspirin and has been on aspirin for 3 days.   Review of Systems: No orthopnea PND or leg swelling.   Past Medical History:  Diagnosis Date  . Abnormal EKG   . Arthritis    oa  . Bladder cancer ([place])   . Cancer [facility])    bladder and prostate  . Cancer (HCC)    bladder, penis, bone, lung ([place])  . Coronary artery disease   . Diabetes mellitus without complication ([place])   . History of chemotherapy 2014  . History of chicken pox   . History of radiation therapy 2012  . Hyperlipidemia   . Hypertension   . Myocardial infarction ([place]) 2008 and 2012   x 2  . Obesity   . Sleep apnea    could not tolerate cpap mask    Medications Prior to Admission  Medication Sig Dispense Refill  . apixaban (ELIQUIS) 5 MG TABS tablet Take 5 mg by mouth 2 (two) times daily.     [name] atenolol (TENORMIN) 25 MG tablet TAKE 1 TABLET(S) BY MOUTH DAILY 90 tablet 3  . feeding supplement, ENSURE ENLIVE, (ENSURE ENLIVE) LIQD Take 237 mLs by mouth 3 (three) times daily between meals. 90 Bottle 0  . fentaNYL (DURAGESIC - DOSED MCG/HR) 100 MCG/HR Place 1 patch (100 mcg total) onto the skin every 3 (three) days. 5 patch 0  . furosemide (LASIX) 20 MG tablet Take 20 mg by mouth daily.     [name] glipiZIDE (GLUCOTROL) 10 MG tablet Take 5 mg by mouth daily.     . metFORMIN (GLUCOPHAGE-XR) 500 MG  24 hr tablet Take 500 mg by mouth daily.     . ONE TOUCH ULTRA TEST test strip     . pantoprazole (PROTONIX) 40 MG tablet Take 40 mg by mouth daily.     . potassium chloride (K-DUR) 10 MEQ tablet Take 10 mEq by mouth daily.     . sotalol (BETAPACE) 80 MG tablet Take 80 mg by mouth daily.        [name] aspirin  324 mg Oral NOW   Or  . aspirin  300 mg Rectal NOW  . aspirin EC  81 mg Oral Daily  . [START ON 07/05/2016] aspirin EC  81 mg Oral Daily  . atenolol  25 mg Oral BID  . feeding supplement (ENSURE ENLIVE)  237 mL Oral TID BM  . [START ON 07/06/2016] fentaNYL  100 mcg Transdermal Q72H  . furosemide  20 mg Oral Daily  . insulin aspart  0-5 Units Subcutaneous QHS  . insulin aspart  0-9 Units Subcutaneous TID WC  . megestrol  800 mg Oral Daily  . pantoprazole  40 mg Oral BID AC  . potassium chloride  10 mEq Oral Daily  . sodium chloride flush  3 mL Intravenous Q12H  . sotalol  80 mg Oral BID  Infusions: . heparin 900 Units/hr (07/04/16 0237)    Allergies  Allergen Reactions  . Celebrex [Celecoxib] Other (See Comments)    Hematuria Other reaction(s): Bleeding Bleeding in bladder  . Effient [Prasugrel] Other (See Comments)    blood clots, blood in urine Other reaction(s): Bleeding Bleeding in bladder    Social History   Social History  . Marital status: Married    Spouse name: N/A  . Number of children: 3  . Years of education: N/A   Occupational History  . Retired     previously Worked in Research officer, political party at [employer]  . Smoking status: Never Smoker  . Smokeless tobacco: Never Used  . Alcohol use No  . Drug use: No  . Sexual activity: Not on file   Other Topics Concern  . Not on file   Social History Narrative  . No narrative on file    Family History  Problem Relation Age of Onset  . Stroke Father 82  . Aneurysm Father   . Heart attack Mother   . Lung cancer Other   . Stomach cancer Other     PHYSICAL EXAM: Vitals:    07/04/16 0338 07/04/16 0810  BP: 138/70 135/76  Pulse: 75 80  Resp: 18 17  Temp: 98.8 F (37.1 C) 98.3 F (36.8 C)     Intake/Output Summary (Last 24 hours) at 07/04/16 [id number] Last data filed at 07/04/16 0400  Gross per 24 hour  Intake                0 ml  Output              400 ml  Net             -400 ml    General:  Well appearing. No respiratory difficulty HEENT: normal Neck: supple. no JVD. Carotids 2+ bilat; no bruits. No lymphadenopathy or thryomegaly appreciated. Cor: PMI nondisplaced. Regular rate & rhythm. No rubs, gallops or murmurs. Lungs: clear Abdomen: soft, nontender, nondistended. No hepatosplenomegaly. No bruits or masses. Good bowel sounds. Extremities: no cyanosis, clubbing, rash, edema Neuro: alert & oriented x 3, cranial nerves grossly intact. moves all 4 extremities w/o difficulty. Affect pleasant.  [id number] sinus rhythm with nonspecific ST-T changes  Results for orders placed or performed during the hospital encounter of 07/03/16 (from the past 24 hour(s))  CBC with Differential     Status: Abnormal   Collection Time: 07/04/16 12:51 AM  Result Value Ref Range   WBC 12.7 (H) 3.8 - 10.6 K/uL   RBC 3.60 (L) 4.40 - 5.90 MIL/uL   Hemoglobin 10.5 (L) 13.0 - 18.0 g/dL   HCT 31.6 (L) 40.0 - 52.0 %   MCV 87.7 80.0 - 100.0 fL   MCH 29.3 26.0 - 34.0 pg   MCHC 33.4 32.0 - 36.0 g/dL   RDW 16.8 (H) 11.5 - 14.5 %   Platelets 339 150 - 440 K/uL   Neutrophils Relative % 65 %   Neutro Abs 8.3 (H) 1.4 - 6.5 K/uL   Lymphocytes Relative 21 %   Lymphs Abs 2.7 1.0 - 3.6 K/uL   Monocytes Relative 10 %   Monocytes Absolute 1.2 (H) 0.2 - 1.0 K/uL   Eosinophils Relative 3 %   Eosinophils Absolute 0.4 0 - 0.7 K/uL   Basophils Relative 1 %   Basophils Absolute 0.1 0 - 0.1 K/uL  Comprehensive metabolic panel     Status: Abnormal   Collection  Time: 07/04/16 12:51 AM  Result Value Ref Range   Sodium 132 (L) 135 - 145 mmol/L   Potassium 4.1 3.5 - 5.1 mmol/L   Chloride  100 (L) 101 - 111 mmol/L   CO2 23 22 - 32 mmol/L   Glucose, Bld 210 (H) 65 - 99 mg/dL   BUN 26 (H) 6 - 20 mg/dL   Creatinine, Ser 1.43 (H) 0.61 - 1.24 mg/dL   Calcium 8.7 (L) 8.9 - 10.3 mg/dL   Total Protein 5.9 (L) 6.5 - 8.1 g/dL   Albumin 2.6 (L) 3.5 - 5.0 g/dL   AST 28 15 - 41 U/L   ALT 14 (L) 17 - 63 U/L   Alkaline Phosphatase 129 (H) 38 - 126 U/L   Total Bilirubin 0.4 0.3 - 1.2 mg/dL   GFR calc non Af Amer 47 (L) >60 mL/min   GFR calc Af Amer 55 (L) >60 mL/min   Anion gap 9 5 - 15  Lipase, blood     Status: None   Collection Time: 07/04/16 12:51 AM  Result Value Ref Range   Lipase 16 11 - 51 U/L  Troponin I     Status: Abnormal   Collection Time: 07/04/16 12:51 AM  Result Value Ref Range   Troponin I 0.09 (HH) <0.03 ng/mL  APTT     Status: Abnormal   Collection Time: 07/04/16 12:51 AM  Result Value Ref Range   aPTT 39 (H) 24 - 36 seconds  Protime-INR     Status: Abnormal   Collection Time: 07/04/16 12:51 AM  Result Value Ref Range   Prothrombin Time 15.9 (H) 11.4 - 15.2 seconds   INR 1.26   Heparin level (unfractionated)     Status: Abnormal   Collection Time: 07/04/16 12:51 AM  Result Value Ref Range   Heparin Unfractionated >3.60 (H) 0.30 - 0.70 IU/mL  Lipid panel     Status: Abnormal   Collection Time: 07/04/16  5:13 AM  Result Value Ref Range   Cholesterol 129 0 - 200 mg/dL   Triglycerides 140 <150 mg/dL   HDL 26 (L) >40 mg/dL   Total CHOL/HDL Ratio 5.0 RATIO   VLDL 28 0 - 40 mg/dL   LDL Cholesterol 75 0 - 99 mg/dL  Basic metabolic panel     Status: Abnormal   Collection Time: 07/04/16  5:13 AM  Result Value Ref Range   Sodium 134 (L) 135 - 145 mmol/L   Potassium 3.8 3.5 - 5.1 mmol/L   Chloride 103 101 - 111 mmol/L   CO2 23 22 - 32 mmol/L   Glucose, Bld 103 (H) 65 - 99 mg/dL   BUN 24 (H) 6 - 20 mg/dL   Creatinine, Ser 1.24 0.61 - 1.24 mg/dL   Calcium 8.5 (L) 8.9 - 10.3 mg/dL   GFR calc non Af Amer 56 (L) >60 mL/min   GFR calc Af Amer >60 >60 mL/min    Anion gap 8 5 - 15  CBC     Status: Abnormal   Collection Time: 07/04/16  5:13 AM  Result Value Ref Range   WBC 11.9 (H) 3.8 - 10.6 K/uL   RBC 3.25 (L) 4.40 - 5.90 MIL/uL   Hemoglobin 9.7 (L) 13.0 - 18.0 g/dL   HCT 28.2 (L) 40.0 - 52.0 %   MCV 86.8 80.0 - 100.0 fL   MCH 29.8 26.0 - 34.0 pg   MCHC 34.3 32.0 - 36.0 g/dL   RDW 16.8 (H) 11.5 - 14.5 %  Platelets 317 150 - 440 K/uL  Troponin I     Status: Abnormal   Collection Time: 07/04/16  5:13 AM  Result Value Ref Range   Troponin I 0.19 (HH) <0.03 ng/mL  Glucose, capillary     Status: None   Collection Time: 07/04/16  8:12 AM  Result Value Ref Range   Glucose-Capillary 84 65 - 99 mg/dL   Comment 1 Notify RN    Dg Chest Port 1 View  Result Date: 07/04/2016 CLINICAL DATA:  Acute onset of generalized chest pain and shortness of breath. Initial encounter. EXAM: PORTABLE CHEST 1 VIEW COMPARISON:  Chest radiograph performed 04/08/2016, and PET/CT performed 04/24/2016 FINDINGS: The lungs are well-aerated. A small to moderate right-sided pleural effusion is noted. A left midlung nodule is again noted, as on prior PET/CT. 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 The cardiomediastinal silhouette is borderline normal in size. A right-sided chest port is noted ending about the distal SVC. No acute osseous abnormalities are seen. The patient is status post left-sided rotator cuff repair. IMPRESSION: Small to moderate right-sided pleural effusion noted. Left midlung nodule again noted, as on prior PET/CT. 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 Electronically Signed   By: Garald Balding M.D.   On: 07/04/2016 00:43    2 ASSESSMENT AND PLAN: Mildly elevated troponin most likely due to non-STEMI with given history of PCI and stenting of his LAD in 2014 and 16. He denies any chest pain at this time and is hospice care. Thus he is not a candidate for invasive procedures such as  catheterization and angioplasty. Patient was given the option and he refused to have cardiac catheterization. Thus will give aspirin and nitrates.  Kynadee Dam A

## 2016-07-04 NOTE — Care Management (Signed)
Patient is to discharge home today and is going to require non emergent ems transport.  [facility] stated that since the hospital stay was not related to his cancer diagnosis, would.  not be covered under his hospice benefit.  Patient is bedbound and not able to travel by car.  Requires prior authorization from [place].  Form being faxed

## 2016-07-04 NOTE — Progress Notes (Signed)
EMS here to transport pt to home.  No distress.  RX given to wife.

## 2016-07-04 NOTE — Progress Notes (Signed)
Called [employer] at [phone] once more to verify EMS transport benefit and spoke with [place].  Reference number for call is [id number].  Per Caryl Ada, EMS transport in general has a $200 copay whether emergent or non-emergent.     Also called the [facility] Management team [phone], option 1 for check status to speak to a live representative) to confirm if auth truly required for service due to confusion of benefits quoted during first call with [employer]. Spoke with [place] there who mentioned they had already received information to start auth, however, they needed to confirm if it needed it on their end and said she would have to call me back.  Once she returned my call, she confirmed pre-certification is not required for non-emergent EMS transport.

## 2016-07-04 NOTE — ED Notes (Signed)
Troponin of 0.09 reported to Dr. Beather Arbour.

## 2016-07-04 NOTE — Progress Notes (Signed)
Spoke with Mickel Baas, [employer] rep at [phone], to confirm non-emergent EMS transport benefit.  Per Mickel Baas, only emergent transport is covered by the plan.  She mentioned if service is needed in non-emergent capacity, it would have to approved first through the [facility] Management department.  Number given to contact them is [phone] opt 1 for check status to speak to a live representative.   Auth not initiated at this time.

## 2016-07-04 NOTE — Care Management Obs Status (Signed)
[facility] NOTIFICATION   Patient Details  Name: Louis Alexander MRN: [id number] Date of Birth: [birth year]   Medicare Observation Status Notification Given:  Yes    Katrina Stack, RN 07/04/2016, 9:54 AM

## 2016-07-04 NOTE — Progress Notes (Signed)
EMS called for non emergent transfer to home

## 2016-07-14 ENCOUNTER — Encounter: Payer: Self-pay | Admitting: *Deleted

## 2016-07-15 ENCOUNTER — Inpatient Hospital Stay: Payer: PPO | Admitting: Family Medicine

## 2016-08-17 DEATH — deceased
